# Patient Record
Sex: Male | Born: 1970 | Race: Black or African American | Hispanic: No | Marital: Married | Smoking: Former smoker
Health system: Southern US, Community
[De-identification: ages and names within clinical notes are randomized; demographics above are authoritative.]

## PROBLEM LIST (undated history)

## (undated) DIAGNOSIS — I2510 Atherosclerotic heart disease of native coronary artery without angina pectoris: Secondary | ICD-10-CM

## (undated) DIAGNOSIS — E119 Type 2 diabetes mellitus without complications: Secondary | ICD-10-CM

## (undated) DIAGNOSIS — I214 Non-ST elevation (NSTEMI) myocardial infarction: Secondary | ICD-10-CM

## (undated) DIAGNOSIS — Z9289 Personal history of other medical treatment: Secondary | ICD-10-CM

## (undated) DIAGNOSIS — I255 Ischemic cardiomyopathy: Secondary | ICD-10-CM

## (undated) DIAGNOSIS — I509 Heart failure, unspecified: Secondary | ICD-10-CM

## (undated) DIAGNOSIS — I209 Angina pectoris, unspecified: Secondary | ICD-10-CM

## (undated) DIAGNOSIS — N183 Chronic kidney disease, stage 3 unspecified: Secondary | ICD-10-CM

## (undated) DIAGNOSIS — D649 Anemia, unspecified: Secondary | ICD-10-CM

## (undated) DIAGNOSIS — M109 Gout, unspecified: Secondary | ICD-10-CM

## (undated) DIAGNOSIS — F32A Depression, unspecified: Secondary | ICD-10-CM

## (undated) DIAGNOSIS — I4510 Unspecified right bundle-branch block: Secondary | ICD-10-CM

## (undated) DIAGNOSIS — E782 Mixed hyperlipidemia: Secondary | ICD-10-CM

## (undated) DIAGNOSIS — R0602 Shortness of breath: Secondary | ICD-10-CM

## (undated) DIAGNOSIS — I10 Essential (primary) hypertension: Secondary | ICD-10-CM

## (undated) DIAGNOSIS — F329 Major depressive disorder, single episode, unspecified: Secondary | ICD-10-CM

## (undated) DIAGNOSIS — I739 Peripheral vascular disease, unspecified: Secondary | ICD-10-CM

## (undated) HISTORY — DX: Type 2 diabetes mellitus without complications: E11.9

## (undated) HISTORY — DX: Essential (primary) hypertension: I10

## (undated) HISTORY — DX: Ischemic cardiomyopathy: I25.5

## (undated) HISTORY — DX: Chronic kidney disease, stage 3 (moderate): N18.3

## (undated) HISTORY — DX: Unspecified right bundle-branch block: I45.10

## (undated) HISTORY — DX: Peripheral vascular disease, unspecified: I73.9

## (undated) HISTORY — DX: Chronic kidney disease, stage 3 unspecified: N18.30

## (undated) HISTORY — DX: Atherosclerotic heart disease of native coronary artery without angina pectoris: I25.10

## (undated) HISTORY — DX: Non-ST elevation (NSTEMI) myocardial infarction: I21.4

## (undated) HISTORY — PX: REFRACTIVE SURGERY: SHX103

## (undated) HISTORY — PX: OTHER SURGICAL HISTORY: SHX169

## (undated) HISTORY — DX: Mixed hyperlipidemia: E78.2

## (undated) HISTORY — PX: EYE SURGERY: SHX253

---
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
## 2003-12-13 ENCOUNTER — Emergency Department (HOSPITAL_COMMUNITY): Admission: EM | Admit: 2003-12-13 | Discharge: 2003-12-14 | Payer: Self-pay | Admitting: Emergency Medicine

## 2004-11-16 ENCOUNTER — Other Ambulatory Visit: Admission: RE | Admit: 2004-11-16 | Discharge: 2004-11-16 | Payer: Self-pay | Admitting: General Surgery

## 2006-11-15 ENCOUNTER — Emergency Department (HOSPITAL_COMMUNITY): Admission: EM | Admit: 2006-11-15 | Discharge: 2006-11-15 | Payer: Self-pay | Admitting: Emergency Medicine

## 2006-11-18 ENCOUNTER — Ambulatory Visit (HOSPITAL_COMMUNITY): Admission: RE | Admit: 2006-11-18 | Discharge: 2006-11-18 | Payer: Self-pay | Admitting: Internal Medicine

## 2006-12-06 ENCOUNTER — Ambulatory Visit (HOSPITAL_COMMUNITY): Admission: RE | Admit: 2006-12-06 | Discharge: 2006-12-06 | Payer: Self-pay | Admitting: Internal Medicine

## 2007-01-02 ENCOUNTER — Ambulatory Visit (HOSPITAL_COMMUNITY): Admission: RE | Admit: 2007-01-02 | Discharge: 2007-01-02 | Payer: Self-pay | Admitting: Internal Medicine

## 2007-04-29 ENCOUNTER — Emergency Department (HOSPITAL_COMMUNITY): Admission: EM | Admit: 2007-04-29 | Discharge: 2007-04-29 | Payer: Self-pay | Admitting: Emergency Medicine

## 2007-06-20 ENCOUNTER — Emergency Department (HOSPITAL_COMMUNITY): Admission: EM | Admit: 2007-06-20 | Discharge: 2007-06-20 | Payer: Self-pay | Admitting: Emergency Medicine

## 2007-09-05 IMAGING — XA IR ANGIO/EXT/BI
1 series · 12 of 24 positions shown · non-contrast
Comparison: none

01/02/07 – DUPLICATE COPY for exam association in [facility] – No change from original report.
CLINICAL DATA: Bilateral lower extremity claudication. CTA demonstrated
 bilateral peripheral arterial occlusive disease including proximal left external
 iliac artery focal stenosis

 Bilateral lower extremity arteriogram:
TECHNIQUE: A left-sided approach was selected. Overlying skin prepped with
 Betadine, draped in usual sterile fashion, infiltrated locally with 1%
 lidocaine. Intravenous fentanyl and Versed were administered as conscious
 sedation during continuous cardiorespiratory monitoring by the radiology RN.
 The left common femoral artery was accessed over the femoral head with a
 21-gauge micropuncture needle under real-time ultrasound guidance. Needle was
 exchanged over a 018 wire for a transitional dilator, which allowed advancement
 of a Benson wire to the aorta. Over this, a 5 French C2 catheter was advanced
 for left lower extremity arteriography. Pressure measurements across the left
 external iliac lesion were obtained. The C2 was then utilized via the Benson
 wire to selectively catheterize the right common iliac artery for right lower
 extremity arteriography. The catheter was then removed and hemostasis achieved.
 Patient tolerated procedure well, with no immediate complication.

[Series 1: run · 12 of 98 slices shown]
[im 5/98]
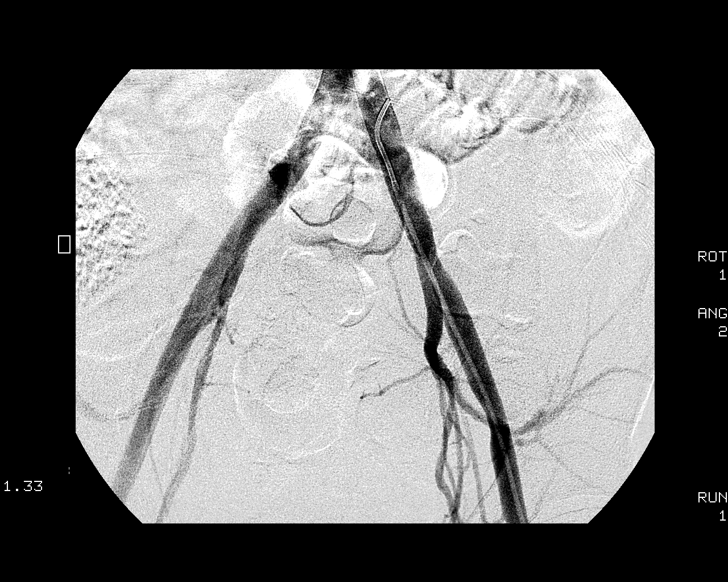
[im 13/98]
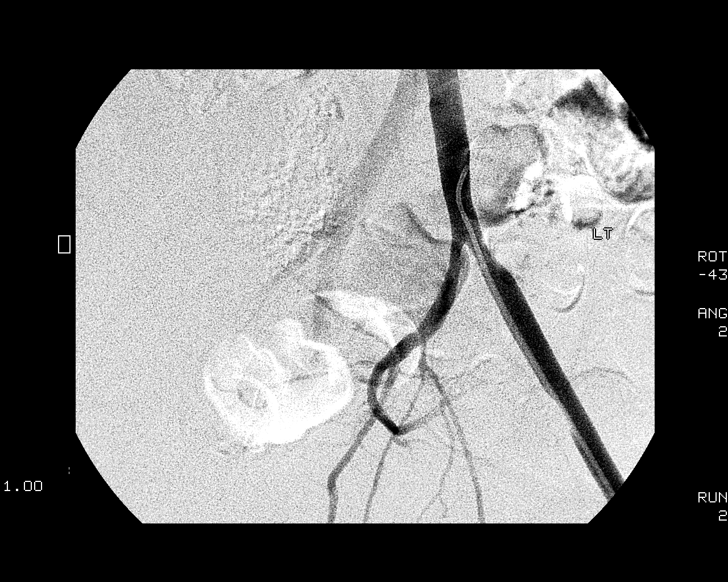
[im 22/98]
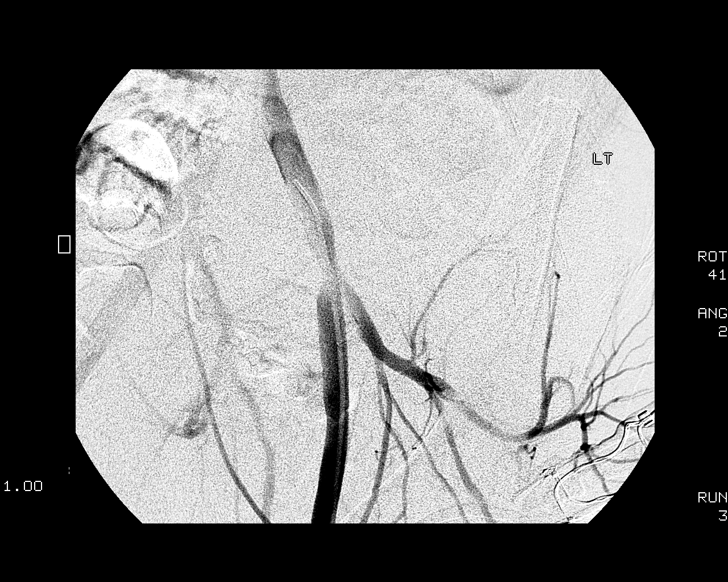
[im 30/98]
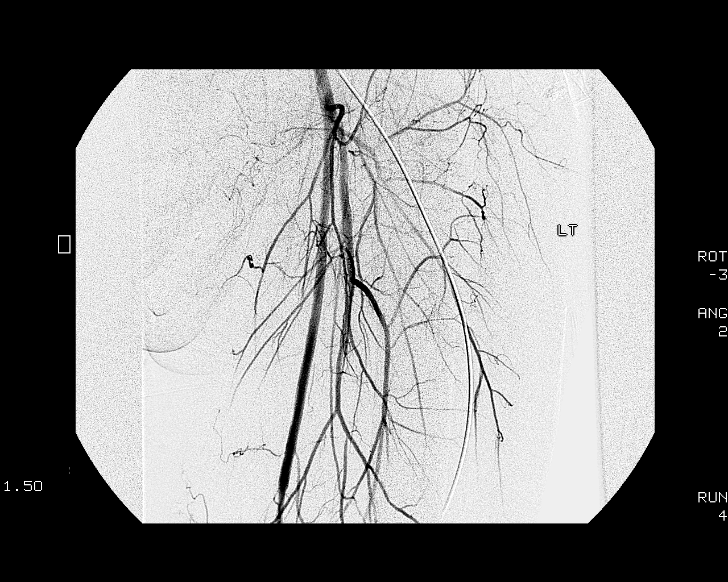
[im 38/98]
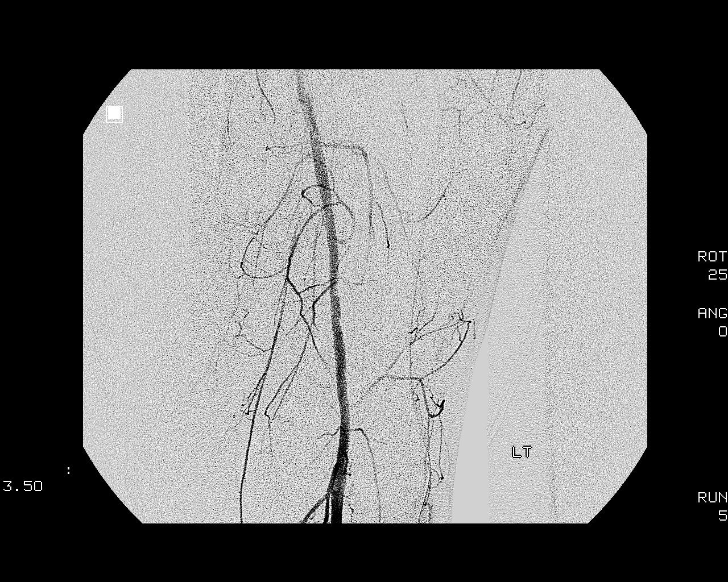
[im 47/98]
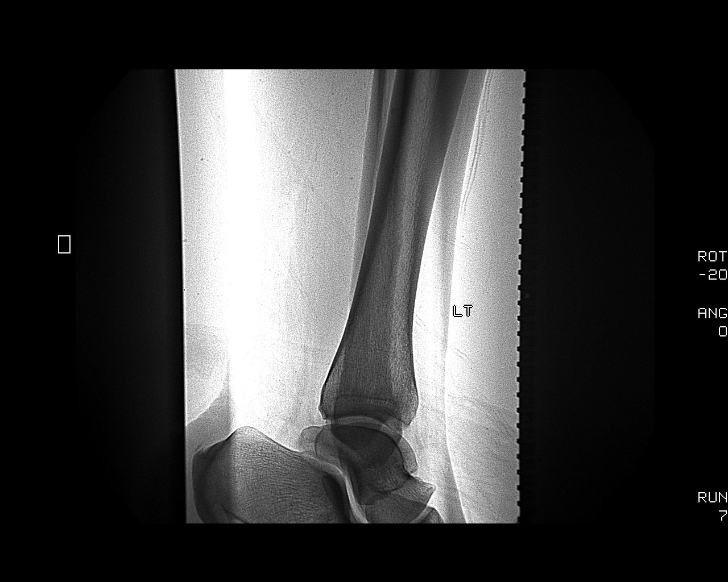
[im 55/98]
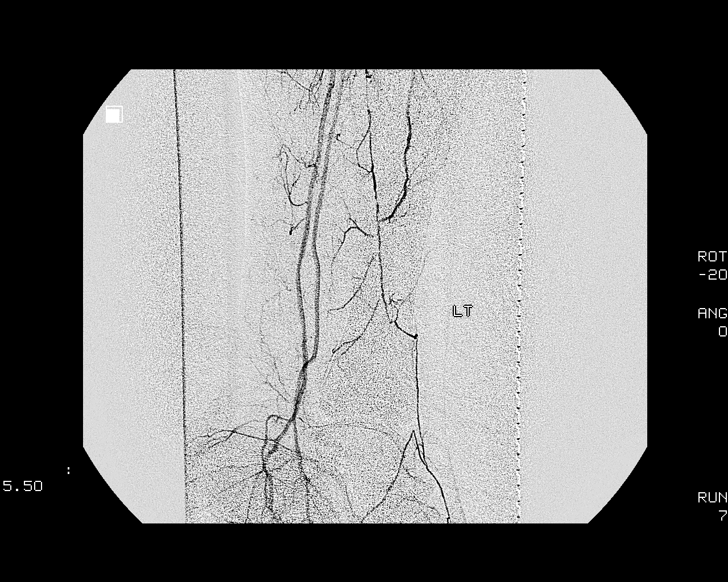
[im 64/98]
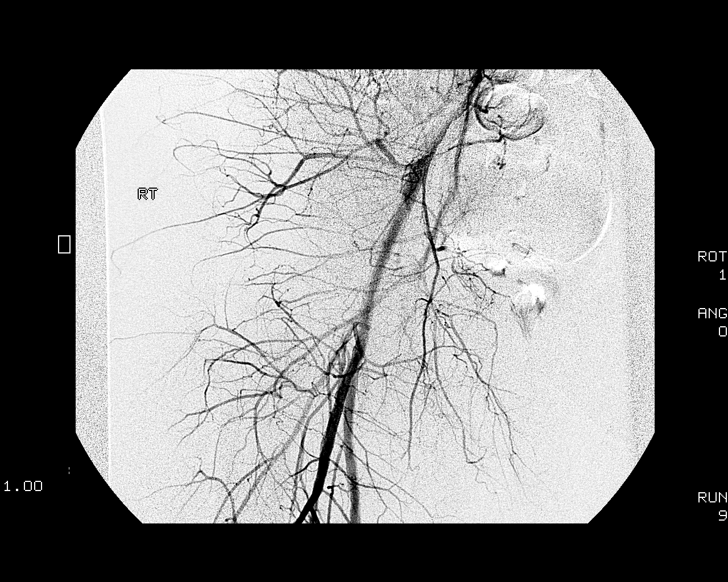
[im 72/98]
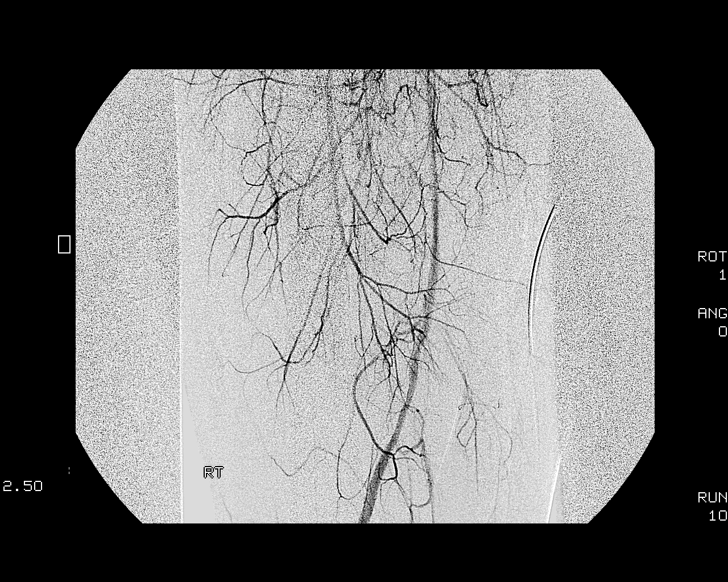
[im 81/98]
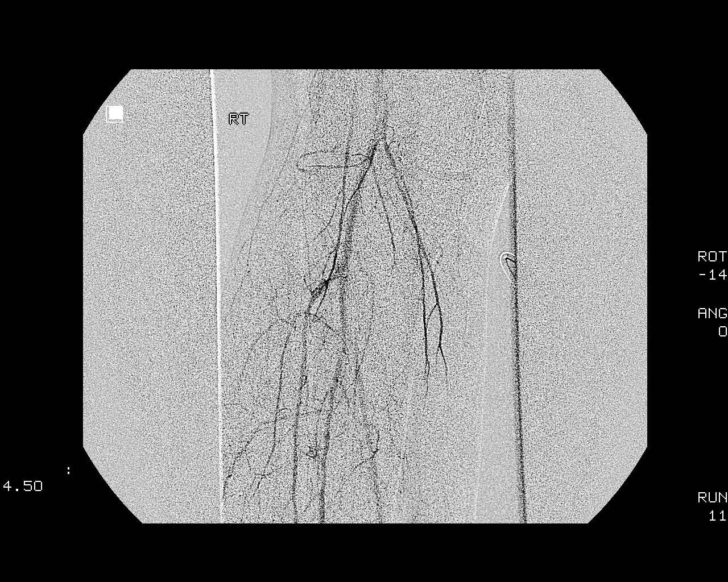
[im 89/98]
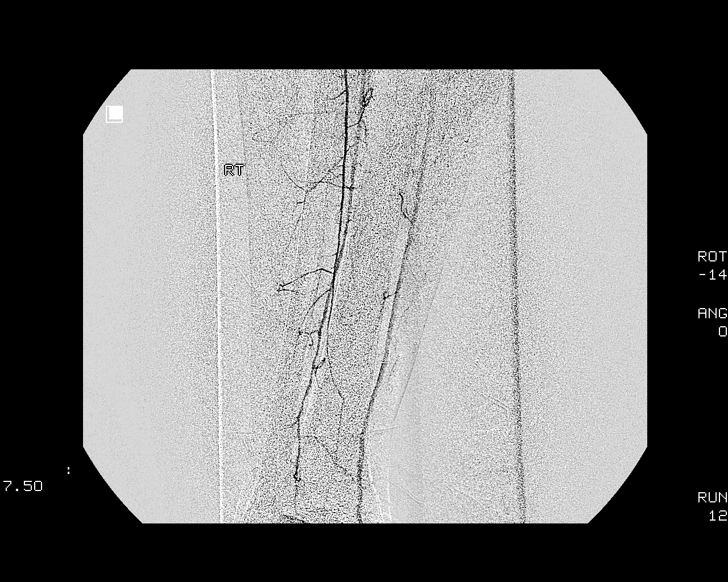
[im 98/98]
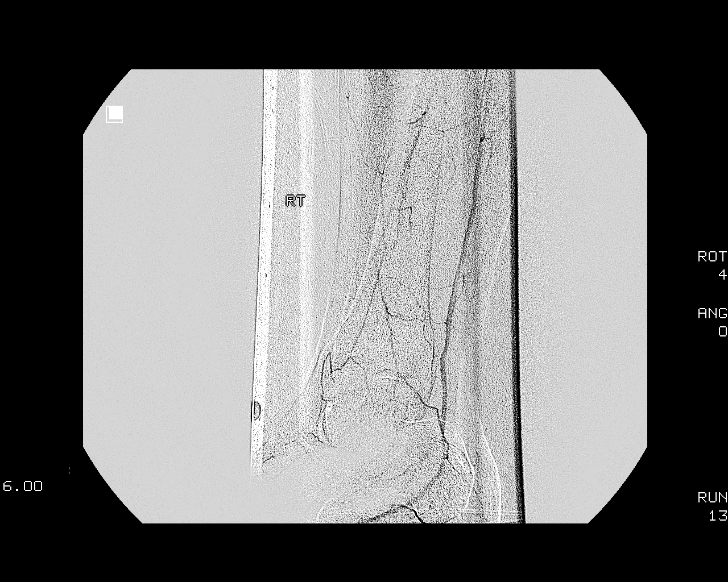

[12 of 24 positions shown; findings below may reference images not displayed]

FINDINGS: On the left, the common iliac artery is unremarkable. There is a 50% diameter
 stenosis at the origin of the left external iliac artery. Pressure
 measurements demonstrate a 3 mmHg systolic pressure gradient. The distal left
 external iliac artery common femoral artery are widely patent. Mild atheromatous
 irregularity in the profunda femoris. There are focal areas of mild narrowing in
 the mid and distal SFA without significant lesion. Popliteal artery is widely
 patent. There is mild atheromatous irregularity in the tibioperoneal trunk and
 proximal peroneal artery. Peroneal and posterior tibial arteries are widely
 patent distally, providing supply across the ankle to the foot. The anterior
 tibial artery is more diminutive, showing atheromatous changes proximally, not
 seen below the lower calf level.

 On the right, common iliac, external iliac, and common femoral arteries are
 widely patent. Mild atheromatous irregularity in the proximal SFA with several
 tandem areas of mild stenosis distally near the adductor hiatus. Popliteal
 artery is widely patent. There is mild diffuse atheromatous irregularity in the
 proximal anterior tibial artery and peroneal artery. The posterior tibial artery
 is widely patent, crossing ankle as the primary blood supply to the foot.
IMPRESSION: 1. Focal left external iliac stenosis with only 3 mmHg systolic pressure
 gradient; intervention is not indicated at this point.
 2. Mild bilateral distal SFA disease without high-grade stenosis.
 3. Diseased bilateral lower extremity tibial runoff with contiguous bilateral
 posterior tibial and left peroneal runoff across the ankles to the feet.

## 2008-01-29 ENCOUNTER — Emergency Department (HOSPITAL_COMMUNITY): Admission: EM | Admit: 2008-01-29 | Discharge: 2008-01-29 | Payer: Self-pay | Admitting: Emergency Medicine

## 2008-02-07 ENCOUNTER — Ambulatory Visit: Payer: Self-pay | Admitting: Cardiology

## 2008-02-22 ENCOUNTER — Encounter: Payer: Self-pay | Admitting: Cardiology

## 2008-02-22 ENCOUNTER — Ambulatory Visit (HOSPITAL_COMMUNITY): Admission: RE | Admit: 2008-02-22 | Discharge: 2008-02-22 | Payer: Self-pay | Admitting: Cardiology

## 2008-02-22 ENCOUNTER — Ambulatory Visit: Payer: Self-pay | Admitting: Cardiology

## 2009-05-27 ENCOUNTER — Ambulatory Visit (HOSPITAL_COMMUNITY): Admission: RE | Admit: 2009-05-27 | Discharge: 2009-05-27 | Payer: Self-pay | Admitting: Internal Medicine

## 2009-07-24 ENCOUNTER — Ambulatory Visit (HOSPITAL_COMMUNITY): Admission: RE | Admit: 2009-07-24 | Discharge: 2009-07-24 | Payer: Self-pay | Admitting: Internal Medicine

## 2009-08-25 ENCOUNTER — Ambulatory Visit (HOSPITAL_COMMUNITY): Admission: RE | Admit: 2009-08-25 | Discharge: 2009-08-25 | Payer: Self-pay | Admitting: Internal Medicine

## 2009-09-02 ENCOUNTER — Encounter (HOSPITAL_COMMUNITY)
Admission: RE | Admit: 2009-09-02 | Discharge: 2009-10-02 | Payer: Self-pay | Source: Home / Self Care | Admitting: Internal Medicine

## 2010-06-17 ENCOUNTER — Other Ambulatory Visit (HOSPITAL_COMMUNITY): Payer: Self-pay | Admitting: *Deleted

## 2010-06-17 DIAGNOSIS — R109 Unspecified abdominal pain: Secondary | ICD-10-CM

## 2010-06-25 ENCOUNTER — Ambulatory Visit (HOSPITAL_COMMUNITY)
Admission: RE | Admit: 2010-06-25 | Discharge: 2010-06-25 | Disposition: A | Discharge status: Home / Self Care | Payer: Self-pay | Source: Ambulatory Visit | Attending: *Deleted | Admitting: *Deleted

## 2010-06-25 ENCOUNTER — Ambulatory Visit (HOSPITAL_COMMUNITY)
Admission: RE | Admit: 2010-06-25 | Discharge: 2010-06-25 | Disposition: A | Discharge status: Home / Self Care | Payer: Self-pay | Source: Ambulatory Visit | Attending: Gastroenterology | Admitting: Gastroenterology

## 2010-06-25 DIAGNOSIS — R109 Unspecified abdominal pain: Secondary | ICD-10-CM

## 2010-06-25 DIAGNOSIS — K824 Cholesterolosis of gallbladder: Secondary | ICD-10-CM | POA: Insufficient documentation

## 2010-06-25 DIAGNOSIS — R10A1 Flank pain, right side: Secondary | ICD-10-CM

## 2010-06-25 DIAGNOSIS — R1031 Right lower quadrant pain: Secondary | ICD-10-CM | POA: Insufficient documentation

## 2010-09-10 ENCOUNTER — Emergency Department (HOSPITAL_COMMUNITY)
Admission: EM | Admit: 2010-09-10 | Discharge: 2010-09-10 | Disposition: A | Discharge status: Home / Self Care | Payer: Self-pay | Attending: Emergency Medicine | Admitting: Emergency Medicine

## 2010-09-10 ENCOUNTER — Emergency Department (HOSPITAL_COMMUNITY): Payer: Self-pay

## 2010-09-10 DIAGNOSIS — E785 Hyperlipidemia, unspecified: Secondary | ICD-10-CM | POA: Insufficient documentation

## 2010-09-10 DIAGNOSIS — E119 Type 2 diabetes mellitus without complications: Secondary | ICD-10-CM | POA: Insufficient documentation

## 2010-09-10 DIAGNOSIS — I10 Essential (primary) hypertension: Secondary | ICD-10-CM | POA: Insufficient documentation

## 2010-09-10 DIAGNOSIS — Z7982 Long term (current) use of aspirin: Secondary | ICD-10-CM | POA: Insufficient documentation

## 2010-09-10 DIAGNOSIS — Z79899 Other long term (current) drug therapy: Secondary | ICD-10-CM | POA: Insufficient documentation

## 2010-09-10 DIAGNOSIS — R110 Nausea: Secondary | ICD-10-CM | POA: Insufficient documentation

## 2010-09-10 DIAGNOSIS — H9319 Tinnitus, unspecified ear: Secondary | ICD-10-CM | POA: Insufficient documentation

## 2010-09-10 DIAGNOSIS — R42 Dizziness and giddiness: Secondary | ICD-10-CM | POA: Insufficient documentation

## 2010-09-10 LAB — POCT I-STAT, CHEM 8
BUN: 22 mg/dL (ref 6–23)
Calcium, Ion: 1.08 mmol/L — ABNORMAL LOW (ref 1.12–1.32)
Chloride: 106 mEq/L (ref 96–112)
Glucose, Bld: 154 mg/dL — ABNORMAL HIGH (ref 70–99)
HCT: 38 % — ABNORMAL LOW (ref 39.0–52.0)
Hemoglobin: 12.9 g/dL — ABNORMAL LOW (ref 13.0–17.0)
Potassium: 4.3 mEq/L (ref 3.5–5.1)
TCO2: 23 mmol/L (ref 0–100)

## 2010-09-10 MED ORDER — GADOBENATE DIMEGLUMINE 529 MG/ML IV SOLN
20.0000 mL | Freq: Once | INTRAVENOUS | Status: AC | PRN
  Administered 2010-09-10: 20 mL via INTRAVENOUS

## 2010-09-22 NOTE — Procedures (Signed)
NAMEBRAYLEN, [name]                  ACCOUNT NO.:  [id number]   MEDICAL RECORD NO.:  [id number]          PATIENT TYPE:  OUT   LOCATION:  RAD                           FACILITY:  [facility]   PHYSICIAN:  Gerrit Friends. Dietrich Pates, MD, FACCDATE OF BIRTH:  1970/05/29   DATE OF PROCEDURE:  02/22/2008  DATE OF DISCHARGE:                                ECHOCARDIOGRAM   [facility]   CLINICAL DATA:  A 40 year old gentleman with multiple cardiovascular  risk factors and palpitations.   1. Treadmill exercise performed to a workload of 7 METS.  A heart rate      only 64% of the patient's age - predicted maximum was achieved.  He      stopped with some leg discomfort, which he characterized as      claudication.   Subsequently, dobutamine was infused at progressively higher doses to  maximum of 40 mcg/kg/minute.  There still was inadequate heart rate  acceleration.  Atropine 0.5 mg was administered intravenously with a  good increase in heart rate to a peak of 165, 91% of the patient's age -  predicted maximum.  He noted malaise and palpitations, but no other  symptoms.  Blood pressure increased from a resting value of 135/80 to  160/70 during drug administration and 180/70 early in recovery.  No  arrhythmias noted.   EKG:  Normal sinus rhythm; borderline left atrial abnormality; right  bundle-branch block; rightward axis.   Stress EKG:  No significant change.   1. Resting echocardiogram:  Normal mitral and aortic valve; normal      left atrial size; normal right ventricular size and function;      normal proximal ascending aorta.  Left ventricular size and      function were normal.  Wall thickness was normal.  There was mild      septal flattening.   Post - exercise echocardiogram:  Decrease in ventricular volume and  increase in contractility in all myocardial segments.   IMPRESSION:  Negative pharmacologic stress echocardiogram revealing  impaired exercise tolerance  during initial attempt at exercise due to  leg discomfort, adequate heart rate increased with pharmacologic stress,  no significant arrhythmias and neither electrocardiographic nor  echocardiographic evidence of myocardial infarction, or ischemia.  Slight septal flattening may indicate a right ventricular volume or  pressure overload.  Other findings as noted.      Gerrit Friends. Dietrich Pates, MD, [facility]  Electronically Signed    RMR/MEDQ  D:  02/22/2008  T:  02/23/2008  Job:  [id number]

## 2010-09-22 NOTE — Assessment & Plan Note (Signed)
[facility]                       [place] CARDIOLOGY OFFICE NOTE   LOVETT, COFFIN                         MRN:          [id number]  DATE:02/07/2008                            DOB:          September 01, 1970    REQUESTING PHYSICIAN:  Bethann Berkshire, MD   PRIMARY CARE:  [facility].   REASON FOR CONSULTATION:  Palpitations and fatigue.   HISTORY OF PRESENT ILLNESS:  Mr. Littlefield is a 40 year old male with limited  history indicating type 2 diabetes mellitus, hypertension, and  hyperlipidemia.  He is on the medical regimen outlined below and reports  compliance with this.  He denies any history of coronary artery disease.  I do see records indicating peripheral arterial disease; however, and  note previous angiographic assessment back in August 2008 with findings  of a focal left external iliac stenosis with only a 3-mm gradient in  systole, mild bilateral distal superficial femoral arterial disease  without high-grade stenosis, and diseased bilateral lower extremity  tibial arteries with decreased runoff.  This was managed medically and  felt not to be significantly improved with revascularization.   Mr. Keisler states that he presented to the emergency department recently  related to progressive fatigue and a sense of palpitations.  He actually  prefaces this with a history of a tooth abscess with extraction and  treatment with penicillin.  He states that he felt he got possibly too  much penicillin and felt sick on his stomach and fatigue for a few  weeks.  In the emergency department, laboratory data showed a normal  troponin-I level and BNP level.  His BUN and creatinine were mildly  increased at 30 and 1.3 with normal potassium of 4.2.  Chest x-ray  report demonstrated no acute findings.  His limited telemetry strips  shows sinus rhythm, and his electrocardiogram from January 29, 2008,  showed sinus rhythm with right bundle-branch block  pattern and single  premature ventricular complex.  Repeat tracing today shows similar  findings with no ectopy.  Mr. Gentile states that he is not having any  further palpitations and generally feels okay except that he does have  perhaps more fatigue with exertion than he did a year ago.  He has not  had any ischemic evaluation.   ALLERGIES:  No known drug allergies.   Present medications include:  1. Aspirin 81 mg p.o. daily.  2. Hydrochlorothiazide 25 mg p.o. daily.  3. Amlodipine 10 mg p.o. daily.  4. Metformin 500 mg p.o. b.i.d.  5. Omega-3 supplements 1200 mg p.o. daily.  6. Lipitor 40 mg p.o. daily.  7. Lisinopril 40 mg p.o. daily.   PAST MEDICAL HISTORY:  As outlined above.  1. He has history of hypertension.  2. Diabetes mellitus.  3. Hyperlipidemia for many years.  4. He has also had laser eye surgery.   SOCIAL HISTORY:  The patient smokes one-half pack per day tobacco.  Denies any alcohol or illicit substance use.  He presently works as a  Copy.   FAMILY HISTORY:  Significant for hypertension and myocardial infarction  in the patient's father  who is still living.   REVIEW OF SYSTEMS:  No frank claudication at this time.  He has had no  exertional angina.  No orthopnea, PND, no dizziness or syncope.  Reports  some mild right posterior flank discomfort.  No hematuria or dysuria.  No history of nephrolithiasis.   PHYSICAL EXAMINATION:  Blood pressure is 126/88, heart rate is 83,  weight is 218 pounds.  An overweight male in no acute distress.  HEENT:  Conjunctiva is normal.  Oropharynx is clear with poor dentition.  NECK:  Supple.  No elevated jugular venous pressure.  No loud bruits or  thyromegaly.  LUNGS:  Clear.  Breathing at rest.  CARDIAC:  Regular rate and rhythm.  No S3, gallop, or pericardial rub.  No pathologic systolic murmur.  ABDOMEN:  Soft and nontender.  Normoactive bowel sounds.  EXTREMITIES:  Exhibit no frank pitting edema.  Distal pulses are  mildly  diminished.  SKIN:  Warm and dry.  MUSCULOSKELETAL:  No kyphosis noted.  NEUROPSYCHIATRIC:  The patient is alert and oriented x3.  Affect is  appropriate.   IMPRESSION AND RECOMMENDATIONS:  History of fatigue with exertion,  although no active chest pain.  He has had palpitations recently and  possibly premature ventricular complexes based on limited telemetry  data.  His baseline electrocardiogram shows a right bundle-branch block  pattern, which is old based on recent tracings.  Cardiac markers during  an emergency department visit earlier in September were normal.  Chest x-  ray was also without acute process.  Mr. Celani despite his young age has  a number of cardiac risk factors and has not undergone any prior cardiac  risk stratification, in fact with previously diagnosed peripheral  arterial disease included.  We discussed this today and thought it would  be most appropriate for him to undergo a basic ischemic evaluation via  an exercise echocardiogram.  If this is a low-risk/normal test, I would  anticipate continued medical therapy and observation.  Otherwise, we can  bring him back  and discuss further evaluation.  Unless his palpitations recur/persist,  we will hold off on any further event recorder assessment at this point.     Jonelle Sidle, MD  Electronically Signed    SGM/MedQ  DD: 02/07/2008  DT: 02/08/2008  Job #: [id number]   cc:   Bethann Berkshire, MD  [facility]

## 2010-09-30 ENCOUNTER — Inpatient Hospital Stay (HOSPITAL_COMMUNITY)
Admission: EM | Admit: 2010-09-30 | Discharge: 2010-10-02 | DRG: 603 | Disposition: A | Discharge status: Home / Self Care | Payer: Self-pay | Attending: Internal Medicine | Admitting: Internal Medicine

## 2010-09-30 DIAGNOSIS — N179 Acute kidney failure, unspecified: Secondary | ICD-10-CM | POA: Diagnosis present

## 2010-09-30 DIAGNOSIS — D649 Anemia, unspecified: Secondary | ICD-10-CM | POA: Diagnosis present

## 2010-09-30 DIAGNOSIS — F17200 Nicotine dependence, unspecified, uncomplicated: Secondary | ICD-10-CM | POA: Diagnosis present

## 2010-09-30 DIAGNOSIS — E119 Type 2 diabetes mellitus without complications: Secondary | ICD-10-CM | POA: Diagnosis present

## 2010-09-30 DIAGNOSIS — L02419 Cutaneous abscess of limb, unspecified: Principal | ICD-10-CM | POA: Diagnosis present

## 2010-09-30 DIAGNOSIS — I10 Essential (primary) hypertension: Secondary | ICD-10-CM | POA: Diagnosis present

## 2010-09-30 DIAGNOSIS — E869 Volume depletion, unspecified: Secondary | ICD-10-CM | POA: Diagnosis present

## 2010-09-30 LAB — URINALYSIS, ROUTINE W REFLEX MICROSCOPIC
Bilirubin Urine: NEGATIVE
Glucose, UA: >1000 mg/dL — AB
Ketones, ur: NEGATIVE mg/dL
Nitrite: NEGATIVE
Specific Gravity, Urine: >1.03 — ABNORMAL HIGH (ref 1.005–1.030)
Urobilinogen, UA: 0.2 mg/dL (ref 0.0–1.0)

## 2010-09-30 LAB — CBC
HCT: 36.2 % — ABNORMAL LOW (ref 39.0–52.0)
Hemoglobin: 12.7 g/dL — ABNORMAL LOW (ref 13.0–17.0)
MCH: 29.6 pg (ref 26.0–34.0)
RBC: 4.29 MIL/uL (ref 4.22–5.81)
RDW: 12.2 % (ref 11.5–15.5)

## 2010-09-30 LAB — URINE MICROSCOPIC-ADD ON

## 2010-09-30 LAB — GLUCOSE, CAPILLARY: Glucose-Capillary: 305 mg/dL — ABNORMAL HIGH (ref 70–99)

## 2010-09-30 LAB — DIFFERENTIAL
Basophils Absolute: 0 10*3/uL (ref 0.0–0.1)
Lymphocytes Relative: 30 % (ref 12–46)

## 2010-09-30 LAB — BASIC METABOLIC PANEL
GFR calc non Af Amer: 46 mL/min — ABNORMAL LOW (ref >60)
Glucose, Bld: 296 mg/dL — ABNORMAL HIGH (ref 70–99)

## 2010-10-01 LAB — GLUCOSE, CAPILLARY
Glucose-Capillary: 214 mg/dL — ABNORMAL HIGH (ref 70–99)
Glucose-Capillary: 271 mg/dL — ABNORMAL HIGH (ref 70–99)
Glucose-Capillary: 251 mg/dL — ABNORMAL HIGH (ref 70–99)

## 2010-10-01 LAB — HEMOGLOBIN A1C
Hgb A1c MFr Bld: 9 % — ABNORMAL HIGH
Mean Plasma Glucose: 212 mg/dL — ABNORMAL HIGH

## 2010-10-01 LAB — DIFFERENTIAL
Basophils Absolute: 0.1 K/uL (ref 0.0–0.1)
Basophils Relative: 1 % (ref 0–1)
Eosinophils Absolute: 0.1 K/uL (ref 0.0–0.7)
Eosinophils Relative: 1 % (ref 0–5)
Lymphocytes Relative: 32 % (ref 12–46)
Lymphs Abs: 2.7 K/uL (ref 0.7–4.0)
Monocytes Absolute: 0.8 K/uL (ref 0.1–1.0)
Monocytes Relative: 9 % (ref 3–12)
Neutro Abs: 4.9 K/uL (ref 1.7–7.7)
Neutrophils Relative %: 57 % (ref 43–77)

## 2010-10-01 LAB — CBC
HCT: 36.2 % — ABNORMAL LOW (ref 39.0–52.0)
Hemoglobin: 12.4 g/dL — ABNORMAL LOW (ref 13.0–17.0)
MCH: 29.2 pg (ref 26.0–34.0)
MCV: 85.2 fL (ref 78.0–100.0)
RBC: 4.25 MIL/uL (ref 4.22–5.81)
WBC: 8.5 10*3/uL (ref 4.0–10.5)

## 2010-10-01 LAB — BASIC METABOLIC PANEL WITH GFR
BUN: 20 mg/dL (ref 6–23)
CO2: 27 meq/L (ref 19–32)
Calcium: 9.6 mg/dL (ref 8.4–10.5)
Chloride: 101 meq/L (ref 96–112)
Creatinine, Ser: 1.52 mg/dL — ABNORMAL HIGH (ref 0.4–1.5)
GFR calc non Af Amer: 51 mL/min — ABNORMAL LOW
Glucose, Bld: 245 mg/dL — ABNORMAL HIGH (ref 70–99)
Potassium: 4.1 meq/L (ref 3.5–5.1)
Sodium: 135 meq/L (ref 135–145)

## 2010-10-01 LAB — T4, FREE: Free T4: 1.13 ng/dL (ref 0.80–1.80)

## 2010-10-01 LAB — MRSA PCR SCREENING: MRSA by PCR: NEGATIVE

## 2010-10-01 LAB — TSH: TSH: 3.43 u[IU]/mL (ref 0.350–4.500)

## 2010-10-02 LAB — COMPREHENSIVE METABOLIC PANEL
Albumin: 2.8 g/dL — ABNORMAL LOW (ref 3.5–5.2)
BUN: 16 mg/dL (ref 6–23)
Calcium: 9.3 mg/dL (ref 8.4–10.5)
Glucose, Bld: 224 mg/dL — ABNORMAL HIGH (ref 70–99)
Sodium: 136 mEq/L (ref 135–145)
Total Protein: 6.3 g/dL (ref 6.0–8.3)

## 2010-10-02 LAB — DIFFERENTIAL
Basophils Absolute: 0.1 10*3/uL (ref 0.0–0.1)
Eosinophils Absolute: 0.1 10*3/uL (ref 0.0–0.7)
Eosinophils Relative: 2 % (ref 0–5)
Lymphocytes Relative: 29 % (ref 12–46)
Monocytes Absolute: 0.4 10*3/uL (ref 0.1–1.0)

## 2010-10-02 LAB — LIPID PANEL
Cholesterol: 240 mg/dL — ABNORMAL HIGH (ref 0–200)
LDL Cholesterol: 175 mg/dL — ABNORMAL HIGH (ref 0–99)

## 2010-10-02 LAB — CBC
HCT: 35.9 % — ABNORMAL LOW (ref 39.0–52.0)
MCHC: 34.3 g/dL (ref 30.0–36.0)
Platelets: 245 10*3/uL (ref 150–400)
RDW: 12 % (ref 11.5–15.5)

## 2010-10-02 LAB — VANCOMYCIN, TROUGH: Vancomycin Tr: 12.3 ug/mL (ref 10.0–20.0)

## 2010-10-02 LAB — GLUCOSE, CAPILLARY: Glucose-Capillary: 204 mg/dL — ABNORMAL HIGH (ref 70–99)

## 2010-10-05 NOTE — Discharge Summary (Signed)
[name], [name]                  ACCOUNT NO.:  [id number]  MEDICAL RECORD NO.:  [id number]           PATIENT TYPE:  I  LOCATION:  A314                          FACILITY:  [facility]  PHYSICIAN:  Elliot Cousin, M.D.    DATE OF BIRTH:  Oct 24, 1970  DATE OF ADMISSION:  09/30/2010 DATE OF DISCHARGE:  05/25/2012LH                              DISCHARGE SUMMARY   DISCHARGE DIAGNOSES: 1. Right thigh cellulitis. 2. Acute renal insufficiency/failure, likely secondary to volume     depletion in the setting of ACE inhibitor and diuretic therapy.     The patient's BUN was 25 and his creatinine was 1.67 on admission.     At the time of discharge, his BUN was 16 and his creatinine was     1.22. 3. Type 2 diabetes mellitus.  The patient's hemoglobin A1c was 9.0. 4. Tobacco abuse.  The patient was advised to quit. 5. Hypertension with relatively low normal blood pressures     during hospitalization. 6. Mild normocytic anemia.  The patient's hemoglobin was 12.3 prior to     discharge.  DISCHARGE MEDICATIONS: 1. Doxycycline 100 mg b.i.d. for 10 more days. 2. Percocet 5/325 mg 1 tablet every 4 hours as needed for pain. 3. Quinapril/HCTZ 20 mg/12.5 mg 1 tablet b.i.d., the dose was     decreased from 2 tablets b.i.d. 4. Amlodipine 10 mg daily. 5. Aspirin 81 mg daily. 6. Fish oil 1000 mg daily. 7. Janumet 50/1000 mg 1 tablet b.i.d. 8. Viagra 100 mg as needed for erectile dysfunction.  DISCHARGE DISPOSITION:  The patient was discharged home in improved and stable condition on Oct 02, 2010.  He will follow up with the nurse practitioner at the [facility] next week.  PROCEDURES PERFORMED:  None.  CONSULTATIONS:  None.  HISTORY OF PRESENT ILLNESS:  The patient is a 39 year old man with a past medical history significant for diabetes mellitus, hypertension, and hyperlipidemia, who presented to the emergency department on Sep 30, 2010, with a chief complaint of right inner thigh pain, swelling,  and redness.  Initially, he noticed a small boil or bump on his right inner thigh.  He popped it.  The pain intensified several days later.  It became warm, red, swollen, and tender.  He denied subjective fever and chills.  In the emergency department, he was noted to be afebrile and hemodynamically stable.  His white blood cell count was within normal limits at 8.6.  He was admitted for further evaluation and management.  HOSPITAL COURSE:  The patient was started empirically on vancomycin. Levaquin was added 24 hours later to treat cover Gram-negative bacteria.  Warm compresses were applied to the area several times daily. His pain was treated with as-needed hydrocodone.  Over the course of the hospitalization, the erythema, induration, edema, and tenderness subsided, but they did not completely resolve before discharge.  He wanted to go home for the [facility] Day weekend and under the circumstances, it appeared reasonable as the cellulitis was resolving on vancomycin and Levaquin.  He remained afebrile.  His white blood cell count remained within normal limits.  He  was discharged on 10 more days of doxycycline.  The patient's blood pressures were normal to low normal throughout the hospitalization.  Quinapril/HCTZ was discontinued due to acute renal failure.  Norvasc was continued.  His creatinine was elevated at 1.67 on admission and following IV fluid hydration, it improved to 1.22.  He was advised to decrease the quinapril from 2 tablets twice daily to 1 tablet twice daily.  He voiced understanding.  His hemoglobin A1c was noted to be 9.0.  Januvia was continued but metformin was discontinued temporarily due to renal insufficiency.  Sliding scale NovoLog was started instead.  For the most part, his capillary blood glucose ranged from 150-200.  At the time of discharge, he was advised to continue Janumet as previously prescribed.  SUBJECTIVE:  The patient is sitting up in bed.  He  wants to go home.  He says that the right thigh pain is less.  There has been no drainage.  OBJECTIVE:  VITAL SIGNS:  Temperature 97, pulse 71, respiratory rate 20, blood pressure 128/84, oxygen saturation 96% on room air. GENERAL:  The patient is a pleasant overweight 40 year old African American man who is sitting up in bed, in no acute distress. HEENT:  Head is normocephalic, nontraumatic.  Pupils equal, round, and reactive to light.  Extraocular movements are intact.  Conjunctivae are clear.  Sclerae are white. LUNGS:  Clear to auscultation bilaterally. HEART:  S1, S2 with no murmurs, rubs, or gallops. ABDOMEN:  Obese, positive bowel sounds, soft, nontender, nondistended. No hepatosplenomegaly, no masses palpated. GU AND RECTAL:  Deferred. EXTREMITIES:  There is a cellulitic indurated area over the medial proximal right thigh.  Compared to yesterday, there is less erythema, less edema, and less induration.  It is still only mildly tender.  No active drainage.  Pedal pulses are palpable bilaterally.  No pretibial edema and no pedal edema. NEUROLOGIC:  The patient is alert and oriented x3.  Cranial nerves II- XII were intact.  LABORATORY DATA:  Sodium 136, potassium 4.7, chloride 102, CO2 29, glucose 224, BUN 16, creatinine 1.22.  Total bilirubin 0.4, alkaline phosphatase 49, SGOT 11, SGPT 10, total protein 6.3, albumin 2.8, calcium 9.3.  WBC 7.1, hemoglobin 12.3, platelet count 245.  Hemoglobin A1c 9.0.  Free T4 1.13, TSH 3.4.  ASSESSMENT AND PLAN:  See above.     Elliot Cousin, M.D.     DF/MEDQ  D:  10/02/2010  T:  10/03/2010  Job:  [id number]  cc:   [facility], [facility]  Electronically Signed by Elliot Cousin M.D. on 10/05/2010 07:17:00 PM

## 2010-10-09 NOTE — H&P (Signed)
[name], [name]                  ACCOUNT NO.:  [id number]  MEDICAL RECORD NO.:  [id number]           PATIENT TYPE:  LOCATION:                                 FACILITY:  PHYSICIAN:  Ricky Kos, MD       DATE OF BIRTH:  [birth year]  DATE OF ADMISSION: DATE OF DISCHARGE:  LH                             HISTORY & PHYSICAL   CHIEF COMPLAINT:  Rash to right inner thigh.  HISTORY OF PRESENT ILLNESS:  [name] is a pleasant 40 year old male, who has diabetes, hypertension, hyperlipidemia, who over the last several days has developed cellulitis in the inner right thigh.  He says this started off as a boil and he actually started a week ago had gotten better and then another one popped up and now he has another one that has worsened over the last several days.  He had a prescription for amoxicillin that he had left over from a previous infection that he started taking 2-3 days ago and this is progressively gotten little worse.  There has been no spontaneous drainage from the area.  He denies any fevers.  He says it is painful.  He has not had any previous infections, otherwise, he has been feeling well.  Denies any nausea, vomiting, or diarrhea.  REVIEW OF SYSTEMS:  Otherwise negative.  PAST MEDICAL HISTORY:  Hypertension, non-insulin-dependent diabetes, hyperlipidemia.  ALLERGIES:  None.  SOCIAL HISTORY:  He is a smoker.  No alcohol.  No IV drug abuse.  His fiancee is currently with him in the hospital room.  MEDICATIONS: 1. Amlodipine 10 mg a day. 2. HCTZ 25 mg daily. 3. Janumet twice a day. 4. Quinapril/HCTZ 20/12.5 twice a day. 5. Viagra as needed.  PHYSICAL EXAMINATION:  VITAL SIGNS:  His temperature is 97.9, blood pressure 157/86, pulse 98, respirations 20, and 100% O2 sats on room air. GENERAL:  He is alert and oriented x4.  No apparent distress. Cooperates fairly. HEART:  Regular rate and rhythm without murmurs, rubs, or gallops. CHEST:  Clear to auscultation  bilaterally.  No wheeze, rhonchi, or rales. ABDOMEN:  Soft, nontender, and nondistended.  Positive bowel sounds.  No hepatosplenomegaly. EXTREMITIES:  No clubbing, cyanosis, or edema. SKIN:  On his right inner thigh, he has got cellulitis with approximate area of induration that measures approximately 7 or 8 cm long, it is oblong shaped.  There is no fluctuation and there is no spontaneous drainage.  It is painful and warm to touch.  In his groin area on the right side, he has some lymphadenopathy present. PSYCH:  Normal mood and affect. NEURO:  No focal neurologic deficits.  LABS:  His urinalysis is significant for specific gravity of over 1.030. Sodium is low 132, glucose is high 296, BUN and creatinine is 25 and 1.67, which is higher than what his baseline as which is normal.  White count is normal.  Hemoglobin is 12.7.  ASSESSMENT AND PLAN:  This is a 40 year old male with a right inner thigh cellulitis. 1. Cellulitis with early abscess.  There is nothing amendable here to     I  and D at this time.  I will place him on vancomycin 1 gram IV     renally dosed for now and place warm compresses to the area.     Hopefully, he will have response to IV antibiotics within the next     24-48 hours.  I will place him on observation for now. 2. Non-insulin-dependent diabetes.  We will continue his home     medications and place him on sliding scale insulin. 3. Hypertension.  We will continue his home medications. 4. Deep vein thrombosis prophylaxis with Lovenox. 5. Acute renal failure, which is mild.  We will place him on IV fluids     overnight and repeat his labs in the morning. 6. The patient is full code.  Further recommendations pending on     overall hospital course.                                           ______________________________ Ricky Kos, MD     RD/MEDQ  D:  09/30/2010  T:  10/01/2010  Job:  [id number]  Electronically Signed by Ricky Kos MD on 10/09/2010  11:47:28 AM

## 2011-02-08 LAB — BASIC METABOLIC PANEL
BUN: 30 — ABNORMAL HIGH
CO2: 25
Chloride: 106
Glucose, Bld: 163 — ABNORMAL HIGH
Potassium: 4.2

## 2011-02-08 LAB — POCT CARDIAC MARKERS: Troponin i, poc: <0.05

## 2011-02-12 LAB — BASIC METABOLIC PANEL
Chloride: 102
Creatinine, Ser: 1.24
GFR calc Af Amer: >60
Potassium: 3.7
Sodium: 137

## 2011-02-12 LAB — CBC
HCT: 40.1
Hemoglobin: 13.6
MCV: 87.4
RBC: 4.59
WBC: 9.1

## 2011-02-12 LAB — POCT CARDIAC MARKERS
CKMB, poc: 2.5
Myoglobin, poc: 250
Myoglobin, poc: 270
Operator id: 208461
Operator id: 215201
Troponin i, poc: <0.05

## 2011-02-12 LAB — DIFFERENTIAL
Basophils Relative: 1
Lymphs Abs: 2.4
Monocytes Absolute: 0.6
Monocytes Relative: 6
Neutro Abs: 6
Neutrophils Relative %: 65

## 2011-02-19 LAB — CBC
MCHC: 34.3
MCV: 88.3
Platelets: 246
RBC: 4.87
RDW: 12.2

## 2011-02-19 LAB — BASIC METABOLIC PANEL
BUN: 22
CO2: 27
Calcium: 9.5
Chloride: 100
Creatinine, Ser: 1.22
Glucose, Bld: 123 — ABNORMAL HIGH

## 2011-02-19 LAB — PROTIME-INR
INR: 0.9
Prothrombin Time: 12.7

## 2011-02-23 LAB — DIFFERENTIAL
Lymphocytes Relative: 37
Monocytes Absolute: 0.5
Monocytes Relative: 7
Neutro Abs: 4

## 2011-02-23 LAB — BASIC METABOLIC PANEL
CO2: 30
Calcium: 9.2
GFR calc Af Amer: >60
GFR calc non Af Amer: >60
Potassium: 4.3
Sodium: 139

## 2011-02-23 LAB — POCT CARDIAC MARKERS
CKMB, poc: 2.3
Troponin i, poc: <0.05

## 2011-02-23 LAB — URINALYSIS, ROUTINE W REFLEX MICROSCOPIC
Bilirubin Urine: NEGATIVE
Glucose, UA: NEGATIVE
Protein, ur: 100 — AB

## 2011-02-23 LAB — CBC
HCT: 37.8 — ABNORMAL LOW
Hemoglobin: 13.4
RBC: 4.36

## 2011-02-23 LAB — URINE MICROSCOPIC-ADD ON

## 2011-03-11 ENCOUNTER — Emergency Department (HOSPITAL_COMMUNITY)
Admission: EM | Admit: 2011-03-11 | Discharge: 2011-03-11 | Disposition: A | Discharge status: Home / Self Care | Payer: Self-pay | Attending: Emergency Medicine | Admitting: Emergency Medicine

## 2011-03-11 ENCOUNTER — Encounter: Payer: Self-pay | Admitting: *Deleted

## 2011-03-11 DIAGNOSIS — E119 Type 2 diabetes mellitus without complications: Secondary | ICD-10-CM | POA: Insufficient documentation

## 2011-03-11 DIAGNOSIS — E7800 Pure hypercholesterolemia, unspecified: Secondary | ICD-10-CM | POA: Insufficient documentation

## 2011-03-11 DIAGNOSIS — F17200 Nicotine dependence, unspecified, uncomplicated: Secondary | ICD-10-CM | POA: Insufficient documentation

## 2011-03-11 DIAGNOSIS — R51 Headache: Secondary | ICD-10-CM | POA: Insufficient documentation

## 2011-03-11 DIAGNOSIS — I10 Essential (primary) hypertension: Secondary | ICD-10-CM | POA: Insufficient documentation

## 2011-03-11 MED ORDER — ACETAMINOPHEN 500 MG PO TABS
1000.0000 mg | ORAL_TABLET | Freq: Once | ORAL | Status: AC
  Administered 2011-03-11: 1000 mg via ORAL
  Filled 2011-03-11: qty 2

## 2011-03-11 MED ORDER — CLONIDINE HCL 0.2 MG PO TABS
0.2000 mg | ORAL_TABLET | Freq: Once | ORAL | Status: AC
  Administered 2011-03-11: 0.2 mg via ORAL
  Filled 2011-03-11: qty 1

## 2011-03-11 MED ORDER — IBUPROFEN 800 MG PO TABS
800.0000 mg | ORAL_TABLET | Freq: Once | ORAL | Status: AC
  Administered 2011-03-11: 800 mg via ORAL
  Filled 2011-03-11: qty 1

## 2011-03-11 NOTE — ED Notes (Signed)
Pt states BP has been 200's/100's all week. Sent here from [facility]. Pt states he has been out of Norvasc for 3-4 mo and just began taking other BP med for a week after being off of that one for several months also. Pt also states headache.

## 2011-03-11 NOTE — ED Notes (Signed)
Pt given d/c instructions. Discussed BP monitoring and taking meds as advised. Pt expressed understanding.

## 2011-03-11 NOTE — ED Provider Notes (Signed)
History   This chart was scribed for Ricky Hutching, MD by Clarita Crane. The patient was seen in room APA11/APA11 and the patient's care was started at 9:59AM.   CSN: [id number] Arrival date & time: 03/11/2011  9:07 AM   First MD Initiated Contact with Patient 03/11/11 [phone]      Chief Complaint  Patient presents with  . Hypertension   HPI [name] is a 40 y.o. male who presents to the Emergency Department after referral from free clinic complaining of constant moderate to severe hypertension onset 1 week ago and persistent since with associated HA and palpitations. Denies chest pain, SOB, cough, numbness, tingling.  Patient states his blood pressure has been measured within the 200's/100's for approximately 1 week. Patient notes he is currently on Quinsinopril to treat hypertension as prescribed by the free clinic and was previously treated with Norvasc but ran out of the medication 3-4 months ago. Patient with a h/o type 2 diabetes, hypertension and hypercholesterolemia.   Past Medical History  Diagnosis Date  . Hypertension   . Diabetes mellitus   . Hypercholesteremia     Past Surgical History  Procedure Date  . Eye surgery   . Arm surgery     No family history on file.  History  Substance Use Topics  . Smoking status: Current Everyday Smoker  . Smokeless tobacco: Not on file  . Alcohol Use: No      Review of Systems 10 Systems reviewed and are negative for acute change except as noted in the HPI.  Allergies  Review of patient's allergies indicates no known allergies.  Home Medications  No current outpatient prescriptions on file.  BP 211/100  Pulse 86  Temp 97.5 F (36.4 C)  Resp 16  Ht 5\' 9"  (1.753 m)  Wt 220 lb (99.791 kg)  BMI 32.49 kg/m2  SpO2 98%  Physical Exam  Nursing note and vitals reviewed. Constitutional: He is oriented to person, place, and time. He appears well-developed and well-nourished. No distress.       Vitals- Hypertensive  HENT:    Head: Normocephalic and atraumatic.  Eyes: EOM are normal. Pupils are equal, round, and reactive to light.  Neck: Neck supple. No tracheal deviation present.  Cardiovascular: Normal rate and regular rhythm.   No murmur heard. Pulmonary/Chest: Effort normal. No respiratory distress. He has no wheezes.  Abdominal: He exhibits no distension.  Musculoskeletal: Normal range of motion. He exhibits no edema.  Neurological: He is alert and oriented to person, place, and time. No sensory deficit.  Skin: Skin is warm and dry.  Psychiatric: He has a normal mood and affect. His behavior is normal.    ED Course  Procedures (including critical care time)  DIAGNOSTIC STUDIES: Oxygen Saturation is 98% on room air, normal by my interpretation.    COORDINATION OF CARE:    Labs Reviewed - No data to display No results found.   No diagnosis found.    MDM   Will order clonidine PO as well as tylenol and motrin to treat HA.     Patient has been out of blood pressure medicine for a couple months. He has no neurological deficits today. Will restart combination ACE inhibitor/diuretic.  [facility].     Ricky Hutching, MD 03/11/11 1214

## 2011-03-11 NOTE — ED Notes (Signed)
Pt states he has recently been taking OTC cough and cold med also.

## 2012-03-27 ENCOUNTER — Inpatient Hospital Stay (HOSPITAL_COMMUNITY)
Admission: AD | Admit: 2012-03-27 | Discharge: 2012-03-29 | DRG: 281 | Disposition: A | Discharge status: Home / Self Care | Payer: PRIVATE HEALTH INSURANCE | Source: Other Acute Inpatient Hospital | Attending: Cardiovascular Disease | Admitting: Cardiovascular Disease

## 2012-03-27 ENCOUNTER — Other Ambulatory Visit: Payer: Self-pay | Admitting: Physician Assistant

## 2012-03-27 ENCOUNTER — Encounter (HOSPITAL_COMMUNITY): Payer: Self-pay | Admitting: *Deleted

## 2012-03-27 DIAGNOSIS — E785 Hyperlipidemia, unspecified: Secondary | ICD-10-CM

## 2012-03-27 DIAGNOSIS — I214 Non-ST elevation (NSTEMI) myocardial infarction: Secondary | ICD-10-CM

## 2012-03-27 DIAGNOSIS — E1129 Type 2 diabetes mellitus with other diabetic kidney complication: Secondary | ICD-10-CM | POA: Diagnosis present

## 2012-03-27 DIAGNOSIS — F17200 Nicotine dependence, unspecified, uncomplicated: Secondary | ICD-10-CM | POA: Diagnosis present

## 2012-03-27 DIAGNOSIS — Z91199 Patient's noncompliance with other medical treatment and regimen due to unspecified reason: Secondary | ICD-10-CM

## 2012-03-27 DIAGNOSIS — I5042 Chronic combined systolic (congestive) and diastolic (congestive) heart failure: Secondary | ICD-10-CM | POA: Diagnosis present

## 2012-03-27 DIAGNOSIS — Z7982 Long term (current) use of aspirin: Secondary | ICD-10-CM

## 2012-03-27 DIAGNOSIS — E1122 Type 2 diabetes mellitus with diabetic chronic kidney disease: Secondary | ICD-10-CM

## 2012-03-27 DIAGNOSIS — I739 Peripheral vascular disease, unspecified: Secondary | ICD-10-CM | POA: Diagnosis present

## 2012-03-27 DIAGNOSIS — Z79899 Other long term (current) drug therapy: Secondary | ICD-10-CM

## 2012-03-27 DIAGNOSIS — I509 Heart failure, unspecified: Secondary | ICD-10-CM | POA: Diagnosis present

## 2012-03-27 DIAGNOSIS — E876 Hypokalemia: Secondary | ICD-10-CM | POA: Diagnosis present

## 2012-03-27 DIAGNOSIS — N189 Chronic kidney disease, unspecified: Secondary | ICD-10-CM | POA: Diagnosis present

## 2012-03-27 DIAGNOSIS — I10 Essential (primary) hypertension: Secondary | ICD-10-CM | POA: Diagnosis present

## 2012-03-27 DIAGNOSIS — N058 Unspecified nephritic syndrome with other morphologic changes: Secondary | ICD-10-CM | POA: Diagnosis present

## 2012-03-27 DIAGNOSIS — Z720 Tobacco use: Secondary | ICD-10-CM

## 2012-03-27 DIAGNOSIS — Z9119 Patient's noncompliance with other medical treatment and regimen: Secondary | ICD-10-CM

## 2012-03-27 DIAGNOSIS — I4510 Unspecified right bundle-branch block: Secondary | ICD-10-CM | POA: Diagnosis present

## 2012-03-27 LAB — GLUCOSE, CAPILLARY: Glucose-Capillary: 175 mg/dL — ABNORMAL HIGH (ref 70–99)

## 2012-03-27 LAB — TROPONIN I: Troponin I: 3.5 ng/mL (ref <0.30)

## 2012-03-27 MED ORDER — NITROGLYCERIN 0.4 MG SL SUBL
0.4000 mg | SUBLINGUAL_TABLET | SUBLINGUAL | Status: DC | PRN
  Administered 2012-03-27 – 2012-03-28 (×5): 0.4 mg via SUBLINGUAL

## 2012-03-27 MED ORDER — GLIMEPIRIDE 4 MG PO TABS
4.0000 mg | ORAL_TABLET | Freq: Every day | ORAL | Status: DC
  Filled 2012-03-27: qty 1

## 2012-03-27 MED ORDER — HEPARIN (PORCINE) IN NACL 100-0.45 UNIT/ML-% IJ SOLN
1750.0000 [IU]/h | INTRAMUSCULAR | Status: DC
  Administered 2012-03-27 – 2012-03-28 (×2): 1300 [IU]/h via INTRAVENOUS
  Filled 2012-03-27 (×4): qty 250

## 2012-03-27 MED ORDER — METOPROLOL TARTRATE 25 MG PO TABS
25.0000 mg | ORAL_TABLET | Freq: Two times a day (BID) | ORAL | Status: DC
  Administered 2012-03-27 – 2012-03-28 (×2): 25 mg via ORAL
  Filled 2012-03-27 (×4): qty 1

## 2012-03-27 MED ORDER — SODIUM CHLORIDE 0.9 % IV SOLN
INTRAVENOUS | Status: DC
  Administered 2012-03-27 – 2012-03-28 (×2): via INTRAVENOUS

## 2012-03-27 MED ORDER — ASPIRIN EC 81 MG PO TBEC: 81.0000 mg | DELAYED_RELEASE_TABLET | Freq: Every day | ORAL | Status: DC

## 2012-03-27 MED ORDER — MORPHINE SULFATE 2 MG/ML IJ SOLN
2.0000 mg | Freq: Once | INTRAMUSCULAR | Status: AC
  Administered 2012-03-27: 2 mg via INTRAVENOUS
  Filled 2012-03-27: qty 1

## 2012-03-27 MED ORDER — SODIUM CHLORIDE 0.9 % IV SOLN: 250.0000 mL | INTRAVENOUS | Status: DC | PRN

## 2012-03-27 MED ORDER — ATORVASTATIN CALCIUM 80 MG PO TABS
80.0000 mg | ORAL_TABLET | Freq: Every day | ORAL | Status: DC
  Filled 2012-03-27: qty 1

## 2012-03-27 MED ORDER — AMLODIPINE BESYLATE 5 MG PO TABS
5.0000 mg | ORAL_TABLET | Freq: Every day | ORAL | Status: DC
  Administered 2012-03-27 – 2012-03-28 (×2): 5 mg via ORAL
  Filled 2012-03-27 (×2): qty 1

## 2012-03-27 MED ORDER — ASPIRIN EC 81 MG PO TBEC
81.0000 mg | DELAYED_RELEASE_TABLET | Freq: Once | ORAL | Status: DC
  Filled 2012-03-27: qty 1

## 2012-03-27 MED ORDER — SODIUM CHLORIDE 0.9 % IJ SOLN
3.0000 mL | Freq: Two times a day (BID) | INTRAMUSCULAR | Status: DC
  Administered 2012-03-28: 3 mL via INTRAVENOUS

## 2012-03-27 MED ORDER — HEPARIN BOLUS VIA INFUSION
4000.0000 [IU] | Freq: Once | INTRAVENOUS | Status: AC
  Administered 2012-03-27: 4000 [IU] via INTRAVENOUS
  Filled 2012-03-27: qty 4000

## 2012-03-27 MED ORDER — SODIUM CHLORIDE 0.9 % IJ SOLN: 3.0000 mL | INTRAMUSCULAR | Status: DC | PRN

## 2012-03-27 MED ORDER — ONDANSETRON HCL 4 MG/2ML IJ SOLN: 4.0000 mg | Freq: Four times a day (QID) | INTRAMUSCULAR | Status: DC | PRN

## 2012-03-27 MED ORDER — SODIUM CHLORIDE 0.9 % IV SOLN
INTRAVENOUS | Status: DC
  Administered 2012-03-27: 19:00:00 via INTRAVENOUS

## 2012-03-27 MED ORDER — INSULIN ASPART 100 UNIT/ML ~~LOC~~ SOLN
0.0000 [IU] | Freq: Three times a day (TID) | SUBCUTANEOUS | Status: DC
  Administered 2012-03-28 – 2012-03-29 (×2): 2 [IU] via SUBCUTANEOUS
  Administered 2012-03-29: 08:00:00 via SUBCUTANEOUS

## 2012-03-27 MED ORDER — ASPIRIN 81 MG PO CHEW
324.0000 mg | CHEWABLE_TABLET | ORAL | Status: AC
  Administered 2012-03-28: 324 mg via ORAL
  Filled 2012-03-27: qty 4

## 2012-03-27 MED ORDER — NITROGLYCERIN 2 % TD OINT
1.0000 [in_us] | TOPICAL_OINTMENT | Freq: Four times a day (QID) | TRANSDERMAL | Status: DC
  Administered 2012-03-27: 1 [in_us] via TOPICAL
  Filled 2012-03-27 (×2): qty 30

## 2012-03-27 MED ORDER — ACETAMINOPHEN 325 MG PO TABS
650.0000 mg | ORAL_TABLET | ORAL | Status: DC | PRN
  Administered 2012-03-27: 650 mg via ORAL
  Filled 2012-03-27: qty 2

## 2012-03-27 NOTE — Progress Notes (Signed)
ANTICOAGULATION CONSULT NOTE - Initial Consult  Pharmacy Consult for heparin Indication: chest pain/ACS  No Known Allergies  Patient Measurements: Wt= 99.8 Ht= 5' 9'' IBW= 70.7kg Heparin dosing wt: 91.6kg  Labs: No results found for this basename: HGB:2,HCT:3,PLT:3,APTT:3,LABPROT:3,INR:3,HEPARINUNFRC:3,CREATININE:3,CKTOTAL:3,CKMB:3,TROPONINI:3 in the last 72 hours  The CrCl is unknown because both a height and weight (above a minimum accepted value) are required for this calculation.   Medical History: Past Medical History  Diagnosis Date  . Hypertension   . Diabetes mellitus   . Hypercholesteremia     Medications:  Prescriptions prior to admission  Medication Sig Dispense Refill  . aspirin EC 81 MG tablet Take 81 mg by mouth daily.        . fish oil-omega-3 fatty acids 1000 MG capsule Take 1 g by mouth daily.        . quinapril (ACCUPRIL) 10 MG tablet Take 20 mg by mouth at bedtime.        . sitaGLIPtan-metformin (JANUMET) 50-1000 MG per tablet Take 1 tablet by mouth 2 (two) times daily with a meal.          Assessment: 41 yo male here with NSTEMI to start heparin.  Goal of Therapy:  Heparin level 0.3-0.7 units/ml Monitor platelets by anticoagulation protocol: Yes   Plan:  -Heparin bolus of 4000 units IV followed by 1300 units/hr (~14 units/kg/hr) -Heparin level in 6 hours and daily wth CBC daily  Harland German, Pharm D 03/27/2012 6:18 PM

## 2012-03-27 NOTE — Progress Notes (Signed)
Pt arrived to unit via [facility]. VSS, pt has no complaints or concerns. Will continue to monitor pt.

## 2012-03-27 NOTE — Progress Notes (Signed)
Pt complaining of increased CP, 5/10. No radiation and sharp. Pt has 1 inch NTG paste. No EKG changes. BP 167/90, 91% RA, HR 76.   2130 one sublingual NTG given.   2135. Pain 1/10. BP 152/83, 98% 2L, HR 83. Dayna Dunn Pa notified. Ordered to give 2mg  morphine. Will continue to monitor.

## 2012-03-27 NOTE — H&P (Signed)
NAME:  [name], THAL [facility] ROOM:   UNIT NUMBER:  [id number] LOCATION: ER ADM/VISIT DATE:  03/27/2012   ADM PHYS: Jaci Carrel, M.D. ACCT:  [id number] DOB: 1971-02-07   CONSULTING CARDIOLOGIST:  Simona Huh, M.D.  REASON FOR CONSULTATION:  NSTEMI.  HISTORY OF PRESENT ILLNESS:  [name] is a 41 year old male with no documented history of CAD, but numerous cardiac risk factors, as well as history of PAD.  The patient presented to the emergency room earlier this morning with a 2 week history of essentially constant chest discomfort, but with a particularly severe episode at approximately 8:30 this morning.  This developed as he was mopping a bathroom, when he felt sharp, severe (10/10) CP with radiation to the left upper extremity, with associated nausea and diaphoresis.  He was taken to the ED where he presented with a blood pressure of 153/95, pulse 66, was afebrile.  He was treated with 4 baby aspirin, 1 NTG tablet and 1 inch nitro paste.  His symptoms significantly improved, although he still maintained some residual discomfort.  Cardiac markers are notable for normal MB, but troponin I of 3.9.  Twelve lead EKG indicated NSR with chronic RBBB and question of an old inferior MI.  The patient has since been started on intravenous heparin.  Of note, the patient was seen by Dr. Diona Browner in 2009, in our clinic, for evaluation of palpitations and fatigue.  He was referred for a stress test, and subsequently had a negative dobutamine stress echocardiogram.  He has not had any subsequent diagnostic testing, and has never undergone cardiac catheterization.  ALLERGIES:  VYTORIN, intolerance (myalgia).  HOME MEDICATIONS: 1. Aspirin 81 every day. 2. Lopressor 25 b.i.d. 3. Lisinopril 5 every day. 4. Norvasc 5 every day. 5. Amaryl 4 every day.  PAST MEDICAL HISTORY: 1. HTN. 2. DM. 3. HLD. 4. Tobacco. 5. RBBB, chronic. 6. CKD. 7. PAD. a. Normal ABI, 2011. b. Focal left EIA stenosis; mild bilateral  distal SFA disease, peripheral angiogram, 2008.  PAST SURGICAL HISTORY: 1. Right wrist surgery. 2. Bilateral retina laser surgery.  SOCIAL HISTORY:  The patient lives here in [place] with his wife.  He works as a Copy.  He has been smoking approximately a pack a day, started at the age of 55.  Has not had alcohol in 10 years.  Negative illicit drug use.  FAMILY HISTORY:  Father age 57, history of MI, the first in his mid-50s, treated medically.  REVIEW OF SYSTEMS:  Exertional dyspnea, fatigue, recent mild peripheral edema, and intermittent claudication.  Denies any symptoms suggestive of reflux disease.  The remaining system reviewed and are negative.  PHYSICAL EXAMINATION:  Vital signs:  Blood pressure currently 157/81, pulse 77, respirations 16, temperature 98.3, sats 97% on 2 liters, weight 225 pounds.  General:  A 41 year old male well-nourished and well-developed lying supine in no distress.  HEENT:  Normocephalic and atraumatic.  PERRLA.  EOMI.  Neck:  Palpable bilateral carotid pulses without bruits.  No JVD at 30 degrees.  Lungs:  Clear to auscultation in all fields.  HEART:  Regular rate and rhythm.  Positive S4.  No significant murmurs, no rubs.  Abdomen:  Soft, intact bowel sounds.  No bruits.  Extremities:  Palpable bilateral femoral pulses with left femoral bruit; minimally palpable PT/DP, trace peripheral edema.  Skin:  Warm and dry.  Musculoskeletal:  No deformity.  Neurological:  No focal deficits.  LABORATORY AND RADIOLOGICAL DATA:  Admission chest x-ray:  No acute changes.  Admission EKG:  NSR at 69 BPM; chronic RBBB; question old MI.  Glucose 158, BUN 26, creatinine 2.14 (GFR 41), sodium 138, potassium 3.3, CO2 is 27.  CPK 269/4.6 (1.7%), troponin I 3.9.  INR 0.9.  BNP 215.  WBC 9.3, hemoglobin 11.1, hematocrit 33 (MCV 86) and platelets 283,000.  IMPRESSION: 1. NSTMI. 2. RBBB, chronic. 3. Multiple cardiac risk  factors. a. HTN. b. DM. c. HLD. d. Tobacco. e. FH. 4. CKD. 5. PAD. 6. Hypokalemia.  PLAN:  Recommendations are to transfer the patient directly to [facility] so as to proceed with diagnostic coronary angiography and possible percutaneous intervention.  However, given that he is currently pain free and in light of underlying chronic kidney disease, plan is to schedule the procedure for tomorrow after hydration.  In the meanwhile, the patient will be treated aggressively with aspirin, beta blockers, nitroglycerin, heparin and a statin.  We will continue cycling cardiac markers, replete potassium, and check follow up labs in the a.m.  The procedure will be done without an LV-gram and we will order a 2D echocardiogram for assessment of LV function.  Also, the patient is followed by Dr. Kristian Covey here in [place], for his CKD, and may require formal renal evaluation, if he develops worsening renal function.  The patient was seen and examined in conjunction with Dr. Diona Browner.   __________________________    Rozell Searing, P.A. Alinda Money D: 03/27/2012 1550 T: 03/27/2012 1620 P: SER2   Attending note:  Patient seen and examined, discussed the case with [name]. Patient is now followed by Dr. Micki Riley and heat and for primary care, has had no cardiac evaluation since 2009 at which time he had a negative dobutamine echocardiogram in the setting of active cardiac risk factors including PAD. He presents now with 2 weeks of prolonged, waxing and waning chest pain, cardiac markers consistent with NSTEMI, possibly late presentation with normal CK-MB. ECG shows no acute ST segment changes with old right bundle branch block, question previous inferior infarct pattern.  On examination he is comfortable, hemodynamically stable in sinus rhythm, has been placed on intravenous heparin, given aspirin and nitroglycerin paste in the ER. Lab work reviewed finding creatinine of 2.1. Patient follows with Dr. Kristian Covey, last creatinine  was 1.8 in August.  After thorough discussion with the patient and his grandmother, plan is for transfer to [facility], admission to the intensive care unit for further stabilization on medical therapy, plan for hydration, and ultimately cardiac catheterization tomorrow to assess for revascularization options. Echocardiogram will be obtained to assess LV function as ventriculogram should be deferred. Depending on status of renal function, may need nephrology consultation. Obviously hold off on ACE inhibitor for now.  Jonelle Sidle, M.D., F.A.C.C.

## 2012-03-27 NOTE — Progress Notes (Addendum)
CRITICAL VALUE ALERT  Critical value received:  Troponin 3.5  Date of notification:  03/27/12  Time of notification:  19:22  Critical value read back:yes  Nurse who received alert:  Nolon Nations   MD notified (1st page):  Ronie Spies, PA   Time of first page:  19:23  MD notified (2nd page):  Time of second page:  Responding MD:  Ronie Spies, PA  Time MD responded:  19:23

## 2012-03-28 ENCOUNTER — Encounter (HOSPITAL_COMMUNITY)
Admission: AD | Disposition: A | Discharge status: Home / Self Care | Payer: Self-pay | Source: Other Acute Inpatient Hospital | Attending: Cardiovascular Disease

## 2012-03-28 ENCOUNTER — Encounter (HOSPITAL_COMMUNITY): Payer: Self-pay | Admitting: Physician Assistant

## 2012-03-28 DIAGNOSIS — E1122 Type 2 diabetes mellitus with diabetic chronic kidney disease: Secondary | ICD-10-CM

## 2012-03-28 DIAGNOSIS — I739 Peripheral vascular disease, unspecified: Secondary | ICD-10-CM

## 2012-03-28 DIAGNOSIS — Z720 Tobacco use: Secondary | ICD-10-CM

## 2012-03-28 DIAGNOSIS — I10 Essential (primary) hypertension: Secondary | ICD-10-CM

## 2012-03-28 DIAGNOSIS — I2510 Atherosclerotic heart disease of native coronary artery without angina pectoris: Secondary | ICD-10-CM

## 2012-03-28 DIAGNOSIS — I214 Non-ST elevation (NSTEMI) myocardial infarction: Secondary | ICD-10-CM

## 2012-03-28 DIAGNOSIS — E785 Hyperlipidemia, unspecified: Secondary | ICD-10-CM

## 2012-03-28 HISTORY — PX: LEFT HEART CATHETERIZATION WITH CORONARY ANGIOGRAM: SHX5451

## 2012-03-28 HISTORY — PX: CARDIAC CATHETERIZATION: SHX172

## 2012-03-28 LAB — TROPONIN I: Troponin I: 5.9 ng/mL (ref <0.30)

## 2012-03-28 LAB — HEMOGLOBIN A1C: Mean Plasma Glucose: 180 mg/dL — ABNORMAL HIGH (ref <117)

## 2012-03-28 LAB — LIPID PANEL: Cholesterol: 320 mg/dL — ABNORMAL HIGH (ref 0–200)

## 2012-03-28 LAB — BASIC METABOLIC PANEL
BUN: 21 mg/dL (ref 6–23)
CO2: 24 mEq/L (ref 19–32)
Chloride: 106 mEq/L (ref 96–112)
Creatinine, Ser: 1.93 mg/dL — ABNORMAL HIGH (ref 0.50–1.35)
Glucose, Bld: 164 mg/dL — ABNORMAL HIGH (ref 70–99)

## 2012-03-28 LAB — GLUCOSE, CAPILLARY
Glucose-Capillary: 102 mg/dL — ABNORMAL HIGH (ref 70–99)
Glucose-Capillary: 97 mg/dL (ref 70–99)

## 2012-03-28 LAB — HEPARIN LEVEL (UNFRACTIONATED): Heparin Unfractionated: 0.11 IU/mL — ABNORMAL LOW (ref 0.30–0.70)

## 2012-03-28 LAB — CBC
Platelets: 266 10*3/uL (ref 150–400)
RDW: 12.3 % (ref 11.5–15.5)
WBC: 10.3 10*3/uL (ref 4.0–10.5)

## 2012-03-28 SURGERY — LEFT HEART CATHETERIZATION WITH CORONARY ANGIOGRAM
Anesthesia: LOCAL

## 2012-03-28 MED ORDER — ACETAMINOPHEN 325 MG PO TABS: 650.0000 mg | ORAL_TABLET | ORAL | Status: DC | PRN

## 2012-03-28 MED ORDER — ONDANSETRON HCL 4 MG/2ML IJ SOLN: 4.0000 mg | Freq: Four times a day (QID) | INTRAMUSCULAR | Status: DC | PRN

## 2012-03-28 MED ORDER — HEPARIN BOLUS VIA INFUSION
2000.0000 [IU] | Freq: Once | INTRAVENOUS | Status: AC
  Administered 2012-03-28: 2000 [IU] via INTRAVENOUS
  Filled 2012-03-28: qty 2000

## 2012-03-28 MED ORDER — CLOPIDOGREL BISULFATE 75 MG PO TABS
600.0000 mg | ORAL_TABLET | Freq: Once | ORAL | Status: AC
  Administered 2012-03-28: 600 mg via ORAL
  Filled 2012-03-28: qty 8

## 2012-03-28 MED ORDER — MIDAZOLAM HCL 2 MG/2ML IJ SOLN
INTRAMUSCULAR | Status: AC
  Filled 2012-03-28: qty 2

## 2012-03-28 MED ORDER — HEPARIN (PORCINE) IN NACL 2-0.9 UNIT/ML-% IJ SOLN
INTRAMUSCULAR | Status: AC
  Filled 2012-03-28: qty 500

## 2012-03-28 MED ORDER — METOPROLOL TARTRATE 1 MG/ML IV SOLN
5.0000 mg | INTRAVENOUS | Status: AC | PRN
  Administered 2012-03-28 (×3): 5 mg via INTRAVENOUS
  Filled 2012-03-28: qty 15

## 2012-03-28 MED ORDER — VERAPAMIL HCL 2.5 MG/ML IV SOLN
INTRAVENOUS | Status: AC
  Filled 2012-03-28: qty 2

## 2012-03-28 MED ORDER — CLOPIDOGREL BISULFATE 75 MG PO TABS
75.0000 mg | ORAL_TABLET | Freq: Every day | ORAL | Status: DC
  Administered 2012-03-29: 75 mg via ORAL
  Filled 2012-03-28: qty 1

## 2012-03-28 MED ORDER — LIDOCAINE HCL (PF) 1 % IJ SOLN
INTRAMUSCULAR | Status: AC
  Filled 2012-03-28: qty 30

## 2012-03-28 MED ORDER — FENTANYL CITRATE 0.05 MG/ML IJ SOLN
INTRAMUSCULAR | Status: AC
  Filled 2012-03-28: qty 2

## 2012-03-28 MED ORDER — HEPARIN (PORCINE) IN NACL 2-0.9 UNIT/ML-% IJ SOLN
INTRAMUSCULAR | Status: AC
  Filled 2012-03-28: qty 1000

## 2012-03-28 MED ORDER — NITROGLYCERIN IN D5W 200-5 MCG/ML-% IV SOLN
2.0000 ug/min | INTRAVENOUS | Status: DC
  Administered 2012-03-28: 5 ug/min via INTRAVENOUS
  Filled 2012-03-28: qty 250

## 2012-03-28 MED ORDER — NITROGLYCERIN 0.2 MG/ML ON CALL CATH LAB
INTRAVENOUS | Status: AC
  Filled 2012-03-28: qty 1

## 2012-03-28 MED ORDER — SODIUM CHLORIDE 0.9 % IV SOLN: INTRAVENOUS | Status: AC

## 2012-03-28 MED FILL — Morphine Sulfate Inj PF 0.5 MG/ML: INTRAMUSCULAR | Qty: 2 | Status: AC

## 2012-03-28 MED FILL — Heparin Sodium (Porcine) 100 Unt/ML in Sodium Chloride 0.45%: INTRAMUSCULAR | Qty: 250 | Status: AC

## 2012-03-28 NOTE — CV Procedure (Signed)
   Cardiac Catheterization Operative Report  [name] [id number] 11/19/20134:55 PM Provider Not In System  Procedure Performed:  1. Left Heart Catheterization 2. Selective Coronary Angiography 3. Left ventricular pressures  Primary Cardiologist: Dr. Diona Browner  Operator: Verne Carrow, MD  Indication:   41 yo AAM with history of DM, HTN, HLD, tobacco abuse, obesity and medical non-compliance admitted with NSTEMI on transfer from [facility].                                Procedure Details: The risks, benefits, complications, treatment options, and expected outcomes were discussed with the patient. The patient and/or family concurred with the proposed plan, giving informed consent. The patient was brought to the cath lab after IV hydration was begun and oral premedication was given. The patient was further sedated with Versed and Fentanyl. We attempted access into the left radial artery but were unsuccessful. The right radial was not used secondary to prior surgery with large area of scar tissue across the right wrist. The right groin was prepped and draped in the usual manner. Using the modified Seldinger access technique, a 5 French sheath was placed in the right femoral artery. Standard diagnostic catheters were used to perform selective coronary angiography. A pigtail catheter was used to measure left ventricular pressures.  There were no immediate complications. The patient was taken to the recovery area in stable condition.   Hemodynamic Findings: Central aortic pressure: 150/100 Left ventricular pressure: 148/17/21  Angiographic Findings:  Left main: No obstructive disease.   Left Anterior Descending Artery: Large vessel that coursed to the apex. The proximal and mid vessel had serial 20% lesions. The distal vessel had 100% occlusion just before the apex. The vessel was 1.75 mm in this location. The diagonal branch was small in caliber and patent with mild diffuse  disease.   Circumflex Artery: Large caliber vessel with large first obtuse marginal branch. There is a 30% stenosis in the proximal portion of the obtuse marginal branch. The distal segment of the OM branch is 100% occluded.   Right Coronary Artery: Large, dominant vessel with 40% proximal stenosis, long tubular 40% mid stenosis, serial 25% distal stenoses. The PDA is very small caliber and has diffuse 80% stenosis. The Posterolateral branch is small in caliber and has diffuse 50% stenosis.   Left Ventricular Angiogram: Deferred.   Impression: 1. 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 No focal lesions for PCI 2. NSTEMI secondary to above.   Recommendations: Will continue medical management with aggressive risk factor reduction. He will need to stop smoking. We will continue statin, beta blocker. Will continue ASA and will load with Plavix. Will check echo to assess LVEF.        Complications:  None. The patient tolerated the procedure well.

## 2012-03-28 NOTE — Progress Notes (Signed)
0100: RN notified of pt complaining of 6/10 chest pain, radiating to left arm. Pt has NTG paste to chest. BP 152/89, 97% 2L, Hr 82. No ekg changes. x1 NTG given at 0109.  0114: pain 6/10. BP 155/85, 100% 2L.. x2 sublingual NTG given.   0120: pain 6/10. BP 148/82, 100% 2L. x3 sublingual NTG given.   0128 pain 6/10. Whitlock MD notified. Ordered to start pt on NTG gtt and titrate as needed and Lopressor 5mg  IV q52min, x3 doses ordered for BP.   0235: pain 1/10. BP 143/78. 100% 2L. NTG gtt titrated to , 15mg  IV lopressor given. MD made aware of pain level. MD states they are fine with pt pain level 1/10. Will continue to monitor.

## 2012-03-28 NOTE — Progress Notes (Signed)
 Patient Name: [name] Date of Encounter: 03/28/2012     Principal Problem:  *NSTEMI (non-ST elevated myocardial infarction) Active Problems:  Hyperlipidemia  Type 2 diabetes mellitus with diabetic chronic kidney disease  PAD (peripheral artery disease)  Tobacco abuse  HTN (hypertension)    SUBJECTIVE: Patient transferred from [facility] earlier this AM for NSTEMI. Currently endorses 1/10 chest pain, much improved from earlier this AM.    OBJECTIVE  Filed Vitals:   03/28/12 0233 03/28/12 0236 03/28/12 0311 03/28/12 0500  BP: 136/76 143/78 148/76 143/75  Pulse:    79  Temp:    98.2 F (36.8 C)  TempSrc:    Oral  Resp:    20  Height:      Weight:      SpO2:  100%  96%    Intake/Output Summary (Last 24 hours) at 03/28/12 0827 Last data filed at 03/28/12 0817  Gross per 24 hour  Intake      0 ml  Output      0 ml  Net      0 ml   Weight change:   PHYSICAL EXAM  General: ell developed, well nourished, in no acute distress. Head:  Normocephalic, atraumatic, sclera non-icteric, no xanthomas, nares are without discharge.  Neck: Supple without bruits or JVD. Lungs:  Resp regular and unlabored, CTA. Heart: RRR no s3, s4, or murmurs. Abdomen: Soft, non-tender, non-distended, BS + x 4.  Msk:  Strength and tone appears normal for age. Extremities: No femoral bruits appreciated. No clubbing, cyanosis or edema. DP/PT/Radials faint-1+ and equal bilaterally. Neuro: Alert and oriented X 3. Moves all extremities spontaneously. Psych: Normal affect.  LABS:  Recent Labs  Basename 03/28/12 0603   WBC 10.3   HGB 10.3*   HCT 30.5*   MCV 84.5   PLT 266   Lab 03/28/12 0603  NA 139  K 3.5  CL 106  CO2 24  BUN 21  CREATININE 1.93*  CALCIUM 8.3*  PROT --  BILITOT --  ALKPHOS --  ALT --  AST --  AMYLASE --  LIPASE --  GLUCOSE 164*   Recent Labs  Basename 03/28/12 0603 03/28/12 0002 03/27/12 1824   CKTOTAL -- -- --   CKMB -- -- --   CKMBINDEX --  -- --   TROPONINI 6.01* 5.90* 3.50*   Recent Labs  Basename 03/28/12 0603   CHOL 320*   HDL 33*   LDLCALC 224*   TRIG 314*   CHOLHDL 9.7   LDLDIRECT --   TELE: NSR,   ECG: NSR, RBBB, 77 bpm, no unchanged TWIs III, aVF, no ST changes   Radiology/Studies:  No results found.  Current Medications:     . amLODipine  5 mg Oral Daily  . [COMPLETED] aspirin  324 mg Oral Pre-Cath  . aspirin EC  81 mg Oral Daily  . aspirin EC  81 mg Oral Once  . atorvastatin  80 mg Oral q1800  . glimepiride  4 mg Oral Q breakfast  . [COMPLETED] heparin  2,000 Units Intravenous Once  . [COMPLETED] heparin  4,000 Units Intravenous Once  . insulin aspart  0-9 Units Subcutaneous TID WC  . metoprolol tartrate  25 mg Oral BID  . [COMPLETED]  morphine injection  2 mg Intravenous Once  . sodium chloride  3 mL Intravenous Q12H  . [DISCONTINUED] nitroGLYCERIN  1 inch Topical Q6H    ASSESSMENT AND PLAN:  1. NSTEMI/CAD- patient presented to [facility] yesterday after   c/o a worsening 2 week history of substernal constant chest pain. He worsened acutely (10/10) yesterday, thus prompting his ED presentation. He has significant cardiac risk factors in uncontrolled DM, hyperlipidemia (LDL 225), uncontrolled HTN, ongoing tobacco abuse and family history of CAD. Troponin trend 3.50>5.90>6.01. Currently heparinized, on NTG gtt, ASA, BB, high dose atorvastatin and Norvasc. Being hydrated currently. Limiting factor for cath has been renal function. Noted Cr 1.8 in 12/2011. 2.1 at [facility], and currently 1.93. After discussing with MD, will plan to hydrate this AM and cath this afternoon with limited contrast. Defer LV-gram. Keep NPO. Will place pre-cath orders. Echo to assess LV function.   2. PAD- patient endorses convincing claudication symptoms (walks 10 feet before experiencing severe aching bilateral calf pain which relieves with rest). Will need bilateral ABIs +/- angiography +/- intervention.   3. Type 2 DM  with diabetic nephropathy- will check A1C. Continue SSI and glimepiride. Patient has an appointment with a nephrologist next month where further work-up as to underlying CKD etiology can be determined (likely diabetes and hypertension sequela). Hydrating currently to prime kidney for contrast-exposure pre-cath.   4. Hyperlipidemia- LDL 224, HDL 33, TG 314, TC 320. Continue high-dose atorvastatin.   5. Hypertension, uncontrolled- unclear as to whether hypertension is contributing to CKD or vice versa. This will be further evaluated as an outpatient. Will need aggressive control. Consider up-titrating Lopressor given underlying CAD. If further BP control needed, adjusting Norvasc may be more effective.   6. Tobacco abuse- 29 pack-year history. Smoking cessation highly stressed. Will offer nicotine replacement as assistance.     Signed, R. Tony Arguello, PA-C 03/28/2012, 8:27 AM.  I have personally seen and examined this patient with Tony Arguello, PA-C. I agree with the assessment and plan as outlined above. Plans for left heart cath today after hydration. Discussed risk of contrast nephropathy as well as other risks of cath including bleeding/infection/MI/CVA/death with pt and family. Smoking cessation and compliance with diabetic diet encouraged.   Nanna Ertle\3:23 PM 03/28/2012  

## 2012-03-28 NOTE — Progress Notes (Signed)
[facility] Cross cover  Called due to recurrent chest pain, rising troponins.  Briefly, 41 yo AA M with PMH sig for HTN and ?PAD presenting with several weeks of progressive chest pain with acute worsening yesterday and presenting to [facility] hospital. Elevated troponins, EKG with RBBB and inf q waves but no dynamic changes.  Initially treated with heparin gtt, BB and nitro paste but continued to have episodic chest pain (5/10), relieved with SL nitro and intermittent nitro. However, at 1:00 am, had chest pain unrelieved with SL nitro x 3. EKG without changes. Repeat troponin drawn earlier rising from 3.5 to 5.9.   Seen and evaluated. BP elevated at 143/67, p 85. Resting in bed. Reports 4-5/10 chest pain. Will treat with titratable nitro gtt and metoprolol IV 5 mg x 3 q5. If unable to relieve chest pain with these measures, will consider need for urgent catheterization. Currently being hydrated due to elevated Cr.

## 2012-03-28 NOTE — H&P (View-Only) (Signed)
Patient Name: [name] Date of Encounter: 03/28/2012     Principal Problem:  *NSTEMI (non-ST elevated myocardial infarction) Active Problems:  Hyperlipidemia  Type 2 diabetes mellitus with diabetic chronic kidney disease  PAD (peripheral artery disease)  Tobacco abuse  HTN (hypertension)    SUBJECTIVE: Patient transferred from [facility] hospital earlier this AM for NSTEMI. Currently endorses 1/10 chest pain, much improved from earlier this AM.    OBJECTIVE  Filed Vitals:   03/28/12 0233 03/28/12 0236 03/28/12 0311 03/28/12 0500  BP: 136/76 143/78 148/76 143/75  Pulse:    79  Temp:    98.2 F (36.8 C)  TempSrc:    Oral  Resp:    20  Height:      Weight:      SpO2:  100%  96%    Intake/Output Summary (Last 24 hours) at 03/28/12 0827 Last data filed at 03/28/12 0817  Gross per 24 hour  Intake      0 ml  Output      0 ml  Net      0 ml   Weight change:   PHYSICAL EXAM  General: ell developed, well nourished, in no acute distress. Head:  Normocephalic, atraumatic, sclera non-icteric, no xanthomas, nares are without discharge.  Neck: Supple without bruits or JVD. Lungs:  Resp regular and unlabored, CTA. Heart: RRR no s3, s4, or murmurs. Abdomen: Soft, non-tender, non-distended, BS + x 4.  Msk:  Strength and tone appears normal for age. Extremities: No femoral bruits appreciated. No clubbing, cyanosis or edema. DP/PT/Radials faint-1+ and equal bilaterally. Neuro: Alert and oriented X 3. Moves all extremities spontaneously. Psych: Normal affect.  LABS:  Recent Labs  [facility] 03/28/12 0603   WBC 10.3   HGB 10.3*   HCT 30.5*   MCV 84.5   PLT 266   Lab 03/28/12 0603  NA 139  K 3.5  CL 106  CO2 24  BUN 21  CREATININE 1.93*  CALCIUM 8.3*  PROT --  BILITOT --  ALKPHOS --  ALT --  AST --  AMYLASE --  LIPASE --  GLUCOSE 164*   Recent Labs  Basename 03/28/12 0603 03/28/12 0002 03/27/12 1824   CKTOTAL -- -- --   CKMB -- -- --   CKMBINDEX --  -- --   TROPONINI 6.01* 5.90* 3.50*   Recent Labs  Basename 03/28/12 0603   CHOL 320*   HDL 33*   LDLCALC 224*   TRIG 314*   CHOLHDL 9.7   LDLDIRECT --   TELE: NSR,   ECG: NSR, RBBB, 77 bpm, no unchanged TWIs III, aVF, no ST changes   Radiology/Studies:  No results found.  Current Medications:     . amLODipine  5 mg Oral Daily  . [COMPLETED] aspirin  324 mg Oral Pre-Cath  . aspirin EC  81 mg Oral Daily  . aspirin EC  81 mg Oral Once  . atorvastatin  80 mg Oral q1800  . glimepiride  4 mg Oral Q breakfast  . [COMPLETED] heparin  2,000 Units Intravenous Once  . [COMPLETED] heparin  4,000 Units Intravenous Once  . insulin aspart  0-9 Units Subcutaneous TID WC  . metoprolol tartrate  25 mg Oral BID  . [COMPLETED]  morphine injection  2 mg Intravenous Once  . sodium chloride  3 mL Intravenous Q12H  . [DISCONTINUED] nitroGLYCERIN  1 inch Topical Q6H    ASSESSMENT AND PLAN:  1. NSTEMI/CAD- patient presented to [facility] hospital yesterday after  c/o a worsening 2 week history of substernal constant chest pain. He worsened acutely (10/10) yesterday, thus prompting his ED presentation. He has significant cardiac risk factors in uncontrolled DM, hyperlipidemia (LDL 225), uncontrolled HTN, ongoing tobacco abuse and family history of CAD. Troponin trend 3.50>5.90>6.01. Currently heparinized, on NTG gtt, ASA, BB, high dose atorvastatin and Norvasc. Being hydrated currently. Limiting factor for cath has been renal function. Noted Cr 1.8 in 12/2011. 2.1 at [facility], and currently 1.93. After discussing with MD, will plan to hydrate this AM and cath this afternoon with limited contrast. Defer LV-gram. Keep NPO. Will place pre-cath orders. Echo to assess LV function.   2. PAD- patient endorses convincing claudication symptoms (walks 10 feet before experiencing severe aching bilateral calf pain which relieves with rest). Will need bilateral ABIs +/- angiography +/- intervention.   3. Type 2 DM  with diabetic nephropathy- will check A1C. Continue SSI and glimepiride. Patient has an appointment with a nephrologist next month where further work-up as to underlying CKD etiology can be determined (likely diabetes and hypertension sequela). Hydrating currently to prime kidney for contrast-exposure pre-cath.   4. Hyperlipidemia- LDL 224, HDL 33, TG 314, TC 320. Continue high-dose atorvastatin.   5. Hypertension, uncontrolled- unclear as to whether hypertension is contributing to CKD or vice versa. This will be further evaluated as an outpatient. Will need aggressive control. Consider up-titrating Lopressor given underlying CAD. If further BP control needed, adjusting Norvasc may be more effective.   6. Tobacco abuse- 29 pack-year history. Smoking cessation highly stressed. Will offer nicotine replacement as assistance.     Signed, R. Hurman Horn, PA-C 03/28/2012, 8:27 AM.  I have personally seen and examined this patient with Hurman Horn, PA-C. I agree with the assessment and plan as outlined above. Plans for left heart cath today after hydration. Discussed risk of contrast nephropathy as well as other risks of cath including bleeding/infection/MI/CVA/death with pt and family. Smoking cessation and compliance with diabetic diet encouraged.   [name]\3:23 PM 03/28/2012

## 2012-03-28 NOTE — Progress Notes (Signed)
Critical Lab value: troponin 5.9  [place] MD notified. No new orders. Will continue to monitor.

## 2012-03-28 NOTE — Progress Notes (Signed)
ANTICOAGULATION CONSULT NOTE - Initial Consult  Pharmacy Consult for heparin Indication: chest pain/ACS  Allergies  Allergen Reactions  . Vytorin (Ezetimibe-Simvastatin)     Weakness, muscle aches bad.    Patient Measurements: Wt= 99.8 Ht= 5' 9'' IBW= 70.7kg Heparin dosing wt: 91.6kg  Labs:  Basename 03/28/12 0603 03/28/12 0002 03/27/12 1824  HGB 10.3* -- --  HCT 30.5* -- --  PLT 266 -- --  APTT -- -- --  LABPROT -- -- --  INR -- -- --  HEPARINUNFRC 0.11* -- --  CREATININE -- -- --  CKTOTAL -- -- --  CKMB -- -- --  TROPONINI -- 5.90* 3.50*    Estimated Creatinine Clearance: 92.8 ml/min (by C-G formula based on Cr of 1.22).  Assessment: 41 yo male with NSTEMI for heparin.  Goal of Therapy:  Heparin level 0.3-0.7 units/ml Monitor platelets by anticoagulation protocol: Yes   Plan:  Heparin 2000 units IV bolus, then increase heparin 1750 units/hr F/U after cath  Geannie Risen, PharmD, BCPS   03/28/2012 6:39 AM

## 2012-03-28 NOTE — Progress Notes (Signed)
Pt. requested bible and visit.  I took bible and provided listening, guidance, reading and ministry of presence to pt.and wife at bedside.   Pt. Preparing for surgery in Cath Lab later on today.  Will follow as needed.    03/28/12 1200  Clinical Encounter Type  Visited With Patient and family together;Health care provider  Visit Type Spiritual support;Pre-op  Consult/Referral To Nurse  Spiritual Encounters  Spiritual Needs Prayer;Emotional  Stress Factors  Patient Stress Factors Family relationships

## 2012-03-28 NOTE — Interval H&P Note (Signed)
History and Physical Interval Note:  03/28/2012 3:56 PM  [name]  has presented today for cardiac cath with the diagnosis of chest pain, NSTEMI.  The various methods of treatment have been discussed with the patient and family. After consideration of risks, benefits and other options for treatment, the patient has consented to  Procedure(s) (LRB) with comments: LEFT HEART CATHETERIZATION WITH CORONARY ANGIOGRAM (N/A) as a surgical intervention .  The patient's history has been reviewed, patient examined, no change in status, stable for surgery.  I have reviewed the patient's chart and labs.  Questions were answered to the patient's satisfaction.     Miller Edgington

## 2012-03-29 ENCOUNTER — Encounter (HOSPITAL_COMMUNITY): Payer: Self-pay | Admitting: Physician Assistant

## 2012-03-29 DIAGNOSIS — I059 Rheumatic mitral valve disease, unspecified: Secondary | ICD-10-CM

## 2012-03-29 DIAGNOSIS — I214 Non-ST elevation (NSTEMI) myocardial infarction: Secondary | ICD-10-CM

## 2012-03-29 LAB — BASIC METABOLIC PANEL
CO2: 24 mEq/L (ref 19–32)
Calcium: 8.5 mg/dL (ref 8.4–10.5)
Chloride: 103 mEq/L (ref 96–112)
GFR calc Af Amer: 48 mL/min — ABNORMAL LOW (ref >90)
Sodium: 136 mEq/L (ref 135–145)

## 2012-03-29 LAB — GLUCOSE, CAPILLARY
Glucose-Capillary: 140 mg/dL — ABNORMAL HIGH (ref 70–99)
Glucose-Capillary: 185 mg/dL — ABNORMAL HIGH (ref 70–99)
Glucose-Capillary: 86 mg/dL (ref 70–99)

## 2012-03-29 LAB — CBC
Hemoglobin: 10.8 g/dL — ABNORMAL LOW (ref 13.0–17.0)
RBC: 3.73 MIL/uL — ABNORMAL LOW (ref 4.22–5.81)
WBC: 8.4 10*3/uL (ref 4.0–10.5)

## 2012-03-29 MED ORDER — ASPIRIN EC 81 MG PO TBEC
81.0000 mg | DELAYED_RELEASE_TABLET | Freq: Every day | ORAL | Status: DC
  Administered 2012-03-29: 81 mg via ORAL
  Filled 2012-03-29: qty 1

## 2012-03-29 MED ORDER — PRAVASTATIN SODIUM 80 MG PO TABS: 80.0000 mg | ORAL_TABLET | Freq: Every day | ORAL | Status: DC

## 2012-03-29 MED ORDER — NITROGLYCERIN 0.4 MG SL SUBL: 0.4000 mg | SUBLINGUAL_TABLET | SUBLINGUAL | Status: DC | PRN

## 2012-03-29 MED ORDER — METOPROLOL TARTRATE 25 MG PO TABS
25.0000 mg | ORAL_TABLET | Freq: Two times a day (BID) | ORAL | Status: DC
  Administered 2012-03-29: 25 mg via ORAL
  Filled 2012-03-29 (×2): qty 1

## 2012-03-29 MED ORDER — CLOPIDOGREL BISULFATE 75 MG PO TABS: 75.0000 mg | ORAL_TABLET | Freq: Every day | ORAL | Status: DC

## 2012-03-29 MED ORDER — OMEGA-3-ACID ETHYL ESTERS 1 G PO CAPS
1.0000 g | ORAL_CAPSULE | Freq: Every day | ORAL | Status: DC
  Administered 2012-03-29: 1 g via ORAL
  Filled 2012-03-29: qty 1

## 2012-03-29 MED ORDER — ATORVASTATIN CALCIUM 80 MG PO TABS: 80.0000 mg | ORAL_TABLET | Freq: Every day | ORAL | Status: DC

## 2012-03-29 MED ORDER — OMEGA-3 FATTY ACIDS 1000 MG PO CAPS: 1.0000 g | ORAL_CAPSULE | Freq: Every day | ORAL | Status: DC

## 2012-03-29 MED ORDER — GLIPIZIDE 5 MG PO TABS
5.0000 mg | ORAL_TABLET | Freq: Every day | ORAL | Status: DC
  Administered 2012-03-29: 5 mg via ORAL
  Filled 2012-03-29 (×2): qty 1

## 2012-03-29 MED ORDER — ATORVASTATIN CALCIUM 80 MG PO TABS
80.0000 mg | ORAL_TABLET | Freq: Every day | ORAL | Status: DC
  Administered 2012-03-29: 80 mg via ORAL
  Filled 2012-03-29: qty 1

## 2012-03-29 MED ORDER — METOPROLOL TARTRATE 100 MG PO TABS: 100.0000 mg | ORAL_TABLET | Freq: Two times a day (BID) | ORAL | Status: DC

## 2012-03-29 MED ORDER — AMLODIPINE BESYLATE 5 MG PO TABS
5.0000 mg | ORAL_TABLET | Freq: Every day | ORAL | Status: DC
  Administered 2012-03-29: 5 mg via ORAL
  Filled 2012-03-29: qty 1

## 2012-03-29 MED ORDER — METOPROLOL TARTRATE 25 MG PO TABS: 50.0000 mg | ORAL_TABLET | Freq: Two times a day (BID) | ORAL | Status: DC

## 2012-03-29 NOTE — Care Management Note (Addendum)
    Page 1 of 1   03/30/2012     2:53:55 PM   CARE MANAGEMENT NOTE 03/30/2012  Patient:  [name], [name]   Account Number:  [id number]  Date Initiated:  03/29/2012  Documentation initiated by:  GRAVES-BIGELOW,BRENDA  Subjective/Objective Assessment:   Pt admitted with cp Nstemi. S/p cath.     Action/Plan:   CM will continue to monitor for disposition needs. FC notified for assistance with hospital bill. Pt will be eligible for the match program for medication assistance. Pt will have to pay 3.00 for each Rx written. 34 day Rx for medications.   Anticipated DC Date:  03/30/2012   Anticipated DC Plan:  HOME/SELF CARE  In-house referral  Financial Counselor      DC Planning Services  CM consult  MATCH Program  Medication Assistance      Choice offered to / List presented to:             Status of service:  Completed, signed off Medicare Important Message given?   (If response is "NO", the following Medicare IM given date fields will be blank) Date Medicare IM given:   Date Additional Medicare IM given:    Discharge Disposition:  HOME/SELF CARE  Per UR Regulation:  Reviewed for med. necessity/level of care/duration of stay  If discussed at Long Length of Stay Meetings, dates discussed:    Comments:  03/30/12- 1230- Donn Pierini RN, BSN [phone] Pt set up with [facility] program for medications- call placed to pt and program explained- pt has prescriptions that he was given on discharge- pt to come pick up [facility] letter at dest on [address].  For match program pain meds not included. Thanks Please call CM for assistance. [phone]

## 2012-03-29 NOTE — Progress Notes (Signed)
Patient Name: [name] Date of Encounter: 03/29/2012     Principal Problem:  *NSTEMI (non-ST elevated myocardial infarction) Active Problems:  Hyperlipidemia  Type 2 diabetes mellitus with diabetic chronic kidney disease  PAD (peripheral artery disease)  Tobacco abuse  HTN (hypertension)    SUBJECTIVE: Feels well. Ambulated with cardiac rehab without incident. No cp, SOB, lightheadedness, leg pain or palpitations.    OBJECTIVE  Filed Vitals:   03/28/12 1800 03/28/12 1830 03/28/12 2206 03/29/12 0500  BP: 175/90 169/81 146/62 171/93  Pulse: 80 86 85 77  Temp:   98.7 F (37.1 C) 98.8 F (37.1 C)  TempSrc:   Oral Oral  Resp:   20 18  Height:      Weight:      SpO2:  98% 100% 91%    Intake/Output Summary (Last 24 hours) at 03/29/12 0859 Last data filed at 03/28/12 1727  Gross per 24 hour  Intake      0 ml  Output    375 ml  Net   -375 ml   Weight change:   PHYSICAL EXAM  General: Well developed, well nourished, in no acute distress. Head: Normocephalic, atraumatic, sclera non-icteric, no xanthomas, nares are without discharge. Neck: Supple without bruits or JVD. Lungs:   Resp regular and unlabored, CTA. Heart: RRR no s3, s4, or murmurs. Abdomen:  Soft, non-tender, non-distended, BS + x 4.  Msk:  Strength and tone appears normal for age. Extremities: Groin site without bruits, bleeding or discharge. Mild tenderness to palpation. No clubbing, cyanosis or edema. DP/PT/Radials 2+ and equal bilaterally. Neuro: Alert and oriented X 3. Moves all extremities spontaneously. Psych: Normal affect.  LABS:  Recent Labs  [facility] 03/29/12 0604 03/28/12 0603   WBC 8.4 10.3   HGB 10.8* 10.3*   HCT 31.6* 30.5*   MCV 84.7 84.5   PLT 295 266   Lab 03/28/12 0603  NA 139  K 3.5  CL 106  CO2 24  BUN 21  CREATININE 1.93*  CALCIUM 8.3*  PROT --  BILITOT --  ALKPHOS --  ALT --  AST --  AMYLASE --  LIPASE --  GLUCOSE 164*   Recent Labs  Basename 03/28/12  1313   HGBA1C 7.9*   Recent Labs  Basename 03/28/12 0603 03/28/12 0002 03/27/12 1824   CKTOTAL -- -- --   CKMB -- -- --   CKMBINDEX -- -- --   TROPONINI 6.01* 5.90* 3.50*   Recent Labs  Basename 03/28/12 0603   CHOL 320*   HDL 33*   LDLCALC 224*   TRIG 314*   CHOLHDL 9.7   LDLDIRECT --   TELE: NSR, 70-90 bpm, occasional PVCs, couplets  Radiology/Studies:  No results found.  Current Medications:     . [COMPLETED] clopidogrel  600 mg Oral Once  . clopidogrel  75 mg Oral Q breakfast  . [COMPLETED] fentaNYL      . [COMPLETED] heparin      . [COMPLETED] heparin      . insulin aspart  0-9 Units Subcutaneous TID WC  . [COMPLETED] lidocaine      . [COMPLETED] midazolam      . [COMPLETED] nitroGLYCERIN      . [COMPLETED] verapamil      . [DISCONTINUED] amLODipine  5 mg Oral Daily  . [DISCONTINUED] aspirin EC  81 mg Oral Daily  . [DISCONTINUED] aspirin EC  81 mg Oral Once  . [DISCONTINUED] atorvastatin  80 mg Oral q1800  . [DISCONTINUED] glimepiride  4 mg Oral Q breakfast  . [DISCONTINUED] metoprolol tartrate  25 mg Oral BID  . [DISCONTINUED] sodium chloride  3 mL Intravenous Q12H    ASSESSMENT AND PLAN:  1. NSTEMI/CAD- patient presented to [facility] with NSTEMI after 2 weeks of accelerating angina. Transferred to [facility] for further evaluation/management. Cath yesterday revealed 20% prox and mid LAD, 100% distal LAD, 30% prox OM, 100% distal OM, 40% prox RCA, 40% mid RCA, 25% distal RCA, small PDA w/ 80% diffuse dz, PLB small w/ 50% diffuse stenoses. No focal lesions for PCI. Recommendations for continued medical management and aggressive RF reduction. Continue ASA, BB, statin. Loaded with Plavix yesterday. Will continue 75mg  daily. Echo pending.   2. PAD- patient w/ documented history of PAD and endorses convincing claudication symptoms (walks 10 feet before experiencing severe aching bilateral calf pain which relieves with rest). Will need bilateral ABIs +/-  angiography +/- intervention.   3. Type 2 DM, uncontrolled, with diabetic nephropathy- A1C 7.9% indicating poor glycemic control. Continue SSI and glimepiride. Patient has an appointment with a nephrologist next month where further work-up as to underlying CKD etiology can be determined (likely diabetes and hypertension sequela). Will need continued follow-up with PCP for ongoing management. Check BMET this AM.    4. Hyperlipidemia- LDL 224, HDL 33, TG 314, TC 320. Continue high-dose atorvastatin.   5. Hypertension, uncontrolled- unclear as to whether hypertension is contributing to CKD or vice versa. This will be further evaluated as an outpatient. Will need aggressive control. Consider up-titrating Lopressor given underlying CAD. If further BP control needed, adjusting Norvasc may be more effective.   6. Tobacco abuse- 29 pack-year history. Smoking cessation highly stressed. Would like to attempt quitting on his own.   Not clear why pre-cath meds were not resumed post-procedure. Will add back. Patient requesting discharge today to attend last DWI class. Will discuss with MD. May be able to go after echo and BMET.    Signed, R. Hurman Horn, PA-C 03/29/2012, 8:59 AM  Patient seen, examined. Available data reviewed. Agree with findings, assessment, and plan as outlined by Hurman Horn, PA-C. Pt independently interviewed and examined. He needs generic meds at discharge and I have reviewed current meds which are all appropriate. I stressed the importance of close medical follow-up with him. Considering his  Multiple comorbidities, would discharge him with a transitional care plan in the [place] office. He is chest pain free and stable for discharge this afternoon.  Tonny Bollman, M.D. 03/29/2012 4:25 PM

## 2012-03-29 NOTE — Discharge Summary (Signed)
Discharge Summary   Patient ID: [name],  MRN: [id number], DOB/AGE: Jul 28, 1970 41 y.o.  Admit date: 03/27/2012 Discharge date: 03/29/2012  Primary Physician: Provider Not In System Primary Cardiologist: Dr. Diona Browner  Discharge Diagnoses Principal Problem:  *NSTEMI (non-ST elevated myocardial infarction) Active Problems:  Hyperlipidemia  Type 2 diabetes mellitus with diabetic chronic kidney disease  PAD (peripheral artery disease)  Tobacco abuse  HTN (hypertension)  Chronic combined systolic and diastolic CHF (congestive heart failure)   Allergies Allergies  Allergen Reactions  . Vytorin (Ezetimibe-Simvastatin)     Weakness, muscle aches bad.    Diagnostic Studies/Procedures  CHEST X-RAY - 03/27/12 [facility] hospital   No acute changes.   CARDIAC CATHETERIZATION - 03/28/12  Hemodynamic Findings:  Central aortic pressure: 150/100  Left ventricular pressure: 148/17/21  Angiographic Findings:  Left main: No obstructive disease.  Left Anterior Descending Artery: Large vessel that coursed to the apex. The proximal and mid vessel had serial 20% lesions. The distal vessel had 100% occlusion just before the apex. The vessel was 1.75 mm in this location. The diagonal branch was small in caliber and patent with mild diffuse disease.  Circumflex Artery: Large caliber vessel with large first obtuse marginal branch. There is a 30% stenosis in the proximal portion of the obtuse marginal branch. The distal segment of the OM branch is 100% occluded.  Right Coronary Artery: Large, dominant vessel with 40% proximal stenosis, long tubular 40% mid stenosis, serial 25% distal stenoses. The PDA is very small caliber and has diffuse 80% stenosis. The Posterolateral branch is small in caliber and has diffuse 50% stenosis.  Left Ventricular Angiogram: Deferred.  Impression:  1. 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 No  focal lesions for PCI  2. NSTEMI secondary to above.   2D ECHOCARDIOGRAM - 03/29/12  Study Conclusions  - Left ventricle: The cavity size was normal. Wall thickness was increased in a pattern of mild LVH. Systolic function was mildly to moderately reduced. The estimated ejection fraction was in the range of 40% to 45%. There is akinesis of the posterolateral myocardium. Features are consistent with a pseudonormal left ventricular filling pattern, with concomitant abnormal relaxation and increased filling pressure (grade 2 diastolic dysfunction). - Mitral valve: Mild regurgitation.  History of Present Illness/Hospital Course   [name] is a 41yo AA male transferred from [facility] hospital to [facility] hospital on 03/27/12 for NSTEMI. He has several underlying cardiac risk factors including uncontrolled HTN, HL, uncontrolled type 2 DM (w/ sequelae) and tobacco abuse. He has a noted history of PAD and endorses claudication symptoms. He was seen in consultation on 03/27/12 at [facility] for NSTEMI. He had presented to the ED there earlier c/o worsening SSCP x 2 weeks aggravated on exertion. That morning, he was mopping a bathroom and felt a sharp, severe 10/10 cp radiating to his L arm with associated nausea and diaphoresis. He presented to the ED. EKG revealed no ischemic changes. Initial trop-I returned elevated at 3.9. Heparin was started on heparin. He was given full-dose ASA and NTG tablet. The plan was made to transfer to [facility] for cardiac catheterization. This was discussed with the patient who agreed.   He remained stable en route. Serial troponins revealed a steady up-trend. He remained heparinized, and chest pain was well-controlled (1/10). Cr was found to be mildly elevated. He was hydrated throughout the morning, and underwent cardiac catheterization as above. This revealed triple  vessel CAD with apical LAD and distal OM1 occlusions. There were no lesions amenable to PCI. LV-gram  deferred. Medical management with risk factor reduction was recommended. He was loaded with Plavix. He remained stable post-cath. Renal function continued to improve. He underwent 2D echo revealing LVEF 40-45%, grade 2 dd, PL AK and mild MR. He was found to be stable for discharge today. He will continue the medications below. He will follow-up in the [place] office </= 7 days given NSTEMI status. He will be called with the appointment date and time. Tobacco cessation counseling, and risk factor modification has been stressed. He declined tobacco cessation assistance. CM consult has deemed him appropriate for match program. Arrangements will be made so that he may afford these medications. Of note, he did endorse claudication symptoms, and will likely need ABIs as an outpatient.   Discharge Vitals:  Blood pressure 172/83, pulse 80, temperature 97.5 F (36.4 C), temperature source Oral, resp. rate 18, height 5' 9.5" (1.765 m), weight 100.2 kg (220 lb 14.4 oz), SpO2 97.00%.   Labs: Recent Labs  [facility] 03/29/12 0604 03/28/12 0603   WBC 8.4 10.3   HGB 10.8* 10.3*   HCT 31.6* 30.5*   MCV 84.7 84.5   PLT 295 266    Lab 03/29/12 1013 03/28/12 0603  NA 136 139  K 3.4* 3.5  CL 103 106  CO2 24 24  BUN 17 21  CREATININE 1.91* 1.93*  CALCIUM 8.5 8.3*  PROT -- --  BILITOT -- --  ALKPHOS -- --  ALT -- --  AST -- --  AMYLASE -- --  LIPASE -- --  GLUCOSE 251* 164*   Recent Labs  Basename 03/28/12 1313   HGBA1C 7.9*   Recent Labs  Basename 03/28/12 0603 03/28/12 0002 03/27/12 1824   CKTOTAL -- -- --   CKMB -- -- --   CKMBINDEX -- -- --   TROPONINI 6.01* 5.90* 3.50*   Recent Labs  Basename 03/28/12 0603   CHOL 320*   HDL 33*   LDLCALC 224*   TRIG 314*   CHOLHDL 9.7   LDLDIRECT --   Disposition:  Discharge Orders    Future Orders Please Complete By Expires   Amb Referral to Cardiac Rehabilitation      Comments:   Referring to [facility] phase 2     Follow-up Information    Follow  up with [facility]. (Office will call you with an appointment date and time. )    Contact information:   [address] [place] [address]       Schedule an appointment as soon as possible for a visit with Provider Not In System. (In 1-2 weeks for management of diabetes and post-hospital follow-up. )         Discharge Medications:    Medication List     As of 03/29/2012  5:57 PM    START taking these medications         atorvastatin 80 MG tablet   Commonly known as: LIPITOR   Take 1 tablet (80 mg total) by mouth daily at 6 PM.      clopidogrel 75 MG tablet   Commonly known as: PLAVIX   Take 1 tablet (75 mg total) by mouth daily with breakfast.      nitroGLYCERIN 0.4 MG SL tablet   Commonly known as: NITROSTAT   Place 1 tablet (0.4 mg total) under the tongue every 5 (five) minutes as needed for  chest pain.      CHANGE how you take these medications         metoprolol tartrate 25 MG tablet   Commonly known as: LOPRESSOR   Take 2 tablets (50 mg total) by mouth 2 (two) times daily.   What changed: - medication strength - dose      CONTINUE taking these medications         acetaminophen 500 MG tablet   Commonly known as: TYLENOL      amLODipine 5 MG tablet   Commonly known as: NORVASC      aspirin EC 81 MG tablet      fish oil-omega-3 fatty acids 1000 MG capsule      glipiZIDE 5 MG tablet   Commonly known as: GLUCOTROL      STOP taking these medications         lisinopril-hydrochlorothiazide 20-12.5 MG per tablet   Commonly known as: PRINZIDE,ZESTORETIC          Where to get your medications    These are the prescriptions that you need to pick up. We sent them to a specific pharmacy, so you will need to go there to get them.   [facility] - [place], [place] - [address]    [address] [place] [address]    Phone: [phone]        atorvastatin 80 MG tablet   clopidogrel 75 MG tablet   metoprolol tartrate  25 MG tablet   nitroGLYCERIN 0.4 MG SL tablet           Outstanding Labs/Studies: None  Duration of Discharge Encounter: Greater than 30 minutes including physician time.  Signed, R. Hurman Horn, PA-C 03/29/2012, 5:57 PM

## 2012-03-29 NOTE — Progress Notes (Signed)
  Echocardiogram 2D Echocardiogram has been performed.  Cathie Beams 03/29/2012, 10:55 AM

## 2012-03-29 NOTE — Progress Notes (Signed)
CARDIAC REHAB PHASE I   PRE:  Rate/Rhythm: 88 SR    BP: sitting 150/76    SaO2:   MODE:  Ambulation: 1000 ft   POST:  Rate/Rhythm: 110 ST    BP: sitting 220/100     SaO2:   Pt with leg pain almost entire walk. Pushed through, had x2 rests prompted by me. This is his norm. Denied CP, sts he feels great besides his legs. BP after walk 220/100. Long discussion with pt re: lifestyle change. Pt has been trying to make changes but slips up from time to time. Encouraged more moderate lifestyle so that he doesn't get overwhelmed. Highly encouraged smoking cessation, which pt wants to do and has tried multiple times. 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 Encouraged nicotene patches and changing environmental habits. Pt very receptive to all ed and interested in [facility] in [place]. Will send referral.  [id number]  Harriet Masson CES, ACSM

## 2012-03-29 NOTE — Progress Notes (Signed)
Pt qualified for the Match program to help acquire medications, Dr. Excell Seltzer switched Lipitor to Pravacol due financial concerns.  Pt ready for d/c. Instructed pt to come back tomorrow to pick up paperwork and card for Match Program. Instructed to start taking Prevastatin, amlodipine, and nitro. Instructed to take nitro for chest pain and if pain not relieved to take another in five minutes, then call 911. Gave handout on post catheterization care. D/c'd telemetry, pt in SR. Removed PIV, WNL. Pt d/c'd to home with mother and grandmother.

## 2012-03-30 ENCOUNTER — Telehealth: Payer: Self-pay | Admitting: Cardiology

## 2012-03-30 NOTE — Telephone Encounter (Signed)
Per Delfina Redwood. Requesting management of medication refills/patient assistance upon return to clinic.

## 2012-03-30 NOTE — Telephone Encounter (Signed)
Patient is trans care so he will need to be called with 48 hours of his dsch from [facility] which was 11/20.   Also a message was forwarded to you mailbox about other things [name] needs done  Patient belongs to [facility] ( he did consult) but schedued with Gene since he has to be seen within 7 days

## 2012-03-31 NOTE — Telephone Encounter (Signed)
Left message with step daughter to have patient call office.

## 2012-03-31 NOTE — Telephone Encounter (Signed)
Spoke with patient and he denied having any chest pain,dizziness or shortness of breath or edema. Patient has taken all medications without missing any doses. Patient hasn't experienced any side effects from medications. Patient states he is doing good except having a little soreness. Nurse advised patient that he should bring all medications bottles to office visit. Patient verbalized understanding of plan.

## 2012-04-05 ENCOUNTER — Encounter: Payer: Self-pay | Admitting: Physician Assistant

## 2012-04-05 ENCOUNTER — Encounter: Payer: Self-pay | Admitting: *Deleted

## 2012-04-05 ENCOUNTER — Ambulatory Visit (INDEPENDENT_AMBULATORY_CARE_PROVIDER_SITE_OTHER): Payer: PRIVATE HEALTH INSURANCE | Admitting: Physician Assistant

## 2012-04-05 VITALS — BP 164/90 | HR 76 | Ht 69.0 in | Wt 225.8 lb

## 2012-04-05 DIAGNOSIS — Z79899 Other long term (current) drug therapy: Secondary | ICD-10-CM

## 2012-04-05 DIAGNOSIS — I2510 Atherosclerotic heart disease of native coronary artery without angina pectoris: Secondary | ICD-10-CM

## 2012-04-05 DIAGNOSIS — I10 Essential (primary) hypertension: Secondary | ICD-10-CM

## 2012-04-05 DIAGNOSIS — E782 Mixed hyperlipidemia: Secondary | ICD-10-CM

## 2012-04-05 MED ORDER — AMLODIPINE BESYLATE 10 MG PO TABS: 10.0000 mg | ORAL_TABLET | Freq: Every day | ORAL | Status: DC

## 2012-04-05 MED ORDER — LISINOPRIL 5 MG PO TABS: 5.0000 mg | ORAL_TABLET | Freq: Every day | ORAL | Status: DC

## 2012-04-05 NOTE — Progress Notes (Addendum)
Primary Cardiologist: Simona Huh, MD   HPI: Post hospital followup from [facility], following transfer from [facility] with NST EMI. Patient presented with no known history of CAD, and had a negative dobutamine stress echocardiogram in 2009.   - 3v CAD with 100% occlusion of apical LAD and distal OM 1, not amenable to PCI; LVG deferred secondary to CKD.   - 2-D echo: EF 40-45%, posterolateral AK; grade 2 diastolic dysfunction; mild MR   Clinically, he has not had any recurrent CP. He is gradually increasing exercise tolerance. Would like to return to work next week.  Of note, he is assiduously trying to stop smoking altogether, currently at 2 cigarettes a day, compared to previous 1 PPD.  He denies any complications of right groin incision site.  Allergies  Allergen Reactions  . Vytorin (Ezetimibe-Simvastatin)     Weakness, muscle aches bad.    Current Outpatient Prescriptions  Medication Sig Dispense Refill  . acetaminophen (TYLENOL) 500 MG tablet Take 500 mg by mouth every 6 (six) hours as needed. For pain      . aspirin EC 81 MG tablet Take 81 mg by mouth daily.        Marland Kitchen atorvastatin (LIPITOR) 80 MG tablet Take 80 mg by mouth daily.      . clopidogrel (PLAVIX) 75 MG tablet Take 1 tablet (75 mg total) by mouth daily with breakfast.  34 tablet  0  . fish oil-omega-3 fatty acids 1000 MG capsule Take 1 g by mouth daily.        Marland Kitchen glipiZIDE (GLUCOTROL) 5 MG tablet Take 5 mg by mouth daily.      . metoprolol succinate (TOPROL-XL) 100 MG 24 hr tablet Take 100 mg by mouth 2 (two) times daily. Take with or immediately following a meal.      . nitroGLYCERIN (NITROSTAT) 0.4 MG SL tablet Place 1 tablet (0.4 mg total) under the tongue every 5 (five) minutes as needed for chest pain.  34 tablet  0  . amLODipine (NORVASC) 10 MG tablet Take 1 tablet (10 mg total) by mouth daily.  30 tablet  6    Past Medical History  Diagnosis Date  . Hypertension   . Diabetes mellitus   . Hypercholesteremia   . CAD  (coronary artery disease)     NST EMI, 03/2012, 2/2 100% apical LAD and distal OM1, Rx medically  . Cardiomyopathy     03/29/12 echo: LVEF 40-45%, grade 2 dd, posterolateral AK, mild MR  . CKD (chronic kidney disease)   . RBBB   . PAD (peripheral artery disease)     Normal ABIs, 2011; focal left EIA stenosis; mild bilateral distal SFA disease, 2008  . Tobacco abuse     Past Surgical History  Procedure Date  . Eye surgery   . Arm surgery   . Cardiac catheterization 03/28/12    20% prox and mid LAD, 100% distal LAD, 30% prox OM, 100% distal OM, 40% prox RCA, 40% mid RCA, 25% distal RCA, small PDA w/ 80% diffuse dz, PLB small w/ 50% diffuse stenoses. No focal lesions for PCI. Recommendations for continued medical management and aggressive RF reduction    History   Social History  . Marital Status: Married    Spouse Name: N/A    Number of Children: N/A  . Years of Education: N/A   Occupational History  . Not on file.   Social History Main Topics  . Smoking status: Current Every Day Smoker  . Smokeless tobacco:  Not on file  . Alcohol Use: No  . Drug Use: No  . Sexually Active:    Other Topics Concern  . Not on file   Social History Narrative  . No narrative on file   Social History Narrative  . No narrative on file    Problem Relation Age of Onset  . Heart attack Father 50    ROS: no nausea, vomiting; no fever, chills; no melena, hematochezia; no claudication  PHYSICAL EXAM: BP 164/90  Pulse 76  Ht 5\' 9"  (1.753 m)  Wt 225 lb 12.8 oz (102.422 kg)  BMI 33.34 kg/m2 GENERAL: 41 year old, obese; NAD HEENT: NCAT, PERRLA, EOMI; sclera clear; no xanthelasma NECK: palpable bilateral carotid pulses, no bruits; no JVD; no TM LUNGS: CTA bilaterally CARDIAC: RRR (S1, S2); no significant murmurs; no rubs or gallops ABDOMEN: Protuberant EXTREMETIES: no significant peripheral edema SKIN: warm/dry; no obvious rash/lesions MUSCULOSKELETAL: no joint deformity NEURO: no  focal deficit; NL affect   EKG:    ASSESSMENT & PLAN:  NSTEMI (non-ST elevated myocardial infarction) Patient advised to gradually increase his exercise tolerance. He is cleared to return to work next week, and per his request. We will reassess his clinical status in 3 months, with Dr. Diona Browner.  Hyperlipidemia Will reassess lipid status in 12 weeks, following addition of high dose Lipitor. Aggressive management recommended with target LDL 70 or less, if feasible.  HTN (hypertension) Will increase Norvasc to 10 mg daily for more aggressive management of HTN. I considered adding an ACE inhibitor, particularly in light of his cardiomyopathy. However he has CKD, and would need very close monitoring of his renal function. And he is also already on high-dose beta blocker.  CKD (chronic kidney disease) Will check followup labs for close monitoring of renal function, status post recent catheterization. Patient is otherwise scheduled to follow with Dr. Kristian Covey in the next few weeks.  Tobacco abuse Patient appears motivated to stop smoking altogether.    Gene Tyse Auriemma, PAC

## 2012-04-05 NOTE — Assessment & Plan Note (Signed)
Patient advised to gradually increase his exercise tolerance. He is cleared to return to work next week, and per his request. We will reassess his clinical status in 3 months, with Dr. Diona Browner.

## 2012-04-05 NOTE — Assessment & Plan Note (Signed)
Will reassess lipid status in 12 weeks, following addition of high dose Lipitor. Aggressive management recommended with target LDL 70 or less, if feasible.

## 2012-04-05 NOTE — Assessment & Plan Note (Addendum)
Will increase Norvasc to 10 mg daily for more aggressive management of HTN. I considered adding an ACE inhibitor, particularly in light of his cardiomyopathy. However he has CKD, and would need very close monitoring of his renal function. And he is also already on high-dose beta blocker.

## 2012-04-05 NOTE — Assessment & Plan Note (Signed)
Will check followup labs for close monitoring of renal function, status post recent catheterization. Patient is otherwise scheduled to follow with Dr. Kristian Covey in the next few weeks.

## 2012-04-05 NOTE — Patient Instructions (Addendum)
   Labs for fasting lipid and liver panel - due in 3 months (around July 06, 2012)  Lab - today for BMET, BNP   Increase Norvasc to 10mg  daily Continue all other current medications.  Can return to work first week of December  Follow up in  3 months

## 2012-04-05 NOTE — Assessment & Plan Note (Signed)
Patient appears motivated to stop smoking altogether.

## 2012-04-10 ENCOUNTER — Encounter: Payer: Self-pay | Admitting: *Deleted

## 2012-04-12 ENCOUNTER — Telehealth: Payer: Self-pay | Admitting: *Deleted

## 2012-04-12 NOTE — Telephone Encounter (Signed)
Message copied by Lesle Chris on Wed Apr 12, 2012 12:13 PM ------      Message from: Rande Brunt      Created: Tue Apr 11, 2012 11:14 AM       Pt not on a diuretic, and has CKD. Please send copy to Dr Rosealee Albee, with whom he follows.

## 2012-04-12 NOTE — Telephone Encounter (Signed)
Notes Recorded by Lesle Chris, LPN on 96/0/4540 at 12:13 PM Patient notified. Will forward copy to Dr. Kristian Covey. Patient states he has OV there on Monday, 12/9. Follow up with [place] scheduled for July 14, 2012 with Dr. Diona Browner.

## 2012-05-01 ENCOUNTER — Telehealth: Payer: Self-pay | Admitting: *Deleted

## 2012-05-01 MED ORDER — ATORVASTATIN CALCIUM 80 MG PO TABS: 80.0000 mg | ORAL_TABLET | Freq: Every day | ORAL | Status: DC

## 2012-05-01 MED ORDER — CLOPIDOGREL BISULFATE 75 MG PO TABS: 75.0000 mg | ORAL_TABLET | Freq: Every day | ORAL | Status: DC

## 2012-05-01 MED ORDER — METOPROLOL SUCCINATE ER 100 MG PO TB24: 100.0000 mg | ORAL_TABLET | Freq: Two times a day (BID) | ORAL | Status: DC

## 2012-05-01 MED ORDER — AMLODIPINE BESYLATE 10 MG PO TABS: 10.0000 mg | ORAL_TABLET | Freq: Every day | ORAL | Status: DC

## 2012-05-01 NOTE — Telephone Encounter (Signed)
Patient c/o having increased swelling of feet and legs, hands, and sob and dizziness for a week that has gotten worse today. Patient c/o of more sob when moving around or bending over. Patient say's his legs are 2-3 times larger than usual. Dizziness has gone away. Patient states his weight is 240 lbs and his regular weight 225 lbs. Patient denies having chest pain. Patient hasn't taken norvasc due to not being able to afford it. Patient is reluctant to going to ED. Nurse informed patient that he would receive a return call on tomorrow morning.

## 2012-05-02 NOTE — Telephone Encounter (Signed)
Called and informed wife and she verbalized understanding of plan.

## 2012-05-02 NOTE — Telephone Encounter (Signed)
Records and f/u labs reviewed. Pt may have worsening renal insufficiency, based on most recent labs of 04/07/12, as well as CHF. Recommend close monitoring and possible aggressive diuretic Rx in the hospital.

## 2012-05-08 ENCOUNTER — Telehealth: Payer: Self-pay | Admitting: *Deleted

## 2012-05-08 NOTE — Telephone Encounter (Signed)
Patient still c/o swelling in legs.  States is now worse than it was on 12/23.  C/o feeling bloated all the time.  Weight on Saturday was 240.  Still has not began the Norvasc.  States breathing is worse at night.

## 2012-05-08 NOTE — Telephone Encounter (Signed)
Patient notified

## 2012-05-08 NOTE — Telephone Encounter (Signed)
Recommend pt go to ER for aggressive Rx

## 2012-05-09 ENCOUNTER — Telehealth: Payer: Self-pay | Admitting: Cardiology

## 2012-05-09 NOTE — Telephone Encounter (Signed)
Spoke with Gene about patients request and he advised patient to speak with family doctor about disability. Nurse called and spoke with patient and informed patient that the disability process would need to be initiated by his family doctor. Patient states that when he is working, he has increased swelling in legs and feet and has to stay out for days before it improves. Patient also told nurse that he hasn't started the amlodipine, and will see his nephrologist on the 7 th of January. Nurse also informed patient that for his symptoms, he is still advised to be evaluated by ED. Patient verbalized understanding of plan.

## 2012-05-09 NOTE — Telephone Encounter (Signed)
Patient wants to talk with Doctor or PA about getting Disability. Will he need appointment or can this be done without one since he was just seen Nov 27th.

## 2012-05-22 ENCOUNTER — Ambulatory Visit: Payer: PRIVATE HEALTH INSURANCE | Admitting: Physician Assistant

## 2012-06-28 ENCOUNTER — Telehealth: Payer: Self-pay | Admitting: Physician Assistant

## 2012-06-28 NOTE — Telephone Encounter (Signed)
[name] called inquiring on where to send disability papers on patient and $25.00 fee, they will pay.  Checked chart and advised patient was referred to his primary care physician for this.    She states she spoke to someone, but not sure who.

## 2012-07-11 ENCOUNTER — Other Ambulatory Visit: Payer: Self-pay | Admitting: Physician Assistant

## 2012-07-12 ENCOUNTER — Encounter: Payer: Self-pay | Admitting: Cardiology

## 2012-07-12 ENCOUNTER — Other Ambulatory Visit: Payer: Self-pay | Admitting: *Deleted

## 2012-07-12 DIAGNOSIS — I2510 Atherosclerotic heart disease of native coronary artery without angina pectoris: Secondary | ICD-10-CM | POA: Insufficient documentation

## 2012-07-12 MED ORDER — AMLODIPINE BESYLATE 10 MG PO TABS: ORAL_TABLET | ORAL | Status: DC

## 2012-07-14 ENCOUNTER — Ambulatory Visit: Payer: PRIVATE HEALTH INSURANCE | Admitting: Cardiology

## 2012-10-12 ENCOUNTER — Encounter (HOSPITAL_COMMUNITY): Payer: Self-pay | Admitting: General Practice

## 2012-10-12 ENCOUNTER — Observation Stay (HOSPITAL_COMMUNITY)
Admission: AD | Admit: 2012-10-12 | Discharge: 2012-10-14 | Disposition: A | Discharge status: Home / Self Care | Payer: PRIVATE HEALTH INSURANCE | Source: Other Acute Inpatient Hospital | Attending: Cardiology | Admitting: Cardiology

## 2012-10-12 DIAGNOSIS — Z598 Other problems related to housing and economic circumstances: Secondary | ICD-10-CM | POA: Insufficient documentation

## 2012-10-12 DIAGNOSIS — I2589 Other forms of chronic ischemic heart disease: Secondary | ICD-10-CM | POA: Insufficient documentation

## 2012-10-12 DIAGNOSIS — I252 Old myocardial infarction: Secondary | ICD-10-CM | POA: Insufficient documentation

## 2012-10-12 DIAGNOSIS — N183 Chronic kidney disease, stage 3 unspecified: Secondary | ICD-10-CM | POA: Insufficient documentation

## 2012-10-12 DIAGNOSIS — F17200 Nicotine dependence, unspecified, uncomplicated: Secondary | ICD-10-CM | POA: Insufficient documentation

## 2012-10-12 DIAGNOSIS — I509 Heart failure, unspecified: Secondary | ICD-10-CM | POA: Insufficient documentation

## 2012-10-12 DIAGNOSIS — E782 Mixed hyperlipidemia: Secondary | ICD-10-CM | POA: Insufficient documentation

## 2012-10-12 DIAGNOSIS — Z9119 Patient's noncompliance with other medical treatment and regimen: Secondary | ICD-10-CM | POA: Insufficient documentation

## 2012-10-12 DIAGNOSIS — Z79899 Other long term (current) drug therapy: Secondary | ICD-10-CM | POA: Insufficient documentation

## 2012-10-12 DIAGNOSIS — N179 Acute kidney failure, unspecified: Secondary | ICD-10-CM | POA: Insufficient documentation

## 2012-10-12 DIAGNOSIS — Z5987 Material hardship due to limited financial resources, not elsewhere classified: Secondary | ICD-10-CM | POA: Insufficient documentation

## 2012-10-12 DIAGNOSIS — E119 Type 2 diabetes mellitus without complications: Secondary | ICD-10-CM | POA: Insufficient documentation

## 2012-10-12 DIAGNOSIS — I2510 Atherosclerotic heart disease of native coronary artery without angina pectoris: Principal | ICD-10-CM | POA: Insufficient documentation

## 2012-10-12 DIAGNOSIS — R079 Chest pain, unspecified: Secondary | ICD-10-CM

## 2012-10-12 DIAGNOSIS — I130 Hypertensive heart and chronic kidney disease with heart failure and stage 1 through stage 4 chronic kidney disease, or unspecified chronic kidney disease: Secondary | ICD-10-CM | POA: Insufficient documentation

## 2012-10-12 DIAGNOSIS — I200 Unstable angina: Secondary | ICD-10-CM | POA: Insufficient documentation

## 2012-10-12 DIAGNOSIS — Z91199 Patient's noncompliance with other medical treatment and regimen due to unspecified reason: Secondary | ICD-10-CM | POA: Insufficient documentation

## 2012-10-12 HISTORY — DX: Gout, unspecified: M10.9

## 2012-10-12 HISTORY — DX: Major depressive disorder, single episode, unspecified: F32.9

## 2012-10-12 HISTORY — DX: Personal history of other medical treatment: Z92.89

## 2012-10-12 HISTORY — DX: Angina pectoris, unspecified: I20.9

## 2012-10-12 HISTORY — DX: Depression, unspecified: F32.A

## 2012-10-12 HISTORY — DX: Heart failure, unspecified: I50.9

## 2012-10-12 MED ORDER — NITROGLYCERIN IN D5W 200-5 MCG/ML-% IV SOLN
2.0000 ug/min | INTRAVENOUS | Status: DC
  Administered 2012-10-13: 20 ug/min via INTRAVENOUS
  Filled 2012-10-12: qty 250

## 2012-10-13 DIAGNOSIS — I214 Non-ST elevation (NSTEMI) myocardial infarction: Secondary | ICD-10-CM

## 2012-10-13 DIAGNOSIS — N179 Acute kidney failure, unspecified: Secondary | ICD-10-CM

## 2012-10-13 LAB — BASIC METABOLIC PANEL
BUN: 30 mg/dL — ABNORMAL HIGH (ref 6–23)
CO2: 26 mEq/L (ref 19–32)
Calcium: 8.7 mg/dL (ref 8.4–10.5)
Chloride: 101 mEq/L (ref 96–112)
Creatinine, Ser: 3.04 mg/dL — ABNORMAL HIGH (ref 0.50–1.35)
Glucose, Bld: 197 mg/dL — ABNORMAL HIGH (ref 70–99)

## 2012-10-13 LAB — LIPID PANEL
LDL Cholesterol: UNDETERMINED mg/dL (ref 0–99)
Total CHOL/HDL Ratio: 7.9 RATIO
VLDL: UNDETERMINED mg/dL (ref 0–40)

## 2012-10-13 LAB — RAPID URINE DRUG SCREEN, HOSP PERFORMED
Amphetamines: NOT DETECTED
Benzodiazepines: NOT DETECTED
Opiates: POSITIVE — AB
Tetrahydrocannabinol: NOT DETECTED

## 2012-10-13 LAB — MRSA PCR SCREENING: MRSA by PCR: POSITIVE — AB

## 2012-10-13 LAB — CBC
HCT: 34.1 % — ABNORMAL LOW (ref 39.0–52.0)
Hemoglobin: 11.8 g/dL — ABNORMAL LOW (ref 13.0–17.0)
MCH: 29.5 pg (ref 26.0–34.0)
MCV: 85.3 fL (ref 78.0–100.0)
RBC: 4 MIL/uL — ABNORMAL LOW (ref 4.22–5.81)
WBC: 8 10*3/uL (ref 4.0–10.5)

## 2012-10-13 MED ORDER — NITROGLYCERIN 0.4 MG SL SUBL: 0.4000 mg | SUBLINGUAL_TABLET | SUBLINGUAL | Status: DC | PRN

## 2012-10-13 MED ORDER — ALUM & MAG HYDROXIDE-SIMETH 200-200-20 MG/5ML PO SUSP
30.0000 mL | ORAL | Status: DC | PRN
  Filled 2012-10-13: qty 30

## 2012-10-13 MED ORDER — HEPARIN BOLUS VIA INFUSION
3000.0000 [IU] | Freq: Once | INTRAVENOUS | Status: AC
  Administered 2012-10-13: 3000 [IU] via INTRAVENOUS
  Filled 2012-10-13: qty 3000

## 2012-10-13 MED ORDER — MORPHINE SULFATE 15 MG PO TABS
15.0000 mg | ORAL_TABLET | ORAL | Status: DC | PRN
  Administered 2012-10-13 (×3): 15 mg via ORAL
  Filled 2012-10-13 (×3): qty 1

## 2012-10-13 MED ORDER — HEPARIN SODIUM (PORCINE) 5000 UNIT/ML IJ SOLN
5000.0000 [IU] | Freq: Three times a day (TID) | INTRAMUSCULAR | Status: DC
  Administered 2012-10-13 – 2012-10-14 (×3): 5000 [IU] via SUBCUTANEOUS
  Filled 2012-10-13 (×7): qty 1

## 2012-10-13 MED ORDER — CLOPIDOGREL BISULFATE 75 MG PO TABS
75.0000 mg | ORAL_TABLET | Freq: Every day | ORAL | Status: DC
  Administered 2012-10-13 – 2012-10-14 (×2): 75 mg via ORAL
  Filled 2012-10-13 (×2): qty 1

## 2012-10-13 MED ORDER — AMLODIPINE BESYLATE 10 MG PO TABS
10.0000 mg | ORAL_TABLET | Freq: Every day | ORAL | Status: DC
  Administered 2012-10-13 – 2012-10-14 (×2): 10 mg via ORAL
  Filled 2012-10-13 (×2): qty 1

## 2012-10-13 MED ORDER — ASPIRIN EC 81 MG PO TBEC
81.0000 mg | DELAYED_RELEASE_TABLET | Freq: Every day | ORAL | Status: DC
  Administered 2012-10-13 – 2012-10-14 (×2): 81 mg via ORAL
  Filled 2012-10-13 (×2): qty 1

## 2012-10-13 MED ORDER — ATORVASTATIN CALCIUM 80 MG PO TABS
80.0000 mg | ORAL_TABLET | Freq: Every day | ORAL | Status: DC
  Administered 2012-10-13 – 2012-10-14 (×2): 80 mg via ORAL
  Filled 2012-10-13 (×2): qty 1

## 2012-10-13 MED ORDER — CHLORHEXIDINE GLUCONATE CLOTH 2 % EX PADS
6.0000 | MEDICATED_PAD | Freq: Every day | CUTANEOUS | Status: DC
  Administered 2012-10-13 – 2012-10-14 (×2): 6 via TOPICAL

## 2012-10-13 MED ORDER — MUPIROCIN 2 % EX OINT
1.0000 "application " | TOPICAL_OINTMENT | Freq: Two times a day (BID) | CUTANEOUS | Status: DC
  Administered 2012-10-13 – 2012-10-14 (×4): 1 via NASAL
  Filled 2012-10-13: qty 22

## 2012-10-13 MED ORDER — METOPROLOL SUCCINATE ER 100 MG PO TB24
100.0000 mg | ORAL_TABLET | Freq: Two times a day (BID) | ORAL | Status: DC
  Administered 2012-10-13: 100 mg via ORAL
  Filled 2012-10-13 (×3): qty 1

## 2012-10-13 MED ORDER — DEXTROSE 5 % IV SOLN
100.0000 mg | Freq: Once | INTRAVENOUS | Status: AC
  Administered 2012-10-13: 100 mg via INTRAVENOUS
  Filled 2012-10-13: qty 10

## 2012-10-13 MED ORDER — LIVING WELL WITH DIABETES BOOK
Freq: Once | Status: AC
  Administered 2012-10-13: 01:00:00
  Filled 2012-10-13: qty 1

## 2012-10-13 MED ORDER — GLIPIZIDE 5 MG PO TABS
5.0000 mg | ORAL_TABLET | Freq: Every day | ORAL | Status: DC
  Administered 2012-10-13 – 2012-10-14 (×2): 5 mg via ORAL
  Filled 2012-10-13 (×3): qty 1

## 2012-10-13 MED ORDER — HEPARIN (PORCINE) IN NACL 100-0.45 UNIT/ML-% IJ SOLN
1600.0000 [IU]/h | INTRAMUSCULAR | Status: DC
  Administered 2012-10-13: 02:00:00 1000 [IU]/h via INTRAVENOUS
  Filled 2012-10-13 (×2): qty 250

## 2012-10-13 MED ORDER — ONDANSETRON HCL 4 MG/2ML IJ SOLN
4.0000 mg | Freq: Four times a day (QID) | INTRAMUSCULAR | Status: DC | PRN
  Administered 2012-10-13: 4 mg via INTRAVENOUS
  Filled 2012-10-13: qty 2

## 2012-10-13 MED ORDER — ISOSORBIDE MONONITRATE ER 30 MG PO TB24
30.0000 mg | ORAL_TABLET | Freq: Every day | ORAL | Status: DC
  Administered 2012-10-13 – 2012-10-14 (×2): 30 mg via ORAL
  Filled 2012-10-13 (×2): qty 1

## 2012-10-13 MED ORDER — HEART ATTACK BOUNCING BOOK
Freq: Once | Status: AC
  Administered 2012-10-13: 01:00:00
  Filled 2012-10-13: qty 1

## 2012-10-13 MED ORDER — CLOPIDOGREL BISULFATE 75 MG PO TABS
300.0000 mg | ORAL_TABLET | Freq: Once | ORAL | Status: AC
  Administered 2012-10-13: 300 mg via ORAL
  Filled 2012-10-13: qty 4

## 2012-10-13 MED ORDER — ACETAMINOPHEN 325 MG PO TABS: 650.0000 mg | ORAL_TABLET | ORAL | Status: DC | PRN

## 2012-10-13 MED ORDER — CARVEDILOL 12.5 MG PO TABS
12.5000 mg | ORAL_TABLET | Freq: Two times a day (BID) | ORAL | Status: DC
  Administered 2012-10-13 – 2012-10-14 (×3): 12.5 mg via ORAL
  Filled 2012-10-13 (×4): qty 1

## 2012-10-13 NOTE — Progress Notes (Signed)
Subjective:  Patient has had no further chest pain. Repeat troponin here is negative. Still very discouraged over being unable to afford his medication.  Objective:  Vital Signs in the last 24 hours: Temp:  [97.6 F (36.4 C)-98.4 F (36.9 C)] 98 F (36.7 C) (06/06 0740) Pulse Rate:  [70-107] 70 (06/06 0740) Resp:  [18-20] 20 (06/06 0740) BP: (126-197)/(66-109) 154/80 mmHg (06/06 0740) SpO2:  [92 %-100 %] 100 % (06/06 0740) Weight:  [230 lb 2.6 oz (104.4 kg)] 230 lb 2.6 oz (104.4 kg) (06/06 0500)  Intake/Output from previous day: 06/05 0701 - 06/06 0700 In: 20.7 [I.V.:20.7] Out: 725 [Urine:725] Intake/Output from this shift: Total I/O In: 80.9 [I.V.:80.9] Out: 600 [Urine:600]  . amLODipine  10 mg Oral Daily  . aspirin EC  81 mg Oral Daily  . atorvastatin  80 mg Oral Daily  . carvedilol  12.5 mg Oral BID WC  . Chlorhexidine Gluconate Cloth  6 each Topical Q0600  . clopidogrel  75 mg Oral Q breakfast  . isosorbide mononitrate  30 mg Oral Daily  . mupirocin ointment  1 application Nasal BID      Physical Exam: The patient appears to be in no distress. Depressed affect.  Head and neck exam reveals that the pupils are equal and reactive.  The extraocular movements are full.  There is no scleral icterus.  Mouth and pharynx are benign.  No lymphadenopathy.  No carotid bruits.  The jugular venous pressure is normal.  Thyroid is not enlarged or tender.  Chest is clear to percussion and auscultation.  No rales or rhonchi.  Expansion of the chest is symmetrical.  Heart reveals no abnormal lift or heave.  First and second heart sounds are normal.  There is no murmur gallop rub or click.  The abdomen is soft and nontender.  Bowel sounds are normoactive.  There is no hepatosplenomegaly or mass.  There are no abdominal bruits.  Extremities reveal no phlebitis or edema.  Pedal pulses are good.  There is no cyanosis or clubbing.  Neurologic exam is normal strength and no  lateralizing weakness.  No sensory deficits.  Integument reveals no rash  Lab Results:  Recent Labs  10/13/12 0350  WBC 8.0  HGB 11.8*  PLT 262    Recent Labs  10/13/12 0350  NA 136  K 3.9  CL 101  CO2 26  GLUCOSE 197*  BUN 30*  CREATININE 3.04*    Recent Labs  10/13/12 0350  TROPONINI <0.30   Hepatic Function Panel No results found for this basename: PROT, ALBUMIN, AST, ALT, ALKPHOS, BILITOT, BILIDIR, IBILI,  in the last 72 hours  Recent Labs  10/13/12 0350  CHOL 268*   No results found for this basename: PROTIME,  in the last 72 hours  Imaging: No results found.  Cardiac Studies: EKG shows old DMI, old RBBB Prior echo shows EF 40-45% Assessment/Plan:  1. Ischemic heart disease with known 3 vessel disease by cath November 2013 not amenable to PCI 2. Medication noncompliance secondary to financial issues. 3. CKD 4. DM 5. Hypertensive cardiovascular disease.  Plan: Chest xray today.      Stop IV NTG. Add imdur.      Stop IV heparin. Continue ASA, plavix.      Resume glucatrol for diabetes.      Serial enzymes.      Advance activity.      Care management for financial concerns.      Probably home in am if  stable on oral regimen.  LOS: 1 day    [name] 10/13/2012, 8:30 AM

## 2012-10-13 NOTE — Progress Notes (Signed)
CARDIAC REHAB PHASE I   PRE:  Rate/Rhythm: 72 sR    BP: sitting 124/64    SaO2: 96 RA  MODE:  Ambulation: 500 ft   POST:  Rate/Rhythm: 80 SR    BP: sitting 168/104, 4 min later 151/91     SaO2:   Pt denied CP. C/o leg pain with distance. Sts he is trying to quit smoking. Sts he needs more NTG and that it is expensive. BP very high after walk, presumably due to leg pain.  [id number]   Elissa Lovett Edison CES, ACSM 10/13/2012 10:04 AM

## 2012-10-13 NOTE — Progress Notes (Signed)
ANTICOAGULATION CONSULT NOTE - Initial Consult  Pharmacy Consult for heparin Indication: chest pain/ACS  Allergies  Allergen Reactions  . Vytorin (Ezetimibe-Simvastatin)     Weakness, muscle aches bad.    Patient Measurements: Height: 5\' 9"  (175.3 cm) Weight: 230 lb 2.6 oz (104.4 kg) IBW/kg (Calculated) : 70.7 Heparin Dosing Weight: 95kg  Vital Signs: Temp: 97.6 F (36.4 C) (06/05 2330) Temp src: Oral (06/05 2330) BP: 173/98 mmHg (06/06 0100) Pulse Rate: 107 (06/06 0100)   Estimated Creatinine Clearance: 59.4 ml/min (by C-G formula based on Cr of 1.91).   Medical History: Past Medical History  Diagnosis Date  . Essential hypertension, benign   . Mixed hyperlipidemia   . Coronary atherosclerosis of native coronary artery     100% apical LAD and distal OM1, Rx medically - 11/13  . Cardiomyopathy     LVEF 40-45%  . RBBB   . PAD (peripheral artery disease)     Normal ABIs, 2011; focal left EIA stenosis; mild bilateral distal SFA disease, 2008  . CHF (congestive heart failure)   . Anginal pain   . NSTEMI (non-ST elevated myocardial infarction) 02/2012  . Type 2 diabetes mellitus   . History of blood transfusion 1988    "arm went thru glass window" (10/12/2012)  . CKD (chronic kidney disease) stage 3, GFR 30-59 ml/min   . Gouty arthritis   . Depression     Medications:  Prescriptions prior to admission  Medication Sig Dispense Refill  . amLODipine (NORVASC) 10 MG tablet TAKE (1) TABLET BY MOUTH ONCE DAILY.  30 tablet  6  . aspirin EC 81 MG tablet Take 81 mg by mouth daily.        . clopidogrel (PLAVIX) 75 MG tablet Take 1 tablet (75 mg total) by mouth daily with breakfast.  34 tablet  6  . metoprolol succinate (TOPROL-XL) 100 MG 24 hr tablet Take 1 tablet (100 mg total) by mouth 2 (two) times daily. Take with or immediately following a meal.  60 tablet  6  . nitroGLYCERIN (NITROSTAT) 0.4 MG SL tablet Place 1 tablet (0.4 mg total) under the tongue every 5 (five)  minutes as needed for chest pain.  34 tablet  0  . acetaminophen (TYLENOL) 500 MG tablet Take 500 mg by mouth every 6 (six) hours as needed. For pain      . atorvastatin (LIPITOR) 80 MG tablet Take 1 tablet (80 mg total) by mouth daily.  30 tablet  6  . fish oil-omega-3 fatty acids 1000 MG capsule Take 1 g by mouth daily.        Marland Kitchen glipiZIDE (GLUCOTROL) 5 MG tablet Take 5 mg by mouth daily.       Scheduled:  . amLODipine  10 mg Oral Daily  . aspirin EC  81 mg Oral Daily  . atorvastatin  80 mg Oral Daily  . Chlorhexidine Gluconate Cloth  6 each Topical Q0600  . clopidogrel  300 mg Oral Once  . clopidogrel  75 mg Oral Q breakfast  . metoprolol succinate  100 mg Oral BID  . mupirocin ointment  1 application Nasal BID   Infusions:  . heparin    . nitroGLYCERIN 20 mcg/min (10/13/12 0100)    Assessment: 42yo male presented to [facility] for 10/10 chest pressure radiating to LUE associated w/ SOB, initial troponin mildly elevated, SCr up from to 3.3 from baseline ~2, to continue heparin.  Goal of Therapy:  Heparin level 0.3-0.7 units/ml Monitor platelets by anticoagulation protocol:  Yes   Plan:  Rec'd heparin bolus of 1000 units followed by gtt at 1000 units/hr at OSH; on prior admission ~49mo ago pt required much higher rate but will continue for now as due for level and adjust accordingly; monitor CBC.  Vernard Gambles, PharmD, BCPS  10/13/2012,1:52 AM

## 2012-10-13 NOTE — Progress Notes (Signed)
ANTICOAGULATION CONSULT NOTE - Follow Up Consult  Pharmacy Consult for heparin Indication: chest pain/ACS  Labs:  Recent Labs  10/13/12 0350  HGB 11.8*  HCT 34.1*  PLT 262  HEPARINUNFRC <0.10*  CREATININE 3.04*  TROPONINI <0.30    Assessment: 42yo male undetectable on heparin as expected with dosing from OSH and prior requirement of high rate.  Goal of Therapy:  Heparin level 0.3-0.7 units/ml   Plan:  Will rebolus with 3000 units of heparin x1 and increase gtt to 1600 units/hr and check level in 8hr.  Vernard Gambles, PharmD, BCPS  10/13/2012,5:13 AM

## 2012-10-13 NOTE — Care Management Note (Addendum)
  Page 1 of 1   10/13/2012     10:49:04 AM   CARE MANAGEMENT NOTE 10/13/2012  Patient:  [name], [name]   Account Number:  [id number]  Date Initiated:  10/13/2012  Documentation initiated by:  [facility]  Subjective/Objective Assessment:   42 yo male with HTN, DM, CKD, CAD, ICM (EF 40%) and poor medical adherence who presents as a transfer from [facility] hospital for NSTEMI.     Action/Plan:   NTG gtts, hep gtts/ home with spouse   Anticipated DC Date:  10/14/2012   Anticipated DC Plan:  HOME W HOME HEALTH SERVICES      DC Planning Services  CM consult      Choice offered to / List presented to:             Status of service:  Completed, signed off Medicare Important Message given?   (If response is "NO", the following Medicare IM given date fields will be blank) Date Medicare IM given:   Date Additional Medicare IM given:    Discharge Disposition:    Per UR Regulation:  Reviewed for med. necessity/level of care/duration of stay  If discussed at Long Length of Stay Meetings, dates discussed:    Comments:  10/13/2012 @ 1030.Marland KitchenMarland KitchenSpoke with pt regarding medication assistance.  CM suggested pt change pharmacy to [facility] or [facility] to take advantage of $4 prescription plan. Pt states he will do that as he uses [facility] and medications are costly there.

## 2012-10-13 NOTE — Progress Notes (Signed)
Utilization Review Completed Jahniyah Revere J. Mohamadou Maciver, RN, BSN, NCM [phone]  

## 2012-10-13 NOTE — H&P (Addendum)
Admission History and Physical   Patient ID: [name], MRN: [id number], DOB: 1970/07/14 42 y.o. Date of Encounter: 10/13/2012, 1:32 AM  Primary Physician: Orbie Hurst, NP Primary Cardiologist: Shirlee Latch  Chief Complaint: chest pain  History of Present Illness: [name] is a 42 y.o. male with HTN, DM, CKD, CAD, ICM (EF 40%) and poor medical adherence who presents as a transfer from [facility] for NSTEMI.    The patient was weeding his yard today and noted 10/10 chest pressure radiating to L arm with SOB, and presented to [facility] hospital.  At [facility] his BP was elevated and he was found to have acute on chronic renal failure (Cr. 3.3 from 1.9) and a troponin of 0.08 (ULN 0.03).  Received ASA 324mg  and heparin gtt.  On arrival to [facility], he was hypertensive to 180/110, with mild-mod chest pain, and nitroglycerin gtt was started.     He had a similar presentation in November 2013, with positive enzymes, and Cath here revealed 3VD with distal occlusions not amenable to PCI.  He has significant stressors at home including financial issues, and is not able to affort his medications.  The only medication he routinely takes is amlodipine, he has not taken plavix in over a month, and has not taken metoprolol lately either.    He is quite despondent on my history, saying that he doesn't even know why he should bother getting treated, saying he would rather die then go on dialysis, and that he needs to get home soon so he can pay his bills.  He denies thoughts of or plans to hurt himself.  He continues to smoke cigarettes.    ROS with orthopnea and increased abdominal fluid.  Otherwise negative.     Past Medical History  Diagnosis Date  . Essential hypertension, benign   . Mixed hyperlipidemia   . Coronary atherosclerosis of native coronary artery     100% apical LAD and distal OM1, Rx medically - 11/13  . Cardiomyopathy     LVEF 40-45%  . RBBB   . PAD (peripheral artery  disease)     Normal ABIs, 2011; focal left EIA stenosis; mild bilateral distal SFA disease, 2008  . CHF (congestive heart failure)   . Anginal pain   . NSTEMI (non-ST elevated myocardial infarction) 02/2012  . Type 2 diabetes mellitus   . History of blood transfusion 1988    "arm went thru glass window" (10/12/2012)  . CKD (chronic kidney disease) stage 3, GFR 30-59 ml/min   . Gouty arthritis   . Depression      Past Surgical History  Procedure Laterality Date  . Arm surgery Right [phone]    "@ least 3 ORs; took nerves out of my left leg and put them in my arm" (10/12/2012)  . Cardiac catheterization  03/28/12    20% prox and mid LAD, 100% distal LAD, 30% prox OM, 100% distal OM, 40% prox RCA, 40% mid RCA, 25% distal RCA, small PDA w/ 80% diffuse dz, PLB small w/ 50% diffuse stenoses. No focal lesions for PCI. Recommendations for continued medical management and aggressive RF reduction  . Refractive surgery Bilateral ~ 2005  . Eye surgery Left 1990's    "blood vessel ruptured" (10/12/2012)      Current Facility-Administered Medications  Medication Dose Route Frequency Provider Last Rate Last Dose  . Chlorhexidine Gluconate Cloth 2 % PADS 6 each  6 each Topical Q0600 Laurey Morale, MD      .  mupirocin ointment (BACTROBAN) 2 % 1 application  1 application Nasal BID Laurey Morale, MD      . nitroGLYCERIN 0.2 mg/mL in dextrose 5 % infusion  2-200 mcg/min Intravenous Titrated Laurey Morale, MD 6 mL/hr at 10/13/12 0100 20 mcg/min at 10/13/12 0100      Allergies: Allergies  Allergen Reactions  . Vytorin (Ezetimibe-Simvastatin)     Weakness, muscle aches bad.     Social History:  The patient  reports that he has been smoking Cigarettes.  He has a 12.5 pack-year smoking history. He has never used smokeless tobacco. He reports that  drinks alcohol. He reports that he uses illicit drugs (Cocaine, "Crack" cocaine, and Marijuana).   Family History:  The patient's family history  includes Heart attack (age of onset: 88) in his father.   ROS:  Please see the history of present illness.   All other systems reviewed and negative.   Vital Signs: Blood pressure 173/98, pulse 107, temperature 97.6 F (36.4 C), temperature source Oral, resp. rate 20, height 5\' 9"  (1.753 m), weight 104.4 kg (230 lb 2.6 oz), SpO2 99.00%.  PHYSICAL EXAM: General:  Well nourished, well developed, in no acute distress HEENT: normal Lymph: no adenopathy Neck: Difficult exam but appears elevated to about 12cm with HJR Endocrine:  No thryomegaly Vascular: No carotid bruits; FA pulses 2+ bilaterally without bruits Cardiac:  normal S1, S2; RRR; no murmur Lungs:  clear to auscultation bilaterally, no wheezing, rhonchi or rales Abd: soft, nontender, no hepatomegaly Ext: no edema Musculoskeletal:  No deformities, BUE and BLE strength normal and equal Skin: warm and dry Neuro:  CNs 2-12 intact, no focal abnormalities noted Psych:  Normal affect   EKG:   RBBB old, no acute changes.  Labs:   Lab Results  Component Value Date   WBC 8.4 03/29/2012   HGB 10.8* 03/29/2012   HCT 31.6* 03/29/2012   MCV 84.7 03/29/2012   PLT 295 03/29/2012   No results found for this basename: NA, K, CL, CO2, BUN, CREATININE, CALCIUM, LABALBU, PROT, BILITOT, ALKPHOS, ALT, AST, GLUCOSE,  in the last 168 hours No results found for this basename: CKTOTAL, CKMB, TROPONINI,  in the last 72 hours Lab Results  Component Value Date   CHOL 320* 03/28/2012   HDL 33* 03/28/2012   LDLCALC 224* 03/28/2012   TRIG 314* 03/28/2012   No results found for this basename: DDIMER     OSH: Cr 3.3 (1.9 in Nov 2013) Trop 0.08   Radiology/Studies:  No results found.   ASSESSMENT AND PLAN:   1. NSTEMI  - Elevated Troponin in the setting of acute on chronic renal failure and hypertension  - Chest pain earlier today certainly concerning for ACS  - Given his diffuse distal vessel disease on cath in November, along with his  kidney injury, will defer on catheterization for now.  - His main issue is poor adherence with medications and he still smokes.  Rec:  - Social worker consult for medication assistance, although this looks like it was done already.  - Reload plavix 300mg , continue 75 daily.  Cont ASA, Hep gtt  - BP control with nitro gtt acutely, and norvasc, change metoprolol to coreg for better blood pressure control.  Hold ACE/ARBs given AKI  - hold on cath given AKI, prior findings  - check UDS  - Atorvastatin 80  2. Acute on Chronic Kidney Injury  - He appears volume overloaded, will attempt diuresis  - likely secondary to DM,  HTN, and CHF  - continue to monitor  3. Hypertension  - nitro gtt, norvasc, coreg as above    Signed,  Yaakov Guthrie, MD 10/13/2012, 1:32 AM

## 2012-10-14 DIAGNOSIS — I200 Unstable angina: Secondary | ICD-10-CM

## 2012-10-14 LAB — CBC
HCT: 33.9 % — ABNORMAL LOW (ref 39.0–52.0)
Hemoglobin: 11.4 g/dL — ABNORMAL LOW (ref 13.0–17.0)
MCH: 29 pg (ref 26.0–34.0)
MCHC: 33.6 g/dL (ref 30.0–36.0)
MCV: 86.3 fL (ref 78.0–100.0)
Platelets: 264 K/uL (ref 150–400)
RBC: 3.93 MIL/uL — ABNORMAL LOW (ref 4.22–5.81)
RDW: 12.6 % (ref 11.5–15.5)
WBC: 7.8 K/uL (ref 4.0–10.5)

## 2012-10-14 LAB — GLUCOSE, CAPILLARY: Glucose-Capillary: 239 mg/dL — ABNORMAL HIGH (ref 70–99)

## 2012-10-14 MED ORDER — AMLODIPINE BESYLATE 10 MG PO TABS: 10.0000 mg | ORAL_TABLET | Freq: Every day | ORAL | Status: DC

## 2012-10-14 MED ORDER — NITROGLYCERIN 0.4 MG SL SUBL: 0.4000 mg | SUBLINGUAL_TABLET | SUBLINGUAL | Status: DC | PRN

## 2012-10-14 MED ORDER — ASPIRIN EC 81 MG PO TBEC: 81.0000 mg | DELAYED_RELEASE_TABLET | Freq: Every day | ORAL | Status: DC

## 2012-10-14 MED ORDER — CLOPIDOGREL BISULFATE 75 MG PO TABS: 75.0000 mg | ORAL_TABLET | Freq: Every day | ORAL | Status: DC

## 2012-10-14 MED ORDER — CARVEDILOL 12.5 MG PO TABS: 12.5000 mg | ORAL_TABLET | Freq: Two times a day (BID) | ORAL | Status: DC

## 2012-10-14 MED ORDER — ISOSORBIDE MONONITRATE ER 30 MG PO TB24: 30.0000 mg | ORAL_TABLET | Freq: Every day | ORAL | Status: DC

## 2012-10-14 MED ORDER — FUROSEMIDE 40 MG PO TABS: 40.0000 mg | ORAL_TABLET | Freq: Every day | ORAL | Status: DC

## 2012-10-14 MED ORDER — GLIPIZIDE 5 MG PO TABS: 5.0000 mg | ORAL_TABLET | Freq: Every day | ORAL | Status: DC

## 2012-10-14 NOTE — Discharge Summary (Signed)
Discharge Summary   Patient ID: [name], MRN: [id number], DOB/AGE: Sep 28, 1970 42 y.o.  Admit date: 10/12/2012 Discharge date: 10/14/2012   Primary Care Physician:  Orbie Hurst   Primary Cardiologist:  Dr. Nona Dell [facility])  Reason for Admission:  Unstable Angina  Primary Discharge Diagnoses:  1. Unstable Angina - resolved prior to d/c 2. CAD - medical therapy 3. Acute on Chronic Renal Failure - creatinine stable at d/c 4. Hypertension 5. Hyperlipidemia 6. Tobacco Abuse  Secondary Discharge Diagnoses:   Past Medical History  Diagnosis Date  . Essential hypertension, benign   . Mixed hyperlipidemia   . Coronary atherosclerosis of native coronary artery     100% apical LAD and distal OM1, Rx medically - 11/13  . Cardiomyopathy     LVEF 40-45%  . RBBB   . PAD (peripheral artery disease)     Normal ABIs, 2011; focal left EIA stenosis; mild bilateral distal SFA disease, 2008  . CHF (congestive heart failure)   . Anginal pain   . NSTEMI (non-ST elevated myocardial infarction) 02/2012  . Type 2 diabetes mellitus   . History of blood transfusion 1988    "arm went thru glass window" (10/12/2012)  . CKD (chronic kidney disease) stage 3, GFR 30-59 ml/min   . Gouty arthritis   . Depression      Allergies:    Allergies  Allergen Reactions  . Vytorin (Ezetimibe-Simvastatin)     Weakness, muscle aches bad.      Procedures Performed This Admission:     Hospital Course:  [name] is a 42 y.o. male with a history of CAD, ischemic cardiomyopathy with EF 40%, DM2, HTN, CKD. He presented in 03/2012 with a non-STEMI. Cardiac catheterization demonstrated distal vessel and small vessel disease not amenable to PCI and he was treated medically. He was transferred from [facility] for admission after developing chest discomfort with exertion. Creatinine was noted to be markedly elevated from prior (1.9=>3.3). His troponin was abnormal at 0.08. He was transferred to  [facility] for further evaluation and management. Of note, the patient had been off most of his medications due to cost. Patient was placed back on Plavix, aspirin and heparin. He was placed back on Norvasc and Coreg for blood pressure control. Started on Lipitor.   Patient was observed over the next 48 hrs.  CM recommended getting rx at [facility].  His medications were adjusted.  Follow up troponin at [facility] was negative.  Creatinine remained stable.  Patient was evaluated by Dr. Lewayne Bunting today and felt to be ready for d/c to home.  Prescriptions will be adjusted at d/c for cost.  Lipitor will be changed to Pravastatin.  Of note, nephrologist placed him on Lasix.  Will resume Lasix 40 mg QD at d/c and I have asked the patient to f/u with Dr. Kristian Covey this week.  I will also arrange for a f/u BMET this week in [place] and early cardiology f/u as well.   Discharge Vitals:   Blood pressure 141/62, pulse 75, temperature 98.4 F (36.9 C), temperature source Oral, resp. rate 16, height 5\' 9"  (1.753 m), weight 223 lb 8.7 oz (101.4 kg), SpO2 100.00%.  Labs:  Recent Labs  10/13/12 0350 10/14/12 0525  WBC 8.0 7.8  HGB 11.8* 11.4*  HCT 34.1* 33.9*  MCV 85.3 86.3  PLT 262 264    Recent Labs  10/13/12 0350  NA 136  K 3.9  CL 101  CO2 26  BUN 30*  CREATININE 3.04*  CALCIUM 8.7    Recent Labs  10/13/12 0350  TROPONINI <0.30   Lab Results  Component Value Date   CHOL 268* 10/13/2012   HDL 34* 10/13/2012   LDLCALC UNABLE TO CALCULATE IF TRIGLYCERIDE OVER 400 mg/dL 05/15/1094   TRIG 045* 4/0/9811    Diagnostic Procedures and Studies:  No results found.   Disposition:   Pt is being discharged home today in good condition.  Follow-up Plans & Appointments      Follow-up Information   Follow up with [facility] (near [place]) In 3 days. (office will call to arrange lab test (BMET))    Contact information:   [address], [address] [place] [address] [phone]        Follow up with Nona Dell, MD In 1 week. (office will call to arrange appointmet )    Contact information:   [address] [place] [address] [phone]       Follow up with [facility], MD. Schedule an appointment as soon as possible for a visit in 1 week. (please call to set up follow up appointment this week)    Contact information:   [address] [place] [address] [phone]       Discharge Medications    Medication List    STOP taking these medications       atorvastatin 80 MG tablet  Commonly known as:  LIPITOR     metoprolol succinate 100 MG 24 hr tablet  Commonly known as:  TOPROL-XL      TAKE these medications       acetaminophen 500 MG tablet  Commonly known as:  TYLENOL  Take 500 mg by mouth every 6 (six) hours as needed. For pain     amLODipine 10 MG tablet  Commonly known as:  NORVASC  Take 1 tablet (10 mg total) by mouth daily.     aspirin EC 81 MG tablet  Take 1 tablet (81 mg total) by mouth daily.     carvedilol 12.5 MG tablet  Commonly known as:  COREG  Take 1 tablet (12.5 mg total) by mouth 2 (two) times daily with a meal.     clopidogrel 75 MG tablet  Commonly known as:  PLAVIX  Take 1 tablet (75 mg total) by mouth daily with breakfast.     furosemide 40 MG tablet  Commonly known as:  LASIX  Take 1 tablet (40 mg total) by mouth daily.     glipiZIDE 5 MG tablet  Commonly known as:  GLUCOTROL  Take 1 tablet (5 mg total) by mouth daily.     isosorbide mononitrate 30 MG 24 hr tablet  Commonly known as:  IMDUR  Take 1 tablet (30 mg total) by mouth daily.     nitroGLYCERIN 0.4 MG SL tablet  Commonly known as:  NITROSTAT  Place 1 tablet (0.4 mg total) under the tongue every 5 (five) minutes as needed for chest pain.         Outstanding Labs/Studies  1. BMET Monday 6/9 or Tuesday 6/10  Duration of Discharge Encounter: Greater than 30 minutes including physician and PA  time.  SignedTereso Newcomer, PA-C  11:59 AM 10/14/2012

## 2012-10-14 NOTE — Progress Notes (Signed)
CARDIAC REHAB PHASE I   PRE:  Rate/Rhythm:74  BP:  Supine:   Sitting:130/88   Standing:    SaO2:   MODE:  Ambulation: 200 ft   POST:  Rate/Rhythem:   BP:  Supine:   Sitting: 147/83  Standing:    SaO2:  9:20- -10:07  Patient ambulated with assist x 1.  He complained of leg pain 8/10 while ambulating.  We practiced and demonstrated pacing his walk and rest breaks.  15 min with patient counseling on Smoking Cessation.  NTG use discussed, he has not been able to afford.  We discussed calling his insurance, possible changing pharmacy.  I will also discuss with his nurse to review other options with him prior to discharge.   Education completed [facility] RN  Vinetta Bergamo, Lavon Paganini

## 2012-10-14 NOTE — Care Management Note (Signed)
Cm spoke with patient at beside with spouse present concerning discharge planning. Cm consult for medication assistance. Pt informed ineligible for MATCH Assistance due to having private insurance. CM discussed pt's discharge rx with patient, all present on the [facility] generic drug list provided to pt as a resource. Pt informed of [facility] prescription savings program which offers free diabetes medications. Patient and spouse thanked CM for information. No needs other needs identified.   Roxy Manns Keilen Kahl,RN,BSN [phone]

## 2012-10-14 NOTE — Progress Notes (Signed)
Patient ID: [name], male   DOB: [birth year], 42 y.o.   MRN: [id number] Subjective:  No chest pain or sob.  Objective:  Vital Signs in the last 24 hours: Temp:  [97.6 F (36.4 C)-98.6 F (37 C)] 98.4 F (36.9 C) (06/07 0700) Pulse Rate:  [71-85] 75 (06/07 0700) Resp:  [16-20] 16 (06/07 0700) BP: (126-161)/(62-92) 141/62 mmHg (06/07 0700) SpO2:  [92 %-100 %] 100 % (06/07 0700) Weight:  [223 lb 8.7 oz (101.4 kg)] 223 lb 8.7 oz (101.4 kg) (06/07 0300)  Intake/Output from previous day: 06/06 0701 - 06/07 0700 In: 976.9 [P.O.:840; I.V.:136.9] Out: 2700 [Urine:2700] Intake/Output from this shift: Total I/O In: 480 [P.O.:480] Out: -   Physical Exam: Well appearing 42 yo man, NAD HEENT: Unremarkable Neck:  7 cm JVD, no thyromegally Lungs:  Clear with no wheezes HEART:  Regular rate rhythm, no murmurs, no rubs, no clicks Abd:  soft, positive bowel sounds, no organomegally, no rebound, no guarding Ext:  2 plus pulses, no edema, no cyanosis, no clubbing Skin:  No rashes no nodules Neuro:  CN II through XII intact, motor grossly intact  Lab Results:  Recent Labs  10/13/12 0350 10/14/12 0525  WBC 8.0 7.8  HGB 11.8* 11.4*  PLT 262 264    Recent Labs  10/13/12 0350  NA 136  K 3.9  CL 101  CO2 26  GLUCOSE 197*  BUN 30*  CREATININE 3.04*    Recent Labs  10/13/12 0350  TROPONINI <0.30   Hepatic Function Panel No results found for this basename: PROT, ALBUMIN, AST, ALT, ALKPHOS, BILITOT, BILIDIR, IBILI,  in the last 72 hours  Recent Labs  10/13/12 0350  CHOL 268*   No results found for this basename: PROTIME,  in the last 72 hours  Imaging: No results found.  Cardiac Studies: Tele - nsr Assessment/Plan:  1. CAD 2. HTN 3. Ongoing tobacco abuse Rec: ok for discharge today. followup with primary cardiologist. I discussed the importance of tobacco cessation.  LOS: 2 days    Juston Goheen,M.D. 10/14/2012, 10:37 AM

## 2012-10-16 ENCOUNTER — Other Ambulatory Visit: Payer: Self-pay | Admitting: *Deleted

## 2012-10-16 DIAGNOSIS — I10 Essential (primary) hypertension: Secondary | ICD-10-CM

## 2012-10-23 ENCOUNTER — Encounter: Payer: Self-pay | Admitting: *Deleted

## 2012-10-23 ENCOUNTER — Encounter: Payer: Self-pay | Admitting: Physician Assistant

## 2012-10-23 ENCOUNTER — Ambulatory Visit (INDEPENDENT_AMBULATORY_CARE_PROVIDER_SITE_OTHER): Payer: PRIVATE HEALTH INSURANCE | Admitting: Physician Assistant

## 2012-10-23 VITALS — BP 128/83 | HR 103 | Ht 69.0 in | Wt 219.0 lb

## 2012-10-23 DIAGNOSIS — I255 Ischemic cardiomyopathy: Secondary | ICD-10-CM

## 2012-10-23 DIAGNOSIS — F17200 Nicotine dependence, unspecified, uncomplicated: Secondary | ICD-10-CM

## 2012-10-23 DIAGNOSIS — Z720 Tobacco use: Secondary | ICD-10-CM

## 2012-10-23 DIAGNOSIS — I2510 Atherosclerotic heart disease of native coronary artery without angina pectoris: Secondary | ICD-10-CM

## 2012-10-23 DIAGNOSIS — I2589 Other forms of chronic ischemic heart disease: Secondary | ICD-10-CM

## 2012-10-23 DIAGNOSIS — I10 Essential (primary) hypertension: Secondary | ICD-10-CM

## 2012-10-23 DIAGNOSIS — E785 Hyperlipidemia, unspecified: Secondary | ICD-10-CM

## 2012-10-23 MED ORDER — OMEGA-3 FATTY ACIDS 1000 MG PO CAPS
2.0000 g | ORAL_CAPSULE | Freq: Two times a day (BID) | ORAL | Status: DC
Start: 2012-10-23 — End: 2013-05-23

## 2012-10-23 MED ORDER — CARVEDILOL 12.5 MG PO TABS: 18.7500 mg | ORAL_TABLET | Freq: Two times a day (BID) | ORAL | Status: DC

## 2012-10-23 MED ORDER — ISOSORBIDE MONONITRATE ER 30 MG PO TB24: 15.0000 mg | ORAL_TABLET | Freq: Every day | ORAL | Status: DC

## 2012-10-23 NOTE — Assessment & Plan Note (Addendum)
Continued aggressive medical management and risk factor modification recommended, including complete smoking cessation. Patient now reports compliance with his medications, including resumption of Plavix. Will decrease Imdur to 15 mg daily, following development of HA. Of note, patient is to remain on DAPT through 03/2013. He has been cleared to resume working, without restriction.

## 2012-10-23 NOTE — Patient Instructions (Addendum)
   Labs in 12 weeks:  FLP/LFT - will send reminder in mail  Decrease Imdur to 15mg  daily  Increase Fish Oil to 2 gms (2000mg ) twice a day    Increase Coreg to 18.75mg  twice a day - new sent to pharm   Nurse visit in 2 weeks for blood pressure and heart rate check  Continue all other current medications. Follow up in  4 months

## 2012-10-23 NOTE — Assessment & Plan Note (Signed)
Patient reportedly was taken off Lipitor recently, by Dr. Kristian Covey. I instructed him to further clarify this with him at time of scheduled followup within the next several weeks. Meanwhile, we'll increase omega-3 fish oil to 2 g twice a day and reassess lipid status in 12 weeks. Of note, I would try to maintain him on a low-dose statin, while targeting his hypertriglyceridemia, if possible.

## 2012-10-23 NOTE — Progress Notes (Signed)
Primary Cardiologist: Simona Huh, MD   HPI: Post hospital followup from [facility], status post presentation with CP. Followup troponins NL. No further cardiac workup recommended.   Patient initially transferred from [facility] ED following abnormal troponin 0.08, but in setting of acute/chronic CKD (creatinine 3.3). Also noted to have been off most medications, notably Plavix.  Clinically, he is feeling better and denies any frank exertional CP. Reports compliance with his medications, although he has developed a HA following the addition of Imdur.   He has been seen by Dr. Kristian Covey in followup, who reportedly took him off Lipitor in the recent past, due to his underlying kidney disease. He reportedly is on Omega-3 fish oil. Of note, his recent lipid profile yielded Chol 268, HDL 34, and triglycerides 161.  Unfortunately, patient continues to smoke, albeit 3-4 cigarettes a day, but has significantly cut back.  Allergies  Allergen Reactions  . Vytorin (Ezetimibe-Simvastatin)     Weakness, muscle aches bad.    Current Outpatient Prescriptions  Medication Sig Dispense Refill  . acetaminophen (TYLENOL) 500 MG tablet Take 500 mg by mouth every 6 (six) hours as needed. For pain      . amLODipine (NORVASC) 10 MG tablet Take 1 tablet (10 mg total) by mouth daily.  30 tablet  5  . aspirin EC 81 MG tablet Take 1 tablet (81 mg total) by mouth daily.      . carvedilol (COREG) 12.5 MG tablet Take 1.5 tablets (18.75 mg total) by mouth 2 (two) times daily with a meal.  90 tablet  6  . cholecalciferol (VITAMIN D) 1000 UNITS tablet Take 1,000 Units by mouth daily.      . clopidogrel (PLAVIX) 75 MG tablet Take 1 tablet (75 mg total) by mouth daily with breakfast.  30 tablet  6  . furosemide (LASIX) 40 MG tablet Take 1 tablet (40 mg total) by mouth daily.  30 tablet  0  . glipiZIDE (GLUCOTROL) 5 MG tablet Take 1 tablet (5 mg total) by mouth daily.  30 tablet  1  . isosorbide mononitrate (IMDUR) 30 MG 24 hr tablet  Take 0.5 tablets (15 mg total) by mouth daily.      . nitroGLYCERIN (NITROSTAT) 0.4 MG SL tablet Place 1 tablet (0.4 mg total) under the tongue every 5 (five) minutes as needed for chest pain.  25 tablet  11  . fish oil-omega-3 fatty acids 1000 MG capsule Take 2 capsules (2 g total) by mouth 2 (two) times daily.       No current facility-administered medications for this visit.    Past Medical History  Diagnosis Date  . Essential hypertension, benign   . Mixed hyperlipidemia   . Coronary atherosclerosis of native coronary artery     100% apical LAD and distal OM1, Rx medically - 11/13  . Ischemic cardiomyopathy     LVEF 40-45%  . RBBB   . PAD (peripheral artery disease)     Normal ABIs, 2011; focal left EIA stenosis; mild bilateral distal SFA disease, 2008  . CHF (congestive heart failure)   . Anginal pain   . NSTEMI (non-ST elevated myocardial infarction) 02/2012  . Type 2 diabetes mellitus   . History of blood transfusion 1988    "arm went thru glass window" (10/12/2012)  . CKD (chronic kidney disease) stage 3, GFR 30-59 ml/min   . Gouty arthritis   . Depression     Past Surgical History  Procedure Laterality Date  . Arm surgery Right [id number]    "@  least 3 ORs; took nerves out of my left leg and put them in my arm" (10/12/2012)  . Cardiac catheterization  03/28/12    20% prox and mid LAD, 100% distal LAD, 30% prox OM, 100% distal OM, 40% prox RCA, 40% mid RCA, 25% distal RCA, small PDA w/ 80% diffuse dz, PLB small w/ 50% diffuse stenoses. No focal lesions for PCI. Recommendations for continued medical management and aggressive RF reduction  . Refractive surgery Bilateral ~ 2005  . Eye surgery Left 1990's    "blood vessel ruptured" (10/12/2012)    History   Social History  . Marital Status: Married    Spouse Name: N/A    Number of Children: N/A  . Years of Education: N/A   Occupational History  . Not on file.   Social History Main Topics  . Smoking status: Current  Every Day Smoker -- 0.50 packs/day for 25 years    Types: Cigarettes  . Smokeless tobacco: Never Used  . 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 Drug Use: Yes    Special: Cocaine, "Crack" cocaine, Marijuana     Comment: 10/12/2012 "it's been 14-15 years since I've used cocaine"  . Sexually Active: Yes   Other Topics Concern  . Not on file   Social History Narrative  . No narrative on file   Social History Narrative  . No narrative on file    Problem Relation Age of Onset  . Heart attack Father 50    ROS: no nausea, vomiting; no fever, chills; no melena, hematochezia; no claudication  PHYSICAL EXAM: BP 128/83  Pulse 103  Ht 5\' 9"  (1.753 m)  Wt 219 lb (99.338 kg)  BMI 32.33 kg/m2  SpO2 98% GENERAL: 42 year-old male; NAD HEENT: NCAT, PERRLA, EOMI; sclera clear; no xanthelasma NECK: palpable bilateral carotid pulses, no bruits; no JVD; no TM LUNGS: CTA bilaterally CARDIAC: RRR (S1, S2); no significant murmurs; no rubs or gallops ABDOMEN: soft, non-tender; intact BS EXTREMETIES: no significant peripheral edema SKIN: warm/dry; no obvious rash/lesions MUSCULOSKELETAL: no joint deformity NEURO: no focal deficit; NL affect   EKG: reviewed and available in Electronic Records   ASSESSMENT & PLAN:  Coronary atherosclerosis of native coronary artery Continued aggressive medical management and risk factor modification recommended, including complete smoking cessation. Patient now reports compliance with his medications, including resumption of Plavix. Will decrease Imdur to 15 mg daily, following development of HA. Of note, patient is to remain on DAPT through 03/2013. He has been cleared to resume working, without restriction.  Ischemic cardiomyopathy Will increase carvedilol to 18.75 twice a day and arrange an RN visit in 2 weeks to reassess BP/HR. If stable, continue further up titration to target dose 25 twice a day.  Tobacco  abuse Patient strongly advised to stop smoking completely  Hyperlipidemia Patient reportedly was taken off Lipitor recently, by Dr. Kristian Covey. I instructed him to further clarify this with him at time of scheduled followup within the next several weeks. Meanwhile, we'll increase omega-3 fish oil to 2 g twice a day and reassess lipid status in 12 weeks. Of note, I would try to maintain him on a low-dose statin, while targeting his hypertriglyceridemia, if possible.  Essential hypertension, benign  To be reassessed following increase in carvedilol dose    Gene Jendaya Gossett, PAC

## 2012-10-23 NOTE — Assessment & Plan Note (Signed)
Will increase carvedilol to 18.75 twice a day and arrange an RN visit in 2 weeks to reassess BP/HR. If stable, continue further up titration to target dose 25 twice a day.

## 2012-10-23 NOTE — Assessment & Plan Note (Signed)
Patient strongly advised to stop smoking completely

## 2012-10-23 NOTE — Assessment & Plan Note (Signed)
To be reassessed following increase in carvedilol dose

## 2012-11-06 ENCOUNTER — Ambulatory Visit (INDEPENDENT_AMBULATORY_CARE_PROVIDER_SITE_OTHER): Payer: PRIVATE HEALTH INSURANCE | Admitting: *Deleted

## 2012-11-06 ENCOUNTER — Telehealth: Payer: Self-pay | Admitting: Cardiology

## 2012-11-06 VITALS — BP 142/86 | HR 78 | Resp 18

## 2012-11-06 DIAGNOSIS — I10 Essential (primary) hypertension: Secondary | ICD-10-CM

## 2012-11-06 NOTE — Telephone Encounter (Signed)
Patient has questions - said too many to give to me

## 2012-11-06 NOTE — Addendum Note (Signed)
Addended by: Prescott Parma C on: 11/06/2012 02:49 PM   Modules accepted: Level of Service

## 2012-11-06 NOTE — Progress Notes (Addendum)
Agreed. VSS reviewed, stable following recent increase in Coreg. Therefore, recommend further increasing Coreg to 25 bid, and reassessing at next OV.

## 2012-11-06 NOTE — Progress Notes (Signed)
Patient returns today for a scheduled nurse visit to check blood pressure and pulse.  Blood pressure: 142/86, pulse 78.  Pt states that he has not taken his medications yet today.  Advised pt to take his meds as scheduled and to avoid salty foods.  Pt appears in no distress.  Left awake, alert and ambulatory.

## 2012-11-07 NOTE — Progress Notes (Signed)
Left message on machine for patient to call office.

## 2012-11-08 MED ORDER — CARVEDILOL 25 MG PO TABS: 25.0000 mg | ORAL_TABLET | Freq: Two times a day (BID) | ORAL | Status: DC

## 2012-11-08 NOTE — Telephone Encounter (Signed)
Patient informed. 

## 2012-11-08 NOTE — Addendum Note (Signed)
Addended by: Eustace Moore on: 11/08/2012 02:11 PM   Modules accepted: Orders

## 2012-11-08 NOTE — Telephone Encounter (Signed)
I do not have a reason from a cardiac perspective to grant this patient permanent disability.

## 2012-11-08 NOTE — Progress Notes (Signed)
Patient informed and new prescription sent to [facility] per patient requests.

## 2012-11-08 NOTE — Telephone Encounter (Signed)
Spoke with patient and he wanted to ask the provider about him going on disability. Patient informed nurse that he has quit his job due to his health and wanted to get opinion of him being on disability from a cardiac standpoint. Nurse advised patient that he should contact his PCP and nephrologists about this. Patient said he was advised to also inform his cardiologist.

## 2013-01-09 ENCOUNTER — Encounter: Payer: Self-pay | Admitting: Cardiology

## 2013-01-30 ENCOUNTER — Telehealth: Payer: Self-pay | Admitting: *Deleted

## 2013-01-30 DIAGNOSIS — I2510 Atherosclerotic heart disease of native coronary artery without angina pectoris: Secondary | ICD-10-CM

## 2013-01-30 DIAGNOSIS — Z79899 Other long term (current) drug therapy: Secondary | ICD-10-CM

## 2013-01-30 DIAGNOSIS — E785 Hyperlipidemia, unspecified: Secondary | ICD-10-CM

## 2013-01-30 NOTE — Telephone Encounter (Deleted)
Reminder letter & orders mailed today.

## 2013-01-30 NOTE — Telephone Encounter (Signed)
Message copied by Lesle Chris on Tue Jan 30, 2013  4:57 PM ------      Message from: Lesle Chris      Created: Mon Oct 23, 2012 11:21 AM       FLP, LFT ------

## 2013-02-06 ENCOUNTER — Encounter: Payer: Self-pay | Admitting: Cardiology

## 2013-02-13 ENCOUNTER — Encounter: Payer: Self-pay | Admitting: *Deleted

## 2013-02-13 NOTE — Telephone Encounter (Signed)
Spoke with patient this morning regarding his 4 month follow up appointment.  Last seen in June by Rozell Searing, PA - he advised 4 mo fu.  Recall was placed with Dr. Antoine Poche (no schedules available for this MD currently here in [place]).  Primary cardiologist was listed as Dr. Diona Browner per [facility] dictation.  Patient had never seen SM in the past so new provider assigned to this patient.  Appointment scheduled with Dr. Wyline Mood for 03/01/2013 at 2:00.  Also, informed pt that cholesterol labs were due.  Will mail lab orders today.

## 2013-02-19 ENCOUNTER — Other Ambulatory Visit: Payer: Self-pay | Admitting: Cardiology

## 2013-02-20 ENCOUNTER — Telehealth: Payer: Self-pay | Admitting: Cardiology

## 2013-02-20 LAB — LIPID PANEL
Cholesterol: 255 mg/dL — ABNORMAL HIGH (ref 0–200)
HDL: 32 mg/dL — ABNORMAL LOW (ref >39)
Triglycerides: 545 mg/dL — ABNORMAL HIGH (ref <150)

## 2013-02-20 LAB — HEPATIC FUNCTION PANEL
ALT: 8 U/L (ref 0–53)
AST: 14 U/L (ref 0–37)
Albumin: 3.6 g/dL (ref 3.5–5.2)
Alkaline Phosphatase: 52 U/L (ref 39–117)
Indirect Bilirubin: 0.2 mg/dL (ref 0.0–0.9)
Total Protein: 7 g/dL (ref 6.0–8.3)

## 2013-02-20 NOTE — Telephone Encounter (Signed)
Message copied by Burnice Logan on Tue Feb 20, 2013 10:36 AM ------      Message from: [place] F      Created: Tue Feb 20, 2013 10:08 AM       The patient's triglycerides are elevated. His cholesterol is handled by Dr Linton Flemings, he needs to follow up with him. Please send a copy of these results to his office. ------

## 2013-02-20 NOTE — Telephone Encounter (Signed)
Pt called and said he called Dr. Linton Flemings office and they said they did not do cholesterol control. I informed him that I would route this to his PCP Quentin Mulling.

## 2013-02-20 NOTE — Telephone Encounter (Signed)
Pt informed and labs routed to Dr. Linton Flemings. Informed pt that if he has not heard anything from this office by Friday to give them a call.

## 2013-03-01 ENCOUNTER — Ambulatory Visit (INDEPENDENT_AMBULATORY_CARE_PROVIDER_SITE_OTHER): Payer: PRIVATE HEALTH INSURANCE | Admitting: Cardiology

## 2013-03-01 ENCOUNTER — Encounter: Payer: Self-pay | Admitting: Cardiology

## 2013-03-01 VITALS — BP 158/93 | HR 87 | Ht 69.0 in | Wt 228.0 lb

## 2013-03-01 DIAGNOSIS — I255 Ischemic cardiomyopathy: Secondary | ICD-10-CM

## 2013-03-01 DIAGNOSIS — I2589 Other forms of chronic ischemic heart disease: Secondary | ICD-10-CM

## 2013-03-01 MED ORDER — CARVEDILOL 25 MG PO TABS: 25.0000 mg | ORAL_TABLET | Freq: Two times a day (BID) | ORAL | Status: DC

## 2013-03-01 MED ORDER — PRAVASTATIN SODIUM 40 MG PO TABS: 40.0000 mg | ORAL_TABLET | Freq: Every evening | ORAL | Status: DC

## 2013-03-01 NOTE — Progress Notes (Signed)
Clinical Summary Mr. Honaker is a 42 y.o.male  1. CAD/ICM -prior cath 100% apical LAD stenosis, medically managed - denies any chest pain. Exertion limited by lower extremity pain. Sedentary lifestyle. No othopnea, no PND, occas LE edema. - ASA, plavix, coreg, imdur. Per notes plan to continue plavix until 03/2013.   2. HL - lipitor stopped by Dr Kristian Covey in the past according to prior cardiology notes. The exact history of this is unclear to me. A discharge summary dated June 2014 indicated stopping atorvastatin for pravastatin for cost issues, but pravastatin was not prescribed at that time.  - previous documentation reports myalgias on sima/zetia, but patient reports he tolerated lipitor fine.   3. HTN - does not check at home - compliant with meds  4. Leg pain - bilateral calf pain, in 2011 right 1.32 left 1.08.  - reports repeat ABI recently for disability, but we do not have those results  5. Tobacco - smoking 3 cigs per day, trying to quit   Past Medical History  Diagnosis Date  . Essential hypertension, benign   . Mixed hyperlipidemia   . Coronary atherosclerosis of native coronary artery     100% apical LAD and distal OM1, Rx medically - 11/13  . Ischemic cardiomyopathy     LVEF 40-45%  . RBBB   . PAD (peripheral artery disease)     Normal ABIs, 2011; focal left EIA stenosis; mild bilateral distal SFA disease, 2008  . CHF (congestive heart failure)   . Anginal pain   . NSTEMI (non-ST elevated myocardial infarction) 02/2012  . Type 2 diabetes mellitus   . History of blood transfusion 1988    "arm went thru glass window" (10/12/2012)  . CKD (chronic kidney disease) stage 3, GFR 30-59 ml/min   . Gouty arthritis   . Depression      Allergies  Allergen Reactions  . Vytorin [Ezetimibe-Simvastatin]     Weakness, muscle aches bad.     Current Outpatient Prescriptions  Medication Sig Dispense Refill  . acetaminophen (TYLENOL) 500 MG tablet Take 500 mg by  mouth every 6 (six) hours as needed. For pain      . amLODipine (NORVASC) 10 MG tablet Take 1 tablet (10 mg total) by mouth daily.  30 tablet  5  . aspirin EC 81 MG tablet Take 1 tablet (81 mg total) by mouth daily.      . carvedilol (COREG) 25 MG tablet Take 1 tablet (25 mg total) by mouth 2 (two) times daily.  180 tablet  3  . cholecalciferol (VITAMIN D) 1000 UNITS tablet Take 1,000 Units by mouth daily.      . clopidogrel (PLAVIX) 75 MG tablet Take 1 tablet (75 mg total) by mouth daily with breakfast.  30 tablet  6  . fish oil-omega-3 fatty acids 1000 MG capsule Take 2 capsules (2 g total) by mouth 2 (two) times daily.      . furosemide (LASIX) 40 MG tablet Take 1 tablet (40 mg total) by mouth daily.  30 tablet  0  . glipiZIDE (GLUCOTROL) 5 MG tablet Take 1 tablet (5 mg total) by mouth daily.  30 tablet  1  . isosorbide mononitrate (IMDUR) 30 MG 24 hr tablet Take 0.5 tablets (15 mg total) by mouth daily.      . nitroGLYCERIN (NITROSTAT) 0.4 MG SL tablet Place 1 tablet (0.4 mg total) under the tongue every 5 (five) minutes as needed for chest pain.  25 tablet  11  No current facility-administered medications for this visit.     Past Surgical History  Procedure Laterality Date  . Arm surgery Right [phone]    "@ least 3 ORs; took nerves out of my left leg and put them in my arm" (10/12/2012)  . Cardiac catheterization  03/28/12    20% prox and mid LAD, 100% distal LAD, 30% prox OM, 100% distal OM, 40% prox RCA, 40% mid RCA, 25% distal RCA, small PDA w/ 80% diffuse dz, PLB small w/ 50% diffuse stenoses. No focal lesions for PCI. Recommendations for continued medical management and aggressive RF reduction  . Refractive surgery Bilateral ~ 2005  . Eye surgery Left 1990's    "blood vessel ruptured" (10/12/2012)     Allergies  Allergen Reactions  . Vytorin [Ezetimibe-Simvastatin]     Weakness, muscle aches bad.      Family History  Problem Relation Age of Onset  . Heart attack  Father 38     Social History Mr. Siwek reports that he has been smoking Cigarettes.  He has a 12.5 pack-year smoking history. He has never used smokeless tobacco. Mr. Canavan reports that he drinks alcohol.   Review of Systems CONSTITUTIONAL: No weight loss, fever, chills, weakness or fatigue.  HEENT: Eyes: No visual loss, blurred vision, double vision or yellow sclerae.No hearing loss, sneezing, congestion, runny nose or sore throat.  SKIN: No rash or itching.  CARDIOVASCULAR: per HPI RESPIRATORY:per HPI GASTROINTESTINAL: No anorexia, nausea, vomiting or diarrhea. No abdominal pain or blood.  GENITOURINARY: No burning on urination, no polyuria NEUROLOGICAL: No headache, dizziness, syncope, paralysis, ataxia, numbness or tingling in the extremities. No change in bowel or bladder control.  MUSCULOSKELETAL: No muscle, back pain, joint pain or stiffness.  LYMPHATICS: No enlarged nodes. No history of splenectomy.  PSYCHIATRIC: No history of depression or anxiety.  ENDOCRINOLOGIC: No reports of sweating, cold or heat intolerance. No polyuria or polydipsia.  Marland Kitchen   Physical Examination p 87 bp 144/80 Wt 103 lbs BMI 34 Gen: resting comfortably, no acute distress HEENT: no scleral icterus, pupils equal round and reactive, no palptable cervical adenopathy,  CV: RRR, no m/r/g, no JVD, no carotid bruits Resp: Clear to auscultation bilaterally GI: abdomen is soft, non-tender, non-distended, normal bowel sounds, no hepatosplenomegaly MSK: extremities are warm, no edema.  Skin: warm, no rash Neuro:  no focal deficits Psych: appropriate affect   Diagnostic Studies Cath 03/2012 Hemodynamic Findings:  Central aortic pressure: 150/100  Left ventricular pressure: 148/17/21  Angiographic Findings:  Left main: No obstructive disease.  Left Anterior Descending Artery: Large vessel that coursed to the apex. The proximal and mid vessel had serial 20% lesions. The distal vessel had 100% occlusion just  before the apex. The vessel was 1.75 mm in this location. The diagonal Juvenal Umar was small in caliber and patent with mild diffuse disease.  Circumflex Artery: Large caliber vessel with large first obtuse marginal Danyele Smejkal. There is a 30% stenosis in the proximal portion of the obtuse marginal Gurleen Larrivee. The distal segment of the OM Harlee Eckroth is 100% occluded.  Right Coronary Artery: Large, dominant vessel with 40% proximal stenosis, long tubular 40% mid stenosis, serial 25% distal stenoses. The PDA is very small caliber and has diffuse 80% stenosis. The Posterolateral Laconya Clere is small in caliber and has diffuse 50% stenosis.  Left Ventricular Angiogram: Deferred.  Impression:  1. 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 No focal lesions for PCI  2.  NSTEMI secondary to above.  Recommendations: Will continue medical management with aggressive risk factor reduction. He will need to stop smoking. We will continue statin, beta blocker. Will continue ASA and will load with Plavix. Will check echo to assess LVEF.  Complications: None. The patient tolerated the procedure well.   03/2012 echo LVEF 40-45%, mild LVH, multiple WMAs, grade II diastolic dysfunction,    Assessment and Plan  1. CAD/ICM - recent cath showed diffuse disease as described above, no PCI targets. - patient appears euvolemic today, will titrate up his coreg to 25mg  bid. He is not on ACE or spironolactone because of his significant renal dysfunction  2. HL - unclear history regarding his statins and why he is not currently on one. Last cardiology note indicates may have been stopped by his nephrologist. A June 2014 discharge summary suggests he was going to be switched from lipitor to pravastatin due to cost issues but pravastatin was not included in the discharge medication list - I will start him on pravastatin 40mg  daily, he has significant CAD and he would likely benefit from  this medication based on this.   3. HTN - not at goal, given CKD goal is < 130/80. Follow pressures after increasing coreg.   4. Leg pain - prior normal ABIs - reports recent repeat study as part of disability evaluation, will send for records  5. Tobacco - counseled extensively on tobacco cessation.       Antoine Poche, M.D., F.A.C.C.

## 2013-03-01 NOTE — Patient Instructions (Signed)
Your physician recommends that you schedule a follow-up appointment in: 3 months with Dr. Wyline Mood. This appointment will be scheduled today before you leave.  Your physician has recommended you make the following change in your medication:  Increase Coreg 25 MG twice daily. A prescription has been sent to your pharmacy.  Continue all other medications the same.

## 2013-05-10 HISTORY — PX: COLONOSCOPY: SHX174

## 2013-05-18 ENCOUNTER — Other Ambulatory Visit: Payer: Self-pay | Admitting: Cardiology

## 2013-05-18 ENCOUNTER — Other Ambulatory Visit: Payer: Self-pay | Admitting: Physician Assistant

## 2013-05-18 MED ORDER — CLOPIDOGREL BISULFATE 75 MG PO TABS: 75.0000 mg | ORAL_TABLET | Freq: Every day | ORAL | Status: DC

## 2013-05-23 ENCOUNTER — Encounter: Payer: Self-pay | Admitting: Cardiology

## 2013-05-23 ENCOUNTER — Other Ambulatory Visit: Payer: Self-pay | Admitting: Cardiology

## 2013-05-23 ENCOUNTER — Ambulatory Visit (INDEPENDENT_AMBULATORY_CARE_PROVIDER_SITE_OTHER): Payer: PRIVATE HEALTH INSURANCE | Admitting: Cardiology

## 2013-05-23 VITALS — BP 152/70 | HR 79 | Ht 69.0 in | Wt 233.0 lb

## 2013-05-23 DIAGNOSIS — E785 Hyperlipidemia, unspecified: Secondary | ICD-10-CM

## 2013-05-23 DIAGNOSIS — I10 Essential (primary) hypertension: Secondary | ICD-10-CM

## 2013-05-23 DIAGNOSIS — I2589 Other forms of chronic ischemic heart disease: Secondary | ICD-10-CM

## 2013-05-23 DIAGNOSIS — I255 Ischemic cardiomyopathy: Secondary | ICD-10-CM

## 2013-05-23 DIAGNOSIS — I2510 Atherosclerotic heart disease of native coronary artery without angina pectoris: Secondary | ICD-10-CM

## 2013-05-23 MED ORDER — NICOTINE 14 MG/24HR TD PT24: MEDICATED_PATCH | TRANSDERMAL | Status: DC

## 2013-05-23 MED ORDER — NICOTINE 7 MG/24HR TD PT24: MEDICATED_PATCH | TRANSDERMAL | Status: DC

## 2013-05-23 MED ORDER — PRAVASTATIN SODIUM 40 MG PO TABS: 40.0000 mg | ORAL_TABLET | Freq: Every evening | ORAL | Status: DC

## 2013-05-23 MED ORDER — CARVEDILOL 25 MG PO TABS: 25.0000 mg | ORAL_TABLET | Freq: Two times a day (BID) | ORAL | Status: DC

## 2013-05-23 NOTE — Progress Notes (Signed)
Clinical Summary Mr. Rana SnareLowe is a 43 y.o.male seen today in follow up for the following medical problems.   1. CAD/ICM  -prior cath 100% apical LAD stenosis, medically managed  - LVEF 40-45% by echo 03/2012 - rare chest pain, taken nitro x 2 since last visit.Marland Kitchen. Exertion limited by lower extremity pain. Sedentary lifestyle. No othopnea, no PND, occas LE edema.   - ASA, plavix, coreg, imdur. Per notes plan to continue plavix until 03/2013, he is still currently taking.  - last visit asked to increase coreg to 25mg  bid, appears he is still taking 18.75mg  bid.    2. HL  - reports hard time affording medications, last visit changed to pravastatin. Reports he still has not been able to start.   3. HTN  - does not check at home  - compliant with meds   4. Leg pain  - bilateral calf pain, in 2011 right 1.32 left 1.08.  - reports repeat ABI recently for disability, but we do not have those results   5. CKD - followed by Dr Elder CyphersBefekada, considering starting dialysis.  - Last panel we have 01/2013 showed Cr of 3.31, GFR 20.   6. Tobacco  - smoking 3 cigs per day, trying to quit   Past Medical History  Diagnosis Date  . Essential hypertension, benign   . Mixed hyperlipidemia   . Coronary atherosclerosis of native coronary artery     100% apical LAD and distal OM1, Rx medically - 11/13  . Ischemic cardiomyopathy     LVEF 40-45%  . RBBB   . PAD (peripheral artery disease)     Normal ABIs, 2011; focal left EIA stenosis; mild bilateral distal SFA disease, 2008  . CHF (congestive heart failure)   . Anginal pain   . NSTEMI (non-ST elevated myocardial infarction) 02/2012  . Type 2 diabetes mellitus   . History of blood transfusion 1988    "arm went thru glass window" (10/12/2012)  . CKD (chronic kidney disease) stage 3, GFR 30-59 ml/min   . Gouty arthritis   . Depression      Allergies  Allergen Reactions  . Vytorin [Ezetimibe-Simvastatin]     Weakness, muscle aches bad.      Current Outpatient Prescriptions  Medication Sig Dispense Refill  . acetaminophen (TYLENOL) 500 MG tablet Take 500 mg by mouth every 6 (six) hours as needed. For pain      . amLODipine (NORVASC) 5 MG tablet Take 5 mg by mouth daily.      Marland Kitchen. aspirin EC 81 MG tablet Take 81 mg by mouth daily as needed.      . carvedilol (COREG) 25 MG tablet Take 1 tablet (25 mg total) by mouth 2 (two) times daily with a meal.  60 tablet  6  . cholecalciferol (VITAMIN D) 1000 UNITS tablet Take 1,000 Units by mouth daily.      . clopidogrel (PLAVIX) 75 MG tablet Take 1 tablet (75 mg total) by mouth daily with breakfast.  30 tablet  6  . clopidogrel (PLAVIX) 75 MG tablet Take 1 tablet (75 mg total) by mouth daily with breakfast.  30 tablet  6  . fish oil-omega-3 fatty acids 1000 MG capsule Take 2 capsules (2 g total) by mouth 2 (two) times daily.      . furosemide (LASIX) 40 MG tablet Take 40 mg by mouth 2 (two) times daily.      Marland Kitchen. glipiZIDE (GLUCOTROL) 5 MG tablet Take 1 tablet (5 mg  total) by mouth daily.  30 tablet  1  . isosorbide mononitrate (IMDUR) 30 MG 24 hr tablet Take 30 mg by mouth daily.      . nitroGLYCERIN (NITROSTAT) 0.4 MG SL tablet Place 1 tablet (0.4 mg total) under the tongue every 5 (five) minutes as needed for chest pain.  25 tablet  11  . pravastatin (PRAVACHOL) 40 MG tablet Take 1 tablet (40 mg total) by mouth every evening.  90 tablet  3   No current facility-administered medications for this visit.     Past Surgical History  Procedure Laterality Date  . Arm surgery Right [phone]    "@ least 3 ORs; took nerves out of my left leg and put them in my arm" (10/12/2012)  . Cardiac catheterization  03/28/12    20% prox and mid LAD, 100% distal LAD, 30% prox OM, 100% distal OM, 40% prox RCA, 40% mid RCA, 25% distal RCA, small PDA w/ 80% diffuse dz, PLB small w/ 50% diffuse stenoses. No focal lesions for PCI. Recommendations for continued medical management and aggressive RF reduction  .  Refractive surgery Bilateral ~ 2005  . Eye surgery Left 1990's    "blood vessel ruptured" (10/12/2012)     Allergies  Allergen Reactions  . Vytorin [Ezetimibe-Simvastatin]     Weakness, muscle aches bad.      Family History  Problem Relation Age of Onset  . Heart attack Father 67     Social History Mr. Muse reports that he has been smoking Cigarettes.  He has a 12.5 pack-year smoking history. He has never used smokeless tobacco. Mr. Goodridge reports that he drinks alcohol.   Review of Systems CONSTITUTIONAL: No weight loss, fever, chills, weakness or fatigue.  HEENT: Eyes: No visual loss, blurred vision, double vision or yellow sclerae.No hearing loss, sneezing, congestion, runny nose or sore throat.  SKIN: No rash or itching.  CARDIOVASCULAR: per HPI RESPIRATORY: No shortness of breath, cough or sputum.  GASTROINTESTINAL: No anorexia, nausea, vomiting or diarrhea. No abdominal pain or blood.  GENITOURINARY: No burning on urination, no polyuria NEUROLOGICAL: No headache, dizziness, syncope, paralysis, ataxia, numbness or tingling in the extremities. No change in bowel or bladder control.  MUSCULOSKELETAL: chronic leg pain LYMPHATICS: No enlarged nodes. No history of splenectomy.  PSYCHIATRIC: No history of depression or anxiety.  ENDOCRINOLOGIC: No reports of sweating, cold or heat intolerance. No polyuria or polydipsia.  Marland Kitchen   Physical Examination p 79 bp 152/70 Wt 233 lbs BMI 34 Gen: resting comfortably, no acute distress HEENT: no scleral icterus, pupils equal round and reactive, no palptable cervical adenopathy,  CV: RRR, no m/r/g, no JVD, no carotid bruits Resp: Clear to auscultation bilaterally GI: abdomen is soft, non-tender, non-distended, normal bowel sounds, no hepatosplenomegaly MSK: extremities are warm, no edema.  Skin: warm, no rash Neuro:  no focal deficits Psych: appropriate affect   Diagnostic Studies Cath 03/2012  Hemodynamic Findings:  Central  aortic pressure: 150/100  Left ventricular pressure: 148/17/21  Angiographic Findings:  Left main: No obstructive disease.  Left Anterior Descending Artery: Large vessel that coursed to the apex. The proximal and mid vessel had serial 20% lesions. The distal vessel had 100% occlusion just before the apex. The vessel was 1.75 mm in this location. The diagonal Devora Tortorella was small in caliber and patent with mild diffuse disease.  Circumflex Artery: Large caliber vessel with large first obtuse marginal Cheyeanne Roadcap. There is a 30% stenosis in the proximal portion of the obtuse marginal Noya Santarelli. The  distal segment of the OM Lenville Hibberd is 100% occluded.  Right Coronary Artery: Large, dominant vessel with 40% proximal stenosis, long tubular 40% mid stenosis, serial 25% distal stenoses. The PDA is very small caliber and has diffuse 80% stenosis. The Posterolateral Rande Roylance is small in caliber and has diffuse 50% stenosis.  Left Ventricular Angiogram: Deferred.  Impression:  1. Triple vessel CAD with occlusion of the apical LAD and distal portion of the first obtuse marginal Elinore Shults. No focal lesions for PCI  2. NSTEMI secondary to above.  Recommendations: Will continue medical management with aggressive risk factor reduction. He will need to stop smoking. We will continue statin, beta blocker. Will continue ASA and will load with Plavix. Will check echo to assess LVEF.  Complications: None. The patient tolerated the procedure well.    03/2012 echo  LVEF 40-45%, mild LVH, multiple WMAs, grade II diastolic dysfunction,      Assessment and Plan  1. CAD/ICM  - LVEF 40-45% BY echo 03/2012 - recent cath showed diffuse disease as described above, no PCI targets.  - patient appears euvolemic today, will titrate up his coreg to 25mg  bid. He is not on ACE or spironolactone because of his significant renal dysfunction. At next follow up, pending response to increased coreg likely start hydralazine if patient is able to obtain  from financial standpoint.  - stop plavix   2. HL  - patient reports now that we are stopping his plavix he will be able to afford his statin, to start therapy.   3. HTN  - not at goal, given CKD goal is < 130/80. Follow pressures after increasing coreg.   4. Leg pain  - prior normal ABIs  - reports recent repeat study as part of disability evaluation, will send for records   6. Tobacco  - counseled extensively on tobacco cessation.  - given Rx for nicotine patches      Antoine Poche, M.D., F.A.C.C.

## 2013-05-23 NOTE — Patient Instructions (Addendum)
Your physician recommends that you schedule a follow-up appointment in: 1 month with Dr. Wyline Mood. This appointment will be scheduled before you leave today.  Your physician has recommended you make the following change in your medication:  Stop: Clopidogrel (Plavix) Decrease Aspirin 81 MG once daily Increase Carvedilol 25 MG twice daily Start: Nicotine patch 14 MG patch once daily for 6 weeks, then 7 MG patch once daily for 2 weeks.  Continue all other medications the same.

## 2013-05-30 ENCOUNTER — Telehealth: Payer: Self-pay | Admitting: Vascular Surgery

## 2013-05-30 NOTE — Telephone Encounter (Addendum)
Message copied by Fredrich Birks on Wed May 30, 2013  2:38 PM ------      Message from: Phillips Odor      Created: Wed May 30, 2013  2:10 PM      Regarding: new referral from Dr. Adam Phenix      Contact: [phone]       Dr. Ulice Brilliant called to schedule this pt. For an ASAP appt. For evaluation of "Right Great Toe Ischemia; and absence of toe pressure"  ABI's done/ received/ Dr. Edilia Bo reviewed and recommends pt. To be scheduled within next week. (Dr. Mariane Duval cell # [phone]) He told me the pt. is expecting a call from [place] this afternoon. ------  05/30/13 @ 2:38pm- spoke with patient to schedule for appt with [facility] on 05/31/13 @ 2:30pm. I also informed Dr Ulice Brilliant of the appointment as well. dpm

## 2013-05-31 ENCOUNTER — Encounter: Payer: Self-pay | Admitting: Vascular Surgery

## 2013-05-31 ENCOUNTER — Other Ambulatory Visit: Payer: Self-pay | Admitting: Vascular Surgery

## 2013-05-31 ENCOUNTER — Ambulatory Visit (INDEPENDENT_AMBULATORY_CARE_PROVIDER_SITE_OTHER): Payer: PRIVATE HEALTH INSURANCE | Admitting: Vascular Surgery

## 2013-05-31 VITALS — BP 120/81 | HR 82 | Resp 18 | Ht 69.0 in | Wt 228.0 lb

## 2013-05-31 DIAGNOSIS — I998 Other disorder of circulatory system: Secondary | ICD-10-CM

## 2013-05-31 DIAGNOSIS — I739 Peripheral vascular disease, unspecified: Secondary | ICD-10-CM

## 2013-05-31 DIAGNOSIS — Z01810 Encounter for preprocedural cardiovascular examination: Secondary | ICD-10-CM

## 2013-05-31 DIAGNOSIS — I999 Unspecified disorder of circulatory system: Secondary | ICD-10-CM

## 2013-05-31 DIAGNOSIS — M79609 Pain in unspecified limb: Secondary | ICD-10-CM

## 2013-05-31 MED ORDER — OXYCODONE-ACETAMINOPHEN 7.5-325 MG PO TABS: 1.0000 | ORAL_TABLET | ORAL | Status: DC | PRN

## 2013-05-31 NOTE — Progress Notes (Signed)
[facility] [place] HISTORY AND PHYSICAL   History of Present Illness:  Patient is a 43 y.o. year old male who presents for evaluation of pain in his right first toe and difficulty healing a peronychia. He is referred by Dr. Adam Phenix. The patient states that he has had several ingrown toenails on this foot in the past that healed with treatment. This one has now been present for 4-6 weeks with no improvement. He also describes claudication symptoms with pain and aching in both calves after walking one to 2 blocks. This is been present for several years. He is currently taking hydrocodone with no relief in the right foot. He is currently a half pack per day smoker. Greater than 3 minutes they were spent regarding smoking cessation..  Other medical problems include coronary artery disease with myocardial infarction 2 years ago, renal insufficiency followed by Dr. Fausto Skillern. He also has hypertension hyperlipidemia and diabetes all of which are currently stable.  Past Medical History  Diagnosis Date  . Essential hypertension, benign   . Mixed hyperlipidemia   . Coronary atherosclerosis of native coronary artery     100% apical LAD and distal OM1, Rx medically - 11/13  . Ischemic cardiomyopathy     LVEF 40-45%  . RBBB   . PAD (peripheral artery disease)     Normal ABIs, 2011; focal left EIA stenosis; mild bilateral distal SFA disease, 2008  . CHF (congestive heart failure)   . Anginal pain   . NSTEMI (non-ST elevated myocardial infarction) 02/2012  . Type 2 diabetes mellitus   . History of blood transfusion 1988    "arm went thru glass window" (10/12/2012)  . CKD (chronic kidney disease) stage 3, GFR 30-59 ml/min   . Gouty arthritis   . Depression     Past Surgical History  Procedure Laterality Date  . Arm surgery Right [phone]    "@ least 3 ORs; took nerves out of my left leg and put them in my arm" (10/12/2012)  . Cardiac catheterization  03/28/12    20% prox and  mid LAD, 100% distal LAD, 30% prox OM, 100% distal OM, 40% prox RCA, 40% mid RCA, 25% distal RCA, small PDA w/ 80% diffuse dz, PLB small w/ 50% diffuse stenoses. No focal lesions for PCI. Recommendations for continued medical management and aggressive RF reduction  . Refractive surgery Bilateral ~ 2005  . Eye surgery Left 1990's    "blood vessel ruptured" (10/12/2012)    Social History History  Substance Use Topics  . Smoking status: Current Every Day Smoker -- 0.50 packs/day for 25 years    Types: Cigarettes  . Smokeless tobacco: Never Used  . Alcohol Use: Yes     Comment: 10/12/2012 "quit drinking > 10 yr ago; was an alcoholic"    Family History Family History  Problem Relation Age of Onset  . Heart attack Father 54    Allergies  Allergies  Allergen Reactions  . Vytorin [Ezetimibe-Simvastatin]     Weakness, muscle aches bad.     Current Outpatient Prescriptions  Medication Sig Dispense Refill  . acetaminophen (TYLENOL) 500 MG tablet Take 500 mg by mouth every 6 (six) hours as needed. For pain      . amLODipine (NORVASC) 5 MG tablet Take 5 mg by mouth daily.      Marland Kitchen aspirin 81 MG chewable tablet Chew 81 mg by mouth daily.      . carvedilol (COREG) 25 MG tablet Take 1 tablet (  25 mg total) by mouth 2 (two) times daily.  60 tablet  6  . cholecalciferol (VITAMIN D) 1000 UNITS tablet Take 1,000 Units by mouth daily.      . fish oil-omega-3 fatty acids 1000 MG capsule Take 1 g by mouth daily.      . furosemide (LASIX) 40 MG tablet Take 40 mg by mouth 2 (two) times daily.      Marland Kitchen glipiZIDE (GLUCOTROL) 5 MG tablet Take 1 tablet (5 mg total) by mouth daily.  30 tablet  1  . nicotine ([employer] NICOTINE TRANSDERMAL SYS) 14 mg/24hr patch Place 14 MG patch on skin once daily for 6 weeks.  42 patch  0  . nicotine ([employer] NICOTINE) 7 mg/24hr patch Place 7 MG patch on skin once daily for 2 weeks.  14 patch  0  . nitroGLYCERIN (NITROSTAT) 0.4 MG SL tablet Place 1 tablet (0.4 mg total) under the tongue  every 5 (five) minutes as needed for chest pain.  25 tablet  11  . pravastatin (PRAVACHOL) 40 MG tablet Take 1 tablet (40 mg total) by mouth every evening.  90 tablet  3  . isosorbide mononitrate (IMDUR) 30 MG 24 hr tablet Take 30 mg by mouth daily.      Marland Kitchen oxyCODONE-acetaminophen (PERCOCET) 7.5-325 MG per tablet Take 1 tablet by mouth every 4 (four) hours as needed for pain.  60 tablet  0   No current facility-administered medications for this visit.    ROS:   General:  No weight loss, Fever, chills  HEENT: No recent headaches, no nasal bleeding, no visual changes, no sore throat  Neurologic: No dizziness, blackouts, seizures. No recent symptoms of stroke or mini- stroke. No recent episodes of slurred speech, or temporary blindness.  Cardiac: No recent episodes of chest pain/pressure, no shortness of breath at rest.  No shortness of breath with exertion.  Denies history of atrial fibrillation or irregular heartbeat  Vascular: See above  Pulmonary: No home oxygen, no productive cough, no hemoptysis,  No asthma or wheezing  Musculoskeletal:  [ ]  Arthritis, [ ]  Low back pain,  [ ]  Joint pain  Hematologic:No history of hypercoagulable state.  No history of easy bleeding.  No history of anemia  Gastrointestinal: No hematochezia or melena,  No gastroesophageal reflux, no trouble swallowing  Urinary: [x ] chronic Kidney disease, [ ]  on HD - [ ]  MWF or [ ]  TTHS, [ ]  Burning with urination, [ ]  Frequent urination, [ ]  Difficulty urinating;   Skin: No rashes  Psychological: No history of anxiety,  No history of depression   Physical Examination  Filed Vitals:   05/31/13 1408  BP: 120/81  Pulse: 82  Resp: 18  Height: 5\' 9"  (1.753 m)  Weight: 228 lb (103.42 kg)    Body mass index is 33.65 kg/(m^2).  General:  Alert and oriented, no acute distress HEENT: Normal Neck: No bruit or JVD Pulmonary: Clear to auscultation bilaterally Cardiac: Regular Rate and Rhythm without  murmur Abdomen: Soft, non-tender, non-distended, no mass Skin: No rash, no erythema surrounding right first toe, no obvious drainage Extremity Pulses:  2+ radial left radial pulse absent, 2+ brachial, absent femoral, dorsalis pedis, posterior tibial pulses bilaterally Musculoskeletal: No deformity or edema  Neurologic: Upper and lower extremity motor 5/5 and symmetric  DATA:  I reviewed and arterial noninvasive study from [facility] hospital. This showed an ABI on the right of 0.61 left 0.83 suggestion of iliofemoral disease in the right and multilevel disease in  the left toe pressure on the right was 0   ASSESSMENT:  Severe multilevel peripheral arterial disease right greater than left with nonhealing wound right first toe  PLAN:  The patient has known renal insufficiency. Therefore as a starting point we will obtain an MRA of the abdomen pelvis and extremities. Based on the findings of the MRA we will make a decision on whether or not an arteriogram would be next.  Findings and plan were discussed with the patient. He understands that he needs to quit smoking. He also understands that he is at risk of limb loss. Prescription was given today for Percocet and he was told to discard his hydrocodone. The patient will followup with me next week to go over the findings of his MRA  Fabienne Brunsharles Rindy Kollman, MD [facility] [place] Office: [phone] Pager: [phone]

## 2013-05-31 NOTE — Progress Notes (Addendum)
Pt. c/o chest pain when checked in for today's appt.  Notified per front desk staff of pt's. complaint.  Told the receptionist that he already took a Nitroglycerin tablet.  Assisted to exam room.  Reported hx of heart attack 2 years ago.  Alert. Skin warm/dry.  Pointed to left anterior chest, proximal to sternum, as the area of pain.  Described as "sharp".  Denied any radiation of pain.  Denied shortness of breath.  Denied nausea.  Stated he had chest pain this morning, also, that was relieved by one Nitroglycerin tablet.  Reported he took a Ntg. Tab x1, and ASA 325 mg tabs x2  at 1:25 pm. BP 144/71,P. 88, R.16 @ 1:28 PM.  Rated chest pain at level "3" @ 1:30 PM; rated pain at "0" @ 1:35 PM.   Pt. Stated he thinks the pain was due to increased stress and nerves.  Skin remains w/d and continues to deny chest pain at 1:40 PM.  Informed Dr. Darrick Penna of the above symptoms.   Pt. was assisted to sit in lab sub-waiting area for appt.

## 2013-06-01 ENCOUNTER — Encounter: Payer: Self-pay | Admitting: Podiatry

## 2013-06-06 ENCOUNTER — Other Ambulatory Visit: Payer: PRIVATE HEALTH INSURANCE

## 2013-06-06 ENCOUNTER — Encounter: Payer: Self-pay | Admitting: Cardiology

## 2013-06-06 ENCOUNTER — Encounter: Payer: Self-pay | Admitting: Vascular Surgery

## 2013-06-07 ENCOUNTER — Ambulatory Visit: Payer: PRIVATE HEALTH INSURANCE | Admitting: Vascular Surgery

## 2013-06-19 ENCOUNTER — Telehealth: Payer: Self-pay

## 2013-06-19 DIAGNOSIS — I739 Peripheral vascular disease, unspecified: Secondary | ICD-10-CM

## 2013-06-19 DIAGNOSIS — I70229 Atherosclerosis of native arteries of extremities with rest pain, unspecified extremity: Secondary | ICD-10-CM

## 2013-06-19 MED ORDER — OXYCODONE-ACETAMINOPHEN 7.5-325 MG PO TABS: 1.0000 | ORAL_TABLET | ORAL | Status: DC | PRN

## 2013-06-19 NOTE — Telephone Encounter (Signed)
Discussed pt's symptoms with Dr. Darrick Penna.  Gave verbal order for Oxycodone/ Acetaminophen 7.5mg  /325 mg., take 1 tab, po, q 4 hrs/ prn ; # 30 with no refills.  Advised that pt. will not be able to receive any more refills prior to appt. 2/19.  (will have Dr. Arbie Cookey in office today sign the Rx)

## 2013-06-19 NOTE — Telephone Encounter (Signed)
Phone call from pt. On 2/9.  Reported that he only had 2-3 Oxycodone tabs left.  Stated he was given a prescription for # 90 when he saw Dr. Darrick Penna on 05/31/13, and could only afford to pick up # 30 at the time.  States he has a lot of pain in his right foot, and can't tolerate the pain.  Reports he went to the pharmacy, and was told he needed a new prescription.  Noted the Rx authorized by Dr. Darrick Penna on 1/22 was for # 60 Percocet 7.5/325 mg.  Contacted the phamacist, and was told pt. Couldn't afford the full Rx and was dispensed @ 20 tabs.  Stated that due to a partial prescription being dispensed, will need a new Rx written.  Discussed with Dr. Arbie Cookey in office today.  Advised to discuss with Dr. Darrick Penna.

## 2013-06-20 NOTE — Progress Notes (Signed)
See incoming/phone- walk-in mesg.

## 2013-06-27 ENCOUNTER — Encounter: Payer: Self-pay | Admitting: Vascular Surgery

## 2013-06-28 ENCOUNTER — Encounter: Payer: Self-pay | Admitting: Vascular Surgery

## 2013-06-28 ENCOUNTER — Ambulatory Visit (INDEPENDENT_AMBULATORY_CARE_PROVIDER_SITE_OTHER): Payer: PRIVATE HEALTH INSURANCE | Admitting: Vascular Surgery

## 2013-06-28 VITALS — BP 177/80 | HR 77 | Ht 69.0 in | Wt 221.0 lb

## 2013-06-28 DIAGNOSIS — I998 Other disorder of circulatory system: Secondary | ICD-10-CM

## 2013-06-28 DIAGNOSIS — I70229 Atherosclerosis of native arteries of extremities with rest pain, unspecified extremity: Secondary | ICD-10-CM

## 2013-06-28 DIAGNOSIS — I739 Peripheral vascular disease, unspecified: Secondary | ICD-10-CM

## 2013-06-28 DIAGNOSIS — I999 Unspecified disorder of circulatory system: Secondary | ICD-10-CM

## 2013-06-28 MED ORDER — OXYCODONE-ACETAMINOPHEN 7.5-325 MG PO TABS: 1.0000 | ORAL_TABLET | ORAL | Status: DC | PRN

## 2013-06-28 NOTE — Progress Notes (Signed)
Patient is a 43 year old male returns for followup today. He was last seen in January 22. At that time he was experiencing pain from a peronychia in his right first toe. He has underlying renal insufficiency. He was sent for a noncontrast MRA to evaluate his right lower extremity arterial tree. His right common femoral superficial femoral and popliteal arteries were noted to be patent. However there was some motion degradation. It appeared that his anterior tibial and posterior tibial arteries are most likely occluded but he may have one-vessel runoff via the peroneal artery. He continues to have pain in the right first toe. He is taking Percocet for the pain.  Physical exam:  Filed Vitals:   06/28/13 0904  BP: 177/80  Pulse: 77  Height: 5\' 9"  (1.753 m)  Weight: 221 lb (100.245 kg)  SpO2: 100%    Right lower extremity: No palpable pedal pulses, right first toe slightly erythematous no significant drainage no ulceration  Assessment: Patient with known renal dysfunction most recent serum creatinine 3 followed by Dr. Fausto Skillern and Sidney Ace. To determine whether or not he has adequate arterial supply there is right lower extremity he will need a right lower extremity arteriogram. I discussed with the patient risks benefits possible complications of right lower extremity arteriogram. I emphasized at length that he would be at very high risk for contrast nephropathy potentially requiring dialysis after the procedure. At this point he wishes to think about the procedure further. He will call us if he wishes to schedule the arteriogram.  Patient was given a prescription today for Percocet #20 dispensed. We will not give him further refills of narcotic has maintenance for his right first toe pain. If he wishes we will proceed with arteriography and possible revascularization or amputation of his first toe as definitive treatment rather than long-term narcotic therapy.  Plan: See above  Fabienne Bruns,  MD [facility] [place] Office: [phone] Pager: [phone]

## 2013-06-29 ENCOUNTER — Encounter: Payer: Self-pay | Admitting: Vascular Surgery

## 2013-07-02 ENCOUNTER — Ambulatory Visit (INDEPENDENT_AMBULATORY_CARE_PROVIDER_SITE_OTHER): Payer: PRIVATE HEALTH INSURANCE | Admitting: Cardiology

## 2013-07-02 ENCOUNTER — Encounter: Payer: Self-pay | Admitting: Cardiology

## 2013-07-02 VITALS — BP 156/95 | HR 91 | Ht 69.0 in | Wt 221.0 lb

## 2013-07-02 DIAGNOSIS — E785 Hyperlipidemia, unspecified: Secondary | ICD-10-CM

## 2013-07-02 DIAGNOSIS — I2510 Atherosclerotic heart disease of native coronary artery without angina pectoris: Secondary | ICD-10-CM

## 2013-07-02 DIAGNOSIS — I2589 Other forms of chronic ischemic heart disease: Secondary | ICD-10-CM

## 2013-07-02 DIAGNOSIS — I10 Essential (primary) hypertension: Secondary | ICD-10-CM

## 2013-07-02 DIAGNOSIS — I255 Ischemic cardiomyopathy: Secondary | ICD-10-CM

## 2013-07-02 DIAGNOSIS — R0989 Other specified symptoms and signs involving the circulatory and respiratory systems: Secondary | ICD-10-CM

## 2013-07-02 MED ORDER — LOVASTATIN 20 MG PO TABS: ORAL_TABLET | ORAL | Status: DC

## 2013-07-02 MED ORDER — CARVEDILOL 25 MG PO TABS: 25.0000 mg | ORAL_TABLET | Freq: Two times a day (BID) | ORAL | Status: DC

## 2013-07-02 NOTE — Patient Instructions (Signed)
Your physician recommends that you schedule a follow-up appointment in: 3 months with Dr. Wyline Mood. This appointment will be scheduled today before you leave.  Your physician has recommended you make the following change in your medication:  Stop: Pravastatin  Start: Lovastatin 20 MG take 2 tablets by mouth every night Increase: Carvedilol (Coreg) 25 MG take 1 tablet by mouth twice daily  Continue all other medications the same.   Your physician has requested that you have a carotid duplex. This test is an ultrasound of the carotid arteries in your neck. It looks at blood flow through these arteries that supply the brain with blood. Allow one hour for this exam. There are no restrictions or special instructions.

## 2013-07-02 NOTE — Progress Notes (Signed)
Clinical Summary Mr. Mazziotti is a 43 y.o.male seen today for follow up of the following medical problems.   1. CAD/ICM  -prior cath 100% apical LAD stenosis, medically managed  - LVEF 40-45% by echo 03/2012  - rare chest pain, resolves with NG x1. Exertion limited by lower extremity pain. Sedentary lifestyle. No othopnea, no PND, occas LE edema.  - He recently stopped imdur because of headaches. - last visit intended to increase coreg to 25mg  bid, appears he is still taking 18.75mg  bid.   2. HL  - reports hard time affording medications, last visit changed to pravastatin which is still costing him $40..  - Last panel 02/2013: TC 255 TG 545 HDL 32 LDL not calc  3. HTN  - does not check at home  - compliant with meds, has not taken yet today  4. Leg pain  - followed by vascular Dr Darrick Penna  5. CKD  - followed by Dr Elder Cyphers, considering starting dialysis in the near future.   6. Tobacco  - smoking 3 cigs per day, trying to quit  - could not afford nicotine patches we prescribed last visit     Past Medical History  Diagnosis Date  . Essential hypertension, benign   . Mixed hyperlipidemia   . Coronary atherosclerosis of native coronary artery     100% apical LAD and distal OM1, Rx medically - 11/13  . Ischemic cardiomyopathy     LVEF 40-45%  . RBBB   . PAD (peripheral artery disease)     Normal ABIs, 2011; focal left EIA stenosis; mild bilateral distal SFA disease, 2008  . CHF (congestive heart failure)   . Anginal pain   . NSTEMI (non-ST elevated myocardial infarction) 02/2012  . Type 2 diabetes mellitus   . History of blood transfusion 1988    "arm went thru glass window" (10/12/2012)  . CKD (chronic kidney disease) stage 3, GFR 30-59 ml/min   . Gouty arthritis   . Depression      Allergies  Allergen Reactions  . Vytorin [Ezetimibe-Simvastatin]     Weakness, muscle aches bad.     Current Outpatient Prescriptions  Medication Sig Dispense Refill  .  acetaminophen (TYLENOL) 500 MG tablet Take 500 mg by mouth every 6 (six) hours as needed. For pain      . amLODipine (NORVASC) 5 MG tablet Take 5 mg by mouth daily.      Marland Kitchen aspirin 81 MG chewable tablet Chew 81 mg by mouth daily.      . carvedilol (COREG) 25 MG tablet Take 1 tablet (25 mg total) by mouth 2 (two) times daily.  60 tablet  6  . cholecalciferol (VITAMIN D) 1000 UNITS tablet Take 1,000 Units by mouth daily.      . fish oil-omega-3 fatty acids 1000 MG capsule Take 1 g by mouth daily.      . furosemide (LASIX) 40 MG tablet Take 40 mg by mouth 2 (two) times daily.      Marland Kitchen glipiZIDE (GLUCOTROL) 5 MG tablet Take 1 tablet (5 mg total) by mouth daily.  30 tablet  1  . isosorbide mononitrate (IMDUR) 30 MG 24 hr tablet Take 30 mg by mouth daily.      . nicotine ([employer] NICOTINE TRANSDERMAL SYS) 14 mg/24hr patch Place 14 MG patch on skin once daily for 6 weeks.  42 patch  0  . nicotine ([employer] NICOTINE) 7 mg/24hr patch Place 7 MG patch on skin once daily for 2  weeks.  14 patch  0  . nitroGLYCERIN (NITROSTAT) 0.4 MG SL tablet Place 1 tablet (0.4 mg total) under the tongue every 5 (five) minutes as needed for chest pain.  25 tablet  11  . oxyCODONE-acetaminophen (PERCOCET) 7.5-325 MG per tablet Take 1 tablet by mouth every 4 (four) hours as needed for pain.  30 tablet  0  . oxyCODONE-acetaminophen (PERCOCET) 7.5-325 MG per tablet Take 1 tablet by mouth every 4 (four) hours as needed for pain.  20 tablet  0  . pravastatin (PRAVACHOL) 40 MG tablet Take 1 tablet (40 mg total) by mouth every evening.  90 tablet  3   No current facility-administered medications for this visit.     Past Surgical History  Procedure Laterality Date  . Arm surgery Right [phone]'s    "@ least 3 ORs; took nerves out of my left leg and put them in my arm" (10/12/2012)  . Cardiac catheterization  03/28/12    20% prox and mid LAD, 100% distal LAD, 30% prox OM, 100% distal OM, 40% prox RCA, 40% mid RCA, 25% distal RCA, small PDA  w/ 80% diffuse dz, PLB small w/ 50% diffuse stenoses. No focal lesions for PCI. Recommendations for continued medical management and aggressive RF reduction  . Refractive surgery Bilateral ~ 2005  . Eye surgery Left 1990's    "blood vessel ruptured" (10/12/2012)     Allergies  Allergen Reactions  . Vytorin [Ezetimibe-Simvastatin]     Weakness, muscle aches bad.      Family History  Problem Relation Age of Onset  . Heart attack Father 7150     Social History Mr. Rana SnareLowe reports that he has been smoking Cigarettes.  He has a 12.5 pack-year smoking history. He has never used smokeless tobacco. Mr. Rana SnareLowe reports that he drinks alcohol.   Review of Systems CONSTITUTIONAL: No weight loss, fever, chills, weakness or fatigue.  HEENT: Eyes: No visual loss, blurred vision, double vision or yellow sclerae.No hearing loss, sneezing, congestion, runny nose or sore throat.  SKIN: No rash or itching.  CARDIOVASCULAR: per HPI RESPIRATORY: No shortness of breath, cough or sputum.  GASTROINTESTINAL: No anorexia, nausea, vomiting or diarrhea. No abdominal pain or blood.  GENITOURINARY: No burning on urination, no polyuria NEUROLOGICAL: No headache, dizziness, syncope, paralysis, ataxia, numbness or tingling in the extremities. No change in bowel or bladder control.  MUSCULOSKELETAL: chronic leg pain LYMPHATICS: No enlarged nodes. No history of splenectomy.  PSYCHIATRIC: No history of depression or anxiety.  ENDOCRINOLOGIC: No reports of sweating, cold or heat intolerance. No polyuria or polydipsia.  Marland Kitchen.   Physical Examination p 91 bp 156/95 Wt 221 lbs BMI 33 Gen: resting comfortably, no acute distress HEENT: no scleral icterus, pupils equal round and reactive, no palptable cervical adenopathy,  CV: RRR, no m/r/g, right carotid bruit Resp: Clear to auscultation bilaterally GI: abdomen is soft, non-tender, non-distended, normal bowel sounds, no hepatosplenomegaly MSK: extremities are warm, no  edema.  Skin: warm, no rash Neuro:  no focal deficits Psych: appropriate affect   Diagnostic Studies Cath 03/2012  Hemodynamic Findings:  Central aortic pressure: 150/100  Left ventricular pressure: 148/17/21  Angiographic Findings:  Left main: No obstructive disease.  Left Anterior Descending Artery: Large vessel that coursed to the apex. The proximal and mid vessel had serial 20% lesions. The distal vessel had 100% occlusion just before the apex. The vessel was 1.75 mm in this location. The diagonal Zidane Renner was small in caliber and patent with mild diffuse disease.  Circumflex Artery: Large caliber vessel with large first obtuse marginal Benelli Winther. There is a 30% stenosis in the proximal portion of the obtuse marginal Thurl Boen. The distal segment of the OM Henrietta Cieslewicz is 100% occluded.  Right Coronary Artery: Large, dominant vessel with 40% proximal stenosis, long tubular 40% mid stenosis, serial 25% distal stenoses. The PDA is very small caliber and has diffuse 80% stenosis. The Posterolateral Lajuana Patchell is small in caliber and has diffuse 50% stenosis.  Left Ventricular Angiogram: Deferred.  Impression:  1. 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 No focal lesions for PCI  2. NSTEMI secondary to above.  Recommendations: Will continue medical management with aggressive risk factor reduction. He will need to stop smoking. We will continue statin, beta blocker. Will continue ASA and will load with Plavix. Will check echo to assess LVEF.  Complications: None. The patient tolerated the procedure well.   03/2012 echo  LVEF 40-45%, mild LVH, multiple WMAs, grade II diastolic dysfunction,     Assessment and Plan  1. CAD/ICM  - LVEF 40-45% BY echo 03/2012  - recent cath showed diffuse disease as described above, no PCI targets.  - patient appears euvolemic today, will titrate up his coreg to 25mg  bid. He is not on ACE or spironolactone  because of his significant renal dysfunction. Imdur causes headaches - likely repeat echo at next visit to reevaluate LVEF, if still low consider hydral/nitrates (low dose nitrates to see if he can tolerate given prior headaches on imdur)  2. HL  - prava remains expensive for him, will change to lovastatin 40mg  daily.   3. HTN  - elevated today in clinic but has not taken med yet - follow pressure after increasing coreg, goal bp < 130/80 given DM and CKD.   4. Leg pain  - follow with vascular  5. Tobacco  - counseled extensively on tobacco cessation.  - given Rx for nicotine patches however could not afford  6. Carotid bruit - no neurological symptoms - check carotid US       Antoine Poche, M.D., F.A.C.C.

## 2013-07-06 NOTE — Progress Notes (Signed)
See Feb. 10, 2015 note

## 2013-07-07 ENCOUNTER — Emergency Department (HOSPITAL_COMMUNITY)
Admission: EM | Admit: 2013-07-07 | Discharge: 2013-07-08 | Disposition: A | Discharge status: Home / Self Care | Payer: PRIVATE HEALTH INSURANCE | Attending: Emergency Medicine | Admitting: Emergency Medicine

## 2013-07-07 ENCOUNTER — Encounter (HOSPITAL_COMMUNITY): Payer: Self-pay | Admitting: Emergency Medicine

## 2013-07-07 DIAGNOSIS — N183 Chronic kidney disease, stage 3 unspecified: Secondary | ICD-10-CM | POA: Insufficient documentation

## 2013-07-07 DIAGNOSIS — Z79899 Other long term (current) drug therapy: Secondary | ICD-10-CM | POA: Insufficient documentation

## 2013-07-07 DIAGNOSIS — K5289 Other specified noninfective gastroenteritis and colitis: Secondary | ICD-10-CM | POA: Insufficient documentation

## 2013-07-07 DIAGNOSIS — Z8659 Personal history of other mental and behavioral disorders: Secondary | ICD-10-CM | POA: Insufficient documentation

## 2013-07-07 DIAGNOSIS — Z7982 Long term (current) use of aspirin: Secondary | ICD-10-CM | POA: Insufficient documentation

## 2013-07-07 DIAGNOSIS — R209 Unspecified disturbances of skin sensation: Secondary | ICD-10-CM | POA: Insufficient documentation

## 2013-07-07 DIAGNOSIS — I252 Old myocardial infarction: Secondary | ICD-10-CM | POA: Insufficient documentation

## 2013-07-07 DIAGNOSIS — I129 Hypertensive chronic kidney disease with stage 1 through stage 4 chronic kidney disease, or unspecified chronic kidney disease: Secondary | ICD-10-CM | POA: Insufficient documentation

## 2013-07-07 DIAGNOSIS — N289 Disorder of kidney and ureter, unspecified: Secondary | ICD-10-CM | POA: Insufficient documentation

## 2013-07-07 DIAGNOSIS — I509 Heart failure, unspecified: Secondary | ICD-10-CM | POA: Insufficient documentation

## 2013-07-07 DIAGNOSIS — M109 Gout, unspecified: Secondary | ICD-10-CM | POA: Insufficient documentation

## 2013-07-07 DIAGNOSIS — F17200 Nicotine dependence, unspecified, uncomplicated: Secondary | ICD-10-CM | POA: Insufficient documentation

## 2013-07-07 DIAGNOSIS — E782 Mixed hyperlipidemia: Secondary | ICD-10-CM | POA: Insufficient documentation

## 2013-07-07 DIAGNOSIS — R011 Cardiac murmur, unspecified: Secondary | ICD-10-CM | POA: Insufficient documentation

## 2013-07-07 DIAGNOSIS — I209 Angina pectoris, unspecified: Secondary | ICD-10-CM | POA: Insufficient documentation

## 2013-07-07 DIAGNOSIS — Z9889 Other specified postprocedural states: Secondary | ICD-10-CM | POA: Insufficient documentation

## 2013-07-07 DIAGNOSIS — K5900 Constipation, unspecified: Secondary | ICD-10-CM | POA: Insufficient documentation

## 2013-07-07 DIAGNOSIS — K529 Noninfective gastroenteritis and colitis, unspecified: Secondary | ICD-10-CM

## 2013-07-07 DIAGNOSIS — E119 Type 2 diabetes mellitus without complications: Secondary | ICD-10-CM | POA: Insufficient documentation

## 2013-07-07 MED ORDER — SODIUM CHLORIDE 0.9 % IV SOLN: 1000.0000 mL | INTRAVENOUS | Status: DC

## 2013-07-07 MED ORDER — SODIUM CHLORIDE 0.9 % IV SOLN: 1000.0000 mL | Freq: Once | INTRAVENOUS | Status: DC

## 2013-07-07 NOTE — ED Notes (Addendum)
Progressive abdominal pain x 2-3 weeks.  Was admitted to [facility] yesterday for "colon infection":, but left AMA today d/t dissatisfaction w/care.  Has con't to have pain, nausea, vomiting x 2 today.  Hx of chronic constipation d/t pain medication he takes.  Also has infection in R great toe.

## 2013-07-07 NOTE — ED Provider Notes (Signed)
CSN: [id number]     Arrival date & time 07/07/13  2307 History  This chart was scribed for Dione Booze, MD by Dorothey Baseman, ED Scribe. This patient was seen in room APA10/APA10 and the patient's care was started at 11:33 PM.    Chief Complaint  Patient presents with  . Abdominal Pain   The history is provided by the patient. No language interpreter was used.   HPI Comments: [name] is a 43 y.o. male with a history of type II DM and stage III chronic kidney disease who presents to the Emergency Department complaining of an intermittent pain to the lower abdomen onset 2-3 weeks ago that he states has been progressively worsening, 7/10 currently. Patient reports associated nausea with two episodes of non-bilious, non-bloody emesis today. He states that the pain is exacerbated after vomiting, but alleviated with rest/laying down in certain positions. Patient reports some associated diaphoresis and chills. He reports some associated constipation for the past few weeks that he states is likely due to the narcotic pain medication (Percocet 7.5-315) that he is currently taking. Patient reports taking laxatives at home without significant relief of his symptoms. He reports some associated intermittent urinary retention and numbness to the bilateral feet. Patient was admitted to [facility] yesterday for similar complaints and received a CT of the abdomen and a colonoscopy that indicated some left-sided colitis. Patient left [facility] today because he states that he was dissatisfied with the care that he received. He states that his symptoms appeared to be improving after he left the hospital, but have since returned. Patient has a history of HTN, hyperlipidemia, coronary atherosclerosis, ischemic cardiomyopathy, RBBB, PAD, CHF, NSTEMI. Patient states that he has recently had his eyes checked. Patient states that he "quit" smoking today.   PCP- Dr. Wyvonnia Lora  Past Medical History  Diagnosis Date   . Essential hypertension, benign   . Mixed hyperlipidemia   . Coronary atherosclerosis of native coronary artery     100% apical LAD and distal OM1, Rx medically - 11/13  . Ischemic cardiomyopathy     LVEF 40-45%  . RBBB   . PAD (peripheral artery disease)     Normal ABIs, 2011; focal left EIA stenosis; mild bilateral distal SFA disease, 2008  . CHF (congestive heart failure)   . Anginal pain   . NSTEMI (non-ST elevated myocardial infarction) 02/2012  . Type 2 diabetes mellitus   . History of blood transfusion 1988    "arm went thru glass window" (10/12/2012)  . CKD (chronic kidney disease) stage 3, GFR 30-59 ml/min   . Gouty arthritis   . Depression    Past Surgical History  Procedure Laterality Date  . Arm surgery Right [phone]    "@ least 3 ORs; took nerves out of my left leg and put them in my arm" (10/12/2012)  . Cardiac catheterization  03/28/12    20% prox and mid LAD, 100% distal LAD, 30% prox OM, 100% distal OM, 40% prox RCA, 40% mid RCA, 25% distal RCA, small PDA w/ 80% diffuse dz, PLB small w/ 50% diffuse stenoses. No focal lesions for PCI. Recommendations for continued medical management and aggressive RF reduction  . Refractive surgery Bilateral ~ 2005  . Eye surgery Left 1990's    "blood vessel ruptured" (10/12/2012)   Family History  Problem Relation Age of Onset  . Heart attack Father 20   History  Substance Use Topics  . Smoking status: Current Every Day Smoker --  0.50 packs/day for 25 years    Types: Cigarettes  . Smokeless tobacco: Never Used  . Alcohol Use: Yes     Comment: 10/12/2012 "quit drinking > 10 yr ago; was an alcoholic"    Review of Systems  Constitutional: Positive for chills and diaphoresis.  Gastrointestinal: Positive for nausea, vomiting, abdominal pain and constipation.  Genitourinary: Positive for decreased urine volume.  Neurological: Positive for numbness.  All other systems reviewed and are negative.   Allergies  Vytorin  Home  Medications   Current Outpatient Rx  Name  Route  Sig  Dispense  Refill  . acetaminophen (TYLENOL) 500 MG tablet   Oral   Take 500 mg by mouth every 6 (six) hours as needed. For pain         . amLODipine (NORVASC) 5 MG tablet   Oral   Take 5 mg by mouth daily.         Marland Kitchen aspirin 81 MG chewable tablet   Oral   Chew 81 mg by mouth daily.         . carvedilol (COREG) 25 MG tablet   Oral   Take 1 tablet (25 mg total) by mouth 2 (two) times daily with a meal.   60 tablet   6     Doses Increase 07-02-13   . cholecalciferol (VITAMIN D) 400 UNITS TABS tablet   Oral   Take 400 Units by mouth daily.         . fish oil-omega-3 fatty acids 1000 MG capsule   Oral   Take 1 g by mouth daily.         . furosemide (LASIX) 40 MG tablet   Oral   Take 40 mg by mouth 2 (two) times daily.         Marland Kitchen glipiZIDE (GLUCOTROL) 5 MG tablet   Oral   Take 1 tablet (5 mg total) by mouth daily.   30 tablet   1   . lovastatin (MEVACOR) 20 MG tablet      Take two tablets by mouth every night.   60 tablet   11   . nicotine ([employer] NICOTINE TRANSDERMAL SYS) 14 mg/24hr patch      Place 14 MG patch on skin once daily for 6 weeks.   42 patch   0   . nicotine ([employer] NICOTINE) 7 mg/24hr patch      Place 7 MG patch on skin once daily for 2 weeks.   14 patch   0   . nitroGLYCERIN (NITROSTAT) 0.4 MG SL tablet   Sublingual   Place 1 tablet (0.4 mg total) under the tongue every 5 (five) minutes as needed for chest pain.   25 tablet   11   . ondansetron (ZOFRAN) 4 MG tablet   Oral   Take 4 mg by mouth every 8 (eight) hours as needed for nausea or vomiting.         Marland Kitchen oxyCODONE-acetaminophen (PERCOCET) 7.5-325 MG per tablet   Oral   Take 1 tablet by mouth every 4 (four) hours as needed for pain.   20 tablet   0    Triage Vitals: BP 146/58  Pulse 85  Temp(Src) 98.3 F (36.8 C) (Oral)  Resp 18  Ht 5\' 9"  (1.753 m)  Wt 225 lb (102.059 kg)  BMI 33.21 kg/m2  SpO2 99%  Physical  Exam  Nursing note and vitals reviewed. Constitutional: He is oriented to person, place, and time. He appears well-developed and well-nourished.  No distress.  HENT:  Head: Normocephalic and atraumatic.  Eyes: Conjunctivae are normal.  Neck: Normal range of motion. Neck supple.  Soft, right carotid bruit.   Cardiovascular: Normal rate and regular rhythm.   Murmur heard. 2/6 holosystolic murmur at the left sternal border.   Pulmonary/Chest: Effort normal and breath sounds normal. No stridor. No respiratory distress.  Abdominal: Soft. Bowel sounds are normal. He exhibits no distension. There is tenderness. There is no rebound and no guarding.  Mild, diffuse tenderness, worse over the lower abdomen.   Musculoskeletal: Normal range of motion.  Neurological: He is alert and oriented to person, place, and time.  Skin: Skin is warm and dry.  Psychiatric: He has a normal mood and affect. His behavior is normal.    ED Course  Procedures (including critical care time)  DIAGNOSTIC STUDIES: Oxygen Saturation is 99% on room air, normal by my interpretation.    COORDINATION OF CARE: 11:47 PM- Will order an ultrasound to help find a viable vein to insert an IV. Will order UA, EKG, and blood labs (troponin, CBC, CMP, lipase, lactic acid). Will order IV fluids to manage symptoms. Discussed treatment plan with patient at bedside and patient verbalized agreement.   11:52 PM- Performed bedside ultrasound. Was not able to insert peripheral IV successfully. Discussed treatment plan with patient at bedside and patient verbalized agreement.    Labs Review Results for orders placed during the hospital encounter of 07/07/13  CBC WITH DIFFERENTIAL      Result Value Ref Range   WBC 13.2 (*) 4.0 - 10.5 K/uL   RBC 3.30 (*) 4.22 - 5.81 MIL/uL   Hemoglobin 10.0 (*) 13.0 - 17.0 g/dL   HCT 16.129.3 (*) 09.639.0 - 04.552.0 %   MCV 88.8  78.0 - 100.0 fL   MCH 30.3  26.0 - 34.0 pg   MCHC 34.1  30.0 - 36.0 g/dL   RDW 40.912.2   81.111.5 - 91.415.5 %   Platelets 296  150 - 400 K/uL   Neutrophils Relative % 80 (*) 43 - 77 %   Neutro Abs 10.6 (*) 1.7 - 7.7 K/uL   Lymphocytes Relative 12  12 - 46 %   Lymphs Abs 1.5  0.7 - 4.0 K/uL   Monocytes Relative 7  3 - 12 %   Monocytes Absolute 1.0  0.1 - 1.0 K/uL   Eosinophils Relative 1  0 - 5 %   Eosinophils Absolute 0.1  0.0 - 0.7 K/uL   Basophils Relative 0  0 - 1 %   Basophils Absolute 0.0  0.0 - 0.1 K/uL  COMPREHENSIVE METABOLIC PANEL      Result Value Ref Range   Sodium 137  137 - 147 mEq/L   Potassium 4.2  3.7 - 5.3 mEq/L   Chloride 98  96 - 112 mEq/L   CO2 28  19 - 32 mEq/L   Glucose, Bld 92  70 - 99 mg/dL   BUN 41 (*) 6 - 23 mg/dL   Creatinine, Ser 7.824.87 (*) 0.50 - 1.35 mg/dL   Calcium 8.7  8.4 - 95.610.5 mg/dL   Total Protein 6.7  6.0 - 8.3 g/dL   Albumin 2.6 (*) 3.5 - 5.2 g/dL   AST 13  0 - 37 U/L   ALT 7  0 - 53 U/L   Alkaline Phosphatase 47  39 - 117 U/L   Total Bilirubin 0.3  0.3 - 1.2 mg/dL   GFR calc non Af Amer 13 (*) >90 mL/min  GFR calc Af Amer 15 (*) >90 mL/min  LIPASE, BLOOD      Result Value Ref Range   Lipase 26  11 - 59 U/L  URINALYSIS, ROUTINE W REFLEX MICROSCOPIC      Result Value Ref Range   Color, Urine YELLOW  YELLOW   APPearance CLEAR  CLEAR   Specific Gravity, Urine 1.020  1.005 - 1.030   pH 7.0  5.0 - 8.0   Glucose, UA NEGATIVE  NEGATIVE mg/dL   Hgb urine dipstick MODERATE (*) NEGATIVE   Bilirubin Urine NEGATIVE  NEGATIVE   Ketones, ur NEGATIVE  NEGATIVE mg/dL   Protein, ur >993 (*) NEGATIVE mg/dL   Urobilinogen, UA 0.2  0.0 - 1.0 mg/dL   Nitrite NEGATIVE  NEGATIVE   Leukocytes, UA NEGATIVE  NEGATIVE  TROPONIN I      Result Value Ref Range   Troponin I <0.30  <0.30 ng/mL  LACTIC ACID, PLASMA      Result Value Ref Range   Lactic Acid, Venous 0.4 (*) 0.5 - 2.2 mmol/L  URINE MICROSCOPIC-ADD ON      Result Value Ref Range   Squamous Epithelial / LPF RARE  RARE   WBC, UA 0-2  <3 WBC/hpf   RBC / HPF 0-2  <3 RBC/hpf   Bacteria, UA  RARE  RARE    EKG Interpretation   Date/Time:  Sunday July 08 2013 00:42:00 EST Ventricular Rate:  84 PR Interval:  132 QRS Duration: 148 QT Interval:  402 QTC Calculation: 475 R Axis:   69 Text Interpretation:  Normal sinus rhythm Right bundle branch block  Abnormal ECG When compared with ECG of 14-Oct-2012 04:24, Nonspecific T  wave abnormality no longer evident in Lateral leads Confirmed by [facility]   MD, Zionah Criswell ([id number]) on 07/08/2013 1:48:26 AM      MDM   Final diagnoses:  Colitis  Renal insufficiency    Abdominal pain which apparently was due 2 colitis. I reviewed his CT scan which was done on admission were in the hospital which showed evidence of thickening of the wall of the left colon as well as extensive atherosclerotic changes. Attempt was made to start an intravenous line with ultrasound guidance so but it was unsuccessful. Laboratory workup was initiated and records were requested from [facility].  Records from [facility] showed that he was being treated for colitis and colonoscopy was really unremarkable. Laboratory testing here showed normal lactic acid level. WBC is elevated at 13.2 but this is down from 18,000 on admission to [facility]. Creatinine is noted to be elevated at 4.87, but this is decreased from 5.2 at [facility]. Patient does not appear toxic at this point and his main issue seems to be pain control. He was given a dose of hydromorphone and a dose of metoclopramide and he got good relief of pain and nausea with these. He had been discharged from [facility] with prescriptions for antibiotics and is advised to continue taking his antibiotics and is given prescriptions for hydromorphone and metoclopramide. He is to followup with his PCP in 2 days or return to the ED if symptoms are worsening.    I personally performed the services described in this documentation, which was scribed in my presence. The recorded information has been  reviewed and is accurate.      Dione Booze, MD 07/08/13 [phone]

## 2013-07-08 LAB — URINE MICROSCOPIC-ADD ON

## 2013-07-08 LAB — CBC WITH DIFFERENTIAL/PLATELET
BASOS PCT: 0 % (ref 0–1)
Basophils Absolute: 0 10*3/uL (ref 0.0–0.1)
EOS ABS: 0.1 10*3/uL (ref 0.0–0.7)
EOS PCT: 1 % (ref 0–5)
HCT: 29.3 % — ABNORMAL LOW (ref 39.0–52.0)
Hemoglobin: 10 g/dL — ABNORMAL LOW (ref 13.0–17.0)
Lymphocytes Relative: 12 % (ref 12–46)
Lymphs Abs: 1.5 10*3/uL (ref 0.7–4.0)
MCH: 30.3 pg (ref 26.0–34.0)
MCHC: 34.1 g/dL (ref 30.0–36.0)
MCV: 88.8 fL (ref 78.0–100.0)
MONOS PCT: 7 % (ref 3–12)
Monocytes Absolute: 1 10*3/uL (ref 0.1–1.0)
NEUTROS PCT: 80 % — AB (ref 43–77)
Neutro Abs: 10.6 10*3/uL — ABNORMAL HIGH (ref 1.7–7.7)
Platelets: 296 10*3/uL (ref 150–400)
RBC: 3.3 MIL/uL — ABNORMAL LOW (ref 4.22–5.81)
RDW: 12.2 % (ref 11.5–15.5)
WBC: 13.2 10*3/uL — ABNORMAL HIGH (ref 4.0–10.5)

## 2013-07-08 LAB — COMPREHENSIVE METABOLIC PANEL
ALBUMIN: 2.6 g/dL — AB (ref 3.5–5.2)
ALT: 7 U/L (ref 0–53)
AST: 13 U/L (ref 0–37)
Alkaline Phosphatase: 47 U/L (ref 39–117)
BUN: 41 mg/dL — AB (ref 6–23)
CALCIUM: 8.7 mg/dL (ref 8.4–10.5)
CO2: 28 mEq/L (ref 19–32)
Chloride: 98 mEq/L (ref 96–112)
Creatinine, Ser: 4.87 mg/dL — ABNORMAL HIGH (ref 0.50–1.35)
GFR calc Af Amer: 15 mL/min — ABNORMAL LOW (ref >90)
GFR calc non Af Amer: 13 mL/min — ABNORMAL LOW (ref >90)
Glucose, Bld: 92 mg/dL (ref 70–99)
POTASSIUM: 4.2 meq/L (ref 3.7–5.3)
Sodium: 137 mEq/L (ref 137–147)
TOTAL PROTEIN: 6.7 g/dL (ref 6.0–8.3)
Total Bilirubin: 0.3 mg/dL (ref 0.3–1.2)

## 2013-07-08 LAB — URINALYSIS, ROUTINE W REFLEX MICROSCOPIC
BILIRUBIN URINE: NEGATIVE
Glucose, UA: NEGATIVE mg/dL
Ketones, ur: NEGATIVE mg/dL
LEUKOCYTES UA: NEGATIVE
NITRITE: NEGATIVE
PH: 7 (ref 5.0–8.0)
Protein, ur: >300 mg/dL — AB
SPECIFIC GRAVITY, URINE: 1.02 (ref 1.005–1.030)
UROBILINOGEN UA: 0.2 mg/dL (ref 0.0–1.0)

## 2013-07-08 LAB — LIPASE, BLOOD: LIPASE: 26 U/L (ref 11–59)

## 2013-07-08 LAB — TROPONIN I

## 2013-07-08 LAB — LACTIC ACID, PLASMA: LACTIC ACID, VENOUS: 0.4 mmol/L — AB (ref 0.5–2.2)

## 2013-07-08 MED ORDER — HYDROMORPHONE HCL 2 MG PO TABS: 2.0000 mg | ORAL_TABLET | ORAL | Status: DC | PRN

## 2013-07-08 MED ORDER — METOCLOPRAMIDE HCL 10 MG PO TABS: 10.0000 mg | ORAL_TABLET | Freq: Four times a day (QID) | ORAL | Status: DC | PRN

## 2013-07-08 MED ORDER — METOCLOPRAMIDE HCL 10 MG PO TABS
10.0000 mg | ORAL_TABLET | Freq: Once | ORAL | Status: AC
  Administered 2013-07-08: 10 mg via ORAL
  Filled 2013-07-08: qty 1

## 2013-07-08 MED ORDER — HYDROMORPHONE HCL 2 MG PO TABS
2.0000 mg | ORAL_TABLET | Freq: Once | ORAL | Status: AC
  Administered 2013-07-08: 2 mg via ORAL
  Filled 2013-07-08: qty 1

## 2013-07-08 NOTE — ED Notes (Signed)
Attempted IV access using [place] w/out success.  Dr. Preston Fleeting made aware.

## 2013-07-08 NOTE — ED Notes (Signed)
Dr. Preston Fleeting unable to obtain IV access w/US.

## 2013-07-08 NOTE — Discharge Instructions (Signed)
Your tests show that your WBC is coming down, and your creatinine has improved slightly. You need to continue taking the antibiotics that were prescribed for you when you left [facility].  Hydromorphone tablets What is this medicine? HYDROMORPHONE (hye droe MOR fone) is a pain reliever. It is used to treat moderate to severe pain. This medicine may be used for other purposes; ask your health care provider or pharmacist if you have questions. COMMON BRAND NAME(S): Dilaudid What should I tell my health care provider before I take this medicine? They need to know if you have any of these conditions: -brain tumor -drug abuse or addiction -head injury -heart disease -frequently drink alcohol containing drinks -kidney disease or problems going to the bathroom -liver disease -lung disease, asthma, or breathing problems -mental problems -an allergic or unusual reaction to lactose, hydromorphone, other opioid analgesics, other medicines, sulfites, foods, dyes, or preservatives -pregnant or trying to get pregnant -breast-feeding How should I use this medicine? Take this medicine by mouth with a glass of water. If the medicine upsets your stomach, take it with food or milk. Follow the directions on the prescription label. Do not take more medicine than you are told to take. Talk to your pediatrician regarding the use of this medicine in children. Special care may be needed. Overdosage: If you think you have taken too much of this medicine contact a poison control center or emergency room at once. NOTE: This medicine is only for you. Do not share this medicine with others. What if I miss a dose? If you miss a dose, take it as soon as you can. If it is almost time for your next dose, take only that dose. Do not take double or extra doses. What may interact with this medicine? -alcohol -antihistamines for allergy, cough and cold -medicines for anesthesia -medicines for depression, anxiety,  or psychotic disturbances -medicines for sleep -muscle relaxants -naltrexone -narcotic medicines (opiates) for pain -phenothiazines like chlorpromazine, mesoridazine, prochlorperazine, thioridazine -tramadol This list may not describe all possible interactions. Give your health care provider a list of all the medicines, herbs, non-prescription drugs, or dietary supplements you use. Also tell them if you smoke, drink alcohol, or use illegal drugs. Some items may interact with your medicine. What should I watch for while using this medicine? Tell your doctor or health care professional if your pain does not go away, if it gets worse, or if you have new or a different type of pain. You may develop tolerance to the medicine. Tolerance means that you will need a higher dose of the medicine for pain relief. Tolerance is normal and is expected if you take this medicine for a long time. Do not suddenly stop taking your medicine because you may develop a severe reaction. Your body becomes used to the medicine. This does NOT mean you are addicted. Addiction is a behavior related to getting and using a drug for a non-medical reason. If you have pain, you have a medical reason to take pain medicine. Your doctor will tell you how much medicine to take. If your doctor wants you to stop the medicine, the dose will be slowly lowered over time to avoid any side effects. You may get drowsy or dizzy. Do not drive, use machinery, or do anything that needs mental alertness until you know how this medicine affects you. Do not stand or sit up quickly, especially if you are an older patient. This reduces the risk of dizzy or fainting spells. Alcohol  may interfere with the effect of this medicine. Avoid alcoholic drinks. There are different types of narcotic medicines (opiates) for pain. If you take more than one type at the same time, you may have more side effects. Give your health care provider a list of all medicines you  use. Your doctor will tell you how much medicine to take. Do not take more medicine than directed. Call emergency for help if you have problems breathing. This medicine will cause constipation. Try to have a bowel movement at least every 2 to 3 days. If you do not have a bowel movement for 3 days, call your doctor or health care professional. Your mouth may get dry. Chewing sugarless gum or sucking hard candy, and drinking plenty of water may help. Contact your doctor if the problem does not go away or is severe. What side effects may I notice from receiving this medicine? Side effects that you should report to your doctor or health care professional as soon as possible: -allergic reactions like skin rash, itching or hives, swelling of the face, lips, or tongue -breathing problems -changes in vision -confusion -feeling faint or lightheaded, falls -seizures -slow or fast heartbeat -trouble passing urine or change in the amount of urine -trouble with balance, talking, walking -unusually weak or tired  Side effects that usually do not require medical attention (report to your doctor or health care professional if they continue or are bothersome): -difficulty sleeping -drowsiness -dry mouth -flushing -headache -itching -loss of appetite -nausea, vomiting This list may not describe all possible side effects. Call your doctor for medical advice about side effects. You may report side effects to FDA at 1-800-FDA-1088. Where should I keep my medicine? Keep out of the reach of children. This medicine can be abused. Keep your medicine in a safe place to protect it from theft. Do not share this medicine with anyone. Selling or giving away this medicine is dangerous and against the law. Store at room temperature between 15 and 30 degrees C (59 and 86 degrees F). Keep container tightly closed. Protect from light. This medicine may cause accidental overdose and death if it is taken by other adults,  children, or pets. Flush any unused medicine down the toilet to reduce the chance of harm. Do not use the medicine after the expiration date. NOTE: This sheet is a summary. It may not cover all possible information. If you have questions about this medicine, talk to your doctor, pharmacist, or health care provider.  2014, Elsevier/Gold Standard. (2012-11-28 09:50:15)  Metoclopramide tablets What is this medicine? METOCLOPRAMIDE (met oh kloe PRA mide) is used to treat the symptoms of gastroesophageal reflux disease (GERD) like heartburn. It is also used to treat people with slow emptying of the stomach and intestinal tract. This medicine may be used for other purposes; ask your health care provider or pharmacist if you have questions. COMMON BRAND NAME(S): Reglan What should I tell my health care provider before I take this medicine? They need to know if you have any of these conditions: -breast cancer -depression -diabetes -heart failure -high blood pressure -kidney disease -liver disease -Parkinson's disease or a movement disorder -pheochromocytoma -seizures -stomach obstruction, bleeding, or perforation -an unusual or allergic reaction to metoclopramide, procainamide, sulfites, other medicines, foods, dyes, or preservatives -pregnant or trying to get pregnant -breast-feeding How should I use this medicine? Take this medicine by mouth with a glass of water. Follow the directions on the prescription label. Take this medicine on an empty stomach,  about 30 minutes before eating. Take your doses at regular intervals. Do not take your medicine more often than directed. Do not stop taking except on the advice of your doctor or health care professional. A special MedGuide will be given to you by the pharmacist with each prescription and refill. Be sure to read this information carefully each time. Talk to your pediatrician regarding the use of this medicine in children. Special care may be  needed. Overdosage: If you think you have taken too much of this medicine contact a poison control center or emergency room at once. NOTE: This medicine is only for you. Do not share this medicine with others. What if I miss a dose? If you miss a dose, take it as soon as you can. If it is almost time for your next dose, take only that dose. Do not take double or extra doses. What may interact with this medicine? -acetaminophen -cyclosporine -digoxin -medicines for blood pressure -medicines for diabetes, including insulin -medicines for hay fever and other allergies -medicines for depression, especially an Monoamine Oxidase Inhibitor (MAOI) -medicines for Parkinson's disease, like levodopa -medicines for sleep or for pain -tetracycline This list may not describe all possible interactions. Give your health care provider a list of all the medicines, herbs, non-prescription drugs, or dietary supplements you use. Also tell them if you smoke, drink alcohol, or use illegal drugs. Some items may interact with your medicine. What should I watch for while using this medicine? It may take a few weeks for your stomach condition to start to get better. However, do not take this medicine for longer than 12 weeks. The longer you take this medicine, and the more you take it, the greater your chances are of developing serious side effects. If you are an elderly patient, a male patient, or you have diabetes, you may be at an increased risk for side effects from this medicine. Contact your doctor immediately if you start having movements you cannot control such as lip smacking, rapid movements of the tongue, involuntary or uncontrollable movements of the eyes, head, arms and legs, or muscle twitches and spasms. Patients and their families should watch out for worsening depression or thoughts of suicide. Also watch out for any sudden or severe changes in feelings such as feeling anxious, agitated, panicky,  irritable, hostile, aggressive, impulsive, severely restless, overly excited and hyperactive, or not being able to sleep. If this happens, especially at the beginning of treatment or after a change in dose, call your doctor. Do not treat yourself for high fever. Ask your doctor or health care professional for advice. You may get drowsy or dizzy. Do not drive, use machinery, or do anything that needs mental alertness until you know how this drug affects you. Do not stand or sit up quickly, especially if you are an older patient. This reduces the risk of dizzy or fainting spells. Alcohol can make you more drowsy and dizzy. Avoid alcoholic drinks. What side effects may I notice from receiving this medicine? Side effects that you should report to your doctor or health care professional as soon as possible: -allergic reactions like skin rash, itching or hives, swelling of the face, lips, or tongue -abnormal production of milk in females -breast enlargement in both males and females -change in the way you walk -difficulty moving, speaking or swallowing -drooling, lip smacking, or rapid movements of the tongue -excessive sweating -fever -involuntary or uncontrollable movements of the eyes, head, arms and legs -irregular heartbeat or palpitations -  muscle twitches and spasms -unusually weak or tired Side effects that usually do not require medical attention (report to your doctor or health care professional if they continue or are bothersome): -change in sex drive or performance -depressed mood -diarrhea -difficulty sleeping -headache -menstrual changes -restless or nervous This list may not describe all possible side effects. Call your doctor for medical advice about side effects. You may report side effects to FDA at 1-800-FDA-1088. Where should I keep my medicine? Keep out of the reach of children. Store at room temperature between 20 and 25 degrees C (68 and 77 degrees F). Protect from light.  Keep container tightly closed. Throw away any unused medicine after the expiration date. NOTE: This sheet is a summary. It may not cover all possible information. If you have questions about this medicine, talk to your doctor, pharmacist, or health care provider.  2014, Elsevier/Gold Standard. (2011-08-24 13:04:38)

## 2013-07-08 NOTE — ED Notes (Signed)
Patient with no complaints at this time. Respirations even and unlabored. Skin warm/dry. Discharge instructions reviewed with patient at this time. Patient given opportunity to voice concerns/ask questions. Patient discharged at this time and left Emergency Department with steady gait.   

## 2013-07-09 ENCOUNTER — Emergency Department (HOSPITAL_COMMUNITY): Payer: PRIVATE HEALTH INSURANCE

## 2013-07-09 ENCOUNTER — Inpatient Hospital Stay (HOSPITAL_COMMUNITY)
Admission: EM | Admit: 2013-07-09 | Discharge: 2013-07-14 | DRG: 300 | Disposition: A | Discharge status: Home / Self Care | Payer: PRIVATE HEALTH INSURANCE | Attending: Internal Medicine | Admitting: Internal Medicine

## 2013-07-09 ENCOUNTER — Telehealth: Payer: Self-pay

## 2013-07-09 ENCOUNTER — Encounter (HOSPITAL_COMMUNITY): Payer: Self-pay | Admitting: Emergency Medicine

## 2013-07-09 DIAGNOSIS — N184 Chronic kidney disease, stage 4 (severe): Secondary | ICD-10-CM

## 2013-07-09 DIAGNOSIS — M109 Gout, unspecified: Secondary | ICD-10-CM | POA: Diagnosis present

## 2013-07-09 DIAGNOSIS — E1129 Type 2 diabetes mellitus with other diabetic kidney complication: Secondary | ICD-10-CM | POA: Diagnosis present

## 2013-07-09 DIAGNOSIS — K529 Noninfective gastroenteritis and colitis, unspecified: Secondary | ICD-10-CM | POA: Diagnosis present

## 2013-07-09 DIAGNOSIS — I120 Hypertensive chronic kidney disease with stage 5 chronic kidney disease or end stage renal disease: Secondary | ICD-10-CM | POA: Diagnosis present

## 2013-07-09 DIAGNOSIS — N189 Chronic kidney disease, unspecified: Secondary | ICD-10-CM | POA: Diagnosis present

## 2013-07-09 DIAGNOSIS — K5289 Other specified noninfective gastroenteritis and colitis: Secondary | ICD-10-CM | POA: Diagnosis present

## 2013-07-09 DIAGNOSIS — I4510 Unspecified right bundle-branch block: Secondary | ICD-10-CM | POA: Diagnosis present

## 2013-07-09 DIAGNOSIS — I2589 Other forms of chronic ischemic heart disease: Secondary | ICD-10-CM | POA: Diagnosis present

## 2013-07-09 DIAGNOSIS — I999 Unspecified disorder of circulatory system: Secondary | ICD-10-CM

## 2013-07-09 DIAGNOSIS — F329 Major depressive disorder, single episode, unspecified: Secondary | ICD-10-CM | POA: Diagnosis present

## 2013-07-09 DIAGNOSIS — E11319 Type 2 diabetes mellitus with unspecified diabetic retinopathy without macular edema: Secondary | ICD-10-CM | POA: Diagnosis present

## 2013-07-09 DIAGNOSIS — N183 Chronic kidney disease, stage 3 unspecified: Secondary | ICD-10-CM

## 2013-07-09 DIAGNOSIS — I214 Non-ST elevation (NSTEMI) myocardial infarction: Secondary | ICD-10-CM

## 2013-07-09 DIAGNOSIS — I255 Ischemic cardiomyopathy: Secondary | ICD-10-CM

## 2013-07-09 DIAGNOSIS — I10 Essential (primary) hypertension: Secondary | ICD-10-CM

## 2013-07-09 DIAGNOSIS — N058 Unspecified nephritic syndrome with other morphologic changes: Secondary | ICD-10-CM | POA: Diagnosis present

## 2013-07-09 DIAGNOSIS — Z79899 Other long term (current) drug therapy: Secondary | ICD-10-CM

## 2013-07-09 DIAGNOSIS — I509 Heart failure, unspecified: Secondary | ICD-10-CM | POA: Diagnosis present

## 2013-07-09 DIAGNOSIS — I739 Peripheral vascular disease, unspecified: Principal | ICD-10-CM | POA: Diagnosis present

## 2013-07-09 DIAGNOSIS — K5900 Constipation, unspecified: Secondary | ICD-10-CM | POA: Diagnosis present

## 2013-07-09 DIAGNOSIS — I252 Old myocardial infarction: Secondary | ICD-10-CM

## 2013-07-09 DIAGNOSIS — Z720 Tobacco use: Secondary | ICD-10-CM

## 2013-07-09 DIAGNOSIS — Z87891 Personal history of nicotine dependence: Secondary | ICD-10-CM

## 2013-07-09 DIAGNOSIS — I998 Other disorder of circulatory system: Secondary | ICD-10-CM | POA: Diagnosis present

## 2013-07-09 DIAGNOSIS — I2510 Atherosclerotic heart disease of native coronary artery without angina pectoris: Secondary | ICD-10-CM

## 2013-07-09 DIAGNOSIS — N179 Acute kidney failure, unspecified: Secondary | ICD-10-CM | POA: Diagnosis present

## 2013-07-09 DIAGNOSIS — F3289 Other specified depressive episodes: Secondary | ICD-10-CM | POA: Diagnosis present

## 2013-07-09 DIAGNOSIS — M79609 Pain in unspecified limb: Secondary | ICD-10-CM

## 2013-07-09 DIAGNOSIS — E785 Hyperlipidemia, unspecified: Secondary | ICD-10-CM

## 2013-07-09 DIAGNOSIS — E1122 Type 2 diabetes mellitus with diabetic chronic kidney disease: Secondary | ICD-10-CM | POA: Diagnosis present

## 2013-07-09 DIAGNOSIS — E782 Mixed hyperlipidemia: Secondary | ICD-10-CM | POA: Diagnosis present

## 2013-07-09 DIAGNOSIS — Z7982 Long term (current) use of aspirin: Secondary | ICD-10-CM

## 2013-07-09 DIAGNOSIS — E1139 Type 2 diabetes mellitus with other diabetic ophthalmic complication: Secondary | ICD-10-CM | POA: Diagnosis present

## 2013-07-09 LAB — URINALYSIS, ROUTINE W REFLEX MICROSCOPIC
Bilirubin Urine: NEGATIVE
GLUCOSE, UA: 250 mg/dL — AB
Ketones, ur: NEGATIVE mg/dL
Nitrite: NEGATIVE
SPECIFIC GRAVITY, URINE: 1.031 — AB (ref 1.005–1.030)
UROBILINOGEN UA: 0.2 mg/dL (ref 0.0–1.0)
pH: 5.5 (ref 5.0–8.0)

## 2013-07-09 LAB — CBC WITH DIFFERENTIAL/PLATELET
BASOS ABS: 0 10*3/uL (ref 0.0–0.1)
BASOS PCT: 0 % (ref 0–1)
EOS PCT: 1 % (ref 0–5)
Eosinophils Absolute: 0.1 10*3/uL (ref 0.0–0.7)
HEMATOCRIT: 30.6 % — AB (ref 39.0–52.0)
HEMOGLOBIN: 10.6 g/dL — AB (ref 13.0–17.0)
Lymphocytes Relative: 19 % (ref 12–46)
Lymphs Abs: 2 10*3/uL (ref 0.7–4.0)
MCH: 30.2 pg (ref 26.0–34.0)
MCHC: 34.6 g/dL (ref 30.0–36.0)
MCV: 87.2 fL (ref 78.0–100.0)
MONO ABS: 0.7 10*3/uL (ref 0.1–1.0)
MONOS PCT: 6 % (ref 3–12)
Neutro Abs: 7.8 10*3/uL — ABNORMAL HIGH (ref 1.7–7.7)
Neutrophils Relative %: 74 % (ref 43–77)
Platelets: 312 10*3/uL (ref 150–400)
RBC: 3.51 MIL/uL — ABNORMAL LOW (ref 4.22–5.81)
RDW: 12.3 % (ref 11.5–15.5)
WBC: 10.6 10*3/uL — ABNORMAL HIGH (ref 4.0–10.5)

## 2013-07-09 LAB — COMPREHENSIVE METABOLIC PANEL
ALBUMIN: 2.5 g/dL — AB (ref 3.5–5.2)
ALT: 8 U/L (ref 0–53)
AST: 16 U/L (ref 0–37)
Alkaline Phosphatase: 58 U/L (ref 39–117)
BILIRUBIN TOTAL: 0.2 mg/dL — AB (ref 0.3–1.2)
BUN: 40 mg/dL — AB (ref 6–23)
CALCIUM: 8.6 mg/dL (ref 8.4–10.5)
CHLORIDE: 100 meq/L (ref 96–112)
CO2: 24 mEq/L (ref 19–32)
Creatinine, Ser: 5.16 mg/dL — ABNORMAL HIGH (ref 0.50–1.35)
GFR calc Af Amer: 14 mL/min — ABNORMAL LOW (ref >90)
GFR, EST NON AFRICAN AMERICAN: 12 mL/min — AB (ref >90)
Glucose, Bld: 201 mg/dL — ABNORMAL HIGH (ref 70–99)
Potassium: 3.7 mEq/L (ref 3.7–5.3)
Sodium: 138 mEq/L (ref 137–147)
Total Protein: 6.5 g/dL (ref 6.0–8.3)

## 2013-07-09 LAB — URINE MICROSCOPIC-ADD ON

## 2013-07-09 LAB — GLUCOSE, CAPILLARY
Glucose-Capillary: 76 mg/dL (ref 70–99)
Glucose-Capillary: 124 mg/dL — ABNORMAL HIGH (ref 70–99)

## 2013-07-09 LAB — MRSA PCR SCREENING: MRSA by PCR: NEGATIVE

## 2013-07-09 MED ORDER — AMLODIPINE BESYLATE 5 MG PO TABS
5.0000 mg | ORAL_TABLET | Freq: Every day | ORAL | Status: DC
  Administered 2013-07-09 – 2013-07-14 (×6): 5 mg via ORAL
  Filled 2013-07-09 (×6): qty 1

## 2013-07-09 MED ORDER — CIPROFLOXACIN HCL 500 MG PO TABS
500.0000 mg | ORAL_TABLET | Freq: Two times a day (BID) | ORAL | Status: DC
  Filled 2013-07-09 (×2): qty 1

## 2013-07-09 MED ORDER — ALUM & MAG HYDROXIDE-SIMETH 200-200-20 MG/5ML PO SUSP
30.0000 mL | Freq: Four times a day (QID) | ORAL | Status: DC | PRN
  Administered 2013-07-14: 30 mL via ORAL
  Filled 2013-07-09 (×2): qty 30

## 2013-07-09 MED ORDER — OMEGA-3 FATTY ACIDS 1000 MG PO CAPS: 1.0000 g | ORAL_CAPSULE | Freq: Every day | ORAL | Status: DC

## 2013-07-09 MED ORDER — ACETAMINOPHEN 325 MG PO TABS: 650.0000 mg | ORAL_TABLET | Freq: Four times a day (QID) | ORAL | Status: DC | PRN

## 2013-07-09 MED ORDER — ASPIRIN 81 MG PO CHEW
81.0000 mg | CHEWABLE_TABLET | Freq: Every day | ORAL | Status: DC
  Administered 2013-07-09 – 2013-07-14 (×6): 81 mg via ORAL
  Filled 2013-07-09 (×5): qty 1

## 2013-07-09 MED ORDER — ONDANSETRON HCL 4 MG PO TABS: 4.0000 mg | ORAL_TABLET | Freq: Four times a day (QID) | ORAL | Status: DC | PRN

## 2013-07-09 MED ORDER — METRONIDAZOLE 500 MG PO TABS
500.0000 mg | ORAL_TABLET | Freq: Three times a day (TID) | ORAL | Status: DC
  Administered 2013-07-09 – 2013-07-12 (×11): 500 mg via ORAL
  Filled 2013-07-09 (×14): qty 1

## 2013-07-09 MED ORDER — HYDROMORPHONE HCL PF 1 MG/ML IJ SOLN
2.0000 mg | INTRAMUSCULAR | Status: DC | PRN
  Administered 2013-07-09 – 2013-07-11 (×10): 2 mg via INTRAVENOUS
  Filled 2013-07-09 (×10): qty 2

## 2013-07-09 MED ORDER — GUAIFENESIN-DM 100-10 MG/5ML PO SYRP: 5.0000 mL | ORAL_SOLUTION | ORAL | Status: DC | PRN

## 2013-07-09 MED ORDER — SODIUM CHLORIDE 0.9 % IV SOLN
INTRAVENOUS | Status: DC
  Administered 2013-07-09: 16:00:00 via INTRAVENOUS
  Administered 2013-07-10: 75 mL/h via INTRAVENOUS
  Administered 2013-07-13 (×2): via INTRAVENOUS

## 2013-07-09 MED ORDER — POLYETHYLENE GLYCOL 3350 17 G PO PACK
17.0000 g | PACK | Freq: Every day | ORAL | Status: DC
  Administered 2013-07-09 – 2013-07-13 (×4): 17 g via ORAL
  Filled 2013-07-09 (×6): qty 1

## 2013-07-09 MED ORDER — ONDANSETRON HCL 4 MG/2ML IJ SOLN
4.0000 mg | Freq: Once | INTRAMUSCULAR | Status: AC
  Administered 2013-07-09: 4 mg via INTRAVENOUS
  Filled 2013-07-09: qty 2

## 2013-07-09 MED ORDER — OXYCODONE HCL 5 MG PO TABS
5.0000 mg | ORAL_TABLET | ORAL | Status: DC | PRN
  Administered 2013-07-09: 5 mg via ORAL
  Administered 2013-07-10 – 2013-07-13 (×7): 10 mg via ORAL
  Filled 2013-07-09: qty 1
  Filled 2013-07-09 (×7): qty 2

## 2013-07-09 MED ORDER — MORPHINE SULFATE 4 MG/ML IJ SOLN
4.0000 mg | Freq: Once | INTRAMUSCULAR | Status: AC
  Administered 2013-07-09: 4 mg via INTRAVENOUS
  Filled 2013-07-09: qty 1

## 2013-07-09 MED ORDER — HYDROMORPHONE HCL PF 1 MG/ML IJ SOLN: 1.0000 mg | INTRAMUSCULAR | Status: DC | PRN

## 2013-07-09 MED ORDER — OXYCODONE HCL 5 MG PO TABS: 510.0000 mg | ORAL_TABLET | ORAL | Status: DC | PRN

## 2013-07-09 MED ORDER — HYDROMORPHONE HCL PF 1 MG/ML IJ SOLN
1.0000 mg | Freq: Once | INTRAMUSCULAR | Status: AC
  Administered 2013-07-09: 1 mg via INTRAVENOUS
  Filled 2013-07-09: qty 1

## 2013-07-09 MED ORDER — ALBUTEROL SULFATE (2.5 MG/3ML) 0.083% IN NEBU: 2.5000 mg | INHALATION_SOLUTION | RESPIRATORY_TRACT | Status: DC | PRN

## 2013-07-09 MED ORDER — CHOLECALCIFEROL 10 MCG (400 UNIT) PO TABS
400.0000 [IU] | ORAL_TABLET | Freq: Every day | ORAL | Status: DC
  Administered 2013-07-09 – 2013-07-14 (×6): 400 [IU] via ORAL
  Filled 2013-07-09 (×6): qty 1

## 2013-07-09 MED ORDER — HEPARIN SODIUM (PORCINE) 5000 UNIT/ML IJ SOLN
5000.0000 [IU] | Freq: Three times a day (TID) | INTRAMUSCULAR | Status: DC
  Administered 2013-07-09 – 2013-07-14 (×14): 5000 [IU] via SUBCUTANEOUS
  Filled 2013-07-09 (×18): qty 1

## 2013-07-09 MED ORDER — NITROGLYCERIN 0.4 MG SL SUBL: 0.4000 mg | SUBLINGUAL_TABLET | SUBLINGUAL | Status: DC | PRN

## 2013-07-09 MED ORDER — ONDANSETRON HCL 4 MG/2ML IJ SOLN: 4.0000 mg | Freq: Three times a day (TID) | INTRAMUSCULAR | Status: DC | PRN

## 2013-07-09 MED ORDER — CARVEDILOL 25 MG PO TABS
25.0000 mg | ORAL_TABLET | Freq: Two times a day (BID) | ORAL | Status: DC
  Administered 2013-07-09 – 2013-07-14 (×10): 25 mg via ORAL
  Filled 2013-07-09 (×12): qty 1

## 2013-07-09 MED ORDER — CIPROFLOXACIN HCL 500 MG PO TABS
500.0000 mg | ORAL_TABLET | Freq: Every day | ORAL | Status: DC
  Administered 2013-07-09 – 2013-07-13 (×5): 500 mg via ORAL
  Filled 2013-07-09 (×6): qty 1

## 2013-07-09 MED ORDER — INSULIN ASPART 100 UNIT/ML ~~LOC~~ SOLN
0.0000 [IU] | Freq: Three times a day (TID) | SUBCUTANEOUS | Status: DC
  Administered 2013-07-10 (×2): 1 [IU] via SUBCUTANEOUS
  Administered 2013-07-11: 18:00:00 via SUBCUTANEOUS
  Administered 2013-07-12: 2 [IU] via SUBCUTANEOUS
  Administered 2013-07-12: 1 [IU] via SUBCUTANEOUS
  Administered 2013-07-12: 2 [IU] via SUBCUTANEOUS
  Administered 2013-07-13: 3 [IU] via SUBCUTANEOUS
  Administered 2013-07-13: 1 [IU] via SUBCUTANEOUS
  Administered 2013-07-13: 3 [IU] via SUBCUTANEOUS
  Administered 2013-07-14: 1 [IU] via SUBCUTANEOUS

## 2013-07-09 MED ORDER — ACETAMINOPHEN 650 MG RE SUPP: 650.0000 mg | Freq: Four times a day (QID) | RECTAL | Status: DC | PRN

## 2013-07-09 MED ORDER — SIMVASTATIN 20 MG PO TABS
20.0000 mg | ORAL_TABLET | Freq: Every day | ORAL | Status: DC
  Administered 2013-07-09 – 2013-07-13 (×5): 20 mg via ORAL
  Filled 2013-07-09 (×6): qty 1

## 2013-07-09 MED ORDER — ONDANSETRON HCL 4 MG/2ML IJ SOLN
4.0000 mg | Freq: Four times a day (QID) | INTRAMUSCULAR | Status: DC | PRN
Start: 2013-07-09 — End: 2013-07-14
  Administered 2013-07-10 – 2013-07-14 (×7): 4 mg via INTRAVENOUS
  Filled 2013-07-09 (×7): qty 2

## 2013-07-09 MED ORDER — OMEGA-3-ACID ETHYL ESTERS 1 G PO CAPS
1.0000 g | ORAL_CAPSULE | Freq: Every day | ORAL | Status: DC
  Administered 2013-07-09 – 2013-07-14 (×6): 1 g via ORAL
  Filled 2013-07-09 (×6): qty 1

## 2013-07-09 NOTE — Telephone Encounter (Signed)
Phone call from pt.  Stated if he couldn't get some help today, he was going to take his own life.  Related he was evaluated recently, in the hospital, for an infection in his colon.  Stated "I need to see somebody today to get the shunt placed for dialysis."  Stated he was supposed to have the shunt placed a couple weeks ago, but had to have test for his colon, and couldn't have the shunt placed.  Stated "I am in so much pain in my big toe, that I can't sleep and I can't think straight."  C/o nausea and vomiting.  Stated "I only have "8-10 % of my kidneys functioning."  Stated " if you can't help me today, then you need to send me to someone that can help me."  Pt. became more upset in discussing symptoms with him, and stated he was "in depair."   After reviewing last office note by Dr. Darrick Penna on 2/19, explained to pt. that the plan was to do an angiogram to further evaluate his PAD; and also that the decision to place a catheter or shunt for HD, would be made by Dr. Kristian Covey.  Pt. continued to emphasize he needed to be taken care of today.  Stated "I need to have my angiogram today,"  Advised that I could schedule the angiogram later in the week, but that if he was in that much pain, and having ongoing nausea and vomiting, he should go to the ER.  Pt's. Irritability decreased at this point, and stated he would go to the ER.

## 2013-07-09 NOTE — H&P (Signed)
[facility]                                                                                    Patient Demographics  [name], is a 43 y.o. male  MRN: [id number]   DOB - 12/27/1970  Admit Date - 07/09/2013  Outpatient Primary MD for the patient is TAPPER,DAVID B, MD   With History of -  Past Medical History  Diagnosis Date  . Essential hypertension, benign   . Mixed hyperlipidemia   . Coronary atherosclerosis of native coronary artery     100% apical LAD and distal OM1, Rx medically - 11/13  . Ischemic cardiomyopathy     LVEF 40-45%  . RBBB   . PAD (peripheral artery disease)     Normal ABIs, 2011; focal left EIA stenosis; mild bilateral distal SFA disease, 2008  . CHF (congestive heart failure)   . Anginal pain   . NSTEMI (non-ST elevated myocardial infarction) 02/2012  . Type 2 diabetes mellitus   . History of blood transfusion 1988    "arm went thru glass window" (10/12/2012)  . CKD (chronic kidney disease) stage 3, GFR 30-59 ml/min   . Gouty arthritis   . Depression       Past Surgical History  Procedure Laterality Date  . Arm surgery Right [phone]    "@ least 3 ORs; took nerves out of my left leg and put them in my arm" (10/12/2012)  . Cardiac catheterization  03/28/12    20% prox and mid LAD, 100% distal LAD, 30% prox OM, 100% distal OM, 40% prox RCA, 40% mid RCA, 25% distal RCA, small PDA w/ 80% diffuse dz, PLB small w/ 50% diffuse stenoses. No focal lesions for PCI. Recommendations for continued medical management and aggressive RF reduction  . Refractive surgery Bilateral ~ 2005  . Eye surgery Left 1990's    "blood vessel ruptured" (10/12/2012)    in for   Chief Complaint  Patient presents with  . Toe Pain     HPI  [name]  is a 43 y.o. AA male with a past medical history of peripheral vascular disease, coronary artery disease and medical management, diabetes, stage IV chronic kidney disease presenting with 'severe' pain in R great toe. Patient  has had bilateral foot pain x 4 months with decreased sensation/ numbness/ tingling and burning sensations. The patient has been seen by vascular surgery in the past and was offered a arteriogram, however patient was very reluctant to proceed with the arteriogram given the fear of end-stage renal disease. Since his pain continued to worsen over the past 2 weeks, he presented to the emergency room today, for further evaluation.Patient was seen today by Vascular and Vein specialists who suspect anterior tibial and posterior tibial arteries are occluded and advised he have an angiogram. Given his numerous medical comorbidities, worsening renal function, the hospitalist service was consulted for admission and further treatment. Please note, Patient was in [place] hospital on 2/28 for colitis and left AMA with Flagyl and Cipro- he is currently on day 2 of a 10 day course   Review of Systems    -Reports Night sweats -Reports decreased  appetite and recent weight loss of 15 pounds. -Reports  Shortness of Breath d/t smoking -(+) Nausea/ Vommitting (2-3x/day) and no bowel movement in 6 days -weakness, tingling, numbness in legs/feet bilaterally as well as moderately in fingers.   A full 10 point Review of Systems was done, except as stated above, all other Review of Systems were negative.   Social History History  Substance Use Topics  . Smoking status: Former Smoker -- 0.50 packs/day for 25 years    Types: Cigarettes  . Smokeless tobacco: Never Used  . Alcohol Use: No     Comment: 10/12/2012 "quit drinking > 10 yr ago; was an alcoholic"     Family History Family History  Problem Relation Age of Onset  . Heart attack Father 92     Prior to Admission medications   Medication Sig Start Date End Date Taking? Authorizing Provider  acetaminophen (TYLENOL) 500 MG tablet Take 500 mg by mouth every 6 (six) hours as needed. For pain   Yes Historical Provider, MD  amLODipine (NORVASC) 5 MG tablet Take  5 mg by mouth daily.   Yes Historical Provider, MD  aspirin 81 MG chewable tablet Chew 81 mg by mouth daily.   Yes Historical Provider, MD  carvedilol (COREG) 12.5 MG tablet Take 25 mg by mouth 2 (two) times daily with a meal.   Yes Historical Provider, MD  cholecalciferol (VITAMIN D) 400 UNITS TABS tablet Take 400 Units by mouth daily.   Yes Historical Provider, MD  ciprofloxacin (CIPRO) 500 MG tablet Take 500 mg by mouth 2 (two) times daily. Pt on the 2 day of a 10 day course meds started 07/07/13   Yes Historical Provider, MD  fish oil-omega-3 fatty acids 1000 MG capsule Take 1 g by mouth daily. 10/23/12  Yes Clide Deutscher Serpe, PA-C  furosemide (LASIX) 40 MG tablet Take 40 mg by mouth 2 (two) times daily. 10/14/12  Yes Scott T Weaver, PA-C  glipiZIDE (GLUCOTROL) 5 MG tablet Take 1 tablet (5 mg total) by mouth daily. 10/14/12  Yes Scott Moishe Spice, PA-C  metroNIDAZOLE (FLAGYL) 500 MG tablet Take 500 mg by mouth 3 (three) times daily. Pt on day 2 of a 10 day course. meds started 07/07/13   Yes Historical Provider, MD  nitroGLYCERIN (NITROSTAT) 0.4 MG SL tablet Place 1 tablet (0.4 mg total) under the tongue every 5 (five) minutes as needed for chest pain. 10/14/12  Yes Scott T Weaver, PA-C  ondansetron (ZOFRAN) 4 MG tablet Take 4 mg by mouth every 8 (eight) hours as needed for nausea or vomiting.   Yes Historical Provider, MD  oxyCODONE-acetaminophen (PERCOCET) 10-325 MG per tablet Take 1 tablet by mouth every 6 (six) hours as needed for pain.   Yes Historical Provider, MD  pravastatin (PRAVACHOL) 40 MG tablet Take 40 mg by mouth at bedtime.  06/15/13  Yes Historical Provider, MD  HYDROmorphone (DILAUDID) 2 MG tablet Take 1 tablet (2 mg total) by mouth every 4 (four) hours as needed for severe pain. 07/08/13   Dione Booze, MD  lovastatin (MEVACOR) 20 MG tablet Take two tablets by mouth every night. 07/02/13   Antoine Poche, MD  metoCLOPramide (REGLAN) 10 MG tablet Take 1 tablet (10 mg total) by mouth every 6 (six)  hours as needed for nausea. 07/08/13   Dione Booze, MD    Allergies  Allergen Reactions  . Vytorin [Ezetimibe-Simvastatin]     Weakness, muscle aches bad.  . Oxycodone-Acetaminophen Nausea And Vomiting  Vomiting      Physical Exam  Vitals  Blood pressure 152/90, pulse 97, temperature 98.5 F (36.9 C), temperature source Oral, resp. rate 18, height 5\' 9"  (1.753 m), weight 102.059 kg (225 lb), SpO2 100.00%.   General:  lying in bed in NAD  Psych:  Normal affect and insight, Not Suicidal or Homicidal, Awake Alert, Oriented X 3. Reports if no one can help him today he will kill himself however later says he has no SI but is just in so much pain  Neuro:   Decreased sensation in feet bilaterally. Radial pulses palpable. Palpable but decreased R pedal pulse.  ENT:   Eyes appear Normal, Conjunctivae clear, moist mucous membranes.  Neck:  Supple Neck, No JVD, No cervical lymphadenopathy appriciated  Respiratory:  Symmetrical Chest wall movement, Good air movement bilaterally  Cardiac:  RRR, No Gallops, Rubs or Murmurs, No Parasternal Heave.  Abdomen:  Positive Bowel Sounds, Abdomen distended and painful to palpation in lower quadrants.  Skin: R big toe shows unhealed ingrown toenail injury.  Hyperpigmentation and necrotic looking for 1st 3 R toes.   Lower extremities-right leg-decreased temperature compared to the contralateral leg. Dorsalis pedis pulse is very faintly palpable.  Data Review  CBC  Recent Labs Lab 07/08/13 0007 07/09/13 1210  WBC 13.2* 10.6*  HGB 10.0* 10.6*  HCT 29.3* 30.6*  PLT 296 312  MCV 88.8 87.2  MCH 30.3 30.2  MCHC 34.1 34.6  RDW 12.2 12.3  LYMPHSABS 1.5 2.0  MONOABS 1.0 0.7  EOSABS 0.1 0.1  BASOSABS 0.0 0.0   ------------------------------------------------------------------------------------------------------------------  Chemistries   Recent Labs Lab 07/08/13 0007 07/09/13 1210  NA 137 138  K 4.2 3.7  CL 98 100  CO2 28 24   GLUCOSE 92 201*  BUN 41* 40*  CREATININE 4.87* 5.16*  CALCIUM 8.7 8.6  AST 13 16  ALT 7 8  ALKPHOS 47 58  BILITOT 0.3 0.2*   ------------------------------------------------------------------------------------------------------------------ estimated creatinine clearance is 21.7 ml/min (by C-G formula based on Cr of 5.16). ------------------------------------------------------------------------------------------------------------------ -------------------------------------------------------------------------------------------------------------------  Cardiac Enzymes  Recent Labs Lab 07/08/13 0007  TROPONINI <0.30   ------------------------------------------------------------------------------------------------------------------ No components found with this basename: POCBNP,    ---------------------------------------------------------------------------------------------------------------  Urinalysis    Component Value Date/Time   COLORURINE AMBER* 07/09/2013 1150   APPEARANCEUR CLOUDY* 07/09/2013 1150   LABSPEC 1.031* 07/09/2013 1150   PHURINE 5.5 07/09/2013 1150   GLUCOSEU 250* 07/09/2013 1150   HGBUR MODERATE* 07/09/2013 1150   BILIRUBINUR NEGATIVE 07/09/2013 1150   KETONESUR NEGATIVE 07/09/2013 1150   PROTEINUR >300* 07/09/2013 1150   UROBILINOGEN 0.2 07/09/2013 1150   NITRITE NEGATIVE 07/09/2013 1150   LEUKOCYTESUR TRACE* 07/09/2013 1150    ----------------------------------------------------------------------------------------------------------------  Imaging results:   Dg Foot Complete Right  07/09/2013   CLINICAL DATA:  pain  EXAM: RIGHT FOOT COMPLETE - 3+ VIEW  COMPARISON:  None.  FINDINGS: There is no evidence of fracture or dislocation. There is hallux valgus with mild osteoarthritis of the first MTP joint. Soft tissues are unremarkable. There is peripheral vascular atherosclerotic disease.  IMPRESSION: No acute osseous injury of the right foot.   Electronically Signed   By: Elige Ko   On: 07/09/2013 12:18    My personal review of EKG: Rhythm NSR  Assessment & Plan  Ischemic Toe - Seen by VVS, planning for angiogram. Known history of peripheral vascular disease. - Will admit, narcotics as needed for pain, gently hydrate.  Acute on CKD stage 4 - Gently hydrate, hold Lasix. - Have  consulted nephrology given the fact that the patient would need a angiogram, and may need some optimization by nephrology prior to the procedure. - Monitor electrolytes closely.  DM type 2 - Stop oral hypoglycemic agents, place on SSI on inpatient.  HTN - Stop Furosemide given worsening renal function, continue with Carvedilol, amlodipine  CKD STAGE IV - High risk for contrast induced nephropathy given underlying stage IV chronic kidney disease, gently hydrate, stop Lasix and other nephrotoxic agents. Nephrology consultation has been placed.  CAD - Currently without chest pain or shortness of breath. Continue with aspirin, Coreg and statins.  Hyperlipidemia  - continue privastatin   N/V - Zofran prn  Constipation -start Miralax  PVD -consult vascular - Continued aspirin, statins and Coreg. This is not an acute ischemic leg, rather than a chronic issue and therefore would hold off on starting anticoagulation.  Colitis - continue Flagyl and Cipro- may need pharmacy to adjust dose of ciprofloxacin  DVT Prophylaxis: Heparin    Family Communication:   Daughter and mother at bedside.    Code Status:  Full   Time spent in minutes : 45    Kocot, Jacqueline PA-C on 07/09/2013 at 3:12 PM  Between 7am to 7pm - Pager - [phone]  After 7pm go to [email or web address] - password [email or web address]  And look for the night coverage person covering me after hours  [facility] Office  [phone]  Attending - Seen and examined the, agree with the above assessment and plan. We'll documentation was reviewed, necessary changes have been made. Patient will be admitted, gently  hydrated, Lasix will be stopped. Nephrology and VVS consultation in progress. VVS will schedule a arteriogram in the next few days. Patient is aware of contrast-induced nephropathy and risk for dialysis, he accepts this risk and is willing to proceed with the procedure.  Windell NorfolkS Azrael Maddix MD

## 2013-07-09 NOTE — ED Provider Notes (Signed)
CSN: [id number]     Arrival date & time 07/09/13  1109 History   First MD Initiated Contact with Patient 07/09/13 1118     Chief Complaint  Patient presents with  . Toe Pain     (Consider location/radiation/quality/duration/timing/severity/associated sxs/prior Treatment) The history is provided by the patient and medical records.    This is a 43 year old male who presents emergency Department with chief complaint of severe right toe pain.  Patient has a history of end-stage renal disease, he is not yet on dialysis, he is severe peripheral arterial disease, history of end NSTEMI.  Patient states that he saw Dr. Darrick Penna and vascular surgery on the 20th.  A previous MRA which showed patent arteries in the leg.  Dr. Darrick Penna suggested the patient get an arteriogram however he knew that he would throw him into needing dialysis as he has only 8-10% of his kidney function.  Patient states that he wanted to think about it and callback to reschedule surgery however he states that his pain has become so excruciating that he needs emergent shunt placement an arteriogram today.  Patient states he was sent by Dr. Darrick Penna office for that. Patient states he was also recently diagnosed with colitis and was admitted to [facility] in [place] for that.  Review of the chart state shows a telephone encounter with one of the nurses at Dr. fields office today.  Does state that the patient said if he cannot have anyone help him today he going to kill himself.  Also stated that the patient was asked to set up appointment for outpatient scheduling of these procedures.  Upon questioning the patient denies any suicidality.  He just states that he needs help because his pain is intolerable.  It is described as constant, excruciating, worse with cold, patient denies any fevers.  Past Medical History  Diagnosis Date  . Essential hypertension, benign   . Mixed hyperlipidemia   . Coronary atherosclerosis of native coronary artery      100% apical LAD and distal OM1, Rx medically - 11/13  . Ischemic cardiomyopathy     LVEF 40-45%  . RBBB   . PAD (peripheral artery disease)     Normal ABIs, 2011; focal left EIA stenosis; mild bilateral distal SFA disease, 2008  . CHF (congestive heart failure)   . Anginal pain   . NSTEMI (non-ST elevated myocardial infarction) 02/2012  . Type 2 diabetes mellitus   . History of blood transfusion 1988    "arm went thru glass window" (10/12/2012)  . CKD (chronic kidney disease) stage 3, GFR 30-59 ml/min   . Gouty arthritis   . Depression    Past Surgical History  Procedure Laterality Date  . Arm surgery Right [phone]    "@ least 3 ORs; took nerves out of my left leg and put them in my arm" (10/12/2012)  . Cardiac catheterization  03/28/12    20% prox and mid LAD, 100% distal LAD, 30% prox OM, 100% distal OM, 40% prox RCA, 40% mid RCA, 25% distal RCA, small PDA w/ 80% diffuse dz, PLB small w/ 50% diffuse stenoses. No focal lesions for PCI. Recommendations for continued medical management and aggressive RF reduction  . Refractive surgery Bilateral ~ 2005  . Eye surgery Left 1990's    "blood vessel ruptured" (10/12/2012)   Family History  Problem Relation Age of Onset  . Heart attack Father 17   History  Substance Use Topics  . Smoking status: Former Smoker -- 0.50  packs/day for 25 years    Types: Cigarettes  . Smokeless tobacco: Never Used  . Alcohol Use: No     Comment: 10/12/2012 "quit drinking > 10 yr ago; was an alcoholic"    Review of Systems  Ten systems reviewed and are negative for acute change, except as noted in the HPI.   Allergies  Vytorin  Home Medications   Current Outpatient Rx  Name  Route  Sig  Dispense  Refill  . acetaminophen (TYLENOL) 500 MG tablet   Oral   Take 500 mg by mouth every 6 (six) hours as needed. For pain         . amLODipine (NORVASC) 5 MG tablet   Oral   Take 5 mg by mouth daily.         Marland Kitchen. aspirin 81 MG chewable tablet    Oral   Chew 81 mg by mouth daily.         . carvedilol (COREG) 25 MG tablet   Oral   Take 1 tablet (25 mg total) by mouth 2 (two) times daily with a meal.   60 tablet   6     Doses Increase 07-02-13   . cholecalciferol (VITAMIN D) 400 UNITS TABS tablet   Oral   Take 400 Units by mouth daily.         . fish oil-omega-3 fatty acids 1000 MG capsule   Oral   Take 1 g by mouth daily.         . furosemide (LASIX) 40 MG tablet   Oral   Take 40 mg by mouth 2 (two) times daily.         Marland Kitchen. glipiZIDE (GLUCOTROL) 5 MG tablet   Oral   Take 1 tablet (5 mg total) by mouth daily.   30 tablet   1   . HYDROmorphone (DILAUDID) 2 MG tablet   Oral   Take 1 tablet (2 mg total) by mouth every 4 (four) hours as needed for severe pain.   20 tablet   0   . lovastatin (MEVACOR) 20 MG tablet      Take two tablets by mouth every night.   60 tablet   11   . metoCLOPramide (REGLAN) 10 MG tablet   Oral   Take 1 tablet (10 mg total) by mouth every 6 (six) hours as needed for nausea.   30 tablet   0   . nicotine ([employer] NICOTINE TRANSDERMAL SYS) 14 mg/24hr patch      Place 14 MG patch on skin once daily for 6 weeks.   42 patch   0   . nicotine ([employer] NICOTINE) 7 mg/24hr patch      Place 7 MG patch on skin once daily for 2 weeks.   14 patch   0   . nitroGLYCERIN (NITROSTAT) 0.4 MG SL tablet   Sublingual   Place 1 tablet (0.4 mg total) under the tongue every 5 (five) minutes as needed for chest pain.   25 tablet   11   . ondansetron (ZOFRAN) 4 MG tablet   Oral   Take 4 mg by mouth every 8 (eight) hours as needed for nausea or vomiting.         Marland Kitchen. oxyCODONE-acetaminophen (PERCOCET) 7.5-325 MG per tablet   Oral   Take 1 tablet by mouth every 4 (four) hours as needed for pain.   20 tablet   0    BP 166/94  Pulse 112  Temp(Src) 98.5 F (  36.9 C) (Oral)  Resp 18  Ht 5\' 9"  (1.753 m)  Wt 225 lb (102.059 kg)  BMI 33.21 kg/m2  SpO2 97% Physical Exam Physical Exam  Nursing  note and vitals reviewed. Constitutional: He appears well-developed and well-nourished. No distress.  HENT:  Head: Normocephalic and atraumatic.  Eyes: Conjunctivae normal are normal. No scleral icterus.  Neck: Normal range of motion. Neck supple.  Cardiovascular: Normal rate, regular rhythm and normal heart sounds.   Pulmonary/Chest: Effort normal and breath sounds normal. No respiratory distress.  Abdominal: Soft. There is no tenderness.  Musculoskeletal: He exhibits no edema.  Neurological: He is alert.  Skin: Skin is warm and dry. He is not diaphoretic.  right foot shows warm, erythematous, right great toe.  Refill is less than 3 seconds.  No palpable pulses.  Exquisitely tender to palpation.   Psychiatric: His behavior is normal.    ED Course  Procedures (including critical care time) Labs Review Labs Reviewed  CBC WITH DIFFERENTIAL - Abnormal; Notable for the following:    WBC 10.6 (*)    RBC 3.51 (*)    Hemoglobin 10.6 (*)    HCT 30.6 (*)    Neutro Abs 7.8 (*)    All other components within normal limits  COMPREHENSIVE METABOLIC PANEL  URINALYSIS, ROUTINE W REFLEX MICROSCOPIC   Imaging Review Dg Foot Complete Right  07/09/2013   CLINICAL DATA:  pain  EXAM: RIGHT FOOT COMPLETE - 3+ VIEW  COMPARISON:  None.  FINDINGS: There is no evidence of fracture or dislocation. There is hallux valgus with mild osteoarthritis of the first MTP joint. Soft tissues are unremarkable. There is peripheral vascular atherosclerotic disease.  IMPRESSION: No acute osseous injury of the right foot.   Electronically Signed   By: Elige Ko   On: 07/09/2013 12:18     EKG Interpretation None      MDM   Final diagnoses:  Ischemic toe  PVD (peripheral vascular disease)  Type 2 diabetes mellitus with diabetic chronic kidney disease  CKD (chronic kidney disease)    Patient with chronic, ongoing issues with his renal failure as well as his full arterial disease.  He does not appear to have  acute ischemia.  X-ray of the foot does show peripheral vascular arthrosclerosis in the foot.  Pain control and been initiated.  CBC shows elevated white count with a slight left shift.   Review of chart shows notes of phone conversation with Dr. Evelina Dun nurse. Patient states if her could not get help today he would kill himself. The patient denies true suicidal ideation or plan here to me and states that he just feel shis pain is unbearable. I have spoken with Vascular nurse as well as Dr. Imogene Burn. Patient will be admitted by Medicine, vascular surgery will perform arteriogram, medicine and nephrology to manage assumed acute renal failure and other medical problems.  The patient appears reasonably stabilized for admission considering the current resources, flow, and capabilities available in the ED at this time, and I doubt any other [facility] requiring further screening and/or treatment in the ED prior to admission.   Arthor Captain, PA-C 07/10/13 [phone]

## 2013-07-09 NOTE — Progress Notes (Signed)
ANTIBIOTIC CONSULT NOTE  Pharmacy Consult for Cipro Indication: colitis  Allergies  Allergen Reactions  . Vytorin [Ezetimibe-Simvastatin]     Weakness, muscle aches bad.  . Oxycodone-Acetaminophen Nausea And Vomiting    Vomiting      Patient Measurements: Height: 5\' 9"  (175.3 cm) Weight: 212 lb 8 oz (96.389 kg) IBW/kg (Calculated) : 70.7  Vital Signs: Temp: 97 F (36.1 C) (03/02 1601) Temp src: Oral (03/02 1601) BP: 155/92 mmHg (03/02 1601) Pulse Rate: 97 (03/02 1601)  Labs:  Recent Labs  07/08/13 0007 07/09/13 1210  WBC 13.2* 10.6*  HGB 10.0* 10.6*  PLT 296 312  CREATININE 4.87* 5.16*   Estimated Creatinine Clearance: 21.1 ml/min (by C-G formula based on Cr of 5.16).  Assessment:    Day # 2 of 10 Cipro and Flagyl for colitis.    Cipro 500 mg PO BID was prescribed.  He has not taken any meds today, due to nausea and vomiting.   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  Goal of Therapy:   appropriate Cipro dose for renal function  Plan:   Cipro 500 mg PO q24hrs.  Will begin tonight.  Planning Cipro and Flagyl for 8 more days.  Will follow up renal function, to be sure Cipro dose does not need further adjustment.  Dennie Fetters, [place] Pager: [phone] 07/09/2013,5:28 PM

## 2013-07-09 NOTE — ED Notes (Signed)
Patient with renal failure.  He has known vascular problems.  He has no ciruclation to the right great toe.  Patient states he is having excrutiating pain.  Patient has not started dialysis.  Patient called vein and vascular and was told to come to ED.  He also has infection in his colon recently dx.  He has not had any meds today.

## 2013-07-09 NOTE — Progress Notes (Signed)
Pt admit to unit for right toe infection, pt a/o, c/o pain PRN dilaudid and oxycodone given as ordered, pt c/o having difficulty urinating, bladder scan was 232 cc, pt stated he had to urinate PVR 0 cc, pt stable

## 2013-07-09 NOTE — Consult Note (Signed)
HPI: I was asked by Dr. Tera Helper to see [name] who is a 43 y.o. male with CKD with longstanding diabetes mellitus admitted for limb salvage angiography.  Pt reports he recently had a nondiagnostic MRI done.  He presents for right lower extremity angiogram.  Renal is asked to assist with managment.  He has been followed by nephrology in [place] but is requesting transfer to our "network".  He does not have an av access but it has been discussed. There are no uremic symptoms.  His diabetes complications include retinopathy and neuropathy.  He reports difficulty with voiding a couple of weeks ago and also constipatrion.  For the past month he has been taking PO narcotics for pain control.   Results for WELLS, MABE (MRN [id number]) as of 07/09/2013 16:11  Ref. Range 04/29/2007 19:45 01/29/2008 12:20 09/10/2010 13:46 09/30/2010 19:40 10/01/2010 04:57 10/02/2010 06:30 03/28/2012 06:03 03/29/2012 10:13 10/13/2012 03:50 07/08/2013 00:07 07/09/2013 12:10  Creatinine Latest Range: 0.50-1.35 mg/dL 1.24 1.32 1.50 1.67 (H) 1.52 (H) 1.22 1.93 (H) 1.91 (H) 3.04 (H) 4.87 (H) 5.16 (H)     Past Medical History  Diagnosis Date  . Essential hypertension, benign   . Mixed hyperlipidemia   . Coronary atherosclerosis of native coronary artery     100% apical LAD and distal OM1, Rx medically - 11/13  . Ischemic cardiomyopathy     LVEF 40-45%  . RBBB   . PAD (peripheral artery disease)     Normal ABIs, 2011; focal left EIA stenosis; mild bilateral distal SFA disease, 2008  . CHF (congestive heart failure)   . Anginal pain   . NSTEMI (non-ST elevated myocardial infarction) 02/2012  . Type 2 diabetes mellitus   . History of blood transfusion 1988    "arm went thru glass window" (10/12/2012)  . CKD (chronic kidney disease) stage 3, GFR 30-59 ml/min   . Gouty arthritis   . Depression    Past Surgical History  Procedure Laterality Date  . Arm surgery Right [phone]    "@ least 3 ORs; took nerves out of my left leg  and put them in my arm" (10/12/2012)  . Cardiac catheterization  03/28/12    20% prox and mid LAD, 100% distal LAD, 30% prox OM, 100% distal OM, 40% prox RCA, 40% mid RCA, 25% distal RCA, small PDA w/ 80% diffuse dz, PLB small w/ 50% diffuse stenoses. No focal lesions for PCI. Recommendations for continued medical management and aggressive RF reduction  . Refractive surgery Bilateral ~ 2005  . Eye surgery Left 1990's    "blood vessel ruptured" (10/12/2012)   Social History:  reports that he has quit smoking. His smoking use included Cigarettes. He has a 12.5 pack-year smoking history. He has never used smokeless tobacco. He reports that he uses illicit drugs (Cocaine, "Crack" cocaine, and Marijuana). He reports that he does not drink alcohol. Allergies:  Allergies  Allergen Reactions  . Vytorin [Ezetimibe-Simvastatin]     Weakness, muscle aches bad.  . Oxycodone-Acetaminophen Nausea And Vomiting    Vomiting     Family History  Problem Relation Age of Onset  . Heart attack Father 67    Medications:  Scheduled: . amLODipine  5 mg Oral Daily  . aspirin  81 mg Oral Daily  . carvedilol  25 mg Oral BID WC  . cholecalciferol  400 Units Oral Daily  . ciprofloxacin  500 mg Oral BID  . heparin  5,000 Units Subcutaneous 3 times per day  .  insulin aspart  0-9 Units Subcutaneous TID WC  . metroNIDAZOLE  500 mg Oral TID  . omega-3 acid ethyl esters  1 g Oral Daily  . polyethylene glycol  17 g Oral Daily  . simvastatin  20 mg Oral q1800    ROS: c/o bunion in left foot 'can they take care of it at the same time'  Blood pressure 155/92, pulse 97, temperature 97 F (36.1 C), temperature source Oral, resp. rate 20, height 5' 9"  (1.753 m), weight 96.389 kg (212 lb 8 oz), SpO2 99.00%.  General appearance: alert and cooperative Head: Normocephalic, without obvious abnormality, atraumatic Eyes: negative Ears: normal TM's and external ear canals both ears Nose: Nares normal. Septum midline. Mucosa  normal. No drainage or sinus tenderness. Throat: lips, mucosa, and tongue normal; missing teeth  Chest wall: no tenderness GI: soft, non-tender; bowel sounds normal; no masses,  no organomegaly Extremities: right 1st and 2nd toes with erythema and hallux valgus defor, decreased hair growth Skin: Skin color, texture, turgor normal. No rashes or lesions Neurologic: Grossly normal Results for orders placed during the hospital encounter of 07/09/13 (from the past 48 hour(s))  URINALYSIS, ROUTINE W REFLEX MICROSCOPIC     Status: Abnormal   Collection Time    07/09/13 11:50 AM      Result Value Ref Range   Color, Urine AMBER (*) YELLOW   Comment: BIOCHEMICALS MAY BE AFFECTED BY COLOR   APPearance CLOUDY (*) CLEAR   Specific Gravity, Urine 1.031 (*) 1.005 - 1.030   pH 5.5  5.0 - 8.0   Glucose, UA 250 (*) NEGATIVE mg/dL   Hgb urine dipstick MODERATE (*) NEGATIVE   Bilirubin Urine NEGATIVE  NEGATIVE   Ketones, ur NEGATIVE  NEGATIVE mg/dL   Protein, ur >300 (*) NEGATIVE mg/dL   Urobilinogen, UA 0.2  0.0 - 1.0 mg/dL   Nitrite NEGATIVE  NEGATIVE   Leukocytes, UA TRACE (*) NEGATIVE  URINE MICROSCOPIC-ADD ON     Status: Abnormal   Collection Time    07/09/13 11:50 AM      Result Value Ref Range   WBC, UA 3-6  <3 WBC/hpf   RBC / HPF 0-2  <3 RBC/hpf   Bacteria, UA FEW (*) RARE   Comment: FEW   Casts GRANULAR CAST (*) NEGATIVE  CBC WITH DIFFERENTIAL     Status: Abnormal   Collection Time    07/09/13 12:10 PM      Result Value Ref Range   WBC 10.6 (*) 4.0 - 10.5 K/uL   RBC 3.51 (*) 4.22 - 5.81 MIL/uL   Hemoglobin 10.6 (*) 13.0 - 17.0 g/dL   HCT 30.6 (*) 39.0 - 52.0 %   MCV 87.2  78.0 - 100.0 fL   MCH 30.2  26.0 - 34.0 pg   MCHC 34.6  30.0 - 36.0 g/dL   RDW 12.3  11.5 - 15.5 %   Platelets 312  150 - 400 K/uL   Neutrophils Relative % 74  43 - 77 %   Neutro Abs 7.8 (*) 1.7 - 7.7 K/uL   Lymphocytes Relative 19  12 - 46 %   Lymphs Abs 2.0  0.7 - 4.0 K/uL   Monocytes Relative 6  3 - 12 %    Monocytes Absolute 0.7  0.1 - 1.0 K/uL   Eosinophils Relative 1  0 - 5 %   Eosinophils Absolute 0.1  0.0 - 0.7 K/uL   Basophils Relative 0  0 - 1 %   Basophils Absolute  0.0  0.0 - 0.1 K/uL  COMPREHENSIVE METABOLIC PANEL     Status: Abnormal   Collection Time    07/09/13 12:10 PM      Result Value Ref Range   Sodium 138  137 - 147 mEq/L   Potassium 3.7  3.7 - 5.3 mEq/L   Chloride 100  96 - 112 mEq/L   CO2 24  19 - 32 mEq/L   Glucose, Bld 201 (*) 70 - 99 mg/dL   BUN 40 (*) 6 - 23 mg/dL   Creatinine, Ser 5.16 (*) 0.50 - 1.35 mg/dL   Calcium 8.6  8.4 - 10.5 mg/dL   Total Protein 6.5  6.0 - 8.3 g/dL   Albumin 2.5 (*) 3.5 - 5.2 g/dL   AST 16  0 - 37 U/L   ALT 8  0 - 53 U/L   Alkaline Phosphatase 58  39 - 117 U/L   Total Bilirubin 0.2 (*) 0.3 - 1.2 mg/dL   GFR calc non Af Amer 12 (*) >90 mL/min   GFR calc Af Amer 14 (*) >90 mL/min   Comment: (NOTE)     The eGFR has been calculated using the CKD EPI equation.     This calculation has not been validated in all clinical situations.     eGFR's persistently <90 mL/min signify possible Chronic Kidney     Disease.   Dg Foot Complete Right  07/09/2013   CLINICAL DATA:  pain  EXAM: RIGHT FOOT COMPLETE - 3+ VIEW  COMPARISON:  None.  FINDINGS: There is no evidence of fracture or dislocation. There is hallux valgus with mild osteoarthritis of the first MTP joint. Soft tissues are unremarkable. There is peripheral vascular atherosclerotic disease.  IMPRESSION: No acute osseous injury of the right foot.   Electronically Signed   By: Kathreen Devoid   On: 07/09/2013 12:18   Assessment:  1 Stage 5 CKD 2 Diabetes with complications 3 Peripheral Vascular disease Plan: 1 There is very little that can be done to avoid the need for dialysis other than the usual, in this circumstance:  A) Hydrate  B) Limit contrast dye load  C) Use CO2 to reduce the dye burden 2  If kidney fct worsens will do dialysis if appropriate 3 He will need an AV access before  he leaves the hospital 4 Bladder scan and if  Large residual then place catheter  Keene Gilkey C 07/09/2013, 4:12 PM

## 2013-07-09 NOTE — Progress Notes (Addendum)
[facility]   Subjective  - [name] has come to the hospital in "sever pain" in his right foot.  He is a 43 Y/O male That has been followed in our office.  He was last seen by Dr. Darrick PennaFields on 06/28/2013.  At that time he was experiencing pain from a peronychia in his right first toe. He has underlying renal insufficiency. He was sent for a noncontrast MRA to evaluate his right lower extremity arterial tree. His right common femoral superficial femoral and popliteal arteries were noted to be patent. However there was some motion degradation. It appeared that his anterior tibial and posterior tibial arteries are most likely occluded but he may have one-vessel runoff via the peroneal artery. He continues to have pain in the right first toe. He is taking Percocet for the pain.  Lower extremity angiogram was discussed and he did not want to proceed.  He is now ready to have the angiogram.     Objective 152/90 97 98.5 F (36.9 C) (Oral) 18 100% No intake or output data in the 24 hours ending 07/09/13 1425  Right lower extremity: No palpable pedal pulses, right first toe slightly erythematous no significant drainage no ulceration Right toes are cool to touch, the forefoot and leg are warmer to touch.   Assessment/Planning:  Patient with known renal dysfunction most recent serum creatinine 3 followed by Dr. Fausto Salazar and [name]. To determine whether or not he has adequate arterial supply there is right lower extremity he will need a right lower extremity arteriogram. I discussed with the patient risks benefits possible complications of right lower extremity arteriogram. I emphasized at length that he would be at very high risk for contrast nephropathy potentially requiring dialysis after the procedure. At this point he wishes to  procedure. Dr. Darrick PennaFields will schedule the arteriogram.     Ricky GallantCOLLINS, [name] [name] --  Laboratory Lab Results:  Recent  Labs  07/08/13 0007 07/09/13 1210  WBC 13.2* 10.6*  HGB 10.0* 10.6*  HCT 29.3* 30.6*  PLT 296 312   BMET  Recent Labs  07/08/13 0007 07/09/13 1210  NA 137 138  K 4.2 3.7  CL 98 100  CO2 28 24  GLUCOSE 92 201*  BUN 41* 40*  CREATININE 4.87* 5.16*  CALCIUM 8.7 8.6    COAG Lab Results  Component Value Date   INR 0.9 01/02/2007   No results found for this basename: PTT     Addendum  Reportedly no significant change in exam, patient does not have a threatened limb.  Will try to get on schedule for diagnostic angiogram Tues/Wed.  Pt is CKD Stage V so the risk of imminent ESRD are very real. Dr Darrick PennaFields will be by tomorrow. Keep NPO in case he can get squeezed onto schedule.  BMET    Component Value Date/Time   NA 138 07/09/2013 1210   K 3.7 07/09/2013 1210   CL 100 07/09/2013 1210   CO2 24 07/09/2013 1210   GLUCOSE 201* 07/09/2013 1210   BUN 40* 07/09/2013 1210   CREATININE 5.16* 07/09/2013 1210   CALCIUM 8.6 07/09/2013 1210   GFRNONAA 12* 07/09/2013 1210   GFRAA 14* 07/09/2013 1210      Leonides SakeBrian Makaela Cando, MD [facility] [place] Office: [phone] Pager: [phone]  07/09/2013, 4:53 Salazar  Addendum  Doubt can get pt on schedule for Tuesday.  Another more urgent case will take the remaining spot for tomorrow.  Will  aim for Wednesday with Dr. Darrick Penna.  Leonides Sake, MD [facility] [place] Office: [phone] Pager: [phone]  07/09/2013, 5:14 Salazar

## 2013-07-10 DIAGNOSIS — I739 Peripheral vascular disease, unspecified: Principal | ICD-10-CM

## 2013-07-10 DIAGNOSIS — N179 Acute kidney failure, unspecified: Secondary | ICD-10-CM

## 2013-07-10 DIAGNOSIS — N189 Chronic kidney disease, unspecified: Secondary | ICD-10-CM

## 2013-07-10 DIAGNOSIS — E1129 Type 2 diabetes mellitus with other diabetic kidney complication: Secondary | ICD-10-CM

## 2013-07-10 LAB — GLUCOSE, CAPILLARY
GLUCOSE-CAPILLARY: 150 mg/dL — AB (ref 70–99)
GLUCOSE-CAPILLARY: 159 mg/dL — AB (ref 70–99)
Glucose-Capillary: 140 mg/dL — ABNORMAL HIGH (ref 70–99)
Glucose-Capillary: 129 mg/dL — ABNORMAL HIGH (ref 70–99)

## 2013-07-10 LAB — CBC
HCT: 28.7 % — ABNORMAL LOW (ref 39.0–52.0)
HEMOGLOBIN: 9.8 g/dL — AB (ref 13.0–17.0)
MCH: 30.1 pg (ref 26.0–34.0)
MCHC: 34.1 g/dL (ref 30.0–36.0)
MCV: 88 fL (ref 78.0–100.0)
PLATELETS: 314 10*3/uL (ref 150–400)
RBC: 3.26 MIL/uL — AB (ref 4.22–5.81)
RDW: 12.1 % (ref 11.5–15.5)
WBC: 8.8 10*3/uL (ref 4.0–10.5)

## 2013-07-10 LAB — BASIC METABOLIC PANEL
BUN: 40 mg/dL — ABNORMAL HIGH (ref 6–23)
BUN: 44 mg/dL — ABNORMAL HIGH (ref 6–23)
CALCIUM: 8.7 mg/dL (ref 8.4–10.5)
CHLORIDE: 99 meq/L (ref 96–112)
CO2: 21 mEq/L (ref 19–32)
CO2: 21 meq/L (ref 19–32)
CREATININE: 5.08 mg/dL — AB (ref 0.50–1.35)
Calcium: 8.7 mg/dL (ref 8.4–10.5)
Chloride: 100 mEq/L (ref 96–112)
Creatinine, Ser: 5.24 mg/dL — ABNORMAL HIGH (ref 0.50–1.35)
GFR calc Af Amer: 14 mL/min — ABNORMAL LOW (ref >90)
GFR calc Af Amer: 15 mL/min — ABNORMAL LOW (ref >90)
GFR calc non Af Amer: 12 mL/min — ABNORMAL LOW (ref >90)
GFR calc non Af Amer: 13 mL/min — ABNORMAL LOW (ref >90)
Glucose, Bld: 134 mg/dL — ABNORMAL HIGH (ref 70–99)
Glucose, Bld: 145 mg/dL — ABNORMAL HIGH (ref 70–99)
Potassium: 4.3 mEq/L (ref 3.7–5.3)
Potassium: 4.5 mEq/L (ref 3.7–5.3)
SODIUM: 138 meq/L (ref 137–147)
SODIUM: 136 meq/L — AB (ref 137–147)

## 2013-07-10 MED ORDER — SODIUM CHLORIDE 0.45 % IV SOLN
INTRAVENOUS | Status: DC
  Administered 2013-07-10: 19:00:00 via INTRAVENOUS

## 2013-07-10 NOTE — Progress Notes (Addendum)
[facility] PROGRESS NOTE  Donneta RombergShawn L Mabe ZOX:[id number] DOB: 06/05/1970 DOA: 07/09/2013 PCP: Louie BostonAPPER,DAVID B, MD  Assessment/Plan: Ischemic Toe  - Seen by [facility], planning for angiogram on 3/04. Known history of peripheral vascular disease.  - continue PRN analgesics and IVF's  Acute on CKD stage 4-5 -continue gently hydratione, renal service on board.  -patient needs AV fistula access prior to discharge -depending renal function after arteriogram will need HD  -Monitor electrolytes closely.   DM type 2  - continue SSI; CBG's stable  HTN  -Continue with Carvedilol, amlodipine   CAD  -Currently without chest pain or shortness of breath. Continue with aspirin, Coreg and statins.  -no CP and no SOB  Hyperlipidemia  - continue statin  N/V  -continue PRN antiemetics  Constipation  -due to narcotics -continue Miralax   PVD  -vascular surgery consulted  -Continued aspirin, statins and Coreg.  -This is not an acute ischemic leg, rather than a chronic issue and therefore would hold off on starting anticoagulation.   Non-ischemic cardiomyopathy: last EF 40-45% -currently compensated -no SOB -no JVD and no crackles on exam -follow daily weight, close intake and output  Colitis  -continue Flagyl and Cipro -patient reprots abd pain is pretty much resolved -WBC's WNL now.  DVT Prophylaxis: Heparin    Code Status: Full Family Communication: no family at bedside Disposition Plan: to be determine   Consultants:  Renal service  Vascular surgery   Procedures:  Anticipated arteriogram planned for 3/04  Antibiotics:  cipro and flagyl  HPI/Subjective: Afebrile, no CP, no SOB. Complaining of throbbing pain on his right foot.  Objective: Filed Vitals:   07/10/13 1400  BP: 131/81  Pulse: 90  Temp: 97.9 F (36.6 C)  Resp: 20    Intake/Output Summary (Last 24 hours) at 07/10/13 1651 Last data filed at 07/10/13 1020  Gross per 24 hour  Intake   1945 ml   Output    425 ml  Net   1520 ml   Filed Weights   07/09/13 1115 07/09/13 1601 07/10/13 0613  Weight: 102.059 kg (225 lb) 96.389 kg (212 lb 8 oz) 97.2 kg (214 lb 4.6 oz)    Exam:   General:  NAD, afebrile; complaining of severe throbbing pain on his toes  Cardiovascular: S1 and S2, no rubs or gallops; no JVD  Respiratory: CTA bilaterally  Abdomen: soft, NT, ND, positive BS  Extremities: right big toe shows unhealed ingrown toenail injury; Hyperpigmentation and necrotic looking on 1st 3 toes up to mid phalanges on his right foot. Trace to 1+ edema bilaterally. RLE feel cold in comparison to contralateral leg and is exquisitely tender.    Data Reviewed: Basic Metabolic Panel:  Recent Labs Lab 07/08/13 0007 07/09/13 1210 07/10/13 0305  NA 137 138 138  K 4.2 3.7 4.3  CL 98 100 100  CO2 28 24 21   GLUCOSE 92 201* 134*  BUN 41* 40* 40*  CREATININE 4.87* 5.16* 5.24*  CALCIUM 8.7 8.6 8.7   Liver Function Tests:  Recent Labs Lab 07/08/13 0007 07/09/13 1210  AST 13 16  ALT 7 8  ALKPHOS 47 58  BILITOT 0.3 0.2*  PROT 6.7 6.5  ALBUMIN 2.6* 2.5*    Recent Labs Lab 07/08/13 0007  LIPASE 26   CBC:  Recent Labs Lab 07/08/13 0007 07/09/13 1210 07/10/13 0305  WBC 13.2* 10.6* 8.8  NEUTROABS 10.6* 7.8*  --   HGB 10.0* 10.6* 9.8*  HCT 29.3* 30.6* 28.7*  MCV 88.8 87.2  88.0  PLT 296 312 314   Cardiac Enzymes:  Recent Labs Lab 07/08/13 0007  TROPONINI <0.30   BNP (last 3 results)  Recent Labs  10/13/12 0350  PROBNP 445.0*   CBG:  Recent Labs Lab 07/18/13 1614 07/18/13 2123 07/10/13 0619 07/10/13 1144 07/10/13 1608  GLUCAP 76 124* 140* 150* 129*    Recent Results (from the past 240 hour(s))  MRSA PCR SCREENING     Status: None   Collection Time    Jul 18, 2013  6:44 PM      Result Value Ref Range Status   MRSA by PCR NEGATIVE  NEGATIVE Final   Comment:            The GeneXpert MRSA Assay (FDA     approved for NASAL specimens     only), is  one component of a     comprehensive MRSA colonization     surveillance program. It is not     intended to diagnose MRSA     infection nor to guide or     monitor treatment for     MRSA infections.     Studies: Dg Foot Complete Right  07-18-13   CLINICAL DATA:  pain  EXAM: RIGHT FOOT COMPLETE - 3+ VIEW  COMPARISON:  None.  FINDINGS: There is no evidence of fracture or dislocation. There is hallux valgus with mild osteoarthritis of the first MTP joint. Soft tissues are unremarkable. There is peripheral vascular atherosclerotic disease.  IMPRESSION: No acute osseous injury of the right foot.   Electronically Signed   By: Elige Ko   On: 07-18-13 12:18    Scheduled Meds: . amLODipine  5 mg Oral Daily  . aspirin  81 mg Oral Daily  . carvedilol  25 mg Oral BID WC  . cholecalciferol  400 Units Oral Daily  . ciprofloxacin  500 mg Oral Q2000  . heparin  5,000 Units Subcutaneous 3 times per day  . insulin aspart  0-9 Units Subcutaneous TID WC  . metroNIDAZOLE  500 mg Oral TID  . omega-3 acid ethyl esters  1 g Oral Daily  . polyethylene glycol  17 g Oral Daily  . simvastatin  20 mg Oral q1800   Continuous Infusions: . sodium chloride 75 mL/hr (07/10/13 0631)    Principal Problem:   Ischemic toe Active Problems:   Hyperlipidemia   Type 2 diabetes mellitus with diabetic chronic kidney disease   PAD (peripheral artery disease)   Essential hypertension, benign   Ischemic cardiomyopathy   Acute on chronic renal failure   Noninfectious gastroenteritis and colitis   CKD (chronic kidney disease), stage IV   Time spent: >30 minutes   Mahdi Frye  [facility] Pager [phone]. If 7PM-7AM, please contact night-coverage at [email or web address], password [facility] 07/10/2013, 4:51 PM  LOS: 1 day

## 2013-07-10 NOTE — Progress Notes (Addendum)
Pt known to me apparently willing to undergo arteriogram at this point.  Will schedule for Wednesday. Creatinine 5 today with GFR 14.  Please gently give IV hydration today in order to risk contrast nephropathy.  BMET    Component Value Date/Time   NA 138 07/10/2013 0305   K 4.3 07/10/2013 0305   CL 100 07/10/2013 0305   CO2 21 07/10/2013 0305   GLUCOSE 134* 07/10/2013 0305   BUN 40* 07/10/2013 0305   CREATININE 5.24* 07/10/2013 0305   CALCIUM 8.7 07/10/2013 0305   GFRNONAA 12* 07/10/2013 0305   GFRAA 14* 07/10/2013 0305      Fabienne Bruns, MD [facility] [place] Office: [phone] Pager: [phone]

## 2013-07-10 NOTE — Progress Notes (Signed)
Assessment:  1 Stage 5 CKD  2 Diabetes with complications  3 Peripheral Vascular disease 4 Hypertension  Rec:  1 There is very little that can be done to avoid the need for dialysis other than the usual, in this circumstance:  A) Hydrate  B) Limit contrast dye load  C) Use CO2 to reduce the dye burden  2 If kidney fct worsens will do dialysis if appropriate  3 He will need an AV access before he leaves the hospital   Subjective: Interval History: Bladder scan neg  Objective: Vital signs in last 24 hours: Temp:  [97 F (36.1 C)-98.2 F (36.8 C)] 98 F (36.7 C) (03/03 7893) Pulse Rate:  [79-97] 81 (03/03 1015) Resp:  [18-20] 18 (03/03 1015) BP: (144-159)/(64-92) 159/85 mmHg (03/03 1015) SpO2:  [99 %] 99 % (03/03 1015) Weight:  [96.389 kg (212 lb 8 oz)-97.2 kg (214 lb 4.6 oz)] 97.2 kg (214 lb 4.6 oz) (03/03 8101) Weight change:   Intake/Output from previous day: 03/02 0701 - 03/03 0700 In: 1455 [P.O.:480; I.V.:975] Out: 625 [Urine:625] Intake/Output this shift: Total I/O In: 490 [P.O.:240; I.V.:250] Out: -   Resp: clear to auscultation bilaterally Chest wall: no tenderness Cardio: regular rate and rhythm, S1, S2 normal, no murmur, click, rub or gallop  Lab Results:  Recent Labs  07/09/13 1210 07/10/13 0305  WBC 10.6* 8.8  HGB 10.6* 9.8*  HCT 30.6* 28.7*  PLT 312 314   BMET:  Recent Labs  07/09/13 1210 07/10/13 0305  NA 138 138  K 3.7 4.3  CL 100 100  CO2 24 21  GLUCOSE 201* 134*  BUN 40* 40*  CREATININE 5.16* 5.24*  CALCIUM 8.6 8.7   No results found for this basename: PTH,  in the last 72 hours Iron Studies: No results found for this basename: IRON, TIBC, TRANSFERRIN, FERRITIN,  in the last 72 hours Studies/Results: Dg Foot Complete Right  07/09/2013   CLINICAL DATA:  pain  EXAM: RIGHT FOOT COMPLETE - 3+ VIEW  COMPARISON:  None.  FINDINGS: There is no evidence of fracture or dislocation. There is hallux valgus with mild osteoarthritis of the first  MTP joint. Soft tissues are unremarkable. There is peripheral vascular atherosclerotic disease.  IMPRESSION: No acute osseous injury of the right foot.   Electronically Signed   By: Elige Ko   On: 07/09/2013 12:18   Scheduled: . amLODipine  5 mg Oral Daily  . aspirin  81 mg Oral Daily  . carvedilol  25 mg Oral BID WC  . cholecalciferol  400 Units Oral Daily  . ciprofloxacin  500 mg Oral Q2000  . heparin  5,000 Units Subcutaneous 3 times per day  . insulin aspart  0-9 Units Subcutaneous TID WC  . metroNIDAZOLE  500 mg Oral TID  . omega-3 acid ethyl esters  1 g Oral Daily  . polyethylene glycol  17 g Oral Daily  . simvastatin  20 mg Oral q1800      LOS: 1 day   [name] C 07/10/2013,1:43 PM

## 2013-07-10 NOTE — Progress Notes (Signed)
Pt had 9 beats of Vtach, asymptomatic, denies chest pain, not in respiratory distress. [name] was notified. Will continue to monitor pt

## 2013-07-11 ENCOUNTER — Encounter (HOSPITAL_COMMUNITY)
Admission: EM | Disposition: A | Discharge status: Home / Self Care | Payer: Self-pay | Source: Home / Self Care | Attending: Internal Medicine

## 2013-07-11 DIAGNOSIS — I70229 Atherosclerosis of native arteries of extremities with rest pain, unspecified extremity: Secondary | ICD-10-CM

## 2013-07-11 DIAGNOSIS — I2589 Other forms of chronic ischemic heart disease: Secondary | ICD-10-CM

## 2013-07-11 DIAGNOSIS — N185 Chronic kidney disease, stage 5: Secondary | ICD-10-CM

## 2013-07-11 HISTORY — PX: LOWER EXTREMITY ANGIOGRAM: SHX5508

## 2013-07-11 LAB — CREATININE, SERUM
Creatinine, Ser: 4.86 mg/dL — ABNORMAL HIGH (ref 0.50–1.35)
GFR calc non Af Amer: 13 mL/min — ABNORMAL LOW (ref >90)
GFR, EST AFRICAN AMERICAN: 16 mL/min — AB (ref >90)

## 2013-07-11 LAB — BASIC METABOLIC PANEL
BUN: 40 mg/dL — ABNORMAL HIGH (ref 6–23)
CO2: 22 mEq/L (ref 19–32)
Calcium: 8.6 mg/dL (ref 8.4–10.5)
Chloride: 99 mEq/L (ref 96–112)
Creatinine, Ser: 4.91 mg/dL — ABNORMAL HIGH (ref 0.50–1.35)
GFR calc non Af Amer: 13 mL/min — ABNORMAL LOW (ref >90)
GFR, EST AFRICAN AMERICAN: 15 mL/min — AB (ref >90)
Glucose, Bld: 158 mg/dL — ABNORMAL HIGH (ref 70–99)
POTASSIUM: 4.4 meq/L (ref 3.7–5.3)
Sodium: 136 mEq/L — ABNORMAL LOW (ref 137–147)

## 2013-07-11 LAB — CBC
HCT: 30 % — ABNORMAL LOW (ref 39.0–52.0)
HCT: 32.2 % — ABNORMAL LOW (ref 39.0–52.0)
HEMOGLOBIN: 11.5 g/dL — AB (ref 13.0–17.0)
Hemoglobin: 10.3 g/dL — ABNORMAL LOW (ref 13.0–17.0)
MCH: 29.8 pg (ref 26.0–34.0)
MCH: 30.7 pg (ref 26.0–34.0)
MCHC: 34.3 g/dL (ref 30.0–36.0)
MCHC: 35.7 g/dL (ref 30.0–36.0)
MCV: 86.7 fL (ref 78.0–100.0)
MCV: 86.1 fL (ref 78.0–100.0)
PLATELETS: 326 10*3/uL (ref 150–400)
Platelets: 319 10*3/uL (ref 150–400)
RBC: 3.46 MIL/uL — AB (ref 4.22–5.81)
RBC: 3.74 MIL/uL — AB (ref 4.22–5.81)
RDW: 12 % (ref 11.5–15.5)
RDW: 12.1 % (ref 11.5–15.5)
WBC: 9 10*3/uL (ref 4.0–10.5)
WBC: 8 10*3/uL (ref 4.0–10.5)

## 2013-07-11 LAB — GLUCOSE, CAPILLARY
GLUCOSE-CAPILLARY: 152 mg/dL — AB (ref 70–99)
GLUCOSE-CAPILLARY: 127 mg/dL — AB (ref 70–99)
Glucose-Capillary: 146 mg/dL — ABNORMAL HIGH (ref 70–99)
Glucose-Capillary: 169 mg/dL — ABNORMAL HIGH (ref 70–99)
Glucose-Capillary: 198 mg/dL — ABNORMAL HIGH (ref 70–99)

## 2013-07-11 SURGERY — ANGIOGRAM, LOWER EXTREMITY
Anesthesia: LOCAL | Laterality: Right

## 2013-07-11 MED ORDER — SODIUM CHLORIDE 0.9 % IV SOLN
1.0000 mL/kg/h | INTRAVENOUS | Status: AC
  Administered 2013-07-11: 1 mL/kg/h via INTRAVENOUS

## 2013-07-11 MED ORDER — OXYCODONE-ACETAMINOPHEN 5-325 MG PO TABS
2.0000 | ORAL_TABLET | ORAL | Status: DC | PRN
  Administered 2013-07-11 – 2013-07-14 (×11): 2 via ORAL
  Filled 2013-07-11 (×11): qty 2

## 2013-07-11 MED ORDER — HEPARIN (PORCINE) IN NACL 2-0.9 UNIT/ML-% IJ SOLN
INTRAMUSCULAR | Status: AC
  Filled 2013-07-11: qty 1000

## 2013-07-11 MED ORDER — ZOLPIDEM TARTRATE 5 MG PO TABS
5.0000 mg | ORAL_TABLET | Freq: Once | ORAL | Status: AC
  Administered 2013-07-11: 5 mg via ORAL
  Filled 2013-07-11: qty 1

## 2013-07-11 MED ORDER — FENTANYL CITRATE 0.05 MG/ML IJ SOLN
INTRAMUSCULAR | Status: AC
  Filled 2013-07-11: qty 2

## 2013-07-11 MED ORDER — LIDOCAINE HCL (PF) 1 % IJ SOLN
INTRAMUSCULAR | Status: AC
  Filled 2013-07-11: qty 30

## 2013-07-11 MED ORDER — MIDAZOLAM HCL 2 MG/2ML IJ SOLN
INTRAMUSCULAR | Status: AC
  Filled 2013-07-11: qty 2

## 2013-07-11 MED ORDER — HEPARIN SODIUM (PORCINE) 5000 UNIT/ML IJ SOLN: 5000.0000 [IU] | Freq: Three times a day (TID) | INTRAMUSCULAR | Status: DC

## 2013-07-11 NOTE — Progress Notes (Signed)
Assessment:  1 Stage 5 CKD  2 Diabetes with complications  3 Peripheral Vascular disease  4 Hypertension  Rec:  1 There is very little that can be done to avoid the need for dialysis other than the usual, in this circumstance:   A) Hydrate   B) Limit contrast dye load   C) Use CO2 to reduce the dye burden  2 If kidney fct worsens will do dialysis if appropriate  3 He will need an AV access before he leaves the hospital   Subjective: Interval History: None  Objective: Vital signs in last 24 hours: Temp:  [97.7 F (36.5 C)-98 F (36.7 C)] 97.7 F (36.5 C) (03/04 0610) Pulse Rate:  [73-90] 79 (03/04 0610) Resp:  [18-20] 18 (03/03 1958) BP: (131-175)/(71-90) 141/71 mmHg (03/04 1020) SpO2:  [98 %-100 %] 100 % (03/04 0610) Weight:  [98.657 kg (217 lb 8 oz)] 98.657 kg (217 lb 8 oz) (03/04 0610) Weight change: -3.402 kg (-7 lb 8 oz)  Intake/Output from previous day: 03/03 0701 - 03/04 0700 In: 1450 [P.O.:600; I.V.:850] Out: 300 [Urine:300] Intake/Output this shift:    No change in physical exam  Lab Results:  Recent Labs  07/10/13 0305 07/11/13 0359  WBC 8.8 9.0  HGB 9.8* 10.3*  HCT 28.7* 30.0*  PLT 314 326   BMET:  Recent Labs  07/10/13 2308 07/11/13 0735  NA 136* 136*  K 4.5 4.4  CL 99 99  CO2 21 22  GLUCOSE 145* 158*  BUN 44* 40*  CREATININE 5.08* 4.91*  CALCIUM 8.7 8.6   No results found for this basename: PTH,  in the last 72 hours Iron Studies: No results found for this basename: IRON, TIBC, TRANSFERRIN, FERRITIN,  in the last 72 hours Studies/Results: Dg Foot Complete Right  07/09/2013   CLINICAL DATA:  pain  EXAM: RIGHT FOOT COMPLETE - 3+ VIEW  COMPARISON:  None.  FINDINGS: There is no evidence of fracture or dislocation. There is hallux valgus with mild osteoarthritis of the first MTP joint. Soft tissues are unremarkable. There is peripheral vascular atherosclerotic disease.  IMPRESSION: No acute osseous injury of the right foot.   Electronically  Signed   By: Elige Ko   On: 07/09/2013 12:18   Scheduled: . amLODipine  5 mg Oral Daily  . aspirin  81 mg Oral Daily  . carvedilol  25 mg Oral BID WC  . cholecalciferol  400 Units Oral Daily  . ciprofloxacin  500 mg Oral Q2000  . heparin  5,000 Units Subcutaneous 3 times per day  . insulin aspart  0-9 Units Subcutaneous TID WC  . metroNIDAZOLE  500 mg Oral TID  . omega-3 acid ethyl esters  1 g Oral Daily  . polyethylene glycol  17 g Oral Daily  . simvastatin  20 mg Oral q1800     LOS: 2 days   [name] C 07/11/2013,11:18 AM

## 2013-07-11 NOTE — ED Provider Notes (Signed)
Medical screening examination/treatment/procedure(s) were performed by non-physician practitioner and as supervising physician I was immediately available for consultation/collaboration.   EKG Interpretation None      DW with PA and she discussed situation with Vascular Surgery they want admission via hospitalist service.   Shelda Jakes, MD 07/11/13 [phone]

## 2013-07-11 NOTE — Progress Notes (Signed)
[facility] PROGRESS NOTE Assessment/Plan:  Ischemic Toe  - Seen by VVS, planning for angiogram on 3/04. ASA. - risk d/w patient. - continue PRN analgesics and IVF's   Chronic PVD : - vascular surgery consulted  - Continued aspirin, statins. - no anticoagulation.  Non-ischemic cardiomyopathy: - last EF 40-45%  - Currently compensated  - No SOB  -No JVD and no crackles on exam  - follow daily weight, close intake and output.  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  - patient needs AV fistula access prior to discharge  - Might need HD. Diatek cath to be placed. - Monitor electrolytes closely.   DM type 2  - continue SSI; CBG's stable   HTN  - Continue with Carvedilol, amlodipine.  CAD  - Currently without chest pain or shortness of breath. Continue with aspirin, Coreg and statins.  - no CP and no SOB   Hyperlipidemia  - continue statin  N/V  -continue PRN antiemetics  Constipation  -due to narcotics  -continue Miralax   Colitis  -continue Flagyl and Cipro  -patient reprots abd pain is pretty much resolved  -WBC's WNL now.   Code Status: Full  Family Communication: no family at bedside  Disposition Plan: to be determine    Consultants:  VVS  Procedures:  Angiogram 3.4.2015  Antibiotics:  Cipro 3.2.2015  HPI/Subjective: No complains.  Objective: Filed Vitals:   07/10/13 1400 07/10/13 1846 07/10/13 1958 07/11/13 0610  BP: 131/81 160/90 141/84 175/89  Pulse: 90 85 73 79  Temp: 97.9 F (36.6 C)  98 F (36.7 C) 97.7 F (36.5 C)  TempSrc: Oral  Oral Oral  Resp: 20 20 18    Height:      Weight:    98.657 kg (217 lb 8 oz)  SpO2: 98% 98% 98% 100%    Intake/Output Summary (Last 24 hours) at 07/11/13 0834 Last data filed at 07/11/13 0600  Gross per 24 hour  Intake   1450 ml  Output    300 ml  Net   1150 ml   Filed Weights   07/09/13 1601 07/10/13 0613 07/11/13 0610  Weight: 96.389 kg (212  lb 8 oz) 97.2 kg (214 lb 4.6 oz) 98.657 kg (217 lb 8 oz)    Exam:  General: Alert, awake, oriented x3, in no acute distress.  HEENT: No bruits, no goiter.  Heart: Regular rate and rhythm, without murmurs, rubs, gallops.  Lungs: Good air movement, clear. Abdomen: Soft, nontender, nondistended, positive bowel sounds.    Data Reviewed: Basic Metabolic Panel:  Recent Labs Lab 07/08/13 0007 07/09/13 1210 07/10/13 0305 07/10/13 2308  NA 137 138 138 136*  K 4.2 3.7 4.3 4.5  CL 98 100 100 99  CO2 28 24 21 21   GLUCOSE 92 201* 134* 145*  BUN 41* 40* 40* 44*  CREATININE 4.87* 5.16* 5.24* 5.08*  CALCIUM 8.7 8.6 8.7 8.7   Liver Function Tests:  Recent Labs Lab 07/08/13 0007 07/09/13 1210  AST 13 16  ALT 7 8  ALKPHOS 47 58  BILITOT 0.3 0.2*  PROT 6.7 6.5  ALBUMIN 2.6* 2.5*    Recent Labs Lab 07/08/13 0007  LIPASE 26   No results found for this basename: AMMONIA,  in the last 168 hours CBC:  Recent Labs Lab 07/08/13 0007 07/09/13 1210 07/10/13 0305 07/11/13 0359  WBC 13.2* 10.6* 8.8 9.0  NEUTROABS 10.6* 7.8*  --   --   HGB 10.0*  10.6* 9.8* 10.3*  HCT 29.3* 30.6* 28.7* 30.0*  MCV 88.8 87.2 88.0 86.7  PLT 296 312 314 326   Cardiac Enzymes:  Recent Labs Lab 07/08/13 0007  TROPONINI <0.30   BNP (last 3 results)  Recent Labs  10/13/12 0350  PROBNP 445.0*   CBG:  Recent Labs Lab 07/10/13 0619 07/10/13 1144 07/10/13 1608 07/10/13 2041 07/11/13 0618  GLUCAP 140* 150* 129* 159* 152*    Recent Results (from the past 240 hour(s))  MRSA PCR SCREENING     Status: None   Collection Time    07-21-13  6:44 PM      Result Value Ref Range Status   MRSA by PCR NEGATIVE  NEGATIVE Final   Comment:            The GeneXpert MRSA Assay (FDA     approved for NASAL specimens     only), is one component of a     comprehensive MRSA colonization     surveillance program. It is not     intended to diagnose MRSA     infection nor to guide or     monitor  treatment for     MRSA infections.     Studies: Dg Foot Complete Right  21-Jul-2013   CLINICAL DATA:  pain  EXAM: RIGHT FOOT COMPLETE - 3+ VIEW  COMPARISON:  None.  FINDINGS: There is no evidence of fracture or dislocation. There is hallux valgus with mild osteoarthritis of the first MTP joint. Soft tissues are unremarkable. There is peripheral vascular atherosclerotic disease.  IMPRESSION: No acute osseous injury of the right foot.   Electronically Signed   By: Elige Ko   On: 2013-07-21 12:18    Scheduled Meds: . amLODipine  5 mg Oral Daily  . aspirin  81 mg Oral Daily  . carvedilol  25 mg Oral BID WC  . cholecalciferol  400 Units Oral Daily  . ciprofloxacin  500 mg Oral Q2000  . heparin  5,000 Units Subcutaneous 3 times per day  . insulin aspart  0-9 Units Subcutaneous TID WC  . metroNIDAZOLE  500 mg Oral TID  . omega-3 acid ethyl esters  1 g Oral Daily  . polyethylene glycol  17 g Oral Daily  . simvastatin  20 mg Oral q1800   Continuous Infusions: . sodium chloride 50 mL/hr at 07/10/13 1900  . sodium chloride 75 mL/hr (07/10/13 0631)     Marinda Elk  [facility] Pager [phone]. If 8PM-8AM, please contact night-coverage at [email or web address], password [facility] 07/11/2013, 8:34 AM  LOS: 2 days

## 2013-07-11 NOTE — H&P (Addendum)
Brief History and Physical  History of Present Illness  [place] Kuang Milanes is a 43 y.o. male who presents with chief complaint: R leg pain.  The patient presents today for R leg angiogram, possible intervention.    Past Medical History  Diagnosis Date  . Essential hypertension, benign   . Mixed hyperlipidemia   . Coronary atherosclerosis of native coronary artery     100% apical LAD and distal OM1, Rx medically - 11/13  . Ischemic cardiomyopathy     LVEF 40-45%  . RBBB   . PAD (peripheral artery disease)     Normal ABIs, 2011; focal left EIA stenosis; mild bilateral distal SFA disease, 2008  . CHF (congestive heart failure)   . Anginal pain   . NSTEMI (non-ST elevated myocardial infarction) 02/2012  . Type 2 diabetes mellitus   . History of blood transfusion 1988    "arm went thru glass window" (10/12/2012)  . CKD (chronic kidney disease) stage 3, GFR 30-59 ml/min   . Gouty arthritis   . Depression     Past Surgical History  Procedure Laterality Date  . Arm surgery Right [phone]    "@ least 3 ORs; took nerves out of my left leg and put them in my arm" (10/12/2012)  . Cardiac catheterization  03/28/12    20% prox and mid LAD, 100% distal LAD, 30% prox OM, 100% distal OM, 40% prox RCA, 40% mid RCA, 25% distal RCA, small PDA w/ 80% diffuse dz, PLB small w/ 50% diffuse stenoses. No focal lesions for PCI. Recommendations for continued medical management and aggressive RF reduction  . Refractive surgery Bilateral ~ 2005  . Eye surgery Left 1990's    "blood vessel ruptured" (10/12/2012)    History   Social History  . Marital Status: Married    Spouse Name: N/A    Number of Children: N/A  . Years of Education: N/A   Occupational History  . Not on file.   Social History Main Topics  . Smoking status: Former Smoker -- 0.50 packs/day for 25 years    Types: Cigarettes  . Smokeless tobacco: Never Used  . Alcohol Use: No     Comment: 10/12/2012 "quit drinking > 10 yr ago; was  an alcoholic"  . Drug Use: Yes    Special: Cocaine, "Crack" cocaine, Marijuana     Comment: 10/12/2012 "it's been 14-15 years since I've used cocaine"  . Sexual Activity: Yes   Other Topics Concern  . Not on file   Social History Narrative  . No narrative on file    Family History  Problem Relation Age of Onset  . Heart attack Father 50    No current facility-administered medications on file prior to encounter.   Current Outpatient Prescriptions on File Prior to Encounter  Medication Sig Dispense Refill  . acetaminophen (TYLENOL) 500 MG tablet Take 500 mg by mouth every 6 (six) hours as needed. For pain      . amLODipine (NORVASC) 5 MG tablet Take 5 mg by mouth daily.      Marland Kitchen aspirin 81 MG chewable tablet Chew 81 mg by mouth daily.      . cholecalciferol (VITAMIN D) 400 UNITS TABS tablet Take 400 Units by mouth daily.      . fish oil-omega-3 fatty acids 1000 MG capsule Take 1 g by mouth daily.      . furosemide (LASIX) 40 MG tablet Take 40 mg by mouth 2 (two) times daily.      Marland Kitchen  glipiZIDE (GLUCOTROL) 5 MG tablet Take 1 tablet (5 mg total) by mouth daily.  30 tablet  1  . nitroGLYCERIN (NITROSTAT) 0.4 MG SL tablet Place 1 tablet (0.4 mg total) under the tongue every 5 (five) minutes as needed for chest pain.  25 tablet  11  . ondansetron (ZOFRAN) 4 MG tablet Take 4 mg by mouth every 8 (eight) hours as needed for nausea or vomiting.      Marland Kitchen. HYDROmorphone (DILAUDID) 2 MG tablet Take 1 tablet (2 mg total) by mouth every 4 (four) hours as needed for severe pain.  20 tablet  0  . lovastatin (MEVACOR) 20 MG tablet Take two tablets by mouth every night.  60 tablet  11  . metoCLOPramide (REGLAN) 10 MG tablet Take 1 tablet (10 mg total) by mouth every 6 (six) hours as needed for nausea.  30 tablet  0    Allergies  Allergen Reactions  . Vytorin [Ezetimibe-Simvastatin]     Weakness, muscle aches bad.  . Oxycodone-Acetaminophen Nausea And Vomiting    Vomiting      Review of Systems: As  listed above, otherwise negative.  Physical Examination  Filed Vitals:   07/10/13 1400 07/10/13 1846 07/10/13 1958 07/11/13 0610  BP: 131/81 160/90 141/84 175/89  Pulse: 90 85 73 79  Temp: 97.9 F (36.6 C)  98 F (36.7 C) 97.7 F (36.5 C)  TempSrc: Oral  Oral Oral  Resp: 20 20 18    Height:      Weight:    217 lb 8 oz (98.657 kg)  SpO2: 98% 98% 98% 100%    General: A&O x 3, WDWN  Pulmonary: Sym exp, good air movt, CTAB, no rales, rhonchi, & wheezing  Cardiac: RRR, Nl S1, S2, no Murmurs, rubs or gallops  Gastrointestinal: soft, NTND, -G/R, - HSM, - masses, - CVAT B  Musculoskeletal: M/S 5/5 throughout   Laboratory: CBC:    Component Value Date/Time   WBC 9.0 07/11/2013 0359   RBC 3.46* 07/11/2013 0359   HGB 10.3* 07/11/2013 0359   HCT 30.0* 07/11/2013 0359   PLT 326 07/11/2013 0359   MCV 86.7 07/11/2013 0359   MCH 29.8 07/11/2013 0359   MCHC 34.3 07/11/2013 0359   RDW 12.0 07/11/2013 0359   LYMPHSABS 2.0 07/09/2013 1210   MONOABS 0.7 07/09/2013 1210   EOSABS 0.1 07/09/2013 1210   BASOSABS 0.0 07/09/2013 1210    BMP:    Component Value Date/Time   NA 136* 07/10/2013 2308   K 4.5 07/10/2013 2308   CL 99 07/10/2013 2308   CO2 21 07/10/2013 2308   GLUCOSE 145* 07/10/2013 2308   BUN 44* 07/10/2013 2308   CREATININE 5.08* 07/10/2013 2308   CALCIUM 8.7 07/10/2013 2308   GFRNONAA 13* 07/10/2013 2308   GFRAA 15* 07/10/2013 2308    Coagulation: Lab Results  Component Value Date   INR 0.9 01/02/2007   No results found for this basename: PTT   Medical Decision Making  Donneta RombergShawn L Quinonez is a 43 y.o. male who presents with: R leg chronic PAD with rest pain, CKD Stage V   This patient has known CKD V and has refused two previous offers of angiography.  He is well aware that he is at risk of proceeding in ESRD, with or without angiography.  The patient is scheduled for: Aortogram, Right leg runoff, and possible intervention I discussed with the patient the nature of angiographic procedures, especially the  limited patencies of any endovascular intervention.  The patient  is aware of that the risks of an angiographic procedure include but are not limited to: bleeding, infection, access site complications, renal failure, embolization, rupture of vessel, dissection, possible need for emergent surgical intervention, possible need for surgical procedures to treat the patient's pathology, and stroke and death.    The patient is aware of the risks and agrees to proceed.  Leonides Sake, MD [facility] [place] Office: [phone] Pager: [phone]  07/11/2013, 8:31 AM

## 2013-07-11 NOTE — Op Note (Signed)
OPERATIVE NOTE   PROCEDURE: 1.  Left common femoral artery cannulation under ultrasound guidance 2.  Placement of catheter in aorta 3.  Aortogram with carbon dioxide 4.  Right leg runoff with carbon dioxide and IV contrast  PRE-OPERATIVE DIAGNOSIS: Right foot ischemia  POST-OPERATIVE DIAGNOSIS: same as above   SURGEON: Leonides Sake, MD  ANESTHESIA: conscious sedation  ESTIMATED BLOOD LOSS: 30 cc  CONTRAST: 36 cc IV contrast, 420 cc carbon dioxide  FINDING(S):  Aorta: Patent  Superior mesenteric artery: not visualized Celiac artery: not visualized   Right Left  RA Patent Patent  CIA Patent Patent with ~50% stenosis distally  EIA Patent Patent with mid-segment stenosis ~50%  IIA Patent Patent  CFA Patent Patent  SFA Patent but diffusely diseased with 90% stenosis distally   PFA Patent   Pop Patent   Trif Patent   AT Patent proximally, dwindles distally providing minimal runoff to foot   Pero Patent proximally but diminutive,  Not well visualized distally   PT Patent proximally with >90% stenosis distally, collaterals reconstitute distal flow    R foot: Limit flow via PT system visualized, no AT/peroneal contribution well visualized  SPECIMEN(S):  none  INDICATIONS:   [name] is a 43 y.o. male who presents with right foot ischemia.  The patient had chronic kidney disease stage V so he has refused two prior offers for angiography.  The patient presents for: aortogram and right leg runoff.  I discussed with the patient the nature of angiographic procedures, especially the limited patencies of any endovascular intervention.  The patient is aware of that the risks of an angiographic procedure include but are not limited to: bleeding, infection, access site complications, renal failure, embolization, rupture of vessel, dissection, possible need for emergent surgical intervention, possible need for surgical procedures to treat the patient's pathology, and stroke and death.   The risk of becoming ESRD has been emphasized repeatedly.  The patient is aware of the risks and agrees to proceed.  DESCRIPTION: After full informed consent was obtained from the patient, the patient was brought back to the angiography suite.  The patient was placed supine upon the angiography table and connected to monitoring equipment.  The patient was then given conscious sedation, the amounts of which are documented in the patient's chart.  The patient was prepped and drape in the standard fashion for an angiographic procedure.  At this point, attention was turned to the left groin.  Under ultrasound guidance, the left common femoral artery will be cannulated with a 18 gauge needle.  The [facility] wire would not pass up the left femoral wire due to heavy calcification.  I pulled the wire and needle out and held pressure to the left groin for 3 minutes.  Under ultrasound guidance, the left common femoral artery was cannulated with a micropuncture needle.  The microwire was advanced into the iliac arterial system.  The needle was exchanged for a microsheath, which was loaded into the common femoral artery over the wire.  The microwire was exchanged for a [facility] wire which was advanced into the aorta.  The microsheath was then exchanged for a 5-Fr sheath which was loaded into the common femoral artery.  The Omniflush catheter was then loaded over the wire up to the level of L1.  The catheter was connected to the carbon dioxide circuit.  After de-airring and de-clotting the circuit, a carbon dioxide aortogram was completed.  The findings are listed above.  I pulled the catheter  down to distal aorta.  A bilateral pelvic injection was completed.  A RAO pelvic injection was completed to evaluate the right internal iliac artery patency.  [facility] wire was replaced in the catheter, and using the [place] and Omniflush, the right common iliac artery was selected.  The catheter and wire were advanced into the external  iliac artery.  The right leg runoff was completed in station with carbon dioxide injections.  The images were adequate until the tibial level.  At this level, the catheter was connected to the power injector circuit.  A tibial injection and a lateral foot projection were obtained.  The findings are listed above.    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  Dr. Darrick PennaFields will review the images before deciding on the next step with this patient.  COMPLICATIONS: none  CONDITION: stable  Leonides SakeBrian Shjon Lizarraga, MD [facility] [place] Office: [phone] Pager: [phone]  07/11/2013, 12:39 PM

## 2013-07-11 NOTE — Progress Notes (Signed)
Pt returned to room 3e18 from cath lab via bed accompanied by RN. Pt A&Ox 4. Complaining of pain in R great toe, states pain is an 8/10. PRN pain medication given. Dressing to R groin intact. VSS. Will continue to monitor.

## 2013-07-12 DIAGNOSIS — N183 Chronic kidney disease, stage 3 unspecified: Secondary | ICD-10-CM

## 2013-07-12 LAB — CBC
HCT: 33.5 % — ABNORMAL LOW (ref 39.0–52.0)
Hemoglobin: 11.6 g/dL — ABNORMAL LOW (ref 13.0–17.0)
MCH: 30.1 pg (ref 26.0–34.0)
MCHC: 34.6 g/dL (ref 30.0–36.0)
MCV: 87 fL (ref 78.0–100.0)
Platelets: 367 10*3/uL (ref 150–400)
RBC: 3.85 MIL/uL — ABNORMAL LOW (ref 4.22–5.81)
RDW: 12.1 % (ref 11.5–15.5)
WBC: 8.9 10*3/uL (ref 4.0–10.5)

## 2013-07-12 LAB — BASIC METABOLIC PANEL
BUN: 43 mg/dL — ABNORMAL HIGH (ref 6–23)
CALCIUM: 8.9 mg/dL (ref 8.4–10.5)
CO2: 18 mEq/L — ABNORMAL LOW (ref 19–32)
Chloride: 100 mEq/L (ref 96–112)
Creatinine, Ser: 4.9 mg/dL — ABNORMAL HIGH (ref 0.50–1.35)
GFR, EST AFRICAN AMERICAN: 15 mL/min — AB (ref >90)
GFR, EST NON AFRICAN AMERICAN: 13 mL/min — AB (ref >90)
Glucose, Bld: 177 mg/dL — ABNORMAL HIGH (ref 70–99)
Potassium: 5 mEq/L (ref 3.7–5.3)
Sodium: 135 mEq/L — ABNORMAL LOW (ref 137–147)

## 2013-07-12 LAB — GLUCOSE, CAPILLARY
GLUCOSE-CAPILLARY: 154 mg/dL — AB (ref 70–99)
GLUCOSE-CAPILLARY: 138 mg/dL — AB (ref 70–99)
GLUCOSE-CAPILLARY: 158 mg/dL — AB (ref 70–99)
Glucose-Capillary: 158 mg/dL — ABNORMAL HIGH (ref 70–99)

## 2013-07-12 MED ORDER — ZOLPIDEM TARTRATE 5 MG PO TABS
5.0000 mg | ORAL_TABLET | Freq: Every evening | ORAL | Status: DC | PRN
  Administered 2013-07-12 – 2013-07-13 (×2): 5 mg via ORAL
  Filled 2013-07-12 (×2): qty 1

## 2013-07-12 NOTE — Progress Notes (Signed)
[facility] PROGRESS NOTE Assessment/Plan:  Ischemic Toe/Chronic PVD : - Seen by VVS,  S/p  angiogram on 3/04. ASA. Possible Right fem pop bypass next week vs stenting. Awaiting recommendations. - Continue PRN analgesics and IVF's  - Continued aspirin, statins. - No anticoagulation.  Non-ischemic cardiomyopathy: - Last EF 40-45%  - Currently compensated  - No JVD and no crackles on exam  - follow daily weight, close intake and output.  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  - patient needs AV fistula access prior to discharge  - Might need HD. b-met daily to monitor Cr. - Monitor electrolytes closely.   DM type 2  - continue SSI; CBG's stable   HTN  - Continue with Carvedilol, amlodipine.  CAD  - Currently without chest pain or shortness of breath. Continue with aspirin, Coreg and statins.  - No CP and no SOB   Hyperlipidemia  - Continue statin   Constipation  -Due to narcotics  -Continue Miralax   Colitis  - Continue Flagyl and Cipro  - Patient reprots abd pain is pretty much resolved  - WBC's WNL now.   Code Status: Full  Family Communication: no family at bedside  Disposition Plan: to be determine    Consultants:  VVS  Procedures:  Angiogram 3.4.2015  Antibiotics:  Cipro 3.2.2015  HPI/Subjective: No complains.  Objective: Filed Vitals:   07/11/13 2001 07/12/13 0544 07/12/13 0549 07/12/13 0956  BP: 154/73  159/86 161/91  Pulse: 78 74 74 68  Temp: 98.6 F (37 C)  97.5 F (36.4 C)   TempSrc: Oral  Oral   Resp: 18  18   Height:      Weight:   99.474 kg (219 lb 4.8 oz)   SpO2: 100%  100%     Intake/Output Summary (Last 24 hours) at 07/12/13 1021 Last data filed at 07/12/13 0800  Gross per 24 hour  Intake   2355 ml  Output   1480 ml  Net    875 ml   Filed Weights   07/10/13 0613 07/11/13 0610 07/12/13 0549  Weight: 97.2 kg (214 lb 4.6 oz) 98.657 kg (217 lb 8 oz) 99.474 kg (219 lb  4.8 oz)    Exam:  General: Alert, awake, oriented x3, in no acute distress.  HEENT: No bruits, no goiter.  Heart: Regular rate and rhythm, without murmurs, rubs, gallops.  Lungs: Good air movement, clear. Abdomen: Soft, nontender, nondistended, positive bowel sounds.    Data Reviewed: Basic Metabolic Panel:  Recent Labs Lab 07/08/13 0007 07/09/13 1210 07/10/13 0305 07/10/13 2308 07/11/13 0735 07/11/13 1440  NA 137 138 138 136* 136*  --   K 4.2 3.7 4.3 4.5 4.4  --   CL 98 100 100 99 99  --   CO2 28 24 21 21 22   --   GLUCOSE 92 201* 134* 145* 158*  --   BUN 41* 40* 40* 44* 40*  --   CREATININE 4.87* 5.16* 5.24* 5.08* 4.91* 4.86*  CALCIUM 8.7 8.6 8.7 8.7 8.6  --    Liver Function Tests:  Recent Labs Lab 07/08/13 0007 07/09/13 1210  AST 13 16  ALT 7 8  ALKPHOS 47 58  BILITOT 0.3 0.2*  PROT 6.7 6.5  ALBUMIN 2.6* 2.5*    Recent Labs Lab 07/08/13 0007  LIPASE 26   No results found for this basename: AMMONIA,  in the last 168 hours CBC:  Recent Labs Lab 07/08/13  0007 07/09/13 1210 07/10/13 0305 07/11/13 0359 07/11/13 1440  WBC 13.2* 10.6* 8.8 9.0 8.0  NEUTROABS 10.6* 7.8*  --   --   --   HGB 10.0* 10.6* 9.8* 10.3* 11.5*  HCT 29.3* 30.6* 28.7* 30.0* 32.2*  MCV 88.8 87.2 88.0 86.7 86.1  PLT 296 312 314 326 319   Cardiac Enzymes:  Recent Labs Lab 07/08/13 0007  TROPONINI <0.30   BNP (last 3 results)  Recent Labs  10/13/12 0350  PROBNP 445.0*   CBG:  Recent Labs Lab 07/11/13 1120 07/11/13 1250 07/11/13 1622 07/11/13 2113 07/12/13 0621  GLUCAP 146* 127* 169* 198* 154*    Recent Results (from the past 240 hour(s))  MRSA PCR SCREENING     Status: None   Collection Time    07/09/13  6:44 PM      Result Value Ref Range Status   MRSA by PCR NEGATIVE  NEGATIVE Final   Comment:            The GeneXpert MRSA Assay (FDA     approved for NASAL specimens     only), is one component of a     comprehensive MRSA colonization      surveillance program. It is not     intended to diagnose MRSA     infection nor to guide or     monitor treatment for     MRSA infections.     Studies: No results found.  Scheduled Meds: . amLODipine  5 mg Oral Daily  . aspirin  81 mg Oral Daily  . carvedilol  25 mg Oral BID WC  . cholecalciferol  400 Units Oral Daily  . ciprofloxacin  500 mg Oral Q2000  . heparin  5,000 Units Subcutaneous 3 times per day  . insulin aspart  0-9 Units Subcutaneous TID WC  . metroNIDAZOLE  500 mg Oral TID  . omega-3 acid ethyl esters  1 g Oral Daily  . polyethylene glycol  17 g Oral Daily  . simvastatin  20 mg Oral q1800   Continuous Infusions: . sodium chloride 75 mL/hr at 07/11/13 [place], ABRAHAM  [facility] Pager [phone]. If 8PM-8AM, please contact night-coverage at [email or web address], password [facility] 07/12/2013, 10:21 AM  LOS: 3 days

## 2013-07-12 NOTE — Progress Notes (Signed)
Assessment:  1 Stage 5 CKD s/p contrast dye exposure 2 Diabetes with complications  3 Peripheral Vascular disease  4 Hypertension  Rec:  Per VVS He will need an AV access before he leaves the hospital  Labs in AM  Subjective: Interval History: 1 day post contrast  Objective: Vital signs in last 24 hours: Temp:  [97.5 F (36.4 C)-98.6 F (37 C)] 97.5 F (36.4 C) (03/05 0549) Pulse Rate:  [68-78] 68 (03/05 0956) Resp:  [18] 18 (03/05 0549) BP: (154-161)/(73-91) 161/91 mmHg (03/05 0956) SpO2:  [100 %] 100 % (03/05 0549) Weight:  [99.474 kg (219 lb 4.8 oz)] 99.474 kg (219 lb 4.8 oz) (03/05 0549) Weight change: 0.816 kg (1 lb 12.8 oz)  Intake/Output from previous day: 03/04 0701 - 03/05 0700 In: 1935 [P.O.:960; I.V.:975] Out: 1480 [Urine:1480] Intake/Output this shift: Total I/O In: 420 [P.O.:420] Out: -   General appearance: alert and cooperative Resp: clear to auscultation bilaterally Chest wall: no tenderness Cardio: regular rate and rhythm, S1, S2 normal, no murmur, click, rub or gallop  Lab Results:  Recent Labs  07/11/13 1440 07/12/13 1000  WBC 8.0 8.9  HGB 11.5* 11.6*  HCT 32.2* 33.5*  PLT 319 367   BMET:  Recent Labs  07/10/13 2308 07/11/13 0735 07/11/13 1440  NA 136* 136*  --   K 4.5 4.4  --   CL 99 99  --   CO2 21 22  --   GLUCOSE 145* 158*  --   BUN 44* 40*  --   CREATININE 5.08* 4.91* 4.86*  CALCIUM 8.7 8.6  --    No results found for this basename: PTH,  in the last 72 hours Iron Studies: No results found for this basename: IRON, TIBC, TRANSFERRIN, FERRITIN,  in the last 72 hours Studies/Results: No results found.  Scheduled: . amLODipine  5 mg Oral Daily  . aspirin  81 mg Oral Daily  . carvedilol  25 mg Oral BID WC  . cholecalciferol  400 Units Oral Daily  . ciprofloxacin  500 mg Oral Q2000  . heparin  5,000 Units Subcutaneous 3 times per day  . insulin aspart  0-9 Units Subcutaneous TID WC  . metroNIDAZOLE  500 mg Oral TID  .  omega-3 acid ethyl esters  1 g Oral Daily  . polyethylene glycol  17 g Oral Daily  . simvastatin  20 mg Oral q1800      LOS: 3 days   Eulalah Rupert C 07/12/2013,11:32 AM

## 2013-07-12 NOTE — Progress Notes (Signed)
Patient requested sleeping pill for this evening to help sleep. NP on call notified. Awaiting new orders. RN will continue to monitor. Louretta Parma, RN

## 2013-07-12 NOTE — Consult Note (Signed)
Right first toe dry less pain today Filed Vitals:   07/11/13 1436 07/11/13 2001 07/12/13 0544 07/12/13 0549  BP: 160/85 154/73  159/86  Pulse: 76 78 74 74  Temp: 97.9 F (36.6 C) 98.6 F (37 C)  97.5 F (36.4 C)  TempSrc: Oral Oral  Oral  Resp: 18 18  18   Height:      Weight:    219 lb 4.8 oz (99.474 kg)  SpO2: 100% 100%  100%    No hematoma in groin  Angiogram reviewed.  Options include right SFA stent which would have greater contrast load and less durability vs right fem pop bypass longer durability but bigger operation.  Toe currently is stable.  Could proceed with bypass early next week.  BMET and CBC pending today.  Will need to clarify with Dr Lowell Guitar whether or not to place dialysis catheter this admission.  Would not consider placing permanent access until right leg issues are resolved.  Will discuss with pt in am to make final plans.  Fabienne Bruns, MD [facility] [place] Office: [phone] Pager: [phone]

## 2013-07-12 NOTE — Progress Notes (Signed)
ANTIBIOTIC CONSULT NOTE  Pharmacy Consult for Cipro Indication: colitis  Allergies  Allergen Reactions  . Vytorin [Ezetimibe-Simvastatin]     Weakness, muscle aches bad.  . Oxycodone-Acetaminophen Nausea And Vomiting    Vomiting      Patient Measurements: Height: 5\' 9"  (175.3 cm) Weight: 219 lb 4.8 oz (99.474 kg) IBW/kg (Calculated) : 70.7  Vital Signs: Temp: 97.5 F (36.4 C) (03/05 0549) Temp src: Oral (03/05 0549) BP: 161/91 mmHg (03/05 0956) Pulse Rate: 68 (03/05 0956)  Labs:  Recent Labs  07/11/13 0359 07/11/13 0735 07/11/13 1440 07/12/13 1000  WBC 9.0  --  8.0 8.9  HGB 10.3*  --  11.5* 11.6*  PLT 326  --  319 367  CREATININE  --  4.91* 4.86* 4.90*   Estimated Creatinine Clearance: 22.6 ml/min (by C-G formula based on Cr of 4.9).  Assessment: Day # 5 of 10 Cipro and Flagyl for colitis. On cipro 500 mg PO daily adjusted for renal fx. 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 WBC wnl. Pt afebrile   Goal of Therapy:   appropriate Cipro dose for renal function  Plan:  Continue Cipro 500 mg PO q24hrs. Planning Cipro and Flagyl for 5 more days. Will follow up renal function, to be sure Cipro dose does not need further adjustment.  Vinnie Level, PharmD.  Clinical Pharmacist Pager [phone]

## 2013-07-12 NOTE — Progress Notes (Signed)
Pt a/o, pt c/o pain in right toe PRN percocet and oxycodone given as ordered, vss, pt stable

## 2013-07-13 DIAGNOSIS — Z01810 Encounter for preprocedural cardiovascular examination: Secondary | ICD-10-CM

## 2013-07-13 LAB — BASIC METABOLIC PANEL
BUN: 43 mg/dL — ABNORMAL HIGH (ref 6–23)
CALCIUM: 8.3 mg/dL — AB (ref 8.4–10.5)
CO2: 20 mEq/L (ref 19–32)
CREATININE: 5.19 mg/dL — AB (ref 0.50–1.35)
Chloride: 104 mEq/L (ref 96–112)
GFR, EST AFRICAN AMERICAN: 14 mL/min — AB (ref >90)
GFR, EST NON AFRICAN AMERICAN: 12 mL/min — AB (ref >90)
Glucose, Bld: 154 mg/dL — ABNORMAL HIGH (ref 70–99)
Potassium: 4.6 mEq/L (ref 3.7–5.3)
Sodium: 137 mEq/L (ref 137–147)

## 2013-07-13 LAB — GLUCOSE, CAPILLARY
GLUCOSE-CAPILLARY: 201 mg/dL — AB (ref 70–99)
GLUCOSE-CAPILLARY: 145 mg/dL — AB (ref 70–99)
Glucose-Capillary: 149 mg/dL — ABNORMAL HIGH (ref 70–99)
Glucose-Capillary: 206 mg/dL — ABNORMAL HIGH (ref 70–99)

## 2013-07-13 MED ORDER — OXYCODONE HCL 5 MG PO TABS
15.0000 mg | ORAL_TABLET | ORAL | Status: DC | PRN
  Administered 2013-07-13 – 2013-07-14 (×3): 15 mg via ORAL
  Filled 2013-07-13 (×3): qty 3

## 2013-07-13 MED ORDER — SORBITOL 70 % SOLN
30.0000 mL | Freq: Every day | Status: DC | PRN
  Administered 2013-07-13: 30 mL via ORAL
  Filled 2013-07-13: qty 30

## 2013-07-13 NOTE — Care Management Note (Signed)
    Page 1 of 1   07/13/2013     2:48:44 PM   CARE MANAGEMENT NOTE 07/13/2013  Patient:  [name], [name]   Account Number:  [id number]  Date Initiated:  07/13/2013  Documentation initiated by:  [facility]  Subjective/Objective Assessment:   43 y.o. male who presents with: R leg chronic PAD with rest pain, CKD Stage V     //Home with spouse     Action/Plan:   admit, narcotics as needed for pain, gently hydrate.//Offer HH services   Anticipated DC Date:  07/23/2013   Anticipated DC Plan:  HOME W HOME HEALTH SERVICES      DC Planning Services  CM consult      Choice offered to / List presented to:             Status of service:  In process, will continue to follow Medicare Important Message given?   (If response is "NO", the following Medicare IM given date fields will be blank) Date Medicare IM given:   Date Additional Medicare IM given:    Discharge Disposition:    Per UR Regulation:    If discussed at Long Length of Stay Meetings, dates discussed:    Comments:

## 2013-07-13 NOTE — Progress Notes (Signed)
Pt a/o, c/o pain in his right foot, PRN percocet given as ordered, vss, pt stable

## 2013-07-13 NOTE — Progress Notes (Signed)
VASCULAR LAB PRELIMINARY  PRELIMINARY  PRELIMINARY  PRELIMINARY  Right Lower Extremity Vein Map    Right Great Saphenous Vein   Segment Diameter Comment  1. Origin 4.70mm   2. High Thigh 2.83mm   3. Mid Thigh 3.73mm branch  4. Low Thigh 2.58mm   5. At Knee 3.18mm   6. High Calf 3.72mm   7. Low Calf 3.40mm branch  8. Ankle 2.49mm    mm    mm    mm    Right Small Saphenous Vein - <73mm      Left Lower Extremity Vein Map    Left Great Saphenous Vein  Segment Diameter Comment  1. Origin 4.15mm   2. High Thigh 3.27mm   3. Mid Thigh 3.48mm branch  4. Low Thigh 2.22mm Narrowing mid to distal thigh  5. At Knee 2.51mm   6. High Calf 2.2mm   7. Low Calf 2.32mm   8. Ankle 2.63mm    mm    mm    mm     Left Small Saphenous Vein <65mm    Farrel Demark, RDMS, RVT  07/13/2013, 1:20 PM

## 2013-07-13 NOTE — Progress Notes (Signed)
Assessment:  1 Stage 5 CKD s/p contrast dye exposure, stable function 2 Diabetes with complications  3 Peripheral Vascular disease  4 Hypertension  Rec:  Per VVS FemPop next week I spoke to Dr. Darrick Penna..will plan AV access before he leaves the hospital next week; and PC only if needed Labs in AM  Subjective: Interval History: pain controlled  Objective: Vital signs in last 24 hours: Temp:  [97.4 F (36.3 C)-97.6 F (36.4 C)] 97.5 F (36.4 C) (03/06 0412) Pulse Rate:  [77-79] 79 (03/06 1006) Resp:  [18-19] 19 (03/06 0412) BP: (125-150)/(67-72) 142/67 mmHg (03/06 1006) SpO2:  [99 %-100 %] 99 % (03/06 0412) Weight:  [101.016 kg (222 lb 11.2 oz)] 101.016 kg (222 lb 11.2 oz) (03/06 0408) Weight change: 1.542 kg (3 lb 6.4 oz)  Intake/Output from previous day: 03/05 0701 - 03/06 0700 In: 1862.5 [P.O.:780; I.V.:1082.5] Out: 200 [Urine:200] Intake/Output this shift: Total I/O In: 240 [P.O.:240] Out: 0   General appearance: alert and cooperative Extremities: edema 1-2+ rle  Lab Results:  Recent Labs  07/11/13 1440 07/12/13 1000  WBC 8.0 8.9  HGB 11.5* 11.6*  HCT 32.2* 33.5*  PLT 319 367   BMET:  Recent Labs  07/12/13 1000 07/13/13 0402  NA 135* 137  K 5.0 4.6  CL 100 104  CO2 18* 20  GLUCOSE 177* 154*  BUN 43* 43*  CREATININE 4.90* 5.19*  CALCIUM 8.9 8.3*   No results found for this basename: PTH,  in the last 72 hours Iron Studies: No results found for this basename: IRON, TIBC, TRANSFERRIN, FERRITIN,  in the last 72 hours Studies/Results: No results found.  Scheduled: . amLODipine  5 mg Oral Daily  . aspirin  81 mg Oral Daily  . carvedilol  25 mg Oral BID WC  . cholecalciferol  400 Units Oral Daily  . ciprofloxacin  500 mg Oral Q2000  . heparin  5,000 Units Subcutaneous 3 times per day  . insulin aspart  0-9 Units Subcutaneous TID WC  . omega-3 acid ethyl esters  1 g Oral Daily  . polyethylene glycol  17 g Oral Daily  . simvastatin  20 mg Oral  q1800     LOS: 4 days   Rocquel Askren C 07/13/2013,11:04 AM

## 2013-07-13 NOTE — Consult Note (Addendum)
   Filed Vitals:   07/12/13 2133 07/13/13 0408 07/13/13 0412 07/13/13 1006  BP: 139/71  125/70 142/67  Pulse: 77  77 79  Temp: 97.4 F (36.3 C)  97.5 F (36.4 C)   TempSrc: Oral  Oral   Resp: 18  19   Height:      Weight:  222 lb 11.2 oz (101.016 kg)    SpO2: 99%  99%    No real change in toe Angiogram reviewed  BMET    Component Value Date/Time   NA 137 07/13/2013 0402   K 4.6 07/13/2013 0402   CL 104 07/13/2013 0402   CO2 20 07/13/2013 0402   GLUCOSE 154* 07/13/2013 0402   BUN 43* 07/13/2013 0402   CREATININE 5.19* 07/13/2013 0402   CALCIUM 8.3* 07/13/2013 0402   GFRNONAA 12* 07/13/2013 0402   GFRAA 14* 07/13/2013 0402      Will plan for right fem pop next week.  Will place Diatek catheter at that time as well  Procedure risk benefit discussed with pt.  Will get vein mapping of legs preop.  Fabienne Bruns, MD [facility] [place] Office: [phone] Pager: [phone]

## 2013-07-13 NOTE — Progress Notes (Signed)
[facility] PROGRESS NOTE Assessment/Plan:  Ischemic Toe/Chronic PVD : - Seen by [facility],  S/p  angiogram on 3/04. ASA. Possible Right fem pop bypass on 3.10.2015 - Continue PRN analgesics and IVF's  - Continued aspirin, statins. - No anticoagulation.  Non-ischemic cardiomyopathy: - Last EF 40-45%  - Currently compensated  - No JVD and no crackles on exam  - follow daily weight, close intake and output.  Acute on CKD stage 4-5  - Hold IV fluids, weight now 101.0 kg, renal service on board.  - patient needs AV fistula access prior to discharge  - Might need HD. b-met daily to monitor Cr. - Monitor electrolytes closely.   DM type 2  - continue SSI; CBG's stable   HTN  - Continue with Carvedilol, amlodipine.  CAD  - Currently without chest pain or shortness of breath. Continue with aspirin, Coreg and statins.  - No CP and no SOB   Hyperlipidemia  - Continue statin   Constipation  -Due to narcotics  -Continue Miralax   Colitis  - Continue Flagyl and Cipro  - Patient reprots abd pain is pretty much resolved  - WBC's WNL now.   Code Status: Full  Family Communication: no family at bedside  Disposition Plan: to be determine    Consultants:  VVS  Procedures:  Angiogram 3.4.2015  Antibiotics:  Cipro 3.2.2015  HPI/Subjective: No complains.  Objective: Filed Vitals:   07/12/13 2133 07/13/13 0408 07/13/13 0412 07/13/13 1006  BP: 139/71  125/70 142/67  Pulse: 77  77 79  Temp: 97.4 F (36.3 C)  97.5 F (36.4 C)   TempSrc: Oral  Oral   Resp: 18  19   Height:      Weight:  101.016 kg (222 lb 11.2 oz)    SpO2: 99%  99%     Intake/Output Summary (Last 24 hours) at 07/13/13 1006 Last data filed at 07/13/13 0934  Gross per 24 hour  Intake 1682.5 ml  Output    200 ml  Net 1482.5 ml   Filed Weights   07/11/13 0610 07/12/13 0549 07/13/13 0408  Weight: 98.657 kg (217 lb 8 oz) 99.474 kg (219 lb 4.8 oz) 101.016 kg (222 lb 11.2 oz)     Exam:  General: Alert, awake, oriented x3, in no acute distress.  HEENT: No bruits, no goiter.  Heart: Regular rate and rhythm, without murmurs, rubs, gallops.  Lungs: Good air movement, clear. Abdomen: Soft, nontender, nondistended, positive bowel sounds.    Data Reviewed: Basic Metabolic Panel:  Recent Labs Lab 07/10/13 0305 07/10/13 2308 07/11/13 0735 07/11/13 1440 07/12/13 1000 07/13/13 0402  NA 138 136* 136*  --  135* 137  K 4.3 4.5 4.4  --  5.0 4.6  CL 100 99 99  --  100 104  CO2 21 21 22   --  18* 20  GLUCOSE 134* 145* 158*  --  177* 154*  BUN 40* 44* 40*  --  43* 43*  CREATININE 5.24* 5.08* 4.91* 4.86* 4.90* 5.19*  CALCIUM 8.7 8.7 8.6  --  8.9 8.3*   Liver Function Tests:  Recent Labs Lab 07/08/13 0007 07/09/13 1210  AST 13 16  ALT 7 8  ALKPHOS 47 58  BILITOT 0.3 0.2*  PROT 6.7 6.5  ALBUMIN 2.6* 2.5*    Recent Labs Lab 07/08/13 0007  LIPASE 26   No results found for this basename: AMMONIA,  in the last 168 hours CBC:  Recent Labs Lab 07/08/13 0007 07/09/13 1210 07/10/13  5329 07/11/13 0359 07/11/13 1440 07/12/13 1000  WBC 13.2* 10.6* 8.8 9.0 8.0 8.9  NEUTROABS 10.6* 7.8*  --   --   --   --   HGB 10.0* 10.6* 9.8* 10.3* 11.5* 11.6*  HCT 29.3* 30.6* 28.7* 30.0* 32.2* 33.5*  MCV 88.8 87.2 88.0 86.7 86.1 87.0  PLT 296 312 314 326 319 367   Cardiac Enzymes:  Recent Labs Lab 07/08/13 0007  TROPONINI <0.30   BNP (last 3 results)  Recent Labs  10/13/12 0350  PROBNP 445.0*   CBG:  Recent Labs Lab 07/12/13 0621 07/12/13 1104 07/12/13 1633 07/12/13 2129 07/13/13 0620  GLUCAP 154* 158* 138* 158* 149*    Recent Results (from the past 240 hour(s))  MRSA PCR SCREENING     Status: None   Collection Time    07/09/13  6:44 PM      Result Value Ref Range Status   MRSA by PCR NEGATIVE  NEGATIVE Final   Comment:            The GeneXpert MRSA Assay (FDA     approved for NASAL specimens     only), is one component of a      comprehensive MRSA colonization     surveillance program. It is not     intended to diagnose MRSA     infection nor to guide or     monitor treatment for     MRSA infections.     Studies: No results found.  Scheduled Meds: . amLODipine  5 mg Oral Daily  . aspirin  81 mg Oral Daily  . carvedilol  25 mg Oral BID WC  . cholecalciferol  400 Units Oral Daily  . ciprofloxacin  500 mg Oral Q2000  . heparin  5,000 Units Subcutaneous 3 times per day  . insulin aspart  0-9 Units Subcutaneous TID WC  . metroNIDAZOLE  500 mg Oral TID  . omega-3 acid ethyl esters  1 g Oral Daily  . polyethylene glycol  17 g Oral Daily  . simvastatin  20 mg Oral q1800   Continuous Infusions: . sodium chloride 75 mL/hr at 07/13/13 [place], [name]  [facility] Pager [phone]. If 8PM-8AM, please contact night-coverage at [email or web address], password [facility] 07/13/2013, 10:06 AM  LOS: 4 days

## 2013-07-14 LAB — BASIC METABOLIC PANEL
BUN: 41 mg/dL — AB (ref 6–23)
CALCIUM: 8.7 mg/dL (ref 8.4–10.5)
CO2: 21 meq/L (ref 19–32)
CREATININE: 4.96 mg/dL — AB (ref 0.50–1.35)
Chloride: 103 mEq/L (ref 96–112)
GFR calc Af Amer: 15 mL/min — ABNORMAL LOW (ref >90)
GFR calc non Af Amer: 13 mL/min — ABNORMAL LOW (ref >90)
Glucose, Bld: 139 mg/dL — ABNORMAL HIGH (ref 70–99)
Potassium: 5.4 mEq/L — ABNORMAL HIGH (ref 3.7–5.3)
Sodium: 136 mEq/L — ABNORMAL LOW (ref 137–147)

## 2013-07-14 LAB — GLUCOSE, CAPILLARY: GLUCOSE-CAPILLARY: 143 mg/dL — AB (ref 70–99)

## 2013-07-14 MED ORDER — SODIUM POLYSTYRENE SULFONATE 15 GM/60ML PO SUSP
30.0000 g | Freq: Once | ORAL | Status: AC
  Administered 2013-07-14: 30 g via ORAL
  Filled 2013-07-14: qty 120

## 2013-07-14 MED ORDER — METRONIDAZOLE 500 MG PO TABS: 500.0000 mg | ORAL_TABLET | Freq: Three times a day (TID) | ORAL | Status: DC

## 2013-07-14 NOTE — Discharge Summary (Signed)
Physician Discharge Summary  [name] ZOX:[id number] DOB: [birth year] DOA: 07/09/2013  PCP: Louie Boston, MD  Admit date: 07/09/2013 Discharge date: 07/14/2013  Time spent: 35 minutes  Recommendations for Outpatient Follow-up:  1. Follow up with PCP in 2 weeks. 2. For fem-pop surgery on 3.10.2015  Discharge Diagnoses:  Principal Problem:   Ischemic toe Active Problems:   Hyperlipidemia   Type 2 diabetes mellitus with diabetic chronic kidney disease   PAD (peripheral artery disease)   Essential hypertension, benign   Ischemic cardiomyopathy   Acute on chronic renal failure   Noninfectious gastroenteritis and colitis   CKD (chronic kidney disease), stage IV   Discharge Condition: stable  Diet recommendation: renal diet  Filed Weights   07/12/13 0549 07/13/13 0408 07/14/13 0616  Weight: 99.474 kg (219 lb 4.8 oz) 101.016 kg (222 lb 11.2 oz) 101.379 kg (223 lb 8 oz)    History of present illness:  43 y.o. AA male with a past medical history of peripheral vascular disease, coronary artery disease and medical management, diabetes, stage IV chronic kidney disease presenting with 'severe' pain in R great toe. Patient has had bilateral foot pain x 4 months with decreased sensation/ numbness/ tingling and burning sensations. The patient has been seen by vascular surgery in the past and was offered a arteriogram, however patient was very reluctant to proceed with the arteriogram given the fear of end-stage renal disease. Since his pain continued to worsen over the past 2 weeks, he presented to the emergency room today, for further evaluation.Patient was seen today by [facility] who suspect anterior tibial and posterior tibial arteries are occluded and advised he have an angiogram. Given his numerous medical comorbidities, worsening renal function, the hospitalist service was consulted for admission and further treatment.   Hospital Course:  Ischemic Toe/Chronic PVD :  -  Seen by VVS, S/p angiogram on 3/04 showed significant stenosis. ASA. Possible Right fem pop bypass on 3.10.2015  - Continue PRN analgesics.  - Continued aspirin, statins.  - No anticoagulation.   Non-ischemic cardiomyopathy:  - Last EF 40-45%  - Currently compensated  - No JVD and no crackles on exam  - follow daily weight, close intake and output.   Acute on CKD stage 4-5  - given IV fluids, weight now 101.0 kg, renal service on board.  - fistula on 3.10.2015.  DM type 2  - continue SSI; CBG's stable.  HTN  - Continue with Carvedilol, amlodipine.   CAD  - Currently without chest pain or shortness of breath. Continue with aspirin, Coreg and statins.  - No CP and no SOB   Hyperlipidemia  - Continue statin   Constipation  -Due to narcotics  -Continue Miralax   Colitis  - Continue Flagyl and Cipro  - Patient reprots abd pain is pretty much resolved    Procedures:  X-ray right  Consultations:  Renal   VVS  Discharge Exam: Filed Vitals:   07/14/13 0616  BP: 172/79  Pulse: 79  Temp: 97.8 F (36.6 C)  Resp: 18    General: A&O x3 Cardiovascular: RRR Respiratory: good air movement CTA B/L  Discharge Instructions      Discharge Orders   Future Appointments Provider Department Dept Phone   09/12/2013 11:00 AM [facility] [phone]   09/28/2013 10:00 AM Antoine Poche, MD [facility] [phone]   Future Orders Complete By Expires   Diet - low sodium heart  healthy  As directed    Increase activity slowly  As directed        Medication List         acetaminophen 500 MG tablet  Commonly known as:  TYLENOL  Take 500 mg by mouth every 6 (six) hours as needed. For pain     amLODipine 5 MG tablet  Commonly known as:  NORVASC  Take 5 mg by mouth daily.     aspirin 81 MG chewable tablet  Chew 81 mg by mouth daily.     carvedilol 12.5 MG tablet  Commonly known as:  COREG   Take 25 mg by mouth 2 (two) times daily with a meal.     cholecalciferol 400 UNITS Tabs tablet  Commonly known as:  VITAMIN D  Take 400 Units by mouth daily.     ciprofloxacin 500 MG tablet  Commonly known as:  CIPRO  Take 500 mg by mouth 2 (two) times daily. Pt on the 2 day of a 10 day course meds started 07/07/13     fish oil-omega-3 fatty acids 1000 MG capsule  Take 1 g by mouth daily.     furosemide 40 MG tablet  Commonly known as:  LASIX  Take 40 mg by mouth 2 (two) times daily.     glipiZIDE 5 MG tablet  Commonly known as:  GLUCOTROL  Take 1 tablet (5 mg total) by mouth daily.     HYDROmorphone 2 MG tablet  Commonly known as:  DILAUDID  Take 1 tablet (2 mg total) by mouth every 4 (four) hours as needed for severe pain.     lovastatin 20 MG tablet  Commonly known as:  MEVACOR  Take two tablets by mouth every night.     metoCLOPramide 10 MG tablet  Commonly known as:  REGLAN  Take 1 tablet (10 mg total) by mouth every 6 (six) hours as needed for nausea.     metroNIDAZOLE 500 MG tablet  Commonly known as:  FLAGYL  Take 1 tablet (500 mg total) by mouth 3 (three) times daily.     nitroGLYCERIN 0.4 MG SL tablet  Commonly known as:  NITROSTAT  Place 1 tablet (0.4 mg total) under the tongue every 5 (five) minutes as needed for chest pain.     ondansetron 4 MG tablet  Commonly known as:  ZOFRAN  Take 4 mg by mouth every 8 (eight) hours as needed for nausea or vomiting.     oxyCODONE-acetaminophen 10-325 MG per tablet  Commonly known as:  PERCOCET  Take 1 tablet by mouth every 6 (six) hours as needed for pain.     pravastatin 40 MG tablet  Commonly known as:  PRAVACHOL  Take 40 mg by mouth at bedtime.       Allergies  Allergen Reactions  . Vytorin [Ezetimibe-Simvastatin]     Weakness, muscle aches bad.  . Oxycodone-Acetaminophen Nausea And Vomiting    Vomiting     Follow-up Information   Follow up with TAPPER,DAVID B, MD In 1 week. (hospital follow up)     Specialty:  Family Medicine   Contact information:   [address] Raeanne Gathers [place] [address] [phone]        The results of significant diagnostics from this hospitalization (including imaging, microbiology, ancillary and laboratory) are listed below for reference.    Significant Diagnostic Studies: Dg Foot Complete Right  07/16/2013   CLINICAL DATA:  pain  EXAM: RIGHT FOOT COMPLETE - 3+ VIEW  COMPARISON:  None.  FINDINGS: There is no evidence of fracture or dislocation. There is hallux valgus with mild osteoarthritis of the first MTP joint. Soft tissues are unremarkable. There is peripheral vascular atherosclerotic disease.  IMPRESSION: No acute osseous injury of the right foot.   Electronically Signed   By: Elige KoHetal  Patel   On: 07/09/2013 12:18    Microbiology: Recent Results (from the past 240 hour(s))  MRSA PCR SCREENING     Status: None   Collection Time    07/09/13  6:44 PM      Result Value Ref Range Status   MRSA by PCR NEGATIVE  NEGATIVE Final   Comment:            The GeneXpert MRSA Assay (FDA     approved for NASAL specimens     only), is one component of a     comprehensive MRSA colonization     surveillance program. It is not     intended to diagnose MRSA     infection nor to guide or     monitor treatment for     MRSA infections.     Labs: Basic Metabolic Panel:  Recent Labs Lab 07/10/13 2308 07/11/13 0735 07/11/13 1440 07/12/13 1000 07/13/13 0402 07/14/13 0428  NA 136* 136*  --  135* 137 136*  K 4.5 4.4  --  5.0 4.6 5.4*  CL 99 99  --  100 104 103  CO2 21 22  --  18* 20 21  GLUCOSE 145* 158*  --  177* 154* 139*  BUN 44* 40*  --  43* 43* 41*  CREATININE 5.08* 4.91* 4.86* 4.90* 5.19* 4.96*  CALCIUM 8.7 8.6  --  8.9 8.3* 8.7   Liver Function Tests:  Recent Labs Lab 07/08/13 0007 07/09/13 1210  AST 13 16  ALT 7 8  ALKPHOS 47 58  BILITOT 0.3 0.2*  PROT 6.7 6.5  ALBUMIN 2.6* 2.5*    Recent Labs Lab 07/08/13 0007  LIPASE 26   No  results found for this basename: AMMONIA,  in the last 168 hours CBC:  Recent Labs Lab 07/08/13 0007 07/09/13 1210 07/10/13 0305 07/11/13 0359 07/11/13 1440 07/12/13 1000  WBC 13.2* 10.6* 8.8 9.0 8.0 8.9  NEUTROABS 10.6* 7.8*  --   --   --   --   HGB 10.0* 10.6* 9.8* 10.3* 11.5* 11.6*  HCT 29.3* 30.6* 28.7* 30.0* 32.2* 33.5*  MCV 88.8 87.2 88.0 86.7 86.1 87.0  PLT 296 312 314 326 319 367   Cardiac Enzymes:  Recent Labs Lab 07/08/13 0007  TROPONINI <0.30   BNP: BNP (last 3 results)  Recent Labs  10/13/12 0350  PROBNP 445.0*   CBG:  Recent Labs Lab 07/13/13 0620 07/13/13 1107 07/13/13 1620 07/13/13 2153 07/14/13 0613  GLUCAP 149* 206* 201* 145* 143*       Signed:  David StallFELIZ ORTIZ, Danah Reinecke  [facility] 07/14/2013, 9:11 AM

## 2013-07-14 NOTE — Progress Notes (Signed)
The patient had one N/V episode at 0200.  He was given Zofran with complete relief and also given a GI cocktail for indigestion and GERD.  He had pain medicine multiple times overnight for right toe pain and was able to sleep a large part of the night.

## 2013-07-16 ENCOUNTER — Other Ambulatory Visit: Payer: Self-pay

## 2013-07-16 ENCOUNTER — Encounter (HOSPITAL_COMMUNITY): Payer: Self-pay | Admitting: *Deleted

## 2013-07-16 MED ORDER — DEXTROSE 5 % IV SOLN
1.5000 g | INTRAVENOUS | Status: AC
  Administered 2013-07-17: 1.5 g via INTRAVENOUS
  Filled 2013-07-16: qty 1.5

## 2013-07-17 ENCOUNTER — Inpatient Hospital Stay (HOSPITAL_COMMUNITY): Payer: PRIVATE HEALTH INSURANCE | Admitting: Anesthesiology

## 2013-07-17 ENCOUNTER — Inpatient Hospital Stay (HOSPITAL_COMMUNITY): Payer: PRIVATE HEALTH INSURANCE

## 2013-07-17 ENCOUNTER — Encounter (HOSPITAL_COMMUNITY)
Admission: RE | Disposition: A | Discharge status: Home / Self Care | Payer: Self-pay | Source: Ambulatory Visit | Attending: Vascular Surgery

## 2013-07-17 ENCOUNTER — Encounter (HOSPITAL_COMMUNITY): Payer: PRIVATE HEALTH INSURANCE | Admitting: Anesthesiology

## 2013-07-17 ENCOUNTER — Encounter (HOSPITAL_COMMUNITY): Payer: Self-pay | Admitting: *Deleted

## 2013-07-17 ENCOUNTER — Inpatient Hospital Stay (HOSPITAL_COMMUNITY)
Admission: RE | Admit: 2013-07-17 | Discharge: 2013-07-23 | DRG: 238 | Disposition: A | Discharge status: Home / Self Care | Payer: PRIVATE HEALTH INSURANCE | Source: Ambulatory Visit | Attending: Vascular Surgery | Admitting: Vascular Surgery

## 2013-07-17 DIAGNOSIS — D62 Acute posthemorrhagic anemia: Secondary | ICD-10-CM | POA: Diagnosis not present

## 2013-07-17 DIAGNOSIS — I120 Hypertensive chronic kidney disease with stage 5 chronic kidney disease or end stage renal disease: Secondary | ICD-10-CM | POA: Diagnosis present

## 2013-07-17 DIAGNOSIS — I509 Heart failure, unspecified: Secondary | ICD-10-CM | POA: Diagnosis present

## 2013-07-17 DIAGNOSIS — F17200 Nicotine dependence, unspecified, uncomplicated: Secondary | ICD-10-CM | POA: Diagnosis present

## 2013-07-17 DIAGNOSIS — N185 Chronic kidney disease, stage 5: Secondary | ICD-10-CM | POA: Diagnosis present

## 2013-07-17 DIAGNOSIS — Z79899 Other long term (current) drug therapy: Secondary | ICD-10-CM

## 2013-07-17 DIAGNOSIS — I739 Peripheral vascular disease, unspecified: Secondary | ICD-10-CM | POA: Diagnosis present

## 2013-07-17 DIAGNOSIS — D638 Anemia in other chronic diseases classified elsewhere: Secondary | ICD-10-CM | POA: Diagnosis present

## 2013-07-17 DIAGNOSIS — E875 Hyperkalemia: Secondary | ICD-10-CM | POA: Diagnosis not present

## 2013-07-17 DIAGNOSIS — I2510 Atherosclerotic heart disease of native coronary artery without angina pectoris: Secondary | ICD-10-CM | POA: Diagnosis present

## 2013-07-17 DIAGNOSIS — I2589 Other forms of chronic ischemic heart disease: Secondary | ICD-10-CM | POA: Diagnosis present

## 2013-07-17 DIAGNOSIS — I70269 Atherosclerosis of native arteries of extremities with gangrene, unspecified extremity: Secondary | ICD-10-CM

## 2013-07-17 DIAGNOSIS — I252 Old myocardial infarction: Secondary | ICD-10-CM

## 2013-07-17 DIAGNOSIS — E782 Mixed hyperlipidemia: Secondary | ICD-10-CM | POA: Diagnosis present

## 2013-07-17 DIAGNOSIS — E1169 Type 2 diabetes mellitus with other specified complication: Secondary | ICD-10-CM | POA: Diagnosis present

## 2013-07-17 HISTORY — PX: ENDARTERECTOMY FEMORAL: SHX5804

## 2013-07-17 HISTORY — DX: Shortness of breath: R06.02

## 2013-07-17 HISTORY — PX: FEMORAL-POPLITEAL BYPASS GRAFT: SHX937

## 2013-07-17 HISTORY — PX: PATCH ANGIOPLASTY: SHX6230

## 2013-07-17 LAB — PROTIME-INR
INR: 1.07 (ref 0.00–1.49)
PROTHROMBIN TIME: 13.7 s (ref 11.6–15.2)

## 2013-07-17 LAB — ABO/RH: ABO/RH(D): A POS

## 2013-07-17 LAB — GLUCOSE, CAPILLARY
GLUCOSE-CAPILLARY: 129 mg/dL — AB (ref 70–99)
GLUCOSE-CAPILLARY: 81 mg/dL (ref 70–99)
GLUCOSE-CAPILLARY: 58 mg/dL — AB (ref 70–99)
Glucose-Capillary: 48 mg/dL — ABNORMAL LOW (ref 70–99)
Glucose-Capillary: 80 mg/dL (ref 70–99)
Glucose-Capillary: 81 mg/dL (ref 70–99)
Glucose-Capillary: 82 mg/dL (ref 70–99)
Glucose-Capillary: 91 mg/dL (ref 70–99)
Glucose-Capillary: 45 mg/dL — ABNORMAL LOW (ref 70–99)
Glucose-Capillary: 72 mg/dL (ref 70–99)
Glucose-Capillary: 59 mg/dL — ABNORMAL LOW (ref 70–99)
Glucose-Capillary: 90 mg/dL (ref 70–99)

## 2013-07-17 LAB — POCT I-STAT 4, (NA,K, GLUC, HGB,HCT)
Glucose, Bld: 48 mg/dL — ABNORMAL LOW (ref 70–99)
HCT: 31 % — ABNORMAL LOW (ref 39.0–52.0)
HEMOGLOBIN: 10.5 g/dL — AB (ref 13.0–17.0)
Potassium: 4.7 mEq/L (ref 3.7–5.3)
Sodium: 138 mEq/L (ref 137–147)

## 2013-07-17 LAB — PREPARE RBC (CROSSMATCH)

## 2013-07-17 SURGERY — BYPASS GRAFT FEMORAL-POPLITEAL ARTERY
Anesthesia: General | Site: Leg Upper | Laterality: Right

## 2013-07-17 MED ORDER — ACETAMINOPHEN 325 MG PO TABS: 325.0000 mg | ORAL_TABLET | ORAL | Status: DC | PRN

## 2013-07-17 MED ORDER — HEPARIN SODIUM (PORCINE) 1000 UNIT/ML IJ SOLN
INTRAMUSCULAR | Status: AC
  Filled 2013-07-17: qty 1

## 2013-07-17 MED ORDER — ASPIRIN 81 MG PO CHEW
81.0000 mg | CHEWABLE_TABLET | Freq: Every day | ORAL | Status: DC
  Administered 2013-07-17 – 2013-07-23 (×7): 81 mg via ORAL
  Filled 2013-07-17 (×8): qty 1

## 2013-07-17 MED ORDER — SUCCINYLCHOLINE CHLORIDE 20 MG/ML IJ SOLN
INTRAMUSCULAR | Status: DC | PRN
  Administered 2013-07-17: 120 mg via INTRAVENOUS

## 2013-07-17 MED ORDER — NEOSTIGMINE METHYLSULFATE 1 MG/ML IJ SOLN
INTRAMUSCULAR | Status: AC
  Filled 2013-07-17: qty 10

## 2013-07-17 MED ORDER — AMLODIPINE BESYLATE 5 MG PO TABS
5.0000 mg | ORAL_TABLET | Freq: Every day | ORAL | Status: DC
  Administered 2013-07-18 – 2013-07-23 (×6): 5 mg via ORAL
  Filled 2013-07-17 (×7): qty 1

## 2013-07-17 MED ORDER — CARVEDILOL 25 MG PO TABS
25.0000 mg | ORAL_TABLET | Freq: Two times a day (BID) | ORAL | Status: DC
  Administered 2013-07-18 – 2013-07-23 (×11): 25 mg via ORAL
  Filled 2013-07-17 (×13): qty 1

## 2013-07-17 MED ORDER — THROMBIN 20000 UNITS EX SOLR
CUTANEOUS | Status: AC
  Filled 2013-07-17: qty 20000

## 2013-07-17 MED ORDER — DEXTROSE 50 % IV SOLN: INTRAVENOUS | Status: DC | PRN

## 2013-07-17 MED ORDER — DEXTROSE 50 % IV SOLN
INTRAVENOUS | Status: DC | PRN
  Administered 2013-07-17: 12.5 g via INTRAVENOUS
  Administered 2013-07-17: 18.75 g via INTRAVENOUS
  Administered 2013-07-17 (×2): 12.5 g via INTRAVENOUS
  Administered 2013-07-17: 6.25 g via INTRAVENOUS
  Administered 2013-07-17 (×2): 12.5 g via INTRAVENOUS

## 2013-07-17 MED ORDER — DEXTROSE 50 % IV SOLN
50.0000 mL | Freq: Once | INTRAVENOUS | Status: AC
  Administered 2013-07-17: 50 mL via INTRAVENOUS
  Filled 2013-07-17: qty 50

## 2013-07-17 MED ORDER — METRONIDAZOLE 500 MG PO TABS
500.0000 mg | ORAL_TABLET | Freq: Three times a day (TID) | ORAL | Status: DC
  Administered 2013-07-17 – 2013-07-23 (×17): 500 mg via ORAL
  Filled 2013-07-17 (×19): qty 1

## 2013-07-17 MED ORDER — LIDOCAINE HCL (CARDIAC) 20 MG/ML IV SOLN
INTRAVENOUS | Status: AC
  Filled 2013-07-17: qty 5

## 2013-07-17 MED ORDER — SUCCINYLCHOLINE CHLORIDE 20 MG/ML IJ SOLN
INTRAMUSCULAR | Status: AC
  Filled 2013-07-17: qty 1

## 2013-07-17 MED ORDER — DEXTROSE 5 % IV SOLN
1.5000 g | Freq: Two times a day (BID) | INTRAVENOUS | Status: AC
  Administered 2013-07-17 – 2013-07-18 (×2): 1.5 g via INTRAVENOUS
  Filled 2013-07-17 (×2): qty 1.5

## 2013-07-17 MED ORDER — ONDANSETRON HCL 4 MG/2ML IJ SOLN
4.0000 mg | Freq: Four times a day (QID) | INTRAMUSCULAR | Status: DC | PRN
  Administered 2013-07-19 – 2013-07-21 (×2): 4 mg via INTRAVENOUS
  Filled 2013-07-17 (×3): qty 2

## 2013-07-17 MED ORDER — DIPHENHYDRAMINE HCL 50 MG/ML IJ SOLN
12.5000 mg | Freq: Four times a day (QID) | INTRAMUSCULAR | Status: DC | PRN
  Administered 2013-07-17: 12.5 mg via INTRAVENOUS
  Filled 2013-07-17: qty 0.25
  Filled 2013-07-17: qty 1

## 2013-07-17 MED ORDER — OXYCODONE HCL 5 MG PO TABS: 5.0000 mg | ORAL_TABLET | Freq: Once | ORAL | Status: DC | PRN

## 2013-07-17 MED ORDER — PROTAMINE SULFATE 10 MG/ML IV SOLN
INTRAVENOUS | Status: AC
  Filled 2013-07-17: qty 5

## 2013-07-17 MED ORDER — GLYCOPYRROLATE 0.2 MG/ML IJ SOLN
INTRAMUSCULAR | Status: DC | PRN
  Administered 2013-07-17: 0.6 mg via INTRAVENOUS

## 2013-07-17 MED ORDER — MORPHINE SULFATE (PF) 1 MG/ML IV SOLN
INTRAVENOUS | Status: DC
  Administered 2013-07-17: 9.82 mg via INTRAVENOUS
  Administered 2013-07-17 (×2): via INTRAVENOUS
  Administered 2013-07-17: 15 mg via INTRAVENOUS
  Administered 2013-07-18 (×2): via INTRAVENOUS
  Administered 2013-07-18: 31.5 mg via INTRAVENOUS
  Administered 2013-07-18: 13:00:00 via INTRAVENOUS
  Administered 2013-07-18: 9 mg via INTRAVENOUS
  Administered 2013-07-19 (×3): via INTRAVENOUS
  Administered 2013-07-19 (×2): 12 mg via INTRAVENOUS
  Administered 2013-07-19: 13.5 mg via INTRAVENOUS
  Administered 2013-07-20: 12 mg via INTRAVENOUS
  Administered 2013-07-20: 10.43 mL via INTRAVENOUS
  Administered 2013-07-20: 12:00:00 via INTRAVENOUS
  Administered 2013-07-20: 6 mg via INTRAVENOUS
  Administered 2013-07-20: 10.5 mL via INTRAVENOUS
  Administered 2013-07-21: 6 mg via INTRAVENOUS
  Administered 2013-07-21 (×2): via INTRAVENOUS
  Filled 2013-07-17 (×12): qty 25

## 2013-07-17 MED ORDER — PROPOFOL 10 MG/ML IV BOLUS
INTRAVENOUS | Status: AC
  Filled 2013-07-17: qty 20

## 2013-07-17 MED ORDER — POTASSIUM CHLORIDE CRYS ER 20 MEQ PO TBCR: 20.0000 meq | EXTENDED_RELEASE_TABLET | Freq: Every day | ORAL | Status: DC | PRN

## 2013-07-17 MED ORDER — SODIUM CHLORIDE 0.9 % IV SOLN
10.0000 mg | INTRAVENOUS | Status: DC | PRN
  Administered 2013-07-17: 10 ug/min via INTRAVENOUS

## 2013-07-17 MED ORDER — PHENYLEPHRINE 40 MCG/ML (10ML) SYRINGE FOR IV PUSH (FOR BLOOD PRESSURE SUPPORT)
PREFILLED_SYRINGE | INTRAVENOUS | Status: AC
  Filled 2013-07-17: qty 10

## 2013-07-17 MED ORDER — CIPROFLOXACIN HCL 500 MG PO TABS: 500.0000 mg | ORAL_TABLET | Freq: Two times a day (BID) | ORAL | Status: DC

## 2013-07-17 MED ORDER — DEXTROSE 50 % IV SOLN
INTRAVENOUS | Status: AC
  Administered 2013-07-17: 25 mL
  Filled 2013-07-17: qty 50

## 2013-07-17 MED ORDER — DOPAMINE-DEXTROSE 3.2-5 MG/ML-% IV SOLN: 3.0000 ug/kg/min | INTRAVENOUS | Status: DC

## 2013-07-17 MED ORDER — CHLORHEXIDINE GLUCONATE CLOTH 2 % EX PADS: 6.0000 | MEDICATED_PAD | Freq: Once | CUTANEOUS | Status: DC

## 2013-07-17 MED ORDER — DEXTROSE 50 % IV SOLN
INTRAVENOUS | Status: AC
  Filled 2013-07-17: qty 50

## 2013-07-17 MED ORDER — ALUM & MAG HYDROXIDE-SIMETH 200-200-20 MG/5ML PO SUSP: 15.0000 mL | ORAL | Status: DC | PRN

## 2013-07-17 MED ORDER — NEOSTIGMINE METHYLSULFATE 1 MG/ML IJ SOLN
INTRAMUSCULAR | Status: DC | PRN
  Administered 2013-07-17: 4 mg via INTRAVENOUS

## 2013-07-17 MED ORDER — ROCURONIUM BROMIDE 50 MG/5ML IV SOLN
INTRAVENOUS | Status: AC
  Filled 2013-07-17: qty 1

## 2013-07-17 MED ORDER — GUAIFENESIN-DM 100-10 MG/5ML PO SYRP: 15.0000 mL | ORAL_SOLUTION | ORAL | Status: DC | PRN

## 2013-07-17 MED ORDER — GLIPIZIDE 5 MG PO TABS
5.0000 mg | ORAL_TABLET | Freq: Every day | ORAL | Status: DC
  Filled 2013-07-17 (×2): qty 1

## 2013-07-17 MED ORDER — OMEGA-3 FATTY ACIDS 1000 MG PO CAPS: 1.0000 g | ORAL_CAPSULE | Freq: Every day | ORAL | Status: DC

## 2013-07-17 MED ORDER — MORPHINE SULFATE (PF) 1 MG/ML IV SOLN
INTRAVENOUS | Status: AC
  Filled 2013-07-17: qty 25

## 2013-07-17 MED ORDER — MIDAZOLAM HCL 2 MG/2ML IJ SOLN
INTRAMUSCULAR | Status: AC
  Filled 2013-07-17: qty 2

## 2013-07-17 MED ORDER — SODIUM CHLORIDE 0.9 % IJ SOLN
INTRAMUSCULAR | Status: AC
  Filled 2013-07-17: qty 10

## 2013-07-17 MED ORDER — EPHEDRINE SULFATE 50 MG/ML IJ SOLN
INTRAMUSCULAR | Status: AC
  Filled 2013-07-17: qty 1

## 2013-07-17 MED ORDER — SODIUM CHLORIDE 0.9 % IV SOLN: 500.0000 mL | Freq: Once | INTRAVENOUS | Status: AC | PRN

## 2013-07-17 MED ORDER — LACTATED RINGERS IV SOLN
INTRAVENOUS | Status: DC
  Administered 2013-07-17 (×2): via INTRAVENOUS

## 2013-07-17 MED ORDER — SODIUM CHLORIDE 0.9 % IV SOLN: INTRAVENOUS | Status: DC

## 2013-07-17 MED ORDER — PROMETHAZINE HCL 25 MG/ML IJ SOLN: 6.2500 mg | INTRAMUSCULAR | Status: DC | PRN

## 2013-07-17 MED ORDER — PROPOFOL 10 MG/ML IV BOLUS
INTRAVENOUS | Status: DC | PRN
  Administered 2013-07-17: 200 mg via INTRAVENOUS

## 2013-07-17 MED ORDER — FENTANYL CITRATE 0.05 MG/ML IJ SOLN
INTRAMUSCULAR | Status: AC
  Filled 2013-07-17: qty 5

## 2013-07-17 MED ORDER — PHENOL 1.4 % MT LIQD: 1.0000 | OROMUCOSAL | Status: DC | PRN

## 2013-07-17 MED ORDER — METOCLOPRAMIDE HCL 10 MG PO TABS
10.0000 mg | ORAL_TABLET | Freq: Four times a day (QID) | ORAL | Status: DC | PRN
  Filled 2013-07-17: qty 1

## 2013-07-17 MED ORDER — ROCURONIUM BROMIDE 100 MG/10ML IV SOLN
INTRAVENOUS | Status: DC | PRN
  Administered 2013-07-17: 50 mg via INTRAVENOUS
  Administered 2013-07-17 (×2): 20 mg via INTRAVENOUS
  Administered 2013-07-17: 10 mg via INTRAVENOUS

## 2013-07-17 MED ORDER — INSULIN ASPART 100 UNIT/ML ~~LOC~~ SOLN
0.0000 [IU] | Freq: Three times a day (TID) | SUBCUTANEOUS | Status: DC
  Administered 2013-07-20: 3 [IU] via SUBCUTANEOUS
  Administered 2013-07-20: 2 [IU] via SUBCUTANEOUS
  Administered 2013-07-21 – 2013-07-22 (×3): 3 [IU] via SUBCUTANEOUS
  Administered 2013-07-23: 2 [IU] via SUBCUTANEOUS

## 2013-07-17 MED ORDER — HYDROMORPHONE HCL PF 1 MG/ML IJ SOLN: 0.2500 mg | INTRAMUSCULAR | Status: DC | PRN

## 2013-07-17 MED ORDER — DOCUSATE SODIUM 100 MG PO CAPS
100.0000 mg | ORAL_CAPSULE | Freq: Every day | ORAL | Status: DC
  Administered 2013-07-18 – 2013-07-23 (×5): 100 mg via ORAL
  Filled 2013-07-17 (×6): qty 1

## 2013-07-17 MED ORDER — LABETALOL HCL 5 MG/ML IV SOLN
10.0000 mg | INTRAVENOUS | Status: DC | PRN
  Administered 2013-07-22: 10 mg via INTRAVENOUS
  Filled 2013-07-17: qty 4

## 2013-07-17 MED ORDER — ONDANSETRON HCL 4 MG PO TABS
4.0000 mg | ORAL_TABLET | Freq: Three times a day (TID) | ORAL | Status: DC | PRN
  Administered 2013-07-23: 4 mg via ORAL
  Filled 2013-07-17: qty 1

## 2013-07-17 MED ORDER — OXYCODONE HCL 5 MG/5ML PO SOLN: 5.0000 mg | Freq: Once | ORAL | Status: DC | PRN

## 2013-07-17 MED ORDER — NALOXONE HCL 0.4 MG/ML IJ SOLN
0.4000 mg | INTRAMUSCULAR | Status: DC | PRN
  Filled 2013-07-17: qty 1

## 2013-07-17 MED ORDER — PANTOPRAZOLE SODIUM 40 MG PO TBEC
40.0000 mg | DELAYED_RELEASE_TABLET | Freq: Every day | ORAL | Status: DC
  Administered 2013-07-17 – 2013-07-23 (×7): 40 mg via ORAL
  Filled 2013-07-17 (×5): qty 1

## 2013-07-17 MED ORDER — CHOLECALCIFEROL 10 MCG (400 UNIT) PO TABS
400.0000 [IU] | ORAL_TABLET | Freq: Every day | ORAL | Status: DC
  Administered 2013-07-17 – 2013-07-20 (×4): 400 [IU] via ORAL
  Filled 2013-07-17 (×5): qty 1

## 2013-07-17 MED ORDER — PHENYLEPHRINE HCL 10 MG/ML IJ SOLN
INTRAMUSCULAR | Status: DC | PRN
  Administered 2013-07-17: 80 ug via INTRAVENOUS

## 2013-07-17 MED ORDER — LIDOCAINE HCL (CARDIAC) 20 MG/ML IV SOLN
INTRAVENOUS | Status: DC | PRN
  Administered 2013-07-17: 100 mg via INTRAVENOUS

## 2013-07-17 MED ORDER — NITROGLYCERIN 0.4 MG SL SUBL
0.4000 mg | SUBLINGUAL_TABLET | SUBLINGUAL | Status: DC | PRN
Start: 2013-07-17 — End: 2013-07-23

## 2013-07-17 MED ORDER — SODIUM CHLORIDE 0.9 % IR SOLN
Status: DC | PRN
  Administered 2013-07-17: 09:00:00

## 2013-07-17 MED ORDER — ONDANSETRON HCL 4 MG/2ML IJ SOLN
INTRAMUSCULAR | Status: DC | PRN
  Administered 2013-07-17: 4 mg via INTRAVENOUS

## 2013-07-17 MED ORDER — METOPROLOL TARTRATE 1 MG/ML IV SOLN: 2.0000 mg | INTRAVENOUS | Status: DC | PRN

## 2013-07-17 MED ORDER — HYDRALAZINE HCL 20 MG/ML IJ SOLN: 10.0000 mg | INTRAMUSCULAR | Status: DC | PRN

## 2013-07-17 MED ORDER — SURGIFOAM 100 EX MISC
CUTANEOUS | Status: DC | PRN
  Administered 2013-07-17: 13:00:00 via TOPICAL

## 2013-07-17 MED ORDER — FENTANYL CITRATE 0.05 MG/ML IJ SOLN
INTRAMUSCULAR | Status: DC | PRN
  Administered 2013-07-17 (×3): 50 ug via INTRAVENOUS
  Administered 2013-07-17: 150 ug via INTRAVENOUS
  Administered 2013-07-17: 100 ug via INTRAVENOUS

## 2013-07-17 MED ORDER — 0.9 % SODIUM CHLORIDE (POUR BTL) OPTIME
TOPICAL | Status: DC | PRN
  Administered 2013-07-17: 2000 mL

## 2013-07-17 MED ORDER — OXYCODONE-ACETAMINOPHEN 5-325 MG PO TABS
2.0000 | ORAL_TABLET | Freq: Four times a day (QID) | ORAL | Status: DC | PRN
  Administered 2013-07-18 – 2013-07-23 (×10): 2 via ORAL
  Filled 2013-07-17 (×10): qty 2

## 2013-07-17 MED ORDER — SODIUM CHLORIDE 0.9 % IV SOLN
INTRAVENOUS | Status: DC
  Administered 2013-07-17 (×3): via INTRAVENOUS

## 2013-07-17 MED ORDER — DEXTROSE 50 % IV SOLN
INTRAVENOUS | Status: AC
  Administered 2013-07-17: 50 mL
  Filled 2013-07-17: qty 50

## 2013-07-17 MED ORDER — SODIUM CHLORIDE 0.9 % IJ SOLN: 9.0000 mL | INTRAMUSCULAR | Status: DC | PRN

## 2013-07-17 MED ORDER — DIPHENHYDRAMINE HCL 12.5 MG/5ML PO ELIX
12.5000 mg | ORAL_SOLUTION | Freq: Four times a day (QID) | ORAL | Status: DC | PRN
  Administered 2013-07-20: 12.5 mg via ORAL
  Filled 2013-07-17 (×2): qty 5

## 2013-07-17 MED ORDER — OMEGA-3-ACID ETHYL ESTERS 1 G PO CAPS
1.0000 g | ORAL_CAPSULE | Freq: Every day | ORAL | Status: DC
  Administered 2013-07-17 – 2013-07-23 (×7): 1 g via ORAL
  Filled 2013-07-17 (×7): qty 1

## 2013-07-17 MED ORDER — HEPARIN SODIUM (PORCINE) 1000 UNIT/ML IJ SOLN
INTRAMUSCULAR | Status: DC | PRN
  Administered 2013-07-17: 5000 [IU] via INTRAVENOUS
  Administered 2013-07-17: 10000 [IU] via INTRAVENOUS
  Administered 2013-07-17: 5000 [IU] via INTRAVENOUS

## 2013-07-17 MED ORDER — FUROSEMIDE 40 MG PO TABS
40.0000 mg | ORAL_TABLET | Freq: Two times a day (BID) | ORAL | Status: DC
  Administered 2013-07-17 – 2013-07-19 (×5): 40 mg via ORAL
  Filled 2013-07-17 (×8): qty 1

## 2013-07-17 MED ORDER — ACETAMINOPHEN 650 MG RE SUPP: 325.0000 mg | RECTAL | Status: DC | PRN

## 2013-07-17 MED ORDER — CIPROFLOXACIN HCL 500 MG PO TABS
500.0000 mg | ORAL_TABLET | Freq: Every day | ORAL | Status: DC
  Administered 2013-07-17 – 2013-07-23 (×7): 500 mg via ORAL
  Filled 2013-07-17 (×7): qty 1

## 2013-07-17 SURGICAL SUPPLY — 74 items
ADH SKN CLS APL DERMABOND .7 (GAUZE/BANDAGES/DRESSINGS) ×2
BAG ISL DRAPE 18X18 STRL (DRAPES) ×2
BAG ISOLATION DRAPE 18X18 (DRAPES) ×1 IMPLANT
BANDAGE ESMARK 6X9 LF (GAUZE/BANDAGES/DRESSINGS) IMPLANT
BNDG CMPR 9X6 STRL LF SNTH (GAUZE/BANDAGES/DRESSINGS)
BNDG ESMARK 6X9 LF (GAUZE/BANDAGES/DRESSINGS)
CANISTER SUCTION 2500CC (MISCELLANEOUS) ×3 IMPLANT
CANNULA VESSEL 3MM 2 BLNT TIP (CANNULA) ×2 IMPLANT
CLIP TI MEDIUM 24 (CLIP) ×3 IMPLANT
CLIP TI WIDE RED SMALL 24 (CLIP) ×4 IMPLANT
COVER SURGICAL LIGHT HANDLE (MISCELLANEOUS) ×3 IMPLANT
CUFF TOURNIQUET SINGLE 24IN (TOURNIQUET CUFF) IMPLANT
CUFF TOURNIQUET SINGLE 34IN LL (TOURNIQUET CUFF) IMPLANT
CUFF TOURNIQUET SINGLE 44IN (TOURNIQUET CUFF) IMPLANT
DERMABOND ADVANCED (GAUZE/BANDAGES/DRESSINGS) ×1
DERMABOND ADVANCED .7 DNX12 (GAUZE/BANDAGES/DRESSINGS) ×1 IMPLANT
DRAIN SNY WOU (WOUND CARE) IMPLANT
DRAPE ISOLATION BAG 18X18 (DRAPES) ×1
DRAPE WARM FLUID 44X44 (DRAPE) ×3 IMPLANT
DRAPE X-RAY CASS 24X20 (DRAPES) IMPLANT
DRSG COVADERM 4X10 (GAUZE/BANDAGES/DRESSINGS) ×4 IMPLANT
ELECT REM PT RETURN 9FT ADLT (ELECTROSURGICAL) ×3
ELECTRODE REM PT RTRN 9FT ADLT (ELECTROSURGICAL) ×2 IMPLANT
EVACUATOR SILICONE 100CC (DRAIN) IMPLANT
GLOVE BIO SURGEON STRL SZ 6.5 (GLOVE) ×4 IMPLANT
GLOVE BIO SURGEON STRL SZ7.5 (GLOVE) ×3 IMPLANT
GLOVE BIOGEL PI IND STRL 6.5 (GLOVE) IMPLANT
GLOVE BIOGEL PI IND STRL 7.0 (GLOVE) IMPLANT
GLOVE BIOGEL PI IND STRL 7.5 (GLOVE) IMPLANT
GLOVE BIOGEL PI INDICATOR 6.5 (GLOVE) ×2
GLOVE BIOGEL PI INDICATOR 7.0 (GLOVE) ×4
GLOVE BIOGEL PI INDICATOR 7.5 (GLOVE) ×1
GLOVE SS BIOGEL STRL SZ 7 (GLOVE) ×1 IMPLANT
GLOVE SS N UNI LF 6.5 STRL (GLOVE) ×4 IMPLANT
GLOVE SS N UNI LF 7.0 STRL (GLOVE) ×2 IMPLANT
GLOVE SUPERSENSE BIOGEL SZ 7 (GLOVE) ×1
GLOVE SURG SS PI 7.0 STRL IVOR (GLOVE) ×1 IMPLANT
GOWN STRL REUS W/ TWL LRG LVL3 (GOWN DISPOSABLE) ×7 IMPLANT
GOWN STRL REUS W/ TWL XL LVL3 (GOWN DISPOSABLE) IMPLANT
GOWN STRL REUS W/TWL LRG LVL3 (GOWN DISPOSABLE) ×12
GOWN STRL REUS W/TWL XL LVL3 (GOWN DISPOSABLE) ×12
GRAFT PROPATEN W/RING 6X80X60 (Vascular Products) ×1 IMPLANT
KIT BASIN OR (CUSTOM PROCEDURE TRAY) ×3 IMPLANT
KIT ROOM TURNOVER OR (KITS) ×3 IMPLANT
LOOP VESSEL MINI RED (MISCELLANEOUS) ×1 IMPLANT
NS IRRIG 1000ML POUR BTL (IV SOLUTION) ×6 IMPLANT
PACK PERIPHERAL VASCULAR (CUSTOM PROCEDURE TRAY) ×3 IMPLANT
PAD ARMBOARD 7.5X6 YLW CONV (MISCELLANEOUS) ×6 IMPLANT
PADDING CAST COTTON 6X4 STRL (CAST SUPPLIES) IMPLANT
SET COLLECT BLD 21X3/4 12 (NEEDLE) IMPLANT
SPONGE SURGIFOAM ABS GEL 100 (HEMOSTASIS) IMPLANT
STAPLER VISISTAT 35W (STAPLE) ×1 IMPLANT
STOPCOCK 4 WAY LG BORE MALE ST (IV SETS) IMPLANT
SUT PROLENE 5 0 C 1 24 (SUTURE) ×3 IMPLANT
SUT PROLENE 6 0 C 1 24 (SUTURE) ×1 IMPLANT
SUT PROLENE 6 0 CC (SUTURE) ×6 IMPLANT
SUT PROLENE 7 0 BV 1 (SUTURE) IMPLANT
SUT PROLENE 7 0 BV1 MDA (SUTURE) IMPLANT
SUT SILK 2 0 FS (SUTURE) ×2 IMPLANT
SUT SILK 2 0 SH (SUTURE) ×3 IMPLANT
SUT SILK 3 0 (SUTURE) ×6
SUT SILK 3-0 18XBRD TIE 12 (SUTURE) IMPLANT
SUT VIC AB 2-0 CTX 36 (SUTURE) ×6 IMPLANT
SUT VIC AB 3-0 SH 27 (SUTURE) ×18
SUT VIC AB 3-0 SH 27X BRD (SUTURE) ×4 IMPLANT
SUT VIC AB 4-0 PS2 27 (SUTURE) ×4 IMPLANT
SYR 20CC LL (SYRINGE) ×1 IMPLANT
TAPE UMBILICAL COTTON 1/8X30 (MISCELLANEOUS) IMPLANT
TOWEL OR 17X24 6PK STRL BLUE (TOWEL DISPOSABLE) ×6 IMPLANT
TOWEL OR 17X26 10 PK STRL BLUE (TOWEL DISPOSABLE) ×6 IMPLANT
TRAY FOLEY CATH 16FRSI W/METER (SET/KITS/TRAYS/PACK) ×3 IMPLANT
TUBING EXTENTION W/L.L. (IV SETS) IMPLANT
UNDERPAD 30X30 INCONTINENT (UNDERPADS AND DIAPERS) ×3 IMPLANT
WATER STERILE IRR 1000ML POUR (IV SOLUTION) ×3 IMPLANT

## 2013-07-17 NOTE — Preoperative (Signed)
Beta Blockers   Reason not to administer Beta Blockers:Not Applicable 

## 2013-07-17 NOTE — Progress Notes (Signed)
Pt arrived from PACU, arousable, oriented x4, VSS, marked pulses dopplerable. Will continue to monitor.

## 2013-07-17 NOTE — Transfer of Care (Signed)
Immediate Anesthesia Transfer of Care Note  Patient: [name]  Procedure(s) Performed: Procedure(s): BYPASS GRAFT FEMORAL-POPLITEAL ARTERY-RIGHT (Right) ENDARTERECTOMY FEMORAL WITH PROFUNDAPLOASTY (Right) PATCH ANGIOPLASTY OF COMMON FEMORAL AND PROFUNDA USING SEGMENT OF GREATER SAPHENOUS VEIN  (Right)  Patient Location: PACU  Anesthesia Type:General  Level of Consciousness: sedated  Airway & Oxygen Therapy: Patient Spontanous Breathing and Patient connected to face mask oxygen  Post-op Assessment: Report given to PACU RN and Post -op Vital signs reviewed and stable  Post vital signs: Reviewed and stable  Complications: No apparent anesthesia complications

## 2013-07-17 NOTE — Op Note (Signed)
Procedure: Right femoral to below knee popliteal bypass with Propaten, common femoral and external iliac endarterectomy, extended profundaplasty  Preoperative diagnosis: Gangrene right first toe  Postoperative diagnosis: Same  Anesthesia: General  Asst.: Doreatha Massed, PA-C  Operative findings:     Calcified vessels, poor quality saphenous vein               6 mm ring supported PTFE, Propaten  Operative details: After obtaining informed consent, the patient was taken to the operating room. The patient was placed in supine position on the operating room table. After induction of general anesthesia and endotracheal intubation, a Foley catheter was placed. Next, the patient's entire right lower extremity was prepped and draped in the usual sterile fashion.  A longitudinal incision was then made in the left groin and carried down through the subcutaneous tissues to expose the left common femoral artery. The common femoral artery was dissected free circumferentially. There was a pulse within the common femoral artery. The distal external iliac artery was dissected free circumferentially underneath the inguinal ligament.  A vessel loop was also placed around the distal external iliac artery. The profunda femoris and superficial femoral arteries were also dissected free circumferentially as well and vessel loops placed around these.  There was some calcification in the femoral artery as well but with some soft areas.  Next the saphenofemoral junction was identified in the medial portion of the groin incision and this was harvested through several skip incisions on the medial aspect of the leg.  Side branches were ligated and divided between silk ties or clips.  The vein was of good quality proximally 3 mm diameter but had an early bifurcation and only about 7 cm of usable vein harvested.  The saphenofemoral junction was ligated with a 2 0 silk tie and the distal end of the vein ligated and the vein examined  and flushed thoroughly with heparinized saline.  The vein was then placed on the back table in case a patch was needed.   An incision was made on the medial aspect of the leg below the knee.  The saphenous vein was located in this in incision and less than 2 mm diameter and unusable.  The incision was deepened and the below knee popliteal space was entered.  The popliteal artery was dissected free circumferentially.  It was calcified in islands and thickened but was clampable.    A tunnel was then created between the heads of the gastrocnemius muscle subsartorial up to the groin.  The patient was given 10000 units of heparin.  After appropriate circulation time, the distal left external iliac artery was controlled with a small Cooley clamp. The profunda was controlled with a vessel loop and the SFA controlled with a fistula clamp.   A longitudinal opening was made in the common femoral artery on its anterior surface.  The vessel was calcified and thickened and unsewable in its condition due to significant plaque.  I proceeded to do a femoral endarterectomy of the common femoral artery from the inguinal ligament and up into the external iliac artery above the circumflex iliac branches then to the femoral bifurcation.  A good endpoint was obtained proximally but distally the profunda was very diseased.  The arteriotomy was opened several more centimeters to the tertiary branches of the profunda to get a reasonable endpoint.  A vein patch was then constructed by opening a piece of the saphenous vein longitudinally.  This was then sewn on as patch angioplasty using a  running 6 0 prolene suture.  A completion a longitudinal opening was made in the vein patch for the proximal anastomosis.  A 6 mm ring supported Propaten graft was then brought up on the operative field and spatulated.  The graft was then sewn end of graft to side of artery using a running 5 0 Prolene.  Proximal clamp and distal clamps were removed and  there was good pulsatile flow in the profunda femoris and superficial femoral artery immediately.    The graft was then brought through the subsartorial tunnel down to the below-knee popliteal artery after marking for orientation. The below-knee popliteal artery was controlled proximally with a vessel loop and distally with a peripheral Debakey clamp. A longitudinal opening was made in the distal below-knee popliteal artery in an area that was fairly free of calcification. The graft was then cut to length and spatulated and sewn end of graft to side of artery using running 6-0 Prolene suture.  At completion of the anastomosis everything was forebled backbled and thoroughly flushed. The remainder of the anastomosis was completed and all clamps were removed restoring pulsatile flow to the below-knee popliteal artery.  An arteriogram was not performed due to the patients low GFR putting him at risk for renal dysfunction.  There was monophasic to biphasic Doppler flow in the posterior tibial area of the foot.  The patient had been given an additional [id number] units of heparin during the case.  The heparin was fully reversed with protamine.  Each anastomosis was then packed with thrombin and gelfoam until hemostasis was obtained.  The wounds were then irrigated and the gelfoam removed.  After hemostasis was obtained, the deep layers and subcutaneous layers of the below-knee popliteal incision were closed with running 3-0 Vicryl suture. The skin was closed with staples.   The saphenectomy incisions were closed with running 3 0 vicryl follow by staples.  The groin was inspected and found to be hemostatic. This was then closed in multiple layers of running 2 0 and 3-0 Vicryl suture and 4-0 subcuticular stitch. Dermabond was applied to the groin incision.  The patient tolerated the procedure well and there were no complications. Instrument sponge and needle counts correct in the case. Patient was taken to the recovery in  stable condition.  Fabienne Brunsharles Sieanna Vanstone, MD [facility] [place] Office: [phone] Pager: [phone]

## 2013-07-17 NOTE — Anesthesia Preprocedure Evaluation (Addendum)
Anesthesia Evaluation  Patient identified by MRN, date of birth, ID band Patient awake    Reviewed: Allergy & Precautions, H&P , NPO status , Patient's Chart, lab work & pertinent test results  Airway Mallampati: I  Neck ROM: Full    Dental  (+) Partial Upper, Partial Lower, Poor Dentition   Pulmonary shortness of breath, former smoker,  breath sounds clear to auscultation        Cardiovascular hypertension, + CAD, + Past MI, + Peripheral Vascular Disease and +CHF + dysrhythmias Rhythm:Regular Rate:Normal     Neuro/Psych    GI/Hepatic   Endo/Other  diabetes  Renal/GU ESRFRenal disease     Musculoskeletal   Abdominal (+) + obese,   Peds  Hematology   Anesthesia Other Findings   Reproductive/Obstetrics                         Anesthesia Physical Anesthesia Plan  ASA: IV  Anesthesia Plan: General   Post-op Pain Management:    Induction: Intravenous  Airway Management Planned: Oral ETT  Additional Equipment: Arterial line  Intra-op Plan:   Post-operative Plan: Extubation in OR  Informed Consent: I have reviewed the patients History and Physical, chart, labs and discussed the procedure including the risks, benefits and alternatives for the proposed anesthesia with the patient or authorized representative who has indicated his/her understanding and acceptance.   Dental advisory given  Plan Discussed with: CRNA and Surgeon  Anesthesia Plan Comments:        Anesthesia Quick Evaluation

## 2013-07-17 NOTE — Interval H&P Note (Signed)
History and Physical Interval Note:  07/17/2013 7:23 AM  [name]  has presented today for surgery, with the diagnosis of Peripheral Arterial Disease Right Foot Ischemia;INSTAGE OF RENAL DISEASE  The various methods of treatment have been discussed with the patient and family. After consideration of risks, benefits and other options for treatment, the patient has consented to  Procedure(s): BYPASS GRAFT FEMORAL-POPLITEAL ARTERY-RIGHT (Right) as a surgical intervention .  The patient's history has been reviewed, patient examined, no change in status, stable for surgery.  I have reviewed the patient's chart and labs.  Questions were answered to the patient's satisfaction.     [name] E

## 2013-07-17 NOTE — H&P (View-Only) (Signed)
   Filed Vitals:   07/12/13 2133 07/13/13 0408 07/13/13 0412 07/13/13 1006  BP: 139/71  125/70 142/67  Pulse: 77  77 79  Temp: 97.4 F (36.3 C)  97.5 F (36.4 C)   TempSrc: Oral  Oral   Resp: 18  19   Height:      Weight:  222 lb 11.2 oz (101.016 kg)    SpO2: 99%  99%    No real change in toe Angiogram reviewed  BMET    Component Value Date/Time   NA 137 07/13/2013 0402   K 4.6 07/13/2013 0402   CL 104 07/13/2013 0402   CO2 20 07/13/2013 0402   GLUCOSE 154* 07/13/2013 0402   BUN 43* 07/13/2013 0402   CREATININE 5.19* 07/13/2013 0402   CALCIUM 8.3* 07/13/2013 0402   GFRNONAA 12* 07/13/2013 0402   GFRAA 14* 07/13/2013 0402      Will plan for right fem pop next week.  Will place Diatek catheter at that time as well  Procedure risk benefit discussed with pt.  Will get vein mapping of legs preop.  Marja Adderley, MD [facility] [place] Office: [phone] Pager: [phone]  

## 2013-07-17 NOTE — Anesthesia Postprocedure Evaluation (Signed)
  Anesthesia Post-op Note  Patient: [name]  Procedure(s) Performed: Procedure(s): BYPASS GRAFT FEMORAL-POPLITEAL ARTERY-RIGHT (Right) ENDARTERECTOMY FEMORAL WITH PROFUNDAPLOASTY (Right) PATCH ANGIOPLASTY OF COMMON FEMORAL AND PROFUNDA USING SEGMENT OF GREATER SAPHENOUS VEIN  (Right)  Patient Location: PACU  Anesthesia Type:General  Level of Consciousness: awake and alert   Airway and Oxygen Therapy: Patient Spontanous Breathing  Post-op Pain: mild  Post-op Assessment: Post-op Vital signs reviewed, Patient's Cardiovascular Status Stable and Respiratory Function Stable  Post-op Vital Signs: Reviewed  Filed Vitals:   07/17/13 1504  BP: 128/86  Pulse:   Temp: 36.3 C  Resp: 15    Complications: No apparent anesthesia complications

## 2013-07-18 LAB — GLUCOSE, CAPILLARY
GLUCOSE-CAPILLARY: 58 mg/dL — AB (ref 70–99)
GLUCOSE-CAPILLARY: 111 mg/dL — AB (ref 70–99)
GLUCOSE-CAPILLARY: 49 mg/dL — AB (ref 70–99)
GLUCOSE-CAPILLARY: 102 mg/dL — AB (ref 70–99)
GLUCOSE-CAPILLARY: 92 mg/dL (ref 70–99)
Glucose-Capillary: 59 mg/dL — ABNORMAL LOW (ref 70–99)
Glucose-Capillary: 46 mg/dL — ABNORMAL LOW (ref 70–99)
Glucose-Capillary: 64 mg/dL — ABNORMAL LOW (ref 70–99)
Glucose-Capillary: 81 mg/dL (ref 70–99)
Glucose-Capillary: 141 mg/dL — ABNORMAL HIGH (ref 70–99)

## 2013-07-18 LAB — FERRITIN: Ferritin: 238 ng/mL (ref 22–322)

## 2013-07-18 LAB — BASIC METABOLIC PANEL
BUN: 52 mg/dL — ABNORMAL HIGH (ref 6–23)
CHLORIDE: 105 meq/L (ref 96–112)
CO2: 22 mEq/L (ref 19–32)
Calcium: 8.2 mg/dL — ABNORMAL LOW (ref 8.4–10.5)
Creatinine, Ser: 6.16 mg/dL — ABNORMAL HIGH (ref 0.50–1.35)
GFR calc non Af Amer: 10 mL/min — ABNORMAL LOW (ref >90)
GFR, EST AFRICAN AMERICAN: 12 mL/min — AB (ref >90)
Glucose, Bld: 104 mg/dL — ABNORMAL HIGH (ref 70–99)
POTASSIUM: 5.4 meq/L — AB (ref 3.7–5.3)
SODIUM: 137 meq/L (ref 137–147)

## 2013-07-18 LAB — IRON AND TIBC
Iron: 20 ug/dL — ABNORMAL LOW (ref 42–135)
Saturation Ratios: 11 % — ABNORMAL LOW (ref 20–55)
TIBC: 180 ug/dL — ABNORMAL LOW (ref 215–435)
UIBC: 160 ug/dL (ref 125–400)

## 2013-07-18 LAB — HEMOGLOBIN A1C
Hgb A1c MFr Bld: 8.3 % — ABNORMAL HIGH (ref <5.7)
MEAN PLASMA GLUCOSE: 192 mg/dL — AB (ref <117)

## 2013-07-18 LAB — CBC
HCT: 24.9 % — ABNORMAL LOW (ref 39.0–52.0)
Hemoglobin: 8.3 g/dL — ABNORMAL LOW (ref 13.0–17.0)
MCH: 29.7 pg (ref 26.0–34.0)
MCHC: 33.3 g/dL (ref 30.0–36.0)
MCV: 89.2 fL (ref 78.0–100.0)
PLATELETS: 382 10*3/uL (ref 150–400)
RBC: 2.79 MIL/uL — ABNORMAL LOW (ref 4.22–5.81)
RDW: 12.8 % (ref 11.5–15.5)
WBC: 10.9 10*3/uL — AB (ref 4.0–10.5)

## 2013-07-18 MED ORDER — HEPARIN SODIUM (PORCINE) 5000 UNIT/ML IJ SOLN
5000.0000 [IU] | Freq: Three times a day (TID) | INTRAMUSCULAR | Status: DC
  Administered 2013-07-18 – 2013-07-23 (×15): 5000 [IU] via SUBCUTANEOUS
  Filled 2013-07-18 (×20): qty 1

## 2013-07-18 MED ORDER — DEXTROSE 50 % IV SOLN
INTRAVENOUS | Status: AC
  Administered 2013-07-18: 50 mL via INTRAVENOUS
  Filled 2013-07-18: qty 50

## 2013-07-18 MED ORDER — GLUCOSE-VITAMIN C 4-6 GM-MG PO CHEW
4.0000 | CHEWABLE_TABLET | ORAL | Status: DC | PRN
  Administered 2013-07-18 (×2): 4 via ORAL

## 2013-07-18 MED ORDER — GLUCOSE-VITAMIN C 4-6 GM-MG PO CHEW
CHEWABLE_TABLET | ORAL | Status: AC
  Filled 2013-07-18: qty 1

## 2013-07-18 MED ORDER — DEXTROSE 50 % IV SOLN: 50.0000 mL | Freq: Once | INTRAVENOUS | Status: AC | PRN

## 2013-07-18 NOTE — Progress Notes (Signed)
Inpatient Diabetes Program Recommendations  AACE/ADA: New Consensus Statement on Inpatient Glycemic Control (2013)  Target Ranges:  Prepandial:   less than 140 mg/dL      Peak postprandial:   less than 180 mg/dL (1-2 hours)      Critically ill patients:  140 - 180 mg/dL   Reason for Visit: Note Hypoglycemia with oral Glipizide.  Patient currently not on Glipizide.   CBG's remain low.  Called and discussed with RN.  Instructed her to call MD if CBG's remain less than 80 mg/dL.  May need dextrose added to IV fluids?  Note that glipizide can "hang on", especially with low renal function.   Also may consider holding Novolog correction until CBG's consistently greater than 150 mg/dL.  Once Novolog correction restarted, may consider sensitive.  Will follow. Beryl Meager, RN, BC-ADM Inpatient Diabetes Coordinator Pager [phone]

## 2013-07-18 NOTE — Progress Notes (Signed)
Hypoglycemic Event  CBG: 56  Treatment: D50 IV 50 mL and 15gm CHO snack  Symptoms: None  Follow-up CBG: Time:2252 CBG Result:90  Possible Reasons for Event: Inadequate meal intake   Windell Moment, RN    Remember to initiate Hypoglycemia Order Set & complete

## 2013-07-18 NOTE — Progress Notes (Signed)
Hypoglycemic Event  CBG: 58  Treatment: 4 glucose tabs  Symptoms: None  Follow-up CBG: Time:0430 CBG Result:54  Possible Reasons for Event: Inadequate meal intake   Windell Moment, RN  Remember to initiate Hypoglycemia Order Set & complete

## 2013-07-18 NOTE — Progress Notes (Addendum)
[facility] Progress Note  07/18/2013 7:24 AM 1 Day Post-Op  Subjective:  States he has pain, but controlled with PCA; foot has feeling in it  Afebrile VSS 98% RA  Filed Vitals:   07/18/13 0335  BP: 113/53  Pulse: 93  Temp: 98.9 F (37.2 C)  Resp: 12    Physical Exam: Incisions:  Covered with bandage in place-clean and dry Extremities:  + audible doppler in the PT/DP on the right; right foot warm  CBC    Component Value Date/Time   WBC 10.9* 07/18/2013 0500   RBC 2.79* 07/18/2013 0500   HGB 8.3* 07/18/2013 0500   HCT 24.9* 07/18/2013 0500   PLT 382 07/18/2013 0500   MCV 89.2 07/18/2013 0500   MCH 29.7 07/18/2013 0500   MCHC 33.3 07/18/2013 0500   RDW 12.8 07/18/2013 0500   LYMPHSABS 2.0 07/09/2013 1210   MONOABS 0.7 07/09/2013 1210   EOSABS 0.1 07/09/2013 1210   BASOSABS 0.0 07/09/2013 1210    BMET    Component Value Date/Time   NA 137 07/18/2013 0500   K 5.4* 07/18/2013 0500   CL 105 07/18/2013 0500   CO2 22 07/18/2013 0500   GLUCOSE 104* 07/18/2013 0500   BUN 52* 07/18/2013 0500   CREATININE 6.16* 07/18/2013 0500   CALCIUM 8.2* 07/18/2013 0500   GFRNONAA 10* 07/18/2013 0500   GFRAA 12* 07/18/2013 0500    INR    Component Value Date/Time   INR 1.07 07/17/2013 0610     Intake/Output Summary (Last 24 hours) at 07/18/13 0724 Last data filed at 07/18/13 0500  Gross per 24 hour  Intake   3325 ml  Output   1370 ml  Net   1955 ml     Assessment:  43 y.o. male is s/p:  Right femoral to below knee popliteal bypass with Propaten, common femoral and external iliac endarterectomy, extended profundaplasty  1 Day Post-Op  Plan: -pt with audible right PT/DP-will get ABI's today -DVT prophylaxis:  Start heparin SQ since pt has worsening renal function -hypoglycemic-will discontinue glipizide at this time.  Will get DM coordinator to see pt. -acute surgical blood loss anemia-tolerating -worsening renal function today.  Will get renal consult. -foley out  earlier this am-pt has not voided yet -increase mobilization -transfer to 2W -wean PCA -explained importance of smoking cessation and pt expresses understanding. -pt on Cipro for infection right great toe    Doreatha Massed, PA-C [facility] [phone] 07/18/2013 7:24 AM  Addendum  I have independently interviewed and examined the patient, and I agree with the physician assistant's findings.  Lower BG this AM, took his Glipizide yesterday.  Ok to transfer once BG stabilizes.  ABI pending  Leonides Sake, MD [facility] [place] Office: [phone] Pager: [phone]  07/18/2013, 8:43 AM

## 2013-07-18 NOTE — Progress Notes (Signed)
Hypoglycemic Event  CBG: 58  Treatment: 15 GM carbohydrate snack  Symptoms: None  Follow-up CBG: Time:2200 CBG Result:56  Possible Reasons for Event: Inadequate meal intake    Windell Moment, RN  Remember to initiate Hypoglycemia Order Set & complete

## 2013-07-18 NOTE — Progress Notes (Addendum)
Attempted to call report to [place], [place] 2W. Unable to take report at this time, will continue to monitor patient. 2W will call back. Pt sleeping in chair, family at bedside.  1143 Report given to Asher Muir, Charity fundraiser. Will recheck BG prior to tx, if lower than 80 will call Vascular to get order for IVF with dextrose per DM educator. Weaning morphine PCA, pt tolerating PO percocet well. Family at bedside. eICU and CCMT notified of transfer. Pt transferred via wheelchair by NT. Will continue to monitor until time of transfer. Transferred at 1220. BG at time of transfer 101.

## 2013-07-18 NOTE — Evaluation (Signed)
Physical Therapy Evaluation Patient Details Name: [name] MRN: [id number] DOB: June 04, 1970 Today's Date: 07/18/2013 Time: [id number] PT Time Calculation (min): 29 min  PT Assessment / Plan / Recommendation History of Present Illness  Pt with gangrene to R big toe. pt s/p Right femoral to below knee popliteal bypass .  Clinical Impression  Pt with R toe pain limiting WBing tolerance on [name] during ambulation. Pt currently requires use of RW for safe ambulation. Recommend 24/7 assist for safe d/c home. Pt to con't to benefit from acute skilled PT to progress mobility for safe d/c home.    PT Assessment  Patient needs continued PT services    Follow Up Recommendations  No PT follow up;Supervision/Assistance - 24 hour    Does the patient have the potential to tolerate intense rehabilitation      Barriers to Discharge   unclear if patient with have assistance during the day. Pt plans on talking to his sister.    Equipment Recommendations  Rolling walker with 5" wheels    Recommendations for Other Services     Frequency Min 3X/week    Precautions / Restrictions Precautions Precautions: Fall Precaution Comments: self limiting [name] WBing Restrictions Weight Bearing Restrictions: No Other Position/Activity Restrictions: pt self NWB [name] due to pain   Pertinent Vitals/Pain 8/10 R foot, pt hit morphine pain pump x2 during PT session      Mobility  Bed Mobility Overal bed mobility:  (pt up in chair upon PT arrival) Transfers Overall transfer level: Needs assistance Equipment used: Rolling walker (2 wheeled) Transfers: Sit to/from Stand Sit to Stand: Min assist General transfer comment: max directional v/c's for safe hand placement and [name] management Ambulation/Gait Ambulation/Gait assistance: Min assist Ambulation Distance (Feet): 60 Feet Assistive device: Rolling walker (2 wheeled) Gait Pattern/deviations: Step-to pattern;Decreased stance time - right Gait velocity:  slow General Gait Details: Pt v/c's to attempt to WB thru R heal. v/c's for walker management    Exercises General Exercises - Lower Extremity Ankle Circles/Pumps: AROM;Both;10 reps Long Arc Quad: AROM;Right;10 reps;Seated   PT Diagnosis: Difficulty walking;Acute pain  PT Problem List: Decreased strength;Decreased activity tolerance;Decreased balance;Decreased mobility;Decreased knowledge of use of DME;Pain PT Treatment Interventions: DME instruction;Gait training;Functional mobility training;Therapeutic activities;Therapeutic exercise     PT Goals(Current goals can be found in the care plan section) Acute Rehab PT Goals Patient Stated Goal: home PT Goal Formulation: With patient Time For Goal Achievement: 07/25/13 Potential to Achieve Goals: Good  Visit Information  Last PT Received On: 07/18/13 Assistance Needed: +1 History of Present Illness: Pt with gangrene to R big toe. pt s/p Right femoral to below knee popliteal bypass .       Prior Functioning  Home Living Family/patient expects to be discharged to:: Private residence Living Arrangements: Spouse/significant other Available Help at Discharge: Family;Available PRN/intermittently (wife works during the day) Type of Home: House Home Access: Level entry Home Layout: One level Home Equipment: None Additional Comments: sister could potentially help out Prior Function Level of Independence: Independent Communication Communication: No difficulties Dominant Hand: Right    Cognition  Cognition Arousal/Alertness: Awake/alert Behavior During Therapy: WFL for tasks assessed/performed Overall Cognitive Status: Within Functional Limits for tasks assessed    Extremity/Trunk Assessment Upper Extremity Assessment Upper Extremity Assessment: Overall WFL for tasks assessed Lower Extremity Assessment Lower Extremity Assessment: RLE deficits/detail RLE Deficits / Details: ROM/MMT limited by pain. pt with minimal R ankle ROM but  able to intiiate Cervical / Trunk Assessment Cervical /  Trunk Assessment: Normal   Balance Balance Overall balance assessment: Needs assistance Standing balance support: Bilateral upper extremity supported Standing balance-Leahy Scale: Poor Standing balance comment: due to [name] pain pt requires support of Bilat UEs  End of Session PT - End of Session Equipment Utilized During Treatment: Gait belt Activity Tolerance: Patient limited by pain Patient left: in chair;with call bell/phone within reach;with family/visitor present Nurse Communication: Mobility status (amb patient 2 more times today)  GP     Marcene Brawn 07/18/2013, 10:10 AM  Lewis Shock, PT, DPT Pager #: [phone] Office #: [phone]

## 2013-07-18 NOTE — Progress Notes (Signed)
Hypoglycemic Event  CBG: 49  Treatment: 4 Glucose tabs per scanning order. And 15 GM snack including peanut butter. Pt has had low/labile BG since OR 3/10. Attempting to increase protein intake to sustain BG.   Symptoms: sleepy  Follow-up CBG: UXNA:[id number] CBG Result:81  Possible Reasons for Event: Medication regimen: Pt had PO glipized AM of 3/10 with poor oral intake r/t surgery.   Comments/MD notified:Chen in person.     Ozella Rocks P  Remember to initiate Hypoglycemia Order Set & complete

## 2013-07-18 NOTE — Progress Notes (Signed)
Pt transferred to 2w12 from 3s; pt rating pain 10/10 upon arrival to room; orders for PCA morphine; PCA syringe completely empty upon pt's arrival to unit and PCA not connected to pt; new syringe set up at this time; pt encouraged to use for pain control; will cont. To monitor.

## 2013-07-18 NOTE — Anesthesia Postprocedure Evaluation (Signed)
  Anesthesia Post-op Note  Patient: [name]  Procedure(s) Performed: Procedure(s): BYPASS GRAFT FEMORAL-POPLITEAL ARTERY-RIGHT (Right) ENDARTERECTOMY FEMORAL WITH PROFUNDAPLOASTY (Right) PATCH ANGIOPLASTY OF COMMON FEMORAL AND PROFUNDA USING SEGMENT OF GREATER SAPHENOUS VEIN  (Right)  Patient Location: PACU  Anesthesia Type:General  Level of Consciousness: awake, alert  and oriented  Airway and Oxygen Therapy: Patient Spontanous Breathing  Post-op Pain: moderate  Post-op Assessment: Post-op Vital signs reviewed, Patient's Cardiovascular Status Stable, Respiratory Function Stable, Patent Airway, No signs of Nausea or vomiting and Pain level not controlled  Post-op Vital Signs: Reviewed and stable  Complications: No apparent anesthesia complications

## 2013-07-18 NOTE — Progress Notes (Signed)
Hypoglycemic Event  CBG: 54  Treatment: D50 IV 50 mL  Symptoms: None  Follow-up CBG: Time:0451 CBG Result:111  Possible Reasons for Event: Inadequate meal intake    Windell Moment, RN   Remember to initiate Hypoglycemia Order Set & complete

## 2013-07-18 NOTE — Progress Notes (Signed)
S: No uremic Sxs.  Some pain in Rt leg O:BP 164/79  Pulse 91  Temp(Src) 98.3 F (36.8 C) (Oral)  Resp 15  Ht 5' 8.9" (1.75 m)  Wt 101.4 kg (223 lb 8.7 oz)  BMI 33.11 kg/m2  SpO2 96%  Intake/Output Summary (Last 24 hours) at 07/18/13 1350 Last data filed at 07/18/13 0700  Gross per 24 hour  Intake   1345 ml  Output   1375 ml  Net    -30 ml   Weight change:  FMB:WGYKZ and alert CVS:RRR Resp:Clear ant Abd:+ BS NTND Ext:no edema NEURO:CNI Ox3 no asterixis   . amLODipine  5 mg Oral Daily  . aspirin  81 mg Oral Daily  . carvedilol  25 mg Oral BID WC  . cholecalciferol  400 Units Oral Daily  . ciprofloxacin  500 mg Oral Daily  . docusate sodium  100 mg Oral Daily  . furosemide  40 mg Oral BID  . heparin  5,000 Units Subcutaneous 3 times per day  . insulin aspart  0-15 Units Subcutaneous TID WC  . metroNIDAZOLE  500 mg Oral TID  . morphine   Intravenous 6 times per day  . omega-3 acid ethyl esters  1 g Oral Daily  . pantoprazole  40 mg Oral Daily   Dg Chest 2 View  07/17/2013   CLINICAL DATA:  Hypertension.  Preop  EXAM: CHEST  2 VIEW  COMPARISON:  03/27/2012  FINDINGS: Normal heart size and mediastinal contours are within normal limits from minor hypoaeration. Hiatal. No acute infiltrate or edema. No effusion or pneumothorax. No acute osseous findings.  IMPRESSION: No active cardiopulmonary disease.   Electronically Signed   By: Tiburcio Pea M.D.   On: 07/17/2013 06:59   BMET    Component Value Date/Time   NA 137 07/18/2013 0500   K 5.4* 07/18/2013 0500   CL 105 07/18/2013 0500   CO2 22 07/18/2013 0500   GLUCOSE 104* 07/18/2013 0500   BUN 52* 07/18/2013 0500   CREATININE 6.16* 07/18/2013 0500   CALCIUM 8.2* 07/18/2013 0500   GFRNONAA 10* 07/18/2013 0500   GFRAA 12* 07/18/2013 0500   CBC    Component Value Date/Time   WBC 10.9* 07/18/2013 0500   RBC 2.79* 07/18/2013 0500   HGB 8.3* 07/18/2013 0500   HCT 24.9* 07/18/2013 0500   PLT 382 07/18/2013 0500   MCV 89.2 07/18/2013  0500   MCH 29.7 07/18/2013 0500   MCHC 33.3 07/18/2013 0500   RDW 12.8 07/18/2013 0500   LYMPHSABS 2.0 07/09/2013 1210   MONOABS 0.7 07/09/2013 1210   EOSABS 0.1 07/09/2013 1210   BASOSABS 0.0 07/09/2013 1210     Assessment: 1. SP Rt fem-pop 2. CKD 5  Scr sl up post surgery 3. Anemia 4. DM 5. HTN  Plan: 1. Check PTH 2. Daily Scr 3.  Progress notes from last week suggest AVF was to be placed prior to DC.  Will talk with Dr Darrick Penna to see if this is still the plan. 4. Check iron studies   Evalina Tabak T

## 2013-07-19 ENCOUNTER — Encounter (HOSPITAL_COMMUNITY): Payer: Self-pay | Admitting: Vascular Surgery

## 2013-07-19 LAB — BASIC METABOLIC PANEL
BUN: 58 mg/dL — AB (ref 6–23)
BUN: 58 mg/dL — ABNORMAL HIGH (ref 6–23)
CHLORIDE: 101 meq/L (ref 96–112)
CHLORIDE: 101 meq/L (ref 96–112)
CO2: 19 mEq/L (ref 19–32)
CO2: 20 mEq/L (ref 19–32)
Calcium: 8.5 mg/dL (ref 8.4–10.5)
Calcium: 8.9 mg/dL (ref 8.4–10.5)
Creatinine, Ser: 6.68 mg/dL — ABNORMAL HIGH (ref 0.50–1.35)
Creatinine, Ser: 6.74 mg/dL — ABNORMAL HIGH (ref 0.50–1.35)
GFR calc Af Amer: 11 mL/min — ABNORMAL LOW (ref >90)
GFR calc non Af Amer: 9 mL/min — ABNORMAL LOW (ref >90)
GFR calc non Af Amer: 9 mL/min — ABNORMAL LOW (ref >90)
GFR, EST AFRICAN AMERICAN: 10 mL/min — AB (ref >90)
GLUCOSE: 124 mg/dL — AB (ref 70–99)
Glucose, Bld: 137 mg/dL — ABNORMAL HIGH (ref 70–99)
POTASSIUM: 5.9 meq/L — AB (ref 3.7–5.3)
POTASSIUM: 5.4 meq/L — AB (ref 3.7–5.3)
Sodium: 132 mEq/L — ABNORMAL LOW (ref 137–147)
Sodium: 135 mEq/L — ABNORMAL LOW (ref 137–147)

## 2013-07-19 LAB — RENAL FUNCTION PANEL
Albumin: 2.3 g/dL — ABNORMAL LOW (ref 3.5–5.2)
BUN: 61 mg/dL — ABNORMAL HIGH (ref 6–23)
CALCIUM: 8.4 mg/dL (ref 8.4–10.5)
CO2: 20 mEq/L (ref 19–32)
Chloride: 102 mEq/L (ref 96–112)
Creatinine, Ser: 6.8 mg/dL — ABNORMAL HIGH (ref 0.50–1.35)
GFR calc Af Amer: 10 mL/min — ABNORMAL LOW (ref >90)
GFR, EST NON AFRICAN AMERICAN: 9 mL/min — AB (ref >90)
Glucose, Bld: 126 mg/dL — ABNORMAL HIGH (ref 70–99)
PHOSPHORUS: 5.1 mg/dL — AB (ref 2.3–4.6)
Potassium: 6.9 mEq/L (ref 3.7–5.3)
Sodium: 135 mEq/L — ABNORMAL LOW (ref 137–147)

## 2013-07-19 LAB — GLUCOSE, CAPILLARY
GLUCOSE-CAPILLARY: 126 mg/dL — AB (ref 70–99)
GLUCOSE-CAPILLARY: 136 mg/dL — AB (ref 70–99)
Glucose-Capillary: 207 mg/dL — ABNORMAL HIGH (ref 70–99)
Glucose-Capillary: 110 mg/dL — ABNORMAL HIGH (ref 70–99)

## 2013-07-19 MED ORDER — SODIUM BICARBONATE 8.4 % IV SOLN
50.0000 meq | Freq: Once | INTRAVENOUS | Status: AC
  Administered 2013-07-19: 50 meq via INTRAVENOUS
  Filled 2013-07-19: qty 50

## 2013-07-19 MED ORDER — FUROSEMIDE 10 MG/ML IJ SOLN
120.0000 mg | Freq: Once | INTRAVENOUS | Status: AC
  Administered 2013-07-19: 120 mg via INTRAVENOUS
  Filled 2013-07-19: qty 12

## 2013-07-19 MED ORDER — SODIUM CHLORIDE 0.9 % IV SOLN
1020.0000 mg | Freq: Once | INTRAVENOUS | Status: AC
  Administered 2013-07-19: 1020 mg via INTRAVENOUS
  Filled 2013-07-19: qty 34

## 2013-07-19 MED ORDER — INSULIN ASPART 100 UNIT/ML ~~LOC~~ SOLN
10.0000 [IU] | Freq: Once | SUBCUTANEOUS | Status: AC
  Administered 2013-07-19: 10 [IU] via SUBCUTANEOUS

## 2013-07-19 MED ORDER — DEXTROSE 50 % IV SOLN
1.0000 | Freq: Once | INTRAVENOUS | Status: AC
  Administered 2013-07-18: 50 mL via INTRAVENOUS
  Filled 2013-07-19: qty 50

## 2013-07-19 MED ORDER — SODIUM CHLORIDE 0.9 % IV SOLN
1.0000 g | Freq: Once | INTRAVENOUS | Status: AC
  Administered 2013-07-19: 1 g via INTRAVENOUS
  Filled 2013-07-19: qty 10

## 2013-07-19 MED ORDER — SODIUM POLYSTYRENE SULFONATE 15 GM/60ML PO SUSP
30.0000 g | Freq: Once | ORAL | Status: AC
Start: 2013-07-19 — End: 2013-07-19
  Administered 2013-07-19: 30 g via ORAL
  Filled 2013-07-19: qty 120

## 2013-07-19 NOTE — Progress Notes (Signed)
   Daily Progress Note  Assessment/Planning: POD #2 s/p R fem-BK pop with Propaten   Left foot ischemia: ABI pending.  Would not consider any toe amputation until foot has time to declare itself.  Poorly Diabetes with complications including peripheral arterial disease: on SSI, Glipizide on hold due to hypoglycemia  Hypoglycemia: likely due to NPO and Glipizide taken, pt's BG ok; will resume Glipizide later in admission  Hyperkalemia:  IV insulin/D50/Kaxyelate given  CKD Stage V: mgmt per Nephrology  Anemia of chronic disease: likely due to failing kidney and lack of Epo., would recheck CBC tomorrow, pt currently asx  Wean oxygen  OOB and ambulating  Past Medical History  Diagnosis Date  . Essential hypertension, benign   . Mixed hyperlipidemia   . Coronary atherosclerosis of native coronary artery     100% apical LAD and distal OM1, Rx medically - 11/13  . Ischemic cardiomyopathy     LVEF 40-45%  . RBBB   . PAD (peripheral artery disease)     Normal ABIs, 2011; focal left EIA stenosis; mild bilateral distal SFA disease, 2008  . CHF (congestive heart failure)   . Anginal pain   . Type 2 diabetes mellitus   . History of blood transfusion 1988    "arm went thru glass window" (10/12/2012)  . CKD (chronic kidney disease) stage 3, GFR 30-59 ml/min   . Gouty arthritis   . Depression   . NSTEMI (non-ST elevated myocardial infarction) 02/2012 and 10/2012  . Shortness of breath     when he has fluid he gets sob   Subjective  - 2 Days Post-Op  Complaining of continue R foot pain  Objective Filed Vitals:   07/19/13 0551 07/19/13 0612 07/19/13 0800 07/19/13 0859  BP: 127/52     Pulse: 98     Temp: 98.6 F (37 C)     TempSrc: Oral     Resp: 18 18 14 11   Height:      Weight:      SpO2: 97% 97% 97% 97%    Intake/Output Summary (Last 24 hours) at 07/19/13 0949 Last data filed at 07/18/13 2057  Gross per 24 hour  Intake    505 ml  Output    700 ml  Net   -195 ml     PULM  CTAB CV  RRR GI  soft, NTND VASC  R leg incision c/d/i, staples in place in vein harvest, R groin c/d/i; R foot with ischemic toes unchanged, doppler signals in R foot unchanged  Laboratory CBC    Component Value Date/Time   WBC 10.9* 07/18/2013 0500   HGB 8.3* 07/18/2013 0500   HCT 24.9* 07/18/2013 0500   PLT 382 07/18/2013 0500    BMET    Component Value Date/Time   NA 132* 07/19/2013 0655   K 5.9* 07/19/2013 0655   CL 101 07/19/2013 0655   CO2 19 07/19/2013 0655   GLUCOSE 137* 07/19/2013 0655   BUN 58* 07/19/2013 0655   CREATININE 6.68* 07/19/2013 0655   CALCIUM 8.5 07/19/2013 0655   GFRNONAA 9* 07/19/2013 0655   GFRAA 11* 07/19/2013 0655    Leonides Sake, MD [facility] Office: [phone] Pager: [phone]  07/19/2013, 9:49 AM

## 2013-07-19 NOTE — Progress Notes (Signed)
The laboratory called.  Potassium level was 6.9. MD notified, Telephone order for STAT bmet, 1 amp of D50, 1 amp of calcium gluconate and 10 units of insulin(novolog). Orders were carried out.

## 2013-07-19 NOTE — Evaluation (Signed)
Occupational Therapy Evaluation Patient Details Name: [name] MRN: [id number] DOB: 12/10/1970 Today's Date: 07/19/2013 Time: 1123-1200 OT Time Calculation (min): 37 min  OT Assessment / Plan / Recommendation History of present illness S/P R fem-pop bypass.   Clinical Impression   Pt presents with RLE pain, difficulty tolerating movement or weight on [name] during mobility, dependence in LB ADL, and lethargy (likely due to medication).  Issued pt a long handled bath sponge.  Will need education in ADL transfers, use of adaptive equipment, and available tub equipment.    OT Assessment  Patient needs continued OT Services    Follow Up Recommendations  No OT follow up    Barriers to Discharge      Equipment Recommendations  3 in 1 bedside comode    Recommendations for Other Services    Frequency  Min 2X/week    Precautions / Restrictions Precautions Precautions: Fall Precaution Comments: self limiting [name] WBing Restrictions Weight Bearing Restrictions: No Other Position/Activity Restrictions: pt self NWB [name] due to pain   Pertinent Vitals/Pain [name] pain, 6-7/10 with PCA, elevated/repositioned, VSS    ADL  Eating/Feeding: Independent Where Assessed - Eating/Feeding: Bed level Grooming: Wash/dry hands;Wash/dry face;Teeth care Where Assessed - Grooming: Unsupported sitting Upper Body Bathing: Minimal assistance (for back) Where Assessed - Upper Body Bathing: Unsupported sitting Lower Body Bathing: Maximal assistance Where Assessed - Lower Body Bathing: Unsupported sitting;Supported sit to stand Upper Body Dressing: Set up Where Assessed - Upper Body Dressing: Unsupported sitting Lower Body Dressing: Maximal assistance Where Assessed - Lower Body Dressing: Unsupported sitting;Supported sit to stand Toilet Transfer: Minimal assistance Toilet Transfer Method: Sit to stand Toilet Transfer Equipment: Bedside commode Equipment Used: Rolling walker Transfers/Ambulation  Related to ADLs: transferred only, did not ambulate ADL Comments: Pt with pain/itching of R big toe, issued long handled sponge so pt may reach without injurying his toe.  Pt will likely need adaptive equipment for LB dressing at home.    OT Diagnosis: Generalized weakness;Acute pain  OT Problem List: Decreased strength;Decreased activity tolerance;Impaired balance (sitting and/or standing);Decreased knowledge of use of DME or AE;Obesity;Pain;Increased edema OT Treatment Interventions: Self-care/ADL training;DME and/or AE instruction;Therapeutic activities;Patient/family education;Balance training   OT Goals(Current goals can be found in the care plan section) Acute Rehab OT Goals Patient Stated Goal: home OT Goal Formulation: With patient Time For Goal Achievement: 07/26/13 Potential to Achieve Goals: Good ADL Goals Pt Will Perform Grooming: with supervision;standing Pt Will Perform Lower Body Bathing: with supervision;with adaptive equipment;sit to/from stand Pt Will Perform Lower Body Dressing: with supervision;with adaptive equipment;sit to/from stand Pt Will Transfer to Toilet: with supervision;ambulating;bedside commode (over toilet) Pt Will Perform Toileting - Clothing Manipulation and hygiene: with supervision;sit to/from stand Additional ADL Goal #1: Pt will verbalize understanding of the availability of tub equipment for safety when showering.  Visit Information  Last OT Received On: 07/19/13 Assistance Needed: +1 History of Present Illness: S/P R fem-pop bypass.       Prior Functioning     Home Living Family/patient expects to be discharged to:: Private residence Living Arrangements: Spouse/significant other Available Help at Discharge: Family;Available PRN/intermittently (wife works days) Type of Home: House Home Access: Level entry Home Layout: One level Home Equipment: None Additional Comments: sister could potentially help out Prior Function Level of  Independence: Independent Communication Communication: No difficulties Dominant Hand: Right         Vision/Perception Vision - History Baseline Vision: Other (comment) (states he needs glasses) Visual History: Retinopathy;Other (  comment) (hx of laser sx) Patient Visual Report: No change from baseline   Cognition  Cognition Arousal/Alertness: Lethargic;Suspect due to medications Behavior During Therapy: WFL for tasks assessed/performed Overall Cognitive Status: Within Functional Limits for tasks assessed    Extremity/Trunk Assessment Upper Extremity Assessment Upper Extremity Assessment: RUE deficits/detail;LUE deficits/detail RUE Deficits / Details: pt with hx of wrist injury with flexor contractures of fingers RUE Sensation: history of peripheral neuropathy RUE Coordination: decreased fine motor LUE Sensation: history of peripheral neuropathy Lower Extremity Assessment Lower Extremity Assessment: Defer to PT evaluation Cervical / Trunk Assessment Cervical / Trunk Assessment: Normal     Mobility Bed Mobility Overal bed mobility: Needs Assistance Bed Mobility: Supine to Sit;Sit to Supine Supine to sit: Min assist Sit to supine: Min assist General bed mobility comments: assisted for [name] Transfers Overall transfer level: Needs assistance Sit to Stand: Min assist General transfer comment: verbal cues for hand placement and technique     Exercise     Balance     End of Session OT - End of Session Activity Tolerance: Patient limited by pain;Patient limited by lethargy Patient left: in bed;with call bell/phone within reach  GO     Evern BioMayberry, Virginio Isidore Lynn 07/19/2013, 12:06 PM [phone]

## 2013-07-19 NOTE — Progress Notes (Signed)
Pt reports nausea with large amt of emesis into trash can.  Wife states "he threw up his whole dinner."  PRN zofran given per order.  Will con't plan of care.

## 2013-07-19 NOTE — Progress Notes (Signed)
S: No uremic Sxs.  Still with some pain in Rt leg O:BP 127/52  Pulse 98  Temp(Src) 98.6 F (37 C) (Oral)  Resp 18  Ht 5' 8.9" (1.75 m)  Wt 101.4 kg (223 lb 8.7 oz)  BMI 33.11 kg/m2  SpO2 97%  Intake/Output Summary (Last 24 hours) at 07/19/13 0744 Last data filed at 07/18/13 2057  Gross per 24 hour  Intake    505 ml  Output    700 ml  Net   -195 ml   Weight change:  DXI:PJASN and alert CVS:RRR Resp:Clear ant Abd:+ BS NTND Ext:1+ edema on Rt leg.  Trace Lt leg.  Ischemic toes on Rt foot NEURO:CNI Ox3 no asterixis   . amLODipine  5 mg Oral Daily  . aspirin  81 mg Oral Daily  . calcium gluconate  1 g Intravenous Once  . carvedilol  25 mg Oral BID WC  . cholecalciferol  400 Units Oral Daily  . ciprofloxacin  500 mg Oral Daily  . docusate sodium  100 mg Oral Daily  . furosemide  120 mg Intravenous Once  . furosemide  40 mg Oral BID  . heparin  5,000 Units Subcutaneous 3 times per day  . insulin aspart  0-15 Units Subcutaneous TID WC  . metroNIDAZOLE  500 mg Oral TID  . morphine   Intravenous 6 times per day  . omega-3 acid ethyl esters  1 g Oral Daily  . pantoprazole  40 mg Oral Daily  . sodium polystyrene  30 g Oral Once   No results found. BMET    Component Value Date/Time   NA 135* 07/19/2013 0248   K 6.9* 07/19/2013 0248   CL 102 07/19/2013 0248   CO2 20 07/19/2013 0248   GLUCOSE 126* 07/19/2013 0248   BUN 61* 07/19/2013 0248   CREATININE 6.80* 07/19/2013 0248   CALCIUM 8.4 07/19/2013 0248   GFRNONAA 9* 07/19/2013 0248   GFRAA 10* 07/19/2013 0248   CBC    Component Value Date/Time   WBC 10.9* 07/18/2013 0500   RBC 2.79* 07/18/2013 0500   HGB 8.3* 07/18/2013 0500   HCT 24.9* 07/18/2013 0500   PLT 382 07/18/2013 0500   MCV 89.2 07/18/2013 0500   MCH 29.7 07/18/2013 0500   MCHC 33.3 07/18/2013 0500   RDW 12.8 07/18/2013 0500   LYMPHSABS 2.0 07/09/2013 1210   MONOABS 0.7 07/09/2013 1210   EOSABS 0.1 07/09/2013 1210   BASOSABS 0.0 07/09/2013 1210     Assessment: 1. SP Rt  fem-pop 2. CKD 5  Scr sl up post surgery 3. Anemia 4. DM 5. HTN 6. Hyperkalemia  Plan: 1. IV IRON 2. Try to treat hyperkalemia medically, if unable then will need HD.  He is aware of this. 3. Insulin, kayexalate, IV lasix, Bicarb now and recheck K at noon 4. Renal in AM  [name] T

## 2013-07-19 NOTE — Progress Notes (Signed)
Physical Therapy Treatment Patient Details Name: [name] MRN: [id number] DOB: [birth year] Today's Date: 07/19/2013 Time: 6015-6153 PT Time Calculation (min): 28 min  PT Assessment / Plan / Recommendation  History of Present Illness S/P R fem-pop bypass.   PT Comments   Still self limiting with a lot of procrastination.  Discussed all the important reasons while we needed to be ambulatory.  Pt needed a lot of cuing for proper use of the RW and consistent postural checks.   Follow Up Recommendations  No PT follow up     Does the patient have the potential to tolerate intense rehabilitation     Barriers to Discharge        Equipment Recommendations  Rolling walker with 5" wheels    Recommendations for Other Services    Frequency Min 3X/week   Progress towards PT Goals Progress towards PT goals: Progressing toward goals  Plan Current plan remains appropriate    Precautions / Restrictions Precautions Precautions: Fall Precaution Comments: self limiting [name] WBing Restrictions Weight Bearing Restrictions: No   Pertinent Vitals/Pain     Mobility  Bed Mobility Supine to sit: Min assist Sit to supine: Min assist General bed mobility comments: assisted for [name] Transfers Overall transfer level: Needs assistance Equipment used: Rolling walker (2 wheeled) Transfers: Sit to/from Stand Sit to Stand: Min assist General transfer comment: verbal cues for hand placement and technique Ambulation/Gait Ambulation/Gait assistance: Min guard Ambulation Distance (Feet): 180 Feet Assistive device: Rolling walker (2 wheeled) Gait Pattern/deviations: Step-through pattern Gait velocity: slow General Gait Details: Mildly antalgic with step to gait, but did place weight on to [name]    Exercises     PT Diagnosis:    PT Problem List:   PT Treatment Interventions:     PT Goals (current goals can now be found in the care plan section) Acute Rehab PT Goals Patient Stated Goal:  home PT Goal Formulation: With patient Time For Goal Achievement: 07/25/13 Potential to Achieve Goals: Good  Visit Information  Last PT Received On: 07/19/13 Assistance Needed: +1 History of Present Illness: S/P R fem-pop bypass.    Subjective Data  Subjective:  I need to get cleaned up first. Patient Stated Goal: home   Cognition  Cognition Behavior During Therapy: WFL for tasks assessed/performed Overall Cognitive Status: Within Functional Limits for tasks assessed    Balance  Balance Standing balance support: Bilateral upper extremity supported Standing balance-Leahy Scale: Poor Standing balance comment: assist to maintain balance during pericare  End of Session PT - End of Session Activity Tolerance: Patient limited by pain Patient left: in bed;with call bell/phone within reach;with family/visitor present Nurse Communication: Mobility status   GP     [name], [name] 07/19/2013, 5:25 PM 07/19/2013  [name], PT [phone]  (pager)

## 2013-07-19 NOTE — Progress Notes (Signed)
CRITICAL VALUE ALERT  Critical value received:  Potassium 6.9  Date of notification:  07/19/2013  Time of notification:  4:29  Critical value read back:yes  Nurse who received alert:  Deseri Loss  MD notified (1st page):  Dr Hart Rochester notified, orders received  Time of first page:   MD notified (2nd page):  Time of second page:  Responding MD:  Dr Hart Rochester  Time MD responded:

## 2013-07-19 NOTE — Care Management Note (Signed)
    Page 1 of 1   07/19/2013     4:03:15 PM   CARE MANAGEMENT NOTE 07/19/2013  Patient:  [name], [name]   Account Number:  [id number]  Date Initiated:  07/19/2013  Documentation initiated by:  Oren Barella  Subjective/Objective Assessment:   PT S/P RT FEM POP BYPASS AND COMMON FEMORAL/EXTERNAL ILIAC ENDARTERECTOMY ON 07/17/13.  PTA, PT INDEPENDENT, LIVES WITH WIFE.     Action/Plan:   WILL FOLLOW FOR DC NEEDS AS PT PROGRESSES.  PT/OT RECOMMENDING NO FOLLOW UP AT DC.   Anticipated DC Date:  07/23/2013   Anticipated DC Plan:  HOME/SELF CARE      DC Planning Services  CM consult      Choice offered to / List presented to:             Status of service:  In process, will continue to follow Medicare Important Message given?   (If response is "NO", the following Medicare IM given date fields will be blank) Date Medicare IM given:   Date Additional Medicare IM given:    Discharge Disposition:    Per UR Regulation:  Reviewed for med. necessity/level of care/duration of stay  If discussed at Long Length of Stay Meetings, dates discussed:    Comments:

## 2013-07-20 DIAGNOSIS — Z48812 Encounter for surgical aftercare following surgery on the circulatory system: Secondary | ICD-10-CM

## 2013-07-20 LAB — CBC
HCT: 25.1 % — ABNORMAL LOW (ref 39.0–52.0)
Hemoglobin: 8.3 g/dL — ABNORMAL LOW (ref 13.0–17.0)
MCH: 30 pg (ref 26.0–34.0)
MCHC: 33.1 g/dL (ref 30.0–36.0)
MCV: 90.6 fL (ref 78.0–100.0)
Platelets: 414 10*3/uL — ABNORMAL HIGH (ref 150–400)
RBC: 2.77 MIL/uL — AB (ref 4.22–5.81)
RDW: 12.7 % (ref 11.5–15.5)
WBC: 13.2 10*3/uL — ABNORMAL HIGH (ref 4.0–10.5)

## 2013-07-20 LAB — GLUCOSE, CAPILLARY
GLUCOSE-CAPILLARY: 118 mg/dL — AB (ref 70–99)
GLUCOSE-CAPILLARY: 169 mg/dL — AB (ref 70–99)
Glucose-Capillary: 142 mg/dL — ABNORMAL HIGH (ref 70–99)
Glucose-Capillary: 129 mg/dL — ABNORMAL HIGH (ref 70–99)

## 2013-07-20 LAB — RENAL FUNCTION PANEL
Albumin: 2.2 g/dL — ABNORMAL LOW (ref 3.5–5.2)
BUN: 61 mg/dL — ABNORMAL HIGH (ref 6–23)
CALCIUM: 9 mg/dL (ref 8.4–10.5)
CO2: 20 mEq/L (ref 19–32)
Chloride: 100 mEq/L (ref 96–112)
Creatinine, Ser: 6.55 mg/dL — ABNORMAL HIGH (ref 0.50–1.35)
GFR calc Af Amer: 11 mL/min — ABNORMAL LOW (ref >90)
GFR calc non Af Amer: 9 mL/min — ABNORMAL LOW (ref >90)
GLUCOSE: 127 mg/dL — AB (ref 70–99)
PHOSPHORUS: 6.4 mg/dL — AB (ref 2.3–4.6)
POTASSIUM: 5.7 meq/L — AB (ref 3.7–5.3)
Sodium: 137 mEq/L (ref 137–147)

## 2013-07-20 LAB — BASIC METABOLIC PANEL
BUN: 59 mg/dL — ABNORMAL HIGH (ref 6–23)
CALCIUM: 9 mg/dL (ref 8.4–10.5)
CO2: 22 meq/L (ref 19–32)
Chloride: 100 mEq/L (ref 96–112)
Creatinine, Ser: 6.23 mg/dL — ABNORMAL HIGH (ref 0.50–1.35)
GFR calc Af Amer: 11 mL/min — ABNORMAL LOW (ref >90)
GFR, EST NON AFRICAN AMERICAN: 10 mL/min — AB (ref >90)
Glucose, Bld: 170 mg/dL — ABNORMAL HIGH (ref 70–99)
Potassium: 5.1 mEq/L (ref 3.7–5.3)
SODIUM: 138 meq/L (ref 137–147)

## 2013-07-20 LAB — PARATHYROID HORMONE, INTACT (NO CA): PTH: 176.1 pg/mL — ABNORMAL HIGH (ref 14.0–72.0)

## 2013-07-20 MED ORDER — FUROSEMIDE 80 MG PO TABS
80.0000 mg | ORAL_TABLET | Freq: Two times a day (BID) | ORAL | Status: DC
  Administered 2013-07-20: 80 mg via ORAL
  Filled 2013-07-20 (×3): qty 1

## 2013-07-20 MED ORDER — FUROSEMIDE 80 MG PO TABS
80.0000 mg | ORAL_TABLET | Freq: Two times a day (BID) | ORAL | Status: DC
  Administered 2013-07-20 – 2013-07-23 (×6): 80 mg via ORAL
  Filled 2013-07-20 (×9): qty 1

## 2013-07-20 MED ORDER — SODIUM POLYSTYRENE SULFONATE 15 GM/60ML PO SUSP
30.0000 g | Freq: Once | ORAL | Status: AC
  Administered 2013-07-20: 30 g via ORAL
  Filled 2013-07-20: qty 120

## 2013-07-20 NOTE — Progress Notes (Signed)
OT Cancellation Note  Patient Details Name: [name] MRN: [id number] DOB: 1971/03/10   Cancelled Treatment:    Reason Eval/Treat Not Completed: Fatigue/lethargy limiting ability to participate. Pt requesting OT to return at later time. Will re-attempt later today as time allows.    07/20/2013 Cipriano Mile OTR/L Pager [phone] Office [phone]

## 2013-07-20 NOTE — Progress Notes (Signed)
S: No uremic Sxs.  Rt leg pain better O:BP 119/76  Pulse 93  Temp(Src) 97.9 F (36.6 C) (Oral)  Resp 17  Ht 5' 8.9" (1.75 m)  Wt 106.187 kg (234 lb 1.6 oz)  BMI 34.67 kg/m2  SpO2 97%  Intake/Output Summary (Last 24 hours) at 07/20/13 0742 Last data filed at 07/20/13 0309  Gross per 24 hour  Intake    480 ml  Output   2151 ml  Net  -1671 ml   Weight change:  DGU:YQIHK and alert CVS:RRR Resp:Clear ant Abd:+ BS NTND Ext:1+ edema on Rt leg.  Trace Lt leg.  Ischemic toes on Rt foot, improved somewhat NEURO:CNI Ox3 no asterixis   . amLODipine  5 mg Oral Daily  . aspirin  81 mg Oral Daily  . carvedilol  25 mg Oral BID WC  . cholecalciferol  400 Units Oral Daily  . ciprofloxacin  500 mg Oral Daily  . docusate sodium  100 mg Oral Daily  . furosemide  40 mg Oral BID  . heparin  5,000 Units Subcutaneous 3 times per day  . insulin aspart  0-15 Units Subcutaneous TID WC  . metroNIDAZOLE  500 mg Oral TID  . morphine   Intravenous 6 times per day  . omega-3 acid ethyl esters  1 g Oral Daily  . pantoprazole  40 mg Oral Daily   No results found. BMET    Component Value Date/Time   NA 137 07/20/2013 0601   K 5.7* 07/20/2013 0601   CL 100 07/20/2013 0601   CO2 20 07/20/2013 0601   GLUCOSE 127* 07/20/2013 0601   BUN 61* 07/20/2013 0601   CREATININE 6.55* 07/20/2013 0601   CALCIUM 9.0 07/20/2013 0601   GFRNONAA 9* 07/20/2013 0601   GFRAA 11* 07/20/2013 0601   CBC    Component Value Date/Time   WBC 10.9* 07/18/2013 0500   RBC 2.79* 07/18/2013 0500   HGB 8.3* 07/18/2013 0500   HCT 24.9* 07/18/2013 0500   PLT 382 07/18/2013 0500   MCV 89.2 07/18/2013 0500   MCH 29.7 07/18/2013 0500   MCHC 33.3 07/18/2013 0500   RDW 12.8 07/18/2013 0500   LYMPHSABS 2.0 07/09/2013 1210   MONOABS 0.7 07/09/2013 1210   EOSABS 0.1 07/09/2013 1210   BASOSABS 0.0 07/09/2013 1210     Assessment: 1. SP Rt fem-pop 2. CKD 5  Scr sl up post surgery 3. Anemia, received IV iron 4. DM 5. HTN 6. Hyperkalemia, K better  but still sl high  Plan: 1. Kayexalate 30gm po now, recheck K later today 2. Increase lasix dose 3. Recheck labs in AM Kahner Yanik T

## 2013-07-20 NOTE — Progress Notes (Addendum)
[facility] Progress Note  07/20/2013 11:44 AM 3 Days Post-Op  Subjective:  "My foot still hurts"  Afebrile VSS 99% RA  Filed Vitals:   07/20/13 1132  BP:   Pulse:   Temp:   Resp: 11    Physical Exam: Incisions:  All incisions are c/d/i Extremities:  Right foot warm with some edema in right foot/leg.  CBC    Component Value Date/Time   WBC 13.2* 07/20/2013 0730   RBC 2.77* 07/20/2013 0730   HGB 8.3* 07/20/2013 0730   HCT 25.1* 07/20/2013 0730   PLT 414* 07/20/2013 0730   MCV 90.6 07/20/2013 0730   MCH 30.0 07/20/2013 0730   MCHC 33.1 07/20/2013 0730   RDW 12.7 07/20/2013 0730   LYMPHSABS 2.0 07/09/2013 1210   MONOABS 0.7 07/09/2013 1210   EOSABS 0.1 07/09/2013 1210   BASOSABS 0.0 07/09/2013 1210    BMET    Component Value Date/Time   NA 137 07/20/2013 0601   K 5.7* 07/20/2013 0601   CL 100 07/20/2013 0601   CO2 20 07/20/2013 0601   GLUCOSE 127* 07/20/2013 0601   BUN 61* 07/20/2013 0601   CREATININE 6.55* 07/20/2013 0601   CALCIUM 9.0 07/20/2013 0601   GFRNONAA 9* 07/20/2013 0601   GFRAA 11* 07/20/2013 0601    INR    Component Value Date/Time   INR 1.07 07/17/2013 0610     Intake/Output Summary (Last 24 hours) at 07/20/13 1144 Last data filed at 07/20/13 0934  Gross per 24 hour  Intake    480 ml  Output   2851 ml  Net  -2371 ml     Assessment:  43 y.o. male is s/p:  Right femoral to below knee popliteal bypass with Propaten, common femoral and external iliac endarterectomy, extended profundaplasty  3 Days Post-Op  Plan: -right foot is warm and great toe improving -DVT prophylaxis:  SQ heparin -wean PCA and begin oral pain medication -continue to increase mobilization/PT -still hyperkalemic, but improved-receiving more Kayexalate today -creatinine slightly improved from yesterday-appreciate renal's help -slight increase in WBC -acute surgical blood loss anemia is stable -need to keep dry gauze to right groin to wick moisture and help prevent  wound infection.   Doreatha Massed, PA-C [facility] [phone] 07/20/2013 11:44 AM

## 2013-07-20 NOTE — Progress Notes (Signed)
VASCULAR LAB PRELIMINARY  ARTERIAL  ABI completed:    RIGHT    LEFT    PRESSURE WAVEFORM  PRESSURE WAVEFORM  BRACHIAL Saving arm for HD Triphasic  BRACHIAL 153 Triphasic   DP 107 Monophasic  DP 101 Monophasic   AT   AT    PT 75 Monophasic  PT 128 Triphasic   PER   PER    GREAT TOE  NA GREAT TOE  NA    RIGHT LEFT  ABI 0.70 0.84     [name], RVT 07/20/2013, 1:38 PM

## 2013-07-20 NOTE — Progress Notes (Signed)
Physical Therapy Treatment Patient Details Name: CASHIUS HOLMEN MRN: [id number] DOB: May 25, 1970 Today's Date: 07/20/2013 Time: 3570-1779 PT Time Calculation (min): 24 min  PT Assessment / Plan / Recommendation  History of Present Illness S/P R fem-pop bypass.   PT Comments   Starting to progress well.  Still with step to gait and needs encouragement to push himself, but now starting to do things for himself.   Follow Up Recommendations  No PT follow up     Does the patient have the potential to tolerate intense rehabilitation     Barriers to Discharge        Equipment Recommendations  Rolling walker with 5" wheels    Recommendations for Other Services    Frequency Min 3X/week   Progress towards PT Goals Progress towards PT goals: Progressing toward goals  Plan Current plan remains appropriate    Precautions / Restrictions Precautions Precautions: Fall Restrictions Weight Bearing Restrictions: No   Pertinent Vitals/Pain     Mobility  Bed Mobility Overal bed mobility: Modified Independent Transfers Overall transfer level: Needs assistance Transfers: Sit to/from Stand Sit to Stand: Min assist General transfer comment: verbal cues for hand placement and technique Ambulation/Gait Ambulation/Gait assistance: Supervision Ambulation Distance (Feet): 350 Feet Assistive device: Rolling walker (2 wheeled) Gait Pattern/deviations: Step-to pattern;Decreased step length - right;Decreased step length - left;Decreased stance time - right Gait velocity: slow Gait velocity interpretation: Below normal speed for age/gender General Gait Details: steady, but slow and guard so as not to put pressure on his toes    Exercises     PT Diagnosis:    PT Problem List:   PT Treatment Interventions:     PT Goals (current goals can now be found in the care plan section) Acute Rehab PT Goals Patient Stated Goal: home PT Goal Formulation: With patient Time For Goal Achievement:  07/25/13 Potential to Achieve Goals: Good  Visit Information  Last PT Received On: 07/20/13 History of Present Illness: S/P R fem-pop bypass.    Subjective Data  Subjective: If I'm going to [facility]) to others it's good to understand and (empatize) Patient Stated Goal: home   Cognition  Cognition Behavior During Therapy: WFL for tasks assessed/performed Overall Cognitive Status: Within Functional Limits for tasks assessed    Balance  Balance Standing balance support: Single extremity supported;Bilateral upper extremity supported Standing balance-Leahy Scale: Fair  End of Session PT - End of Session Equipment Utilized During Treatment: Gait belt Activity Tolerance: Patient tolerated treatment well Patient left: in bed;with call bell/phone within reach;with family/visitor present Nurse Communication: Mobility status   GP     Jenevie Casstevens, Eliseo Gum 07/20/2013, 6:18 PM 07/20/2013  Fortuna Bing, PT [phone]  (pager)

## 2013-07-21 LAB — RENAL FUNCTION PANEL
Albumin: 2.5 g/dL — ABNORMAL LOW (ref 3.5–5.2)
BUN: 57 mg/dL — AB (ref 6–23)
CALCIUM: 9.6 mg/dL (ref 8.4–10.5)
CO2: 22 meq/L (ref 19–32)
CREATININE: 5.66 mg/dL — AB (ref 0.50–1.35)
Chloride: 100 mEq/L (ref 96–112)
GFR calc Af Amer: 13 mL/min — ABNORMAL LOW (ref >90)
GFR calc non Af Amer: 11 mL/min — ABNORMAL LOW (ref >90)
GLUCOSE: 110 mg/dL — AB (ref 70–99)
Phosphorus: 6.7 mg/dL — ABNORMAL HIGH (ref 2.3–4.6)
Potassium: 4.8 mEq/L (ref 3.7–5.3)
Sodium: 140 mEq/L (ref 137–147)

## 2013-07-21 LAB — CBC
HEMATOCRIT: 22.8 % — AB (ref 39.0–52.0)
HEMOGLOBIN: 7.6 g/dL — AB (ref 13.0–17.0)
MCH: 29.9 pg (ref 26.0–34.0)
MCHC: 33.3 g/dL (ref 30.0–36.0)
MCV: 89.8 fL (ref 78.0–100.0)
Platelets: 489 10*3/uL — ABNORMAL HIGH (ref 150–400)
RBC: 2.54 MIL/uL — ABNORMAL LOW (ref 4.22–5.81)
RDW: 12.7 % (ref 11.5–15.5)
WBC: 13.8 10*3/uL — ABNORMAL HIGH (ref 4.0–10.5)

## 2013-07-21 LAB — TYPE AND SCREEN
ABO/RH(D): A POS
Antibody Screen: NEGATIVE
UNIT DIVISION: 0
Unit division: 0

## 2013-07-21 LAB — GLUCOSE, CAPILLARY
GLUCOSE-CAPILLARY: 111 mg/dL — AB (ref 70–99)
GLUCOSE-CAPILLARY: 125 mg/dL — AB (ref 70–99)
Glucose-Capillary: 157 mg/dL — ABNORMAL HIGH (ref 70–99)
Glucose-Capillary: 109 mg/dL — ABNORMAL HIGH (ref 70–99)

## 2013-07-21 MED ORDER — MORPHINE SULFATE 2 MG/ML IJ SOLN
2.0000 mg | INTRAMUSCULAR | Status: DC | PRN
  Administered 2013-07-21 – 2013-07-23 (×16): 2 mg via INTRAVENOUS
  Filled 2013-07-21 (×16): qty 1

## 2013-07-21 MED ORDER — CALCITRIOL 0.25 MCG PO CAPS
0.2500 ug | ORAL_CAPSULE | Freq: Every day | ORAL | Status: DC
  Administered 2013-07-21 – 2013-07-23 (×3): 0.25 ug via ORAL
  Filled 2013-07-21 (×3): qty 1

## 2013-07-21 MED ORDER — CALCIUM ACETATE 667 MG PO CAPS
667.0000 mg | ORAL_CAPSULE | Freq: Three times a day (TID) | ORAL | Status: DC
  Administered 2013-07-21 – 2013-07-23 (×6): 667 mg via ORAL
  Filled 2013-07-21 (×9): qty 1

## 2013-07-21 NOTE — Progress Notes (Signed)
Cardiac Rehab nurse called me to advise me that pt. Was projectile vomiting and that his respirations possibly were being affected. I went to assess pt. Who was still actively vomiting, I paused PCA pump, gave pt. Zofran I.V. For the N/V and paged MD on call. MD advised to give pt. Oxycodone for the pain because the relief would last longer and that since pt. Didn't want to be taken off PCA today that he would definitely would be D/C'd from PCA tomorrow. I gave pt. Oxycodone and pt.'s vomiting seemed to have subsided at the moment. Pt. Was under the impression he didn't have to wear the CO2 Monitor anymore, I explained to pt. He has to wear the CO2 Monitor as long as he's on the PCA pump and reiterated to pt. That he would be taken off PCA pump on tomorrow. Pt. Understood and seemed stable. Respirations were 16, EtCO2 was 27, O2 Sats were 95%, pt. Did not appear to be in any acute respiratory distress.

## 2013-07-21 NOTE — Progress Notes (Signed)
    Subjective  - POD #4, s/p Right femoral to below knee popliteal bypass with Propaten, common femoral and external iliac endarterectomy, extended profundaplasty   Still complaining of pain, particularly in his right great toe Once no if he can go home by Monday which is his birthday as well as his bothers.   Physical Exam:  Right foot is warm and well perfused. Ischemic changes to right great toe appeared to be improving. Incisions are clean dry and intact. Right groin is moist    Assessment/Plan:  POD #4  The patient is still requiring a PCA for pain control.  He is going to try to minimize utilization today and use oral narcotics.  The plan is to remove his PCA tomorrow and potentially try for discharge on Monday.  I stressed the importance of keeping the right groin dry.  The patient will need dialysis access.  Vein mapping has been ordered.  The patient would like to recover a little more before having surgery.  I feel like this is reasonable.  He potentially could be discharged to home on Monday with a planned date for dialysis access procedure  BRABHAM IV, V. WELLS 07/21/2013 10:47 AM --  Ceasar Mons Vitals:   07/21/13 0900  BP: 150/61  Pulse: 98  Temp:   Resp:     Intake/Output Summary (Last 24 hours) at 07/21/13 1047 Last data filed at 07/21/13 0806  Gross per 24 hour  Intake    360 ml  Output   1550 ml  Net  -1190 ml     Laboratory CBC    Component Value Date/Time   WBC 13.8* 07/21/2013 0627   HGB 7.6* 07/21/2013 0627   HCT 22.8* 07/21/2013 0627   PLT 489* 07/21/2013 0627    BMET    Component Value Date/Time   NA 140 07/21/2013 0627   K 4.8 07/21/2013 0627   CL 100 07/21/2013 0627   CO2 22 07/21/2013 0627   GLUCOSE 110* 07/21/2013 0627   BUN 57* 07/21/2013 0627   CREATININE 5.66* 07/21/2013 0627   CALCIUM 9.6 07/21/2013 0627   GFRNONAA 11* 07/21/2013 0627   GFRAA 13* 07/21/2013 0627    COAG Lab Results  Component Value Date   INR 1.07 07/17/2013   INR 0.9 01/02/2007   No results found for this basename: PTT    Antibiotics Anti-infectives   Start     Dose/Rate Route Frequency Ordered Stop   07/17/13 2000  ciprofloxacin (CIPRO) tablet 500 mg  Status:  Discontinued     500 mg Oral 2 times daily 07/17/13 1843 07/17/13 1845   07/17/13 2000  metroNIDAZOLE (FLAGYL) tablet 500 mg     500 mg Oral 3 times daily 07/17/13 1843     07/17/13 2000  ciprofloxacin (CIPRO) tablet 500 mg     500 mg Oral Daily 07/17/13 1850     07/17/13 1845  cefUROXime (ZINACEF) 1.5 g in dextrose 5 % 50 mL IVPB     1.5 g 100 mL/hr over 30 Minutes Intravenous Every 12 hours 07/17/13 1843 07/18/13 0628   07/16/13 1442  cefUROXime (ZINACEF) 1.5 g in dextrose 5 % 50 mL IVPB     1.5 g 100 mL/hr over 30 Minutes Intravenous 30 min pre-op 07/16/13 1442 07/17/13 0801       V. Charlena Cross, M.D. [facility] Office: [phone] Pager:  [phone]

## 2013-07-21 NOTE — Progress Notes (Signed)
S: No uremic Sxs.  Wants to know when he will go home O:BP 171/96  Pulse 96  Temp(Src) 98.1 F (36.7 C) (Oral)  Resp 17  Ht 5' 8.9" (1.75 m)  Wt 106.187 kg (234 lb 1.6 oz)  BMI 34.67 kg/m2  SpO2 98%  Intake/Output Summary (Last 24 hours) at 07/21/13 0659 Last data filed at 07/20/13 2000  Gross per 24 hour  Intake    480 ml  Output   2250 ml  Net  -1770 ml   Weight change:  KJI:ZXYOF and alert CVS:RRR Resp:Clear ant Abd:+ BS NTND Ext:1+ edema on Rt leg.  Trace Lt leg.  Ischemic toes on Rt foot, improved somewhat NEURO:CNI Ox3 no asterixis   . amLODipine  5 mg Oral Daily  . aspirin  81 mg Oral Daily  . carvedilol  25 mg Oral BID WC  . cholecalciferol  400 Units Oral Daily  . ciprofloxacin  500 mg Oral Daily  . docusate sodium  100 mg Oral Daily  . furosemide  80 mg Oral BID  . heparin  5,000 Units Subcutaneous 3 times per day  . insulin aspart  0-15 Units Subcutaneous TID WC  . metroNIDAZOLE  500 mg Oral TID  . morphine   Intravenous 6 times per day  . omega-3 acid ethyl esters  1 g Oral Daily  . pantoprazole  40 mg Oral Daily   No results found. BMET    Component Value Date/Time   NA 138 07/20/2013 1340   K 5.1 07/20/2013 1340   CL 100 07/20/2013 1340   CO2 22 07/20/2013 1340   GLUCOSE 170* 07/20/2013 1340   BUN 59* 07/20/2013 1340   CREATININE 6.23* 07/20/2013 1340   CALCIUM 9.0 07/20/2013 1340   GFRNONAA 10* 07/20/2013 1340   GFRAA 11* 07/20/2013 1340   CBC    Component Value Date/Time   WBC 13.2* 07/20/2013 0730   RBC 2.77* 07/20/2013 0730   HGB 8.3* 07/20/2013 0730   HCT 25.1* 07/20/2013 0730   PLT 414* 07/20/2013 0730   MCV 90.6 07/20/2013 0730   MCH 30.0 07/20/2013 0730   MCHC 33.1 07/20/2013 0730   RDW 12.7 07/20/2013 0730   LYMPHSABS 2.0 07/09/2013 1210   MONOABS 0.7 07/09/2013 1210   EOSABS 0.1 07/09/2013 1210   BASOSABS 0.0 07/09/2013 1210     Assessment: 1. SP Rt fem-pop 2. CKD 5  Scr sl up post surgery 3. Anemia, received IV iron 4. DM 5. HTN, BP  running high 6. Hyperkalemia, K better but still sl high 7. Sec HPTH  Plan: 1. Increase amlodipine to 10mg  q d  2. Labs pending this AM 3.  ? If there are plans to place vascular access before DC as mentioned in notes from last week 4. Start calcitriol for Sec HPTH 4. Recheck renal profile in AM [name] T

## 2013-07-22 LAB — GLUCOSE, CAPILLARY
GLUCOSE-CAPILLARY: 174 mg/dL — AB (ref 70–99)
Glucose-Capillary: 99 mg/dL (ref 70–99)
Glucose-Capillary: 168 mg/dL — ABNORMAL HIGH (ref 70–99)
Glucose-Capillary: 139 mg/dL — ABNORMAL HIGH (ref 70–99)

## 2013-07-22 LAB — RENAL FUNCTION PANEL
Albumin: 2.3 g/dL — ABNORMAL LOW (ref 3.5–5.2)
BUN: 58 mg/dL — ABNORMAL HIGH (ref 6–23)
CALCIUM: 9 mg/dL (ref 8.4–10.5)
CO2: 21 meq/L (ref 19–32)
Chloride: 96 mEq/L (ref 96–112)
Creatinine, Ser: 5.3 mg/dL — ABNORMAL HIGH (ref 0.50–1.35)
GFR, EST AFRICAN AMERICAN: 14 mL/min — AB (ref >90)
GFR, EST NON AFRICAN AMERICAN: 12 mL/min — AB (ref >90)
Glucose, Bld: 105 mg/dL — ABNORMAL HIGH (ref 70–99)
Phosphorus: 6.2 mg/dL — ABNORMAL HIGH (ref 2.3–4.6)
Potassium: 4.3 mEq/L (ref 3.7–5.3)
SODIUM: 136 meq/L — AB (ref 137–147)

## 2013-07-22 MED ORDER — DARBEPOETIN ALFA-POLYSORBATE 100 MCG/0.5ML IJ SOLN
100.0000 ug | INTRAMUSCULAR | Status: DC
  Administered 2013-07-22: 100 ug via SUBCUTANEOUS
  Filled 2013-07-22 (×2): qty 0.5

## 2013-07-22 NOTE — Progress Notes (Signed)
Pt had c/o severe 10/10 aching/burning pain in right foot unrelieved by Percocet. We tried elevation and hanging foot down without relief. Rubbing his right great toe gently seems to help. Strong doppler flow auscultated in right DP and PT. Dr. Myra Gianotti notified of pain, order received for Morphine. First dose of Morphine did not help. Second dose helped mildly. Pt then walked in hall 200 feet using walker, which helped some. Relaxation channel put on and I rubbed toe until pt went to sleep.

## 2013-07-22 NOTE — Progress Notes (Signed)
Phlebotomy having difficulty obtaining enough blood for am labs. Third phlebotomist to try.

## 2013-07-22 NOTE — Progress Notes (Signed)
S: No uremic Sxs.  Still with some pain in Rt gr toe.  Up walking yest O:BP 147/64  Pulse 90  Temp(Src) 98.3 F (36.8 C) (Oral)  Resp 18  Ht 5' 8.9" (1.75 m)  Wt 106.187 kg (234 lb 1.6 oz)  BMI 34.67 kg/m2  SpO2 97%  Intake/Output Summary (Last 24 hours) at 08/06/2013 0718 Last data filed at 07/10/2013 0527  Gross per 24 hour  Intake    360 ml  Output    600 ml  Net   -240 ml   Weight change:  IRC:VELFY and alert CVS:RRR Resp:Clear ant Abd:+ BS NTND Ext:1+ edema on Rt leg.  Trace Lt leg.  Ischemic toes on Rt foot,  Wounds healing well NEURO:CNI Ox3 no asterixis   . amLODipine  5 mg Oral Daily  . aspirin  81 mg Oral Daily  . calcitRIOL  0.25 mcg Oral Daily  . calcium acetate  667 mg Oral TID WC  . carvedilol  25 mg Oral BID WC  . ciprofloxacin  500 mg Oral Daily  . docusate sodium  100 mg Oral Daily  . furosemide  80 mg Oral BID  . heparin  5,000 Units Subcutaneous 3 times per day  . insulin aspart  0-15 Units Subcutaneous TID WC  . metroNIDAZOLE  500 mg Oral TID  . omega-3 acid ethyl esters  1 g Oral Daily  . pantoprazole  40 mg Oral Daily   No results found. BMET    Component Value Date/Time   NA 140 07/21/2013 0627   K 4.8 07/21/2013 0627   CL 100 07/21/2013 0627   CO2 22 07/21/2013 0627   GLUCOSE 110* 07/21/2013 0627   BUN 57* 07/21/2013 0627   CREATININE 5.66* 07/21/2013 0627   CALCIUM 9.6 07/21/2013 0627   GFRNONAA 11* 07/21/2013 0627   GFRAA 13* 07/21/2013 0627   CBC    Component Value Date/Time   WBC 13.8* 07/21/2013 0627   RBC 2.54* 07/21/2013 0627   HGB 7.6* 07/21/2013 0627   HCT 22.8* 07/21/2013 0627   PLT 489* 07/21/2013 0627   MCV 89.8 07/21/2013 0627   MCH 29.9 07/21/2013 0627   MCHC 33.3 07/21/2013 0627   RDW 12.7 07/21/2013 0627   LYMPHSABS 2.0 07/09/2013 1210   MONOABS 0.7 07/09/2013 1210   EOSABS 0.1 07/09/2013 1210   BASOSABS 0.0 07/09/2013 1210     Assessment: 1. SP Rt fem-pop 2. CKD 5  Scr sl up post surgery, but trending down yest 3. Anemia,  received IV iron 4. DM 5. HTN,  6. Hyperkalemia, K better but still sl high 7. Sec HPTH  Plan: 1. Still think it would be best to have access placed prior to DC 2. Labs today pending 3. Will give dose of aranesp 4. He will need FU with Dr Lowell Guitar post DC Orlanda Frankum T

## 2013-07-22 NOTE — Progress Notes (Signed)
    Subjective  - POD #5  Had significant pain in his great toe last night.  He received additional morphine.  This appeared to help, along with massage of his toe.  He is more comfortable this morning   Physical Exam:  Positive Doppler signals Incisions healing nicely. Unchanged right great toe       Assessment/Plan:  POD #5  The patient will try with oral narcotics today with supplemental IV morphine.  He would like to go home tomorrow for his birthday but understands without good pain control this may not be possible. His renal function appears to be stable.  We will plan for permanent access once he has recovered from his bypass surgery, likely as an outpatient.  Lorijean Husser IV, V. WELLS 08/06/2013 11:26 AM --  Filed Vitals:   07/08/2013 0421  BP: 147/64  Pulse: 90  Temp: 98.3 F (36.8 C)  Resp: 18    Intake/Output Summary (Last 24 hours) at 08/07/2013 1126 Last data filed at 07/16/2013 0527  Gross per 24 hour  Intake    240 ml  Output    600 ml  Net   -360 ml     Laboratory CBC    Component Value Date/Time   WBC 13.8* 07/21/2013 0627   HGB 7.6* 07/21/2013 0627   HCT 22.8* 07/21/2013 0627   PLT 489* 07/21/2013 0627    BMET    Component Value Date/Time   NA 136* 08/03/2013 0654   K 4.3 08/03/2013 0654   CL 96 07/16/2013 0654   CO2 21 07/10/2013 0654   GLUCOSE 105* 07/10/2013 0654   BUN 58* 07/14/2013 0654   CREATININE 5.30* 07/10/2013 0654   CALCIUM 9.0 08/05/2013 0654   GFRNONAA 12* 07/25/2013 0654   GFRAA 14* 07/25/2013 0654    COAG Lab Results  Component Value Date   INR 1.07 07/17/2013   INR 0.9 01/02/2007   No results found for this basename: PTT    Antibiotics Anti-infectives   Start     Dose/Rate Route Frequency Ordered Stop   07/17/13 2000  ciprofloxacin (CIPRO) tablet 500 mg  Status:  Discontinued     500 mg Oral 2 times daily 07/17/13 1843 07/17/13 1845   07/17/13 2000  metroNIDAZOLE (FLAGYL) tablet 500 mg     500 mg Oral 3 times daily 07/17/13  1843     07/17/13 2000  ciprofloxacin (CIPRO) tablet 500 mg     500 mg Oral Daily 07/17/13 1850     07/17/13 1845  cefUROXime (ZINACEF) 1.5 g in dextrose 5 % 50 mL IVPB     1.5 g 100 mL/hr over 30 Minutes Intravenous Every 12 hours 07/17/13 1843 07/18/13 0628   07/16/13 1442  cefUROXime (ZINACEF) 1.5 g in dextrose 5 % 50 mL IVPB     1.5 g 100 mL/hr over 30 Minutes Intravenous 30 min pre-op 07/16/13 1442 07/17/13 0801       V. Charlena Cross, M.D. [facility] Office: [phone] Pager:  [phone]

## 2013-07-23 LAB — RENAL FUNCTION PANEL
Albumin: 2.6 g/dL — ABNORMAL LOW (ref 3.5–5.2)
BUN: 60 mg/dL — AB (ref 6–23)
CALCIUM: 9.1 mg/dL (ref 8.4–10.5)
CHLORIDE: 93 meq/L — AB (ref 96–112)
CO2: 25 mEq/L (ref 19–32)
CREATININE: 4.83 mg/dL — AB (ref 0.50–1.35)
GFR calc Af Amer: 16 mL/min — ABNORMAL LOW (ref >90)
GFR calc non Af Amer: 13 mL/min — ABNORMAL LOW (ref >90)
Glucose, Bld: 209 mg/dL — ABNORMAL HIGH (ref 70–99)
Phosphorus: 5.2 mg/dL — ABNORMAL HIGH (ref 2.3–4.6)
Potassium: 4.3 mEq/L (ref 3.7–5.3)
Sodium: 133 mEq/L — ABNORMAL LOW (ref 137–147)

## 2013-07-23 LAB — GLUCOSE, CAPILLARY: Glucose-Capillary: 132 mg/dL — ABNORMAL HIGH (ref 70–99)

## 2013-07-23 MED ORDER — OXYCODONE-ACETAMINOPHEN 5-325 MG PO TABS: 1.0000 | ORAL_TABLET | Freq: Four times a day (QID) | ORAL | Status: DC | PRN

## 2013-07-23 MED ORDER — CALCITRIOL 0.25 MCG PO CAPS: 0.2500 ug | ORAL_CAPSULE | Freq: Every day | ORAL | Status: DC

## 2013-07-23 NOTE — Progress Notes (Signed)
Patient ID: [name], male   DOB: 10/24/1970, 43 y.o.   MRN: [id number]   [facility] Progress Note    Assessment/ Plan:   1. SP Rt fem-pop: Improving slowly per vascular surgery, plans for discharge noted. 2. CKD 5: Discussed with patient/vascular surgery-plans for permanent dialysis access as an outpatient noted after the patient has recovered from his current surgery. No acute electrolyte issues of volume concerns noted at this time, agree with discharge on furosemide 80 mg twice a day. 3. Anemia, received IV iron and on Aranesp-slight down trend of hemoglobin noted, will need continued Procrit therapy as an outpatient. 4. HTN: Fair blood pressures on amlodipine, carvedilol and furosemide-reiterated sodium restriction. Blood pressure slightly elevated on account of pain/discomfort postoperatively. 5. Hyperkalemia, potassium levels improved-reminded patient on low potassium diet 6. Sec HPTH: Elevated phosphorus levels, on calcium acetate for phosphate binding. On calcitriol for suppression of PTH (176 on 07/19/13)  Renal followup arranged and updated on discharge note/instructions.   Subjective:   Reports to be feeling better and inquires about going home. Birthday today.   Objective:   BP 159/47  Pulse 85  Temp(Src) 98.4 F (36.9 C) (Oral)  Resp 20  Ht 5' 8.9" (1.75 m)  Wt 100.1 kg (220 lb 10.9 oz)  BMI 32.69 kg/m2  SpO2 96%  Intake/Output Summary (Last 24 hours) at 07/23/13 6659 Last data filed at 07/23/13 0600  Gross per 24 hour  Intake    360 ml  Output   1325 ml  Net   -965 ml   Weight change:   Physical Exam: Gen: Comfortably sitting on the side of his bed CVS: Pulse regular in rate and rhythm, S1 and S2 with an ejection systolic murmur Resp: Clear bilaterally-no rales/rhonchi Abd: Soft, obese, nontender and bowel sounds are normal Ext: Asymmetric pedal edema-2+ over the right, trace to 1+ over the left lower extremity. Ischemic toes of the right  foot.  Imaging: No results found.  Labs: BMET  Recent Labs Lab 07/18/13 0500 07/19/13 0248 07/19/13 0655 07/19/13 1305 07/20/13 0601 07/20/13 1340 07/21/13 0627 Aug 10, 2013 0654  NA 137 135* 132* 135* 137 138 140 136*  K 5.4* 6.9* 5.9* 5.4* 5.7* 5.1 4.8 4.3  CL 105 102 101 101 100 100 100 96  CO2 22 20 19 20 20 22 22 21   GLUCOSE 104* 126* 137* 124* 127* 170* 110* 105*  BUN 52* 61* 58* 58* 61* 59* 57* 58*  CREATININE 6.16* 6.80* 6.68* 6.74* 6.55* 6.23* 5.66* 5.30*  CALCIUM 8.2* 8.4 8.5 8.9 9.0 9.0 9.6 9.0  PHOS  --  5.1*  --   --  6.4*  --  6.7* 6.2*   CBC  Recent Labs Lab 07/17/13 0617 07/18/13 0500 07/20/13 0730 07/21/13 0627  WBC  --  10.9* 13.2* 13.8*  HGB 10.5* 8.3* 8.3* 7.6*  HCT 31.0* 24.9* 25.1* 22.8*  MCV  --  89.2 90.6 89.8  PLT  --  382 414* 489*   Medications:    . amLODipine  5 mg Oral Daily  . aspirin  81 mg Oral Daily  . calcitRIOL  0.25 mcg Oral Daily  . calcium acetate  667 mg Oral TID WC  . carvedilol  25 mg Oral BID WC  . ciprofloxacin  500 mg Oral Daily  . darbepoetin (ARANESP) injection - NON-DIALYSIS  100 mcg Subcutaneous Q Sun-1800  . docusate sodium  100 mg Oral Daily  . furosemide  80 mg Oral BID  . heparin  5,000 Units Subcutaneous 3 times per day  . insulin aspart  0-15 Units Subcutaneous TID WC  . metroNIDAZOLE  500 mg Oral TID  . omega-3 acid ethyl esters  1 g Oral Daily  . pantoprazole  40 mg Oral Daily   Zetta BillsJay Alezander Dimaano, MD 07/23/2013, 9:03 AM

## 2013-07-23 NOTE — Discharge Summary (Signed)
[facility] Discharge Summary   Patient ID:  [name] MRN: [id number] DOB/AGE: 10/29/1970 43 y.o.  Admit date: 07/17/2013 Discharge date: 07/23/2013 Date of Surgery: 07/17/2013 Surgeon: Surgeon(s): Sherren Kerns, MD  Admission Diagnosis: Peripheral Arterial Disease Right Foot Ischemia;INSTAGE OF RENAL DISEASE  Discharge Diagnoses:  Peripheral Arterial Disease Right Foot Ischemia;INSTAGE OF RENAL DISEASE  Secondary Diagnoses: Past Medical History  Diagnosis Date  . Essential hypertension, benign   . Mixed hyperlipidemia   . Coronary atherosclerosis of native coronary artery     100% apical LAD and distal OM1, Rx medically - 11/13  . Ischemic cardiomyopathy     LVEF 40-45%  . RBBB   . PAD (peripheral artery disease)     Normal ABIs, 2011; focal left EIA stenosis; mild bilateral distal SFA disease, 2008  . CHF (congestive heart failure)   . Anginal pain   . Type 2 diabetes mellitus   . History of blood transfusion 1988    "arm went thru glass window" (10/12/2012)  . CKD (chronic kidney disease) stage 3, GFR 30-59 ml/min   . Gouty arthritis   . Depression   . NSTEMI (non-ST elevated myocardial infarction) 02/2012 and 10/2012  . Shortness of breath     when he has fluid he gets sob    Procedure(s): BYPASS GRAFT FEMORAL-POPLITEAL ARTERY-RIGHT ENDARTERECTOMY FEMORAL WITH PROFUNDAPLOASTY PATCH ANGIOPLASTY OF COMMON FEMORAL AND PROFUNDA USING SEGMENT OF GREATER SAPHENOUS VEIN   Discharged Condition: good  HPI:  43 y/o male that presented with chief complaint of right great toe pain.  He had angiogram on 07/11/2013 by Dr. Imogene Burn.  The findings showed he has combined distal superficial femoral artery disease and tibial artery disease with possibly inadequate posterior tibial artery target distal.  He had surgery by Dr. Leonette Most fields for right femoral to below knee popliteal bypass with Propaten, common femoral and external iliac endarterectomy, extended  profundaplasty.  He has known chronic kidney disease which is being followed by Dr. Dyke Maes.  His main issue has been pain.  He was placed on a PCA, then PO percocet 1-2 q 4-6 hours.  ABI's performed on 07/20/2013 showed right 0.70 and left 0.84.  Complications during his stay include: Anemia, received IV iron, Hyperkalemia treated with Kayexalate, and DM  SSI and PO Glipizide.  Calcitrol was started for Sec HPTH.  He has a follow up with DR. Powell after D/C home.  Chronic kidney disease follow up visit to plan permanent access at his post-op visit with Dr. Darrick Penna.  Hyperkalemia, potassium levels improved-reminded patient on low potassium diet.     Hospital Course:  BRAILEN MACNEAL is a 43 y.o. male is S/P Right Procedure(s): BYPASS GRAFT FEMORAL-POPLITEAL ARTERY-RIGHT ENDARTERECTOMY FEMORAL WITH PROFUNDAPLOASTY PATCH ANGIOPLASTY OF COMMON FEMORAL AND PROFUNDA USING SEGMENT OF GREATER SAPHENOUS VEIN  Extubated: POD # 0 Physical exam: Right foot warm doppler DP/PT signals  No change in great toe appearance Awaiting for the foot to declare itself. Post-op wounds healing well Pt. Ambulating, voiding and taking PO diet without difficulty. Pt pain controlled with PO pain meds. Labs as below Complications:see HPI  Consults:  Treatment Team:  Dyke Maes, MD  Significant Diagnostic Studies: CBC Lab Results  Component Value Date   WBC 13.8* 07/21/2013   HGB 7.6* 07/21/2013   HCT 22.8* 07/21/2013   MCV 89.8 07/21/2013   PLT 489* 07/21/2013    BMET    Component Value Date/Time   NA 133* 07/23/2013 0900  K 4.3 07/23/2013 0900   CL 93* 07/23/2013 0900   CO2 25 07/23/2013 0900   GLUCOSE 209* 07/23/2013 0900   BUN 60* 07/23/2013 0900   CREATININE 4.83* 07/23/2013 0900   CALCIUM 9.1 07/23/2013 0900   GFRNONAA 13* 07/23/2013 0900   GFRAA 16* 07/23/2013 0900   COAG Lab Results  Component Value Date   INR 1.07 07/17/2013   INR 0.9 01/02/2007     Disposition:  Discharge to  :Home Discharge Orders   Future Appointments Provider Department Dept Phone   09/12/2013 11:00 AM [facility] [phone]   09/28/2013 10:00 AM Antoine PocheJonathan F Branch, MD [facility] [phone]   Future Orders Complete By Expires   Call MD for:  redness, tenderness, or signs of infection (pain, swelling, bleeding, redness, odor or green/yellow discharge around incision site)  As directed    Call MD for:  severe or increased pain, loss or decreased feeling  in affected limb(s)  As directed    Call MD for:  temperature >100.5  As directed    Discharge instructions  As directed    Comments:     May shower as needed   Driving Restrictions  As directed    Comments:     No driving for 2 weeks   Increase activity slowly  As directed    Comments:     Walk with assistance use walker or cane as needed   Lifting restrictions  As directed    Comments:     No lifting for 6 weeks   May shower   As directed    Resume previous diet  As directed        Medication List         acetaminophen 500 MG tablet  Commonly known as:  TYLENOL  Take 500 mg by mouth every 6 (six) hours as needed. For pain     amLODipine 5 MG tablet  Commonly known as:  NORVASC  Take 5 mg by mouth daily.     aspirin 81 MG chewable tablet  Chew 81 mg by mouth daily.     calcitRIOL 0.25 MCG capsule  Commonly known as:  ROCALTROL  Take 1 capsule (0.25 mcg total) by mouth daily.     carvedilol 12.5 MG tablet  Commonly known as:  COREG  Take 25 mg by mouth 2 (two) times daily with a meal.     cholecalciferol 400 UNITS Tabs tablet  Commonly known as:  VITAMIN D  Take 400 Units by mouth daily.     ciprofloxacin 500 MG tablet  Commonly known as:  CIPRO  Take 500 mg by mouth 2 (two) times daily. Pt on the 2 day of a 10 day course meds started 07/07/13     fish oil-omega-3 fatty acids 1000 MG capsule  Take 1 g by mouth daily.     furosemide 40 MG  tablet  Commonly known as:  LASIX  Take 40 mg by mouth 2 (two) times daily.     glipiZIDE 5 MG tablet  Commonly known as:  GLUCOTROL  Take 1 tablet (5 mg total) by mouth daily.     HYDROmorphone 2 MG tablet  Commonly known as:  DILAUDID  Take 1 tablet (2 mg total) by mouth every 4 (four) hours as needed for severe pain.     lovastatin 20 MG tablet  Commonly known as:  MEVACOR  Take two tablets by mouth every night.  metoCLOPramide 10 MG tablet  Commonly known as:  REGLAN  Take 1 tablet (10 mg total) by mouth every 6 (six) hours as needed for nausea.     metroNIDAZOLE 500 MG tablet  Commonly known as:  FLAGYL  Take 1 tablet (500 mg total) by mouth 3 (three) times daily.     nitroGLYCERIN 0.4 MG SL tablet  Commonly known as:  NITROSTAT  Place 1 tablet (0.4 mg total) under the tongue every 5 (five) minutes as needed for chest pain.     ondansetron 4 MG tablet  Commonly known as:  ZOFRAN  Take 4 mg by mouth every 8 (eight) hours as needed for nausea or vomiting.     oxyCODONE-acetaminophen 10-325 MG per tablet  Commonly known as:  PERCOCET  Take 1 tablet by mouth every 6 (six) hours as needed for pain.     oxyCODONE-acetaminophen 5-325 MG per tablet  Commonly known as:  PERCOCET/ROXICET  Take 1-2 tablets by mouth every 6 (six) hours as needed for moderate pain.     pravastatin 40 MG tablet  Commonly known as:  PRAVACHOL  Take 40 mg by mouth at bedtime.       Verbal and written Discharge instructions given to the patient. Wound care per Discharge AVS     Follow-up Information   Follow up with Lauris Poag, MD On 08/09/2013. (at 10AM-----labs to be done 1 week prior to appointment)    Specialty:  Nephrology   Contact information:   [address]                          [place] [address] [phone]      F/U with Dr. Darrick Penna our office will call  Signed: Clinton Gallant [facility] 07/23/2013, 12:53 PM  - For VQI Registry use --- Instructions: Press F2 to  tab through selections.  Delete question if not applicable.   Post-op:  Wound infection: No  Graft infection: No  Transfusion: No  If yes, 0 units given New Arrhythmia: No Ipsilateral amputation: [ ]  no, [ ]  Minor, [ ]  BKA, [ ]  AKA Discharge patency: [ ]  Primary, [ ]  Primary assisted, [ ]  Secondary, [ ]  Occluded Patency judged by: [x ] Dopper only, [ ]  Palpable graft pulse, [ ]  Palpable distal pulse, [ ]  ABI inc. > 0.15, [ ]  Duplex Discharge ABI: R 0.70, L 0.84  D/C Ambulatory Status: Ambulatory  Complications: MI: [x ] No, [ ]  Troponin only, [ ]  EKG or Clinical CHF: No Resp failure: [x ] none, [ ]  Pneumonia, [ ]  Ventilator Chg in renal function: [x ] none, [ ]  Inc. Cr > 0.5, [ ]  Temp. Dialysis, [ ]  Permanent dialysis Stroke: [x ] None, [ ]  Minor, [ ]  Major Return to OR: No  Reason for return to OR: [ ]  Bleeding, [ ]  Infection, [ ]  Thrombosis, [ ]  Revision  Discharge medications: Statin use:  Yes ASA use:  Yes Plavix use:  No  for medical reason   Beta blocker use: Yes Coumadin use: No  for medical reason

## 2013-07-23 NOTE — Progress Notes (Addendum)
[facility] [place]  Subjective  - States pain is well controlled on PO meds   Objective 159/47 85 98.4 F (36.9 C) (Oral) 20 96%  Intake/Output Summary (Last 24 hours) at 07/23/13 0751 Last data filed at 07/23/13 0600  Gross per 24 hour  Intake    600 ml  Output   1325 ml  Net   -725 ml    Right foot warm doppler DP/PT signals No change in great toe appearance.    Assessment/Planning: POD #6 s/p Right femoral to below knee popliteal bypass with Propaten, common femoral and external iliac endarterectomy, extended profundaplasty D/C home today on PO meds for pain control. F/U in the office in 2 weeks  Thomasena Edis, EMMA [facility] 07/23/2013 7:51 AM --   Incisions healing, toe pain improved.  Will d/c home today.  If pain in toe persists will need amputation of this  Fabienne Bruns, MD [facility] [place] Office: [phone] Pager: [phone]  Laboratory Lab Results:  Recent Labs  07/21/13 0627  WBC 13.8*  HGB 7.6*  HCT 22.8*  PLT 489*   BMET  Recent Labs  07/21/13 0627 07/16/2013 0654  NA 140 136*  K 4.8 4.3  CL 100 96  CO2 22 21  GLUCOSE 110* 105*  BUN 57* 58*  CREATININE 5.66* 5.30*  CALCIUM 9.6 9.0    COAG Lab Results  Component Value Date   INR 1.07 07/17/2013   INR 0.9 01/02/2007   No results found for this basename: PTT

## 2013-07-24 ENCOUNTER — Telehealth: Payer: Self-pay

## 2013-07-24 NOTE — Telephone Encounter (Signed)
Phone call from pt.  Reports has "burning" in his right foot.  Stated his foot doctor stated "it could be Diabetic Neuropathy, and there are medications he could be prescribed to help."  Advised to call Dr. Darrick Penna.  Reported his incisional area are intact.  Stated there is "slight redness" in the right groin incision;denies drainage from right groin.  Reported his incision below the right knee has been draining a "clear" fluid.  Denies fevers/ chills.  Advised will discuss question about medication for neuropathy with Dr. Darrick Penna, and return call to pt.  Agrees with plan.

## 2013-07-25 NOTE — Telephone Encounter (Signed)
Rec'd response from Dr. Darrick Penna;  "[name] can see his primary MD or his nephrologist to talk about neuropathy meds. If his foot is cool or changing we can see him to make sure his bypass is working."  Phone call to pt. Approx. 1:30 PM today.  Informed him that he will need to call his PCP or his Nephrologist to request medication for the burning in his (R) foot.   Advised pt. that if he has problems with change in color, or a cooler temperature in his right leg/foot, to call Dr. Darrick Penna office for further evaluation.  Verb. Understanding.

## 2013-07-30 ENCOUNTER — Telehealth: Payer: Self-pay | Admitting: *Deleted

## 2013-07-30 ENCOUNTER — Telehealth: Payer: Self-pay | Admitting: Vascular Surgery

## 2013-07-30 NOTE — Telephone Encounter (Signed)
Message copied by Fredrich Birks on Mon Jul 30, 2013 11:54 AM ------      Message from: Lorin Mercy K      Created: Mon Jul 30, 2013 11:27 AM      Regarding: RE: Follow up       I would bring him back in soon. He has been calling in recently to triage      ----- Message -----         From: Fredrich Birks         Sent: 07/30/2013  11:22 AM           To: Sharee Pimple, CMA      Subject: Follow up                                                OK- So it looks like Marisue Humble discharged this patient stating "Our office will call" to schedule an appointment. No time frame noted. I did not receive a staff message on this guy either.            Can you help me?            Thanks,      Annabelle Harman       ------

## 2013-07-30 NOTE — Telephone Encounter (Signed)
Dorcas Carrow (RN case manager from [facility]) states she has been unable to reach Chari Manning via cell phone and she is reaching out to get a message for him to call her at [phone] ext. [phone].  She states she has mailed him a letter and has tried repeatedly to reach him by cell phone.  She states she needs to close his file by 08-13-2013 unless she hears back from him.  Called Kelly Harnisch's grandmother at [phone] where he has been staying and called Dorcas Carrow with this phone number to help them connect with one another.

## 2013-08-01 ENCOUNTER — Encounter: Payer: Self-pay | Admitting: Vascular Surgery

## 2013-08-02 ENCOUNTER — Encounter: Payer: Self-pay | Admitting: Vascular Surgery

## 2013-08-02 ENCOUNTER — Ambulatory Visit (INDEPENDENT_AMBULATORY_CARE_PROVIDER_SITE_OTHER): Payer: PRIVATE HEALTH INSURANCE | Admitting: Vascular Surgery

## 2013-08-02 VITALS — BP 118/63 | HR 94 | Ht 68.9 in | Wt 217.8 lb

## 2013-08-02 DIAGNOSIS — I739 Peripheral vascular disease, unspecified: Secondary | ICD-10-CM

## 2013-08-02 NOTE — Progress Notes (Signed)
Patient is a 43 year old male who underwent right femoral to below-knee popliteal bypass with propaten as well as femoral endarterectomy and extended profundoplasty on March 10. He returns today for followup. He has gangrenous changes to his right first toe. He reports some drainage from his right groin incision. He has had no fever or chills. However he has developed a flapping jerking type motion in his upper and lower extremities. He also has significant edema in the right lower extremity. He denies rest pain although he states the first toe still is occasionally painful. He also still has some continued pain in his groin incision.  Physical exam:  Filed Vitals:   08/02/13 1127  BP: 118/63  Pulse: 94  Height: 5' 8.9" (1.75 m)  Weight: 217 lb 12.8 oz (98.793 kg)  SpO2: 99%    Neuro: Flapping upper extremities at risk intermittently Right lower extremity: Some maceration of his right groin incision no significant surrounding erythema small hematoma right midthigh, biphasic to triphasic posterior tibial Doppler flow right foot monophasic peroneal Doppler flow all other leg incisions healing.  Every other staple was removed today.  Assessment: Slowly improving right lower extremity post femoropopliteal bypass with endarterectomy and profundoplasty slowly healing right lower extremity wounds with dry gangrene right first toe. The patient also has severe known chronic renal dysfunction and I believe his flapping risks are most likely asterixis or related to combination of narcotic if his underlying renal dysfunction  Plan: Followup with me in 2 weeks to reassess wounds in the right lower extremity. He will eventually need a permanent dialysis access; however, I'm not going to place this until his right lower extremity is completely healed. If he requires dialysis prior to this he will need a Diatek catheter. We will try to make an earlier appointment for him with his nephrologist.  Fabienne Bruns,  MD [facility] Office: [phone] Pager: [phone]

## 2013-08-06 ENCOUNTER — Telehealth: Payer: Self-pay

## 2013-08-06 NOTE — Telephone Encounter (Signed)
Phone call from pt.  Reported increased yellow drainage from right groin wound, since seen in office on 08/02/13.  Wife spoke with nurse at this time.  Reported there had been firmness in the right upper, inner thigh, which has softened somewhat, since the increased drainage from the groin.  Describes the wound bed as having "white and yellow tissue."  Denies fever/chills.  Will schedule appt. With Nurse Practitioner this week ; pt. Has appt. On Thursday, 4/2 with Nephrologist, so unable to come on day that Dr. Darrick Penna is available.  Will schedule appt. 08/08/13 for right groin wound check.  Agrees w/ plan.

## 2013-08-07 ENCOUNTER — Encounter: Payer: Self-pay | Admitting: Family

## 2013-08-08 ENCOUNTER — Encounter (HOSPITAL_COMMUNITY): Payer: Self-pay | Admitting: Emergency Medicine

## 2013-08-08 ENCOUNTER — Ambulatory Visit: Payer: 59 | Admitting: Family

## 2013-08-08 ENCOUNTER — Inpatient Hospital Stay (HOSPITAL_COMMUNITY)
Admission: EM | Admit: 2013-08-08 | Discharge: 2013-08-14 | DRG: 603 | Disposition: A | Discharge status: Home / Self Care | Payer: PRIVATE HEALTH INSURANCE | Attending: Vascular Surgery | Admitting: Vascular Surgery

## 2013-08-08 ENCOUNTER — Emergency Department (HOSPITAL_COMMUNITY): Payer: PRIVATE HEALTH INSURANCE

## 2013-08-08 DIAGNOSIS — L02419 Cutaneous abscess of limb, unspecified: Principal | ICD-10-CM | POA: Diagnosis present

## 2013-08-08 DIAGNOSIS — Z8249 Family history of ischemic heart disease and other diseases of the circulatory system: Secondary | ICD-10-CM

## 2013-08-08 DIAGNOSIS — N183 Chronic kidney disease, stage 3 unspecified: Secondary | ICD-10-CM

## 2013-08-08 DIAGNOSIS — T8140XA Infection following a procedure, unspecified, initial encounter: Secondary | ICD-10-CM

## 2013-08-08 DIAGNOSIS — L0390 Cellulitis, unspecified: Secondary | ICD-10-CM | POA: Diagnosis present

## 2013-08-08 DIAGNOSIS — L03119 Cellulitis of unspecified part of limb: Principal | ICD-10-CM

## 2013-08-08 DIAGNOSIS — N185 Chronic kidney disease, stage 5: Secondary | ICD-10-CM | POA: Diagnosis present

## 2013-08-08 DIAGNOSIS — I2510 Atherosclerotic heart disease of native coronary artery without angina pectoris: Secondary | ICD-10-CM | POA: Diagnosis present

## 2013-08-08 DIAGNOSIS — I2582 Chronic total occlusion of coronary artery: Secondary | ICD-10-CM | POA: Diagnosis present

## 2013-08-08 DIAGNOSIS — Z23 Encounter for immunization: Secondary | ICD-10-CM

## 2013-08-08 DIAGNOSIS — F1210 Cannabis abuse, uncomplicated: Secondary | ICD-10-CM | POA: Diagnosis present

## 2013-08-08 DIAGNOSIS — F3289 Other specified depressive episodes: Secondary | ICD-10-CM | POA: Diagnosis present

## 2013-08-08 DIAGNOSIS — I120 Hypertensive chronic kidney disease with stage 5 chronic kidney disease or end stage renal disease: Secondary | ICD-10-CM | POA: Diagnosis present

## 2013-08-08 DIAGNOSIS — D649 Anemia, unspecified: Secondary | ICD-10-CM | POA: Diagnosis present

## 2013-08-08 DIAGNOSIS — M7989 Other specified soft tissue disorders: Secondary | ICD-10-CM

## 2013-08-08 DIAGNOSIS — I2589 Other forms of chronic ischemic heart disease: Secondary | ICD-10-CM | POA: Diagnosis present

## 2013-08-08 DIAGNOSIS — Z888 Allergy status to other drugs, medicaments and biological substances status: Secondary | ICD-10-CM

## 2013-08-08 DIAGNOSIS — M109 Gout, unspecified: Secondary | ICD-10-CM | POA: Diagnosis present

## 2013-08-08 DIAGNOSIS — E875 Hyperkalemia: Secondary | ICD-10-CM

## 2013-08-08 DIAGNOSIS — E782 Mixed hyperlipidemia: Secondary | ICD-10-CM | POA: Diagnosis present

## 2013-08-08 DIAGNOSIS — I252 Old myocardial infarction: Secondary | ICD-10-CM

## 2013-08-08 DIAGNOSIS — I898 Other specified noninfective disorders of lymphatic vessels and lymph nodes: Secondary | ICD-10-CM | POA: Diagnosis present

## 2013-08-08 DIAGNOSIS — Z87891 Personal history of nicotine dependence: Secondary | ICD-10-CM

## 2013-08-08 DIAGNOSIS — E1159 Type 2 diabetes mellitus with other circulatory complications: Secondary | ICD-10-CM | POA: Diagnosis present

## 2013-08-08 DIAGNOSIS — I70269 Atherosclerosis of native arteries of extremities with gangrene, unspecified extremity: Secondary | ICD-10-CM | POA: Diagnosis present

## 2013-08-08 DIAGNOSIS — F1410 Cocaine abuse, uncomplicated: Secondary | ICD-10-CM | POA: Diagnosis present

## 2013-08-08 DIAGNOSIS — L97509 Non-pressure chronic ulcer of other part of unspecified foot with unspecified severity: Secondary | ICD-10-CM | POA: Diagnosis present

## 2013-08-08 DIAGNOSIS — I509 Heart failure, unspecified: Secondary | ICD-10-CM | POA: Diagnosis present

## 2013-08-08 DIAGNOSIS — N19 Unspecified kidney failure: Secondary | ICD-10-CM

## 2013-08-08 DIAGNOSIS — F329 Major depressive disorder, single episode, unspecified: Secondary | ICD-10-CM | POA: Diagnosis present

## 2013-08-08 LAB — I-STAT CHEM 8, ED
BUN: 94 mg/dL — ABNORMAL HIGH (ref 6–23)
Calcium, Ion: 1.15 mmol/L (ref 1.12–1.23)
Chloride: 101 mEq/L (ref 96–112)
Creatinine, Ser: 6.4 mg/dL — ABNORMAL HIGH (ref 0.50–1.35)
GLUCOSE: 218 mg/dL — AB (ref 70–99)
HEMATOCRIT: 28 % — AB (ref 39.0–52.0)
HEMOGLOBIN: 9.5 g/dL — AB (ref 13.0–17.0)
POTASSIUM: 5.9 meq/L — AB (ref 3.7–5.3)
SODIUM: 132 meq/L — AB (ref 137–147)
TCO2: 20 mmol/L (ref 0–100)

## 2013-08-08 LAB — COMPREHENSIVE METABOLIC PANEL
ALK PHOS: 57 U/L (ref 39–117)
ALT: 10 U/L (ref 0–53)
ALT: 9 U/L (ref 0–53)
AST: 11 U/L (ref 0–37)
AST: 11 U/L (ref 0–37)
Albumin: 2.3 g/dL — ABNORMAL LOW (ref 3.5–5.2)
Albumin: 2.3 g/dL — ABNORMAL LOW (ref 3.5–5.2)
Alkaline Phosphatase: 63 U/L (ref 39–117)
BUN: 77 mg/dL — AB (ref 6–23)
BUN: 83 mg/dL — AB (ref 6–23)
CALCIUM: 9 mg/dL (ref 8.4–10.5)
CHLORIDE: 96 meq/L (ref 96–112)
CO2: 18 meq/L — AB (ref 19–32)
CO2: 20 mEq/L (ref 19–32)
CREATININE: 5.62 mg/dL — AB (ref 0.50–1.35)
Calcium: 8.6 mg/dL (ref 8.4–10.5)
Chloride: 97 mEq/L (ref 96–112)
Creatinine, Ser: 5.61 mg/dL — ABNORMAL HIGH (ref 0.50–1.35)
GFR calc non Af Amer: 11 mL/min — ABNORMAL LOW (ref >90)
GFR, EST AFRICAN AMERICAN: 13 mL/min — AB (ref >90)
GFR, EST AFRICAN AMERICAN: 13 mL/min — AB (ref >90)
GFR, EST NON AFRICAN AMERICAN: 11 mL/min — AB (ref >90)
GLUCOSE: 223 mg/dL — AB (ref 70–99)
Glucose, Bld: 211 mg/dL — ABNORMAL HIGH (ref 70–99)
POTASSIUM: 5.9 meq/L — AB (ref 3.7–5.3)
Potassium: 6.1 mEq/L — ABNORMAL HIGH (ref 3.7–5.3)
Sodium: 132 mEq/L — ABNORMAL LOW (ref 137–147)
Sodium: 133 mEq/L — ABNORMAL LOW (ref 137–147)
Total Bilirubin: 0.2 mg/dL — ABNORMAL LOW (ref 0.3–1.2)
Total Protein: 6.8 g/dL (ref 6.0–8.3)
Total Protein: 6.7 g/dL (ref 6.0–8.3)

## 2013-08-08 LAB — CBC WITH DIFFERENTIAL/PLATELET
Basophils Absolute: 0 10*3/uL (ref 0.0–0.1)
Basophils Relative: 0 % (ref 0–1)
Eosinophils Absolute: 0.3 10*3/uL (ref 0.0–0.7)
Eosinophils Relative: 2 % (ref 0–5)
HEMATOCRIT: 26.8 % — AB (ref 39.0–52.0)
HEMOGLOBIN: 8.8 g/dL — AB (ref 13.0–17.0)
LYMPHS ABS: 2.1 10*3/uL (ref 0.7–4.0)
LYMPHS PCT: 14 % (ref 12–46)
MCH: 29.8 pg (ref 26.0–34.0)
MCHC: 32.8 g/dL (ref 30.0–36.0)
MCV: 90.8 fL (ref 78.0–100.0)
MONO ABS: 1.1 10*3/uL — AB (ref 0.1–1.0)
MONOS PCT: 7 % (ref 3–12)
Neutro Abs: 12.4 10*3/uL — ABNORMAL HIGH (ref 1.7–7.7)
Neutrophils Relative %: 77 % (ref 43–77)
Platelets: 453 10*3/uL — ABNORMAL HIGH (ref 150–400)
RBC: 2.95 MIL/uL — ABNORMAL LOW (ref 4.22–5.81)
RDW: 13.3 % (ref 11.5–15.5)
WBC: 15.9 10*3/uL — AB (ref 4.0–10.5)

## 2013-08-08 LAB — CBC
HEMATOCRIT: 26.8 % — AB (ref 39.0–52.0)
HEMOGLOBIN: 9 g/dL — AB (ref 13.0–17.0)
MCH: 30.2 pg (ref 26.0–34.0)
MCHC: 33.6 g/dL (ref 30.0–36.0)
MCV: 89.9 fL (ref 78.0–100.0)
Platelets: 385 10*3/uL (ref 150–400)
RBC: 2.98 MIL/uL — ABNORMAL LOW (ref 4.22–5.81)
RDW: 13.2 % (ref 11.5–15.5)
WBC: 14.3 10*3/uL — ABNORMAL HIGH (ref 4.0–10.5)

## 2013-08-08 LAB — GLUCOSE, CAPILLARY
GLUCOSE-CAPILLARY: 192 mg/dL — AB (ref 70–99)
Glucose-Capillary: 186 mg/dL — ABNORMAL HIGH (ref 70–99)
Glucose-Capillary: 191 mg/dL — ABNORMAL HIGH (ref 70–99)
Glucose-Capillary: 161 mg/dL — ABNORMAL HIGH (ref 70–99)

## 2013-08-08 LAB — BASIC METABOLIC PANEL
BUN: 76 mg/dL — ABNORMAL HIGH (ref 6–23)
CALCIUM: 8.4 mg/dL (ref 8.4–10.5)
CO2: 20 meq/L (ref 19–32)
CREATININE: 5.58 mg/dL — AB (ref 0.50–1.35)
Chloride: 100 mEq/L (ref 96–112)
GFR calc Af Amer: 13 mL/min — ABNORMAL LOW (ref >90)
GFR, EST NON AFRICAN AMERICAN: 11 mL/min — AB (ref >90)
GLUCOSE: 182 mg/dL — AB (ref 70–99)
Potassium: 5.2 mEq/L (ref 3.7–5.3)
SODIUM: 136 meq/L — AB (ref 137–147)

## 2013-08-08 LAB — PROTIME-INR
INR: 1.19 (ref 0.00–1.49)
Prothrombin Time: 14.8 seconds (ref 11.6–15.2)

## 2013-08-08 LAB — I-STAT CG4 LACTIC ACID, ED: Lactic Acid, Venous: 0.89 mmol/L (ref 0.5–2.2)

## 2013-08-08 MED ORDER — GUAIFENESIN-DM 100-10 MG/5ML PO SYRP: 15.0000 mL | ORAL_SOLUTION | ORAL | Status: DC | PRN

## 2013-08-08 MED ORDER — CALCITRIOL 0.25 MCG PO CAPS
0.2500 ug | ORAL_CAPSULE | Freq: Every day | ORAL | Status: DC
  Administered 2013-08-08 – 2013-08-14 (×7): 0.25 ug via ORAL
  Filled 2013-08-08 (×7): qty 1

## 2013-08-08 MED ORDER — FENTANYL CITRATE 0.05 MG/ML IJ SOLN
50.0000 ug | INTRAMUSCULAR | Status: DC | PRN
  Administered 2013-08-08 – 2013-08-13 (×5): 50 ug via INTRAVENOUS
  Filled 2013-08-08 (×5): qty 2

## 2013-08-08 MED ORDER — CARVEDILOL 25 MG PO TABS
25.0000 mg | ORAL_TABLET | Freq: Two times a day (BID) | ORAL | Status: DC
  Administered 2013-08-08 – 2013-08-14 (×13): 25 mg via ORAL
  Filled 2013-08-08 (×15): qty 1

## 2013-08-08 MED ORDER — VANCOMYCIN HCL IN DEXTROSE 750-5 MG/150ML-% IV SOLN
750.0000 mg | INTRAVENOUS | Status: DC
  Administered 2013-08-09: 750 mg via INTRAVENOUS
  Filled 2013-08-08: qty 150

## 2013-08-08 MED ORDER — OMEGA-3-ACID ETHYL ESTERS 1 G PO CAPS
1.0000 g | ORAL_CAPSULE | Freq: Every day | ORAL | Status: DC
  Administered 2013-08-08 – 2013-08-14 (×7): 1 g via ORAL
  Filled 2013-08-08 (×7): qty 1

## 2013-08-08 MED ORDER — SODIUM POLYSTYRENE SULFONATE 15 GM/60ML PO SUSP
15.0000 g | Freq: Once | ORAL | Status: AC
  Administered 2013-08-08: 15 g via ORAL
  Filled 2013-08-08: qty 60

## 2013-08-08 MED ORDER — PANTOPRAZOLE SODIUM 40 MG PO TBEC
40.0000 mg | DELAYED_RELEASE_TABLET | Freq: Every day | ORAL | Status: DC
  Administered 2013-08-08 – 2013-08-14 (×7): 40 mg via ORAL
  Filled 2013-08-08 (×6): qty 1

## 2013-08-08 MED ORDER — PIPERACILLIN-TAZOBACTAM 3.375 G IVPB 30 MIN
3.3750 g | Freq: Once | INTRAVENOUS | Status: AC
  Administered 2013-08-08: 3.375 g via INTRAVENOUS
  Filled 2013-08-08: qty 50

## 2013-08-08 MED ORDER — SODIUM POLYSTYRENE SULFONATE 15 GM/60ML PO SUSP
30.0000 g | Freq: Once | ORAL | Status: AC
  Administered 2013-08-08: 30 g via ORAL
  Filled 2013-08-08: qty 120

## 2013-08-08 MED ORDER — PRAVASTATIN SODIUM 40 MG PO TABS
40.0000 mg | ORAL_TABLET | Freq: Every day | ORAL | Status: DC
  Administered 2013-08-08 – 2013-08-13 (×6): 40 mg via ORAL
  Filled 2013-08-08 (×8): qty 1

## 2013-08-08 MED ORDER — OMEGA-3 FATTY ACIDS 1000 MG PO CAPS: 1.0000 g | ORAL_CAPSULE | Freq: Every day | ORAL | Status: DC

## 2013-08-08 MED ORDER — PIPERACILLIN-TAZOBACTAM 3.375 G IVPB
3.3750 g | Freq: Three times a day (TID) | INTRAVENOUS | Status: DC
  Administered 2013-08-08 – 2013-08-09 (×4): 3.375 g via INTRAVENOUS
  Filled 2013-08-08 (×5): qty 50

## 2013-08-08 MED ORDER — SODIUM BICARBONATE 8.4 % IV SOLN
50.0000 meq | Freq: Once | INTRAVENOUS | Status: AC
  Administered 2013-08-08: 50 meq via INTRAVENOUS
  Filled 2013-08-08 (×2): qty 50

## 2013-08-08 MED ORDER — LABETALOL HCL 5 MG/ML IV SOLN
10.0000 mg | INTRAVENOUS | Status: DC | PRN
  Filled 2013-08-08: qty 4

## 2013-08-08 MED ORDER — PHENOL 1.4 % MT LIQD: 1.0000 | OROMUCOSAL | Status: DC | PRN

## 2013-08-08 MED ORDER — SIMVASTATIN 20 MG PO TABS: 20.0000 mg | ORAL_TABLET | Freq: Every day | ORAL | Status: DC

## 2013-08-08 MED ORDER — HYDRALAZINE HCL 20 MG/ML IJ SOLN: 10.0000 mg | INTRAMUSCULAR | Status: DC | PRN

## 2013-08-08 MED ORDER — RENA-VITE PO TABS
1.0000 | ORAL_TABLET | Freq: Every day | ORAL | Status: DC
  Administered 2013-08-08 – 2013-08-09 (×2): 1 via ORAL
  Administered 2013-08-10: 22:00:00 via ORAL
  Administered 2013-08-11 – 2013-08-13 (×3): 1 via ORAL
  Filled 2013-08-08 (×7): qty 1

## 2013-08-08 MED ORDER — POTASSIUM CHLORIDE CRYS ER 20 MEQ PO TBCR: 20.0000 meq | EXTENDED_RELEASE_TABLET | Freq: Once | ORAL | Status: DC

## 2013-08-08 MED ORDER — ALUM & MAG HYDROXIDE-SIMETH 200-200-20 MG/5ML PO SUSP: 15.0000 mL | ORAL | Status: DC | PRN

## 2013-08-08 MED ORDER — ENOXAPARIN SODIUM 30 MG/0.3ML ~~LOC~~ SOLN
30.0000 mg | SUBCUTANEOUS | Status: DC
  Administered 2013-08-08 – 2013-08-13 (×6): 30 mg via SUBCUTANEOUS
  Filled 2013-08-08 (×7): qty 0.3

## 2013-08-08 MED ORDER — GLIPIZIDE 5 MG PO TABS
5.0000 mg | ORAL_TABLET | Freq: Every day | ORAL | Status: DC
  Administered 2013-08-08 – 2013-08-09 (×2): 5 mg via ORAL
  Filled 2013-08-08 (×3): qty 1

## 2013-08-08 MED ORDER — NITROGLYCERIN 0.4 MG SL SUBL: 0.4000 mg | SUBLINGUAL_TABLET | SUBLINGUAL | Status: DC | PRN

## 2013-08-08 MED ORDER — ONDANSETRON HCL 4 MG/2ML IJ SOLN: 4.0000 mg | Freq: Four times a day (QID) | INTRAMUSCULAR | Status: DC | PRN

## 2013-08-08 MED ORDER — METOPROLOL TARTRATE 1 MG/ML IV SOLN: 2.0000 mg | INTRAVENOUS | Status: DC | PRN

## 2013-08-08 MED ORDER — DARBEPOETIN ALFA-POLYSORBATE 100 MCG/0.5ML IJ SOLN
100.0000 ug | INTRAMUSCULAR | Status: DC
  Administered 2013-08-08: 100 ug via SUBCUTANEOUS
  Filled 2013-08-08: qty 0.5

## 2013-08-08 MED ORDER — OXYCODONE-ACETAMINOPHEN 5-325 MG PO TABS
1.0000 | ORAL_TABLET | Freq: Four times a day (QID) | ORAL | Status: DC | PRN
  Administered 2013-08-08 – 2013-08-14 (×19): 1 via ORAL
  Filled 2013-08-08 (×19): qty 1

## 2013-08-08 MED ORDER — AMLODIPINE BESYLATE 5 MG PO TABS
5.0000 mg | ORAL_TABLET | Freq: Every day | ORAL | Status: DC
  Administered 2013-08-08 – 2013-08-11 (×4): 5 mg via ORAL
  Filled 2013-08-08 (×5): qty 1

## 2013-08-08 MED ORDER — FUROSEMIDE 40 MG PO TABS
40.0000 mg | ORAL_TABLET | Freq: Two times a day (BID) | ORAL | Status: DC
  Administered 2013-08-08 – 2013-08-13 (×12): 40 mg via ORAL
  Filled 2013-08-08 (×16): qty 1

## 2013-08-08 MED ORDER — OXYCODONE-ACETAMINOPHEN 10-325 MG PO TABS: 1.0000 | ORAL_TABLET | Freq: Four times a day (QID) | ORAL | Status: DC | PRN

## 2013-08-08 MED ORDER — ONDANSETRON HCL 4 MG PO TABS: 4.0000 mg | ORAL_TABLET | Freq: Three times a day (TID) | ORAL | Status: DC | PRN

## 2013-08-08 MED ORDER — CHOLECALCIFEROL 10 MCG (400 UNIT) PO TABS
400.0000 [IU] | ORAL_TABLET | Freq: Every day | ORAL | Status: DC
  Administered 2013-08-08 – 2013-08-14 (×7): 400 [IU] via ORAL
  Filled 2013-08-08 (×7): qty 1

## 2013-08-08 MED ORDER — OXYCODONE HCL 5 MG PO TABS
5.0000 mg | ORAL_TABLET | Freq: Four times a day (QID) | ORAL | Status: DC | PRN
  Administered 2013-08-08 – 2013-08-14 (×18): 5 mg via ORAL
  Filled 2013-08-08 (×18): qty 1

## 2013-08-08 MED ORDER — ASPIRIN 81 MG PO CHEW
81.0000 mg | CHEWABLE_TABLET | Freq: Every day | ORAL | Status: DC
  Administered 2013-08-08 – 2013-08-14 (×7): 81 mg via ORAL
  Filled 2013-08-08 (×6): qty 1

## 2013-08-08 MED ORDER — SODIUM BICARBONATE 650 MG PO TABS
650.0000 mg | ORAL_TABLET | Freq: Three times a day (TID) | ORAL | Status: DC
  Administered 2013-08-08 – 2013-08-14 (×18): 650 mg via ORAL
  Filled 2013-08-08 (×20): qty 1

## 2013-08-08 MED ORDER — VANCOMYCIN HCL IN DEXTROSE 1-5 GM/200ML-% IV SOLN
1000.0000 mg | Freq: Once | INTRAVENOUS | Status: AC
  Administered 2013-08-08: 1000 mg via INTRAVENOUS
  Filled 2013-08-08: qty 200

## 2013-08-08 NOTE — ED Notes (Signed)
Pt resting with eyes closed.  Pt's father at bedside.

## 2013-08-08 NOTE — Progress Notes (Signed)
ANTIBIOTIC CONSULT NOTE - INITIAL  Pharmacy Consult for Vancocin and Zosyn Indication: cellulitis  Allergies  Allergen Reactions  . Vytorin [Ezetimibe-Simvastatin]     Weakness, muscle aches bad.  . Oxycodone-Acetaminophen Nausea And Vomiting    Vomiting      Patient Measurements: Weight: 99kg  Vital Signs: Temp: 98.5 F (36.9 C) (04/01 0026) Temp src: Oral (04/01 0026) BP: 134/69 mmHg (04/01 0330) Pulse Rate: 84 (04/01 0330) Intake/Output from previous day: 03/31 0701 - 04/01 0700 In: 50 [I.V.:50] Out: -  Intake/Output from this shift: Total I/O In: 50 [I.V.:50] Out: -   Labs:  Recent Labs  08/08/13 0055 08/08/13 0105  WBC 15.9*  --   HGB 8.8* 9.5*  PLT 453*  --   CREATININE 5.62* 6.40*    Microbiology: Recent Results (from the past 720 hour(s))  MRSA PCR SCREENING     Status: None   Collection Time    07/09/13  6:44 PM      Result Value Ref Range Status   MRSA by PCR NEGATIVE  NEGATIVE Final   Comment:            The GeneXpert MRSA Assay (FDA     approved for NASAL specimens     only), is one component of a     comprehensive MRSA colonization     surveillance program. It is not     intended to diagnose MRSA     infection nor to guide or     monitor treatment for     MRSA infections.    Medical History: Past Medical History  Diagnosis Date  . Essential hypertension, benign   . Mixed hyperlipidemia   . Coronary atherosclerosis of native coronary artery     100% apical LAD and distal OM1, Rx medically - 11/13  . Ischemic cardiomyopathy     LVEF 40-45%  . RBBB   . PAD (peripheral artery disease)     Normal ABIs, 2011; focal left EIA stenosis; mild bilateral distal SFA disease, 2008  . CHF (congestive heart failure)   . Anginal pain   . Type 2 diabetes mellitus   . History of blood transfusion 1988    "arm went thru glass window" (10/12/2012)  . CKD (chronic kidney disease) stage 3, GFR 30-59 ml/min   . Gouty arthritis   . Depression   .  NSTEMI (non-ST elevated myocardial infarction) 02/2012 and 10/2012  . 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  Goal of Therapy:  Vancomycin trough level 10-15 mcg/ml  Plan:  Rec'd vanc 1g and Zosyn 3.375g IV in ED; will continue with vancomycin 750mg  IV Q24H and Zosyn 3.375g IV Q8H and monitor CBC, Cx, levels prn.  Vernard Gambles, PharmD, BCPS  08/08/2013,4:52 AM

## 2013-08-08 NOTE — Consult Note (Addendum)
Requesting Physician:  Dr. Edilia Boickson, [facility] Reason for Consult:  CKD5, hyperkalemia HPI: The patient is a 43 y.o. year-old male with background of DM, HTN,, CAD, ischemic cardiomyopathy, gout.  Known advanced CKD. Followed [facility] past by [facility], but first in contact with our group shen seen in consultation by Dr. Casimiro NeedleAlvin Powell on 07/09/13 during an admission for limb salvage evaluation and wanted to "change to our network".  He recently (3/10) underwent a right fem pop, common femoral and external iliac endarterectomy and profundaplasty on 3/10.  During that hospital admission his creatinine ranged anywhere from 4.8 to 6.5.  There was a discussion about access placement, with the decision to do as outpt once "over that surgery" and he was to have followed up with [facility] Lowell [facility]Powell [facility] on [facility].  Labs done in anticipation of that visit showed a potassium of 6.1 (although the message was that K was 6.9), creatinine of 5.3 - the on call MD asked him to go to the ED for his potassium.  Incidentally he had a WBC of 15,300.    When evaluated in the ED he was found to have right leg swelling and drainage from the right groin - was seen by [facility] who drained a lymphocele in the proximal right thigh, and started vanco and zosyn for the cellulitis in the right groin, in that his repair last month included prosthetic material.  His labs from the ED included K of 5.9, creatinine of 6.4 - no evidence that the potassium was treated on admission - repeat labs this AM K 5.9, creatinine 5.6 and we were called.  He has felt pretty well. Started gabapentin about 2 weeks ago for neuropathy and since developed twitching and muscle jerks, started dropping things; eating OK; no nausea or vomiting; sleeping a little more than usual.  No chest pain, SOB or swelling in the left leg, though had noted more swelling and warmth the right leg.  Creatinine trending is as follows: Creatinine, Ser  Date/Time Value Ref Range Status  08/08/2013  8:55 AM  5.61* 0.50 - 1.35 mg/dL Final  4/7/82954/05/2013  6:211:05 AM 6.40* 0.50 - 1.35 mg/dL Final  3/0/86574/05/2013 84:6912:55 AM 5.62* 0.50 - 1.35 mg/dL Final  6/29/52843/16/2015  1:329:00 AM 4.83* 0.50 - 1.35 mg/dL Final  4/40/10273/15/2015  2:536:54 AM 5.30* 0.50 - 1.35 mg/dL Final  6/64/40343/14/2015  7:426:27 AM 5.66* 0.50 - 1.35 mg/dL Final  5/95/63873/13/2015  5:641:40 PM 6.23* 0.50 - 1.35 mg/dL Final  3/32/95183/13/2015  8:416:01 AM 6.55* 0.50 - 1.35 mg/dL Final  6/60/63013/04/2014  6:011:05 PM 6.74* 0.50 - 1.35 mg/dL Final  0/93/23553/04/2014  7:326:55 AM 6.68* 0.50 - 1.35 mg/dL Final  2/02/54273/04/2014  0:622:48 AM 6.80* 0.50 - 1.35 mg/dL Final  3/76/28313/03/2014  5:175:00 AM 6.16* 0.50 - 1.35 mg/dL Final  6/1/60733/11/2013  7:104:28 AM 4.96* 0.50 - 1.35 mg/dL Final  6/2/69483/10/2013  5:464:02 AM 5.19* 0.50 - 1.35 mg/dL Final  2/7/03503/09/2013 09:3810:00 AM 4.90* 0.50 - 1.35 mg/dL Final  1/8/29933/08/2013  7:162:40 PM 4.86* 0.50 - 1.35 mg/dL Final  9/6/78933/08/2013  8:107:35 AM 4.91* 0.50 - 1.35 mg/dL Final  1/7/51023/07/2013 58:5211:08 PM 5.08* 0.50 - 1.35 mg/dL Final  7/7/82423/07/2013  3:533:05 AM 5.24* 0.50 - 1.35 mg/dL Final  6/1/44313/06/2013 54:0012:10 PM 5.16* 0.50 - 1.35 mg/dL Final  8/6/76193/05/2013 50:9312:07 AM 4.87* 0.50 - 1.35 mg/dL Final  2/6/71246/10/2012  5:803:50 AM 3.04* 0.50 - 1.35 mg/dL Final  99/83/382511/20/2013 05:3910:13 AM 1.91* 0.50 - 1.35 mg/dL Final  76/73/419311/19/2013  7:906:03 AM  1.93* 0.50 - 1.35 mg/dL Final  1/61/0960  4:54 AM 1.22  0.4 - 1.5 mg/dL Final  0/98/1191  4:78 AM 1.52* 0.4 - 1.5 mg/dL Final  2/95/6213  0:86 PM 1.67* 0.4 - 1.5 mg/dL Final  09/14/8467  6:29 PM 1.50  0.4 - 1.5 mg/dL Final  10/05/4130 44:01 PM 1.32   Final  04/29/2007  7:45 PM 1.24   Final  01/02/2007  7:59 AM 1.22   Final  11/15/2006  2:50 PM 1.05   Final    Past Medical History  Diagnosis Date  . Essential hypertension, benign   . Mixed hyperlipidemia   . Coronary atherosclerosis of native coronary artery     100% apical LAD and distal OM1, Rx medically - 11/13  . Ischemic cardiomyopathy     LVEF 40-45%  . RBBB   . PAD (peripheral artery disease)     Normal ABIs, 2011; focal left EIA stenosis; mild bilateral distal SFA disease, 2008  . CHF (congestive heart  failure)   . Anginal pain   . Type 2 diabetes mellitus   . History of blood transfusion 1988    "arm went thru glass window" (10/12/2012)  . CKD (chronic kidney disease) stage 3, GFR 30-59 ml/min   . Gouty arthritis   . Depression   . NSTEMI (non-ST elevated myocardial infarction) 02/2012 and 10/2012  . Shortness of breath     when he has fluid he gets sob    Past Surgical History:  Past Surgical History  Procedure Laterality Date  . Arm surgery Right [phone]    "@ least 3 ORs; took nerves out of my left leg and put them in my arm" (10/12/2012)  . Cardiac catheterization  03/28/12    20% prox and mid LAD, 100% distal LAD, 30% prox OM, 100% distal OM, 40% prox RCA, 40% mid RCA, 25% distal RCA, small PDA w/ 80% diffuse dz, PLB small w/ 50% diffuse stenoses. No focal lesions for PCI. Recommendations for continued medical management and aggressive RF reduction  . Refractive surgery Bilateral ~ 2005  . Eye surgery Left 1990's    "blood vessel ruptured" (10/12/2012)  . Colonoscopy    . Femoral-popliteal bypass graft Right 07/17/2013    Procedure: BYPASS GRAFT FEMORAL-POPLITEAL ARTERY-RIGHT;  Surgeon: Sherren Kerns, MD;  Location[facility] OR;  Service: Vascular;  Laterality: Right;  . Endarterectomy femoral Right 07/17/2013    Procedure: ENDARTERECTOMY FEMORAL WITH PROFUNDAPLOASTY;  Surgeon: Sherren Kerns, MD;  Location[facility] OR;  Service: Vascular;  Laterality: Right;  . Patch angioplasty Right 07/17/2013    Procedure: PATCH ANGIOPLASTY OF COMMON FEMORAL AND PROFUNDA USING SEGMENT OF GREATER SAPHENOUS VEIN ;  Surgeon: Sherren Kerns, MD;  Location: [facility] OR;  Service: Vascular;  Laterality: Right;    Family History  Problem Relation Age of Onset  . Heart attack Father 66  . Hypertension Father   . Hypertension Mother   . Cancer Mother   . Lupus Mother    Social History:  reports that he quit smoking about 4 weeks ago. His smoking use included Cigarettes. He has a 12.5 pack-year smoking  history. He has never used smokeless tobacco. He reports that he uses illicit drugs (Cocaine, "Crack" cocaine, and Marijuana). He reports that he does not drink alcohol.   Allergies  Allergen Reactions  . Vytorin [Ezetimibe-Simvastatin]     Weakness, muscle aches bad.  . Oxycodone-Acetaminophen Nausea And Vomiting    Vomiting      Home medications: Prior  to Admission medications   Medication Sig Start Date End Date Taking? Authorizing Provider  amLODipine (NORVASC) 5 MG tablet Take 5 mg by mouth daily.   Yes Historical Provider, MD  aspirin 81 MG chewable tablet Chew 81 mg by mouth daily.   Yes Historical Provider, MD  calcitRIOL (ROCALTROL) 0.25 MCG capsule Take 1 capsule (0.25 mcg total) by mouth daily. 07/23/13  Yes Lars Mage, PA-C  carvedilol (COREG) 12.5 MG tablet Take 25 mg by mouth 2 (two) times daily with a meal.   Yes Historical Provider, MD  cholecalciferol (VITAMIN D) 400 UNITS TABS tablet Take 400 Units by mouth daily.   Yes Historical Provider, MD  fish oil-omega-3 fatty acids 1000 MG capsule Take 1 g by mouth daily. 10/23/12  Yes Clide Deutscher Serpe, PA-C  furosemide (LASIX) 40 MG tablet Take 40 mg by mouth 2 (two) times daily. 10/14/12  Yes Scott T Weaver, PA-C  glipiZIDE (GLUCOTROL) 5 MG tablet Take 1 tablet (5 mg total) by mouth daily. 10/14/12  Yes Scott Moishe Spice, PA-C  nitroGLYCERIN (NITROSTAT) 0.4 MG SL tablet Place 1 tablet (0.4 mg total) under the tongue every 5 (five) minutes as needed for chest pain. 10/14/12  Yes Scott T Weaver, PA-C  ondansetron (ZOFRAN) 4 MG tablet Take 4 mg by mouth every 8 (eight) hours as needed for nausea or vomiting.   Yes Historical Provider, MD  oxyCODONE-acetaminophen (PERCOCET) 10-325 MG per tablet Take 1 tablet by mouth every 6 (six) hours as needed for pain.   Yes Historical Provider, MD  pravastatin (PRAVACHOL) 40 MG tablet Take 40 mg by mouth at bedtime.  06/15/13  Yes Historical Provider, MD  Gabapentin ?? Mg TID (mother thinks 300 TID -  started about 2 weeks ago)  Inpatient medications: . amLODipine  5 mg Oral Daily  . aspirin  81 mg Oral Daily  . calcitRIOL  0.25 mcg Oral Daily  . carvedilol  25 mg Oral BID WC  . cholecalciferol  400 Units Oral Daily  . enoxaparin (LOVENOX) injection  30 mg Subcutaneous Q24H  . furosemide  40 mg Oral BID  . glipiZIDE  5 mg Oral Q breakfast  . omega-3 acid ethyl esters  1 g Oral Daily  . pantoprazole  40 mg Oral Daily  . piperacillin-tazobactam (ZOSYN)  IV  3.375 g Intravenous Q8H  . pravastatin  40 mg Oral Daily  . [START ON 08/09/2013] vancomycin  750 mg Intravenous Q24H    Review of Systems Gen:  Denies headache, fever, chills, sweats.  No weight loss. HEENT:  No visual change, sore throat, difficulty swallowing. Resp:  No difficulty breathing, DOE.  No cough or hemoptysis. Cardiac:  No chest pain, orthopnea, PND.  Denies edema in left leg but has had RLE swelling and warmth GI:   Denies abdominal pain.   No nausea, vomiting, diarrhea.  No constipation. GU:  Denies difficulty or change in voiding.  No change in urine color.     MS:  Denies joint pain or swelling.   Derm:  Denies skin rash or itching.  No chronic skin conditions.  Neuro:   Denies focal weakness, memory problems, hx stroke or TIA. + twitching and dropping things since starting gabapentin 2 weeks ago   Psych:  Denies symptoms of depression of anxiety.  No hallucination.  Sleeping more.  Physical Exam:  Blood pressure 141/65, pulse 87, temperature 97.6 F (36.4 C), temperature source Oral, resp. rate 18, height 5\' 9"  (1.753 m), weight 99 kg (  218 lb 4.1 oz), SpO2 100.00%.  Gen: very nice AAM NAD VS as noted Skin: no rash, cyanosis Neck: no JVD, no bruits or LAN Chest: clear without crackles or wheezes Heart: regular, no rub or gallop Abdomen: soft, no focal abd tenderness Ext: no LLE edema  RLD with long incision, scaling, groin wound dressed; right great toe dry gangrenous change Neuro: alert, Ox3, no focal  deficit; obvious twitching; dificulty holding a cup without jerking Heme/Lymph: no bruising or LAN   Labs: Basic Metabolic Panel:  Recent Labs Lab 08/08/13 0055 08/08/13 0105 08/08/13 0855  NA 132* 132* 133*  K 6.1* 5.9* 5.9*  CL 96 101 97  CO2 18*  --  20  GLUCOSE 211* 218* 223*  BUN 77* 94* 83*  CREATININE 5.62* 6.40* 5.61*  CALCIUM 8.6  --  9.0   Liver Function Tests:  Recent Labs Lab 08/08/13 0055 08/08/13 0855  AST 11 11  ALT 10 9  ALKPHOS 63 57  BILITOT <0.2* 0.2*  PROT 6.8 6.7  ALBUMIN 2.3* 2.3*   Recent Labs Lab 08/08/13 0055 08/08/13 0105 08/08/13 0855  WBC 15.9*  --  14.3*  NEUTROABS 12.4*  --   --   HGB 8.8* 9.5* 9.0*  HCT 26.8* 28.0* 26.8*  MCV 90.8  --  89.9  PLT 453*  --  385   Recent Labs Lab 08/08/13 0616 08/08/13 1055  GLUCAP 186* 191*   Xrays/Other Studies: Dg Chest Port 1 View  08/08/2013   CLINICAL DATA:  Shortness of breath.  EXAM: PORTABLE CHEST - 1 VIEW  COMPARISON:  07/17/2013  FINDINGS: Normal heart size and unchanged mediastinal contours (when accounting for distortion by right rotation). No edema or consolidation. No effusion or pneumothorax.  IMPRESSION: No active disease.   Electronically Signed   By: Tiburcio Pea M.D.   On: 08/08/2013 01:29    Impression/Plan 1. CKD5  - no access as plan was to wait until healed from fem pop. That course may now be more prolonged with the groin cellulitis. No overt uremic symptoms - I think twitching may be due to gabapentin (says started coincident with that as new med in last 2 weeks - was not reordered on admission so will see if drug related or renal related asterixus).   Hopefully K can be controlled with conservative measures and dietary restriction; readdress with VVS timing of access.  May have to go to "[facility]" .Marland KitchenMarland KitchenWill go ahead with vein mapping (don't see that its ever been done) for completeness in case access this admission 2. Hyperkalemia  - d/t CKD 5; Kayexelate finally given  this AM and 1 amp bicarb; restrict dietary potassium; add oral bicarb tabs 3. PAD - s/p fem pop 3/10 with post op lymphocele and groin cellulitis; gangrenous toe - Vanco and Zosyn; per VVS 4. 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 Continue oral calcitriol; check phos and add binders if need be 5. Hypertension - continue home meds 6. CAD - by cath 2013; med Rx and risk factor reduction 7. Anemia - Start Darbe 100/week for Hb under 10 8. Neuropathy - do not restart gabapentin; will see if twitching and jerking improve in its absence as those symptoms coincided with its initiation  Camille Bal,  MD [facility] [phone] pager 08/08/2013, 2:24 PM

## 2013-08-08 NOTE — ED Notes (Signed)
Pt presents to department for evaluation of abnormal potassium of 6.9, was called by kidney specialist and told to come to ED. History of renal failure, no dialysis at the time. Pt is alert and oriented x4. Denies any symptoms at the time.

## 2013-08-08 NOTE — Progress Notes (Signed)
*  Preliminary Results* Right lower extremity venous duplex completed. Study was technically limited due to right groin wound, right lower extremity dressing, and staples. Visualized veins of the right lower extremity are negative for deep vein thrombosis. There is no evidence of right Baker's cyst.  08/08/2013 9:01 AM  Gertie Fey, RVT, RDCS, RDMS

## 2013-08-08 NOTE — ED Notes (Signed)
Pt sent to ED ref. Abnormal potassium level.  Also pt has redness and drainage to recent surgery site to right leg and right groin

## 2013-08-08 NOTE — ED Provider Notes (Signed)
CSN: [id number]     Arrival date & time 08/08/13  0021 History   First MD Initiated Contact with Patient 08/08/13 [phone]     Chief Complaint  Patient presents with  . Abnormal Lab     (Consider location/radiation/quality/duration/timing/severity/associated sxs/prior Treatment) HPI Comments: 43 year old male, history of end-stage renal disease who is not yet on dialysis who also underwent multiple vascular procedures 3 weeks ago see below  BYPASS GRAFT FEMORAL-POPLITEAL ARTERY-RIGHT ENDARTERECTOMY FEMORAL WITH PROFUNDAPLOASTY PATCH ANGIOPLASTY OF COMMON FEMORAL AND PROFUNDA USING SEGMENT OF GREATER SAPHENOUS VEIN   He presents with the complaint of hyperkalemia. He was at the office today at the renal physician where he had routine blood work done and received a call tonight stating that he had a potassium of 6.9. The patient denies any shortness of breath or dyspnea, he does have ongoing and persistent leg swelling and leg pain since his discharge from the hospital of the right lower extremity where the multiple vascular procedures were performed. They deny fever but the swelling has been significant, there is ongoing discharge and drainage from the wound which was closed with staples.  The history is provided by the patient.    Past Medical History  Diagnosis Date  . Essential hypertension, benign   . Mixed hyperlipidemia   . Coronary atherosclerosis of native coronary artery     100% apical LAD and distal OM1, Rx medically - 11/13  . Ischemic cardiomyopathy     LVEF 40-45%  . RBBB   . PAD (peripheral artery disease)     Normal ABIs, 2011; focal left EIA stenosis; mild bilateral distal SFA disease, 2008  . CHF (congestive heart failure)   . Anginal pain   . Type 2 diabetes mellitus   . History of blood transfusion 1988    "arm went thru glass window" (10/12/2012)  . CKD (chronic kidney disease) stage 3, GFR 30-59 ml/min   . Gouty arthritis   . Depression   . NSTEMI (non-ST  elevated myocardial infarction) 02/2012 and 10/2012  . Shortness of breath     when he has fluid he gets sob   Past Surgical History  Procedure Laterality Date  . Arm surgery Right [phone]'s    "@ least 3 ORs; took nerves out of my left leg and put them in my arm" (10/12/2012)  . Cardiac catheterization  03/28/12    20% prox and mid LAD, 100% distal LAD, 30% prox OM, 100% distal OM, 40% prox RCA, 40% mid RCA, 25% distal RCA, small PDA w/ 80% diffuse dz, PLB small w/ 50% diffuse stenoses. No focal lesions for PCI. Recommendations for continued medical management and aggressive RF reduction  . Refractive surgery Bilateral ~ 2005  . Eye surgery Left 1990's    "blood vessel ruptured" (10/12/2012)  . Colonoscopy    . Femoral-popliteal bypass graft Right 07/17/2013    Procedure: BYPASS GRAFT FEMORAL-POPLITEAL ARTERY-RIGHT;  Surgeon: Sherren Kernsharles E Fields, MD;  [facility];  Service: Vascular;  Laterality: Right;  . Endarterectomy femoral Right 07/17/2013    Procedure: ENDARTERECTOMY FEMORAL WITH PROFUNDAPLOASTY;  Surgeon: Sherren Kernsharles E Fields, MD;  [facility];  Service: Vascular;  Laterality: Right;  . Patch angioplasty Right 07/17/2013    Procedure: PATCH ANGIOPLASTY OF COMMON FEMORAL AND PROFUNDA USING SEGMENT OF GREATER SAPHENOUS VEIN ;  Surgeon: Sherren Kernsharles E Fields, MD;  [facility];  Service: Vascular;  Laterality: Right;   Family History  Problem Relation Age of Onset  . Heart attack Father 250  .  Hypertension Father   . Hypertension Mother   . Cancer Mother   . Lupus Mother    History  Substance Use Topics  . Smoking status: Former Smoker -- 0.50 packs/day for 25 years    Types: Cigarettes    Quit date: 07/11/2013  . Smokeless tobacco: Never Used  . Alcohol Use: No     Comment: 10/12/2012 "quit drinking > 10 yr ago; was an alcoholic"    Review of Systems  All other systems reviewed and are negative.      Allergies  Vytorin and Oxycodone-acetaminophen  Home Medications    Current Outpatient Rx  Name  Route  Sig  Dispense  Refill  . amLODipine (NORVASC) 5 MG tablet   Oral   Take 5 mg by mouth daily.         Marland Kitchen aspirin 81 MG chewable tablet   Oral   Chew 81 mg by mouth daily.         . calcitRIOL (ROCALTROL) 0.25 MCG capsule   Oral   Take 1 capsule (0.25 mcg total) by mouth daily.   30 capsule   0   . carvedilol (COREG) 12.5 MG tablet   Oral   Take 25 mg by mouth 2 (two) times daily with a meal.         . cholecalciferol (VITAMIN D) 400 UNITS TABS tablet   Oral   Take 400 Units by mouth daily.         . fish oil-omega-3 fatty acids 1000 MG capsule   Oral   Take 1 g by mouth daily.         . furosemide (LASIX) 40 MG tablet   Oral   Take 40 mg by mouth 2 (two) times daily.         Marland Kitchen glipiZIDE (GLUCOTROL) 5 MG tablet   Oral   Take 1 tablet (5 mg total) by mouth daily.   30 tablet   1   . nitroGLYCERIN (NITROSTAT) 0.4 MG SL tablet   Sublingual   Place 1 tablet (0.4 mg total) under the tongue every 5 (five) minutes as needed for chest pain.   25 tablet   11   . ondansetron (ZOFRAN) 4 MG tablet   Oral   Take 4 mg by mouth every 8 (eight) hours as needed for nausea or vomiting.         Marland Kitchen oxyCODONE-acetaminophen (PERCOCET) 10-325 MG per tablet   Oral   Take 1 tablet by mouth every 6 (six) hours as needed for pain.         . pravastatin (PRAVACHOL) 40 MG tablet   Oral   Take 40 mg by mouth at bedtime.           BP 137/74  Pulse 86  Temp(Src) 98.5 F (36.9 C) (Oral)  Resp 18  SpO2 98% Physical Exam  Nursing note and vitals reviewed. Constitutional: He appears well-developed and well-nourished. No distress.  HENT:  Head: Normocephalic and atraumatic.  Mouth/Throat: Oropharynx is clear and moist. No oropharyngeal exudate.  Eyes: Conjunctivae and EOM are normal. Pupils are equal, round, and reactive to light. Right eye exhibits no discharge. Left eye exhibits no discharge. No scleral icterus.  Neck: Normal  range of motion. Neck supple. No JVD present. No thyromegaly present.  Cardiovascular: Normal rate, regular rhythm, normal heart sounds and intact distal pulses.  Exam reveals no gallop and no friction rub.   No murmur heard. Pulmonary/Chest: Effort normal and breath sounds  normal. No respiratory distress. He has no wheezes. He has no rales.  Abdominal: Soft. Bowel sounds are normal. He exhibits no distension and no mass. There is no tenderness.  Musculoskeletal: He exhibits edema and tenderness.  Right lower extremity was significant swelling compared to the left lower extremity, medial wound repaired with staples with approximately 1.5 cm of erythema surrounding the wound on each side extending from just below the knee to the inguinal area. There is some drainage from the proximal wound, it appears serous  Lymphadenopathy:    He has no cervical adenopathy.  Neurological: He is alert. Coordination normal.  Skin: Skin is warm and dry. No rash noted. No erythema.  Psychiatric: He has a normal mood and affect. His behavior is normal.    ED Course  Procedures (including critical care time) Labs Review Labs Reviewed  CBC WITH DIFFERENTIAL - Abnormal; Notable for the following:    WBC 15.9 (*)    RBC 2.95 (*)    Hemoglobin 8.8 (*)    HCT 26.8 (*)    Platelets 453 (*)    Neutro Abs 12.4 (*)    Monocytes Absolute 1.1 (*)    All other components within normal limits  COMPREHENSIVE METABOLIC PANEL - Abnormal; Notable for the following:    Sodium 132 (*)    Potassium 6.1 (*)    CO2 18 (*)    Glucose, Bld 211 (*)    BUN 77 (*)    Creatinine, Ser 5.62 (*)    Albumin 2.3 (*)    Total Bilirubin <0.2 (*)    GFR calc non Af Amer 11 (*)    GFR calc Af Amer 13 (*)    All other components within normal limits  I-STAT CHEM 8, ED - Abnormal; Notable for the following:    Sodium 132 (*)    Potassium 5.9 (*)    BUN 94 (*)    Creatinine, Ser 6.40 (*)    Glucose, Bld 218 (*)    Hemoglobin 9.5 (*)     HCT 28.0 (*)    All other components within normal limits  I-STAT CG4 LACTIC ACID, ED   Imaging Review Dg Chest Port 1 View  08/08/2013   CLINICAL DATA:  Shortness of breath.  EXAM: PORTABLE CHEST - 1 VIEW  COMPARISON:  07/17/2013  FINDINGS: Normal heart size and unchanged mediastinal contours (when accounting for distortion by right rotation). No edema or consolidation. No effusion or pneumothorax.  IMPRESSION: No active disease.   Electronically Signed   By: Tiburcio Pea M.D.   On: 08/08/2013 01:29     EKG Interpretation   Date/Time:  Wednesday August 08 2013 00:30:15 EDT Ventricular Rate:  89 PR Interval:  122 QRS Duration: 134 QT Interval:  380 QTC Calculation: 462 R Axis:   74 Text Interpretation:  Normal sinus rhythm Right bundle branch block Cannot  rule out Inferior infarct , age undetermined Abnormal ECG since last  tracing no significant change Confirmed by Alexande Sheerin  MD, Valinda Fedie ([id number]) on  08/08/2013 1:05:51 AM      MDM   Final diagnoses:  Hyperkalemia  Renal failure  Post op infection    The patient has no rales, no shortness of breath and no hypoxia, he is not febrile but does have significant swelling of the right lower extremity consistent with his postsurgical state. Would also be concerned for DVT given the amount of swelling, concern for postoperative abscess as well given several areas of focal swelling and induration.  The patient will need multiple testing including confirming hyperkalemia, EKG shows no changes, he has a right bundle branch block with T waves present. This is unchanged from prior. Possibly antibiotics, admission to the hospital anticipated. Cardiac monitoring at this time  Axillae given for hyperkalemia, EKG essentially unchanged. No fever or tachycardia and no hypotension. Because of the significant swelling of the leg I discussed the patient's care with vascular surgery, Dr. Durwin Nora who will come to see the patient. Antibiotics were  ordered.  Meds given in ED:  Medications  piperacillin-tazobactam (ZOSYN) IVPB 3.375 g (3.375 g Intravenous New Bag/Given 08/08/13 0252)  vancomycin (VANCOCIN) IVPB 1000 mg/200 mL premix (not administered)  sodium polystyrene (KAYEXALATE) 15 GM/60ML suspension 15 g (not administered)         Vida Roller, MD 08/08/13 [phone]

## 2013-08-08 NOTE — Progress Notes (Signed)
Consulted Dr. Eliott Nine for renal Issues.  I ordered Kay ex 30 g, amp of bicarb and bmet for 12 noon.  Lesly Pontarelli MAUREEN PA-C

## 2013-08-08 NOTE — Progress Notes (Signed)
Utilization Review Completed.[name] T4/05/2013  

## 2013-08-08 NOTE — H&P (Signed)
Vascular and Vein Specialist of [facility]  Patient name: [name] MRN: [id number] DOB: 1970-12-07 Sex: male  REASON FOR ADMISSION: swelling in the right lower extremity with some drainage from the right groin.  HPI: [name] is a 43 y.o. male who is [name] right femoral to below knee popliteal bypass with Propaten, common femoral and external iliac endarterectomy, extended profundaplasty on 07/17/13. He was d/c'd on 07/23/13. He was doing well except for some continued pain in the right great toe. 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 He states that he has had significant right lower extremity swelling which is gradually gotten worse. He denies fever or chills. He notices some clear drainage from the right proximal thigh.  He apparently had some labs drawn outside of the hospital and was noted to have hyperkalemia. He was told to come to the emergency department. He was evaluated by the emergency department physician and vascular surgery was consulted because of the swelling in the right leg and drainage from the right groin.  He has a history of chronic kidney disease and is followed by Dr. Lowell Guitar. He was being considered for access for dialysis but during his hospitalization it was decided to wait until after he recovered from his surgery as his kidney function was stable.  Past Medical History  Diagnosis Date  . Essential hypertension, benign   . Mixed hyperlipidemia   . Coronary atherosclerosis of native coronary artery     100% apical LAD and distal OM1, Rx medically - 11/13  . Ischemic cardiomyopathy     LVEF 40-45%  . RBBB   . PAD (peripheral artery disease)     Normal ABIs, 2011; focal left EIA stenosis; mild bilateral distal SFA disease, 2008  . CHF (congestive heart failure)   . Anginal pain   . Type 2 diabetes mellitus   . History of blood transfusion 1988    "arm went thru glass window" (10/12/2012)  . CKD (chronic kidney disease)  stage 3, GFR 30-59 ml/min   . Gouty arthritis   . Depression   . NSTEMI (non-ST elevated myocardial infarction) 02/2012 and 10/2012  . Shortness of breath     when he has fluid he gets sob   Family History  Problem Relation Age of Onset  . Heart attack Father 55  . Hypertension Father   . Hypertension Mother   . Cancer Mother   . Lupus Mother    SOCIAL HISTORY: History  Substance Use Topics  . Smoking status: Former Smoker -- 0.50 packs/day for 25 years    Types: Cigarettes    Quit date: 07/11/2013  . Smokeless tobacco: Never Used  . Alcohol Use: No     Comment: 10/12/2012 "quit drinking > 10 yr ago; was an alcoholic"   Allergies  Allergen Reactions  . Vytorin [Ezetimibe-Simvastatin]     Weakness, muscle aches bad.  . Oxycodone-Acetaminophen Nausea And Vomiting    Vomiting     Current Facility-Administered Medications  Medication Dose Route Frequency Provider Last Rate Last Dose  . sodium polystyrene (KAYEXALATE) 15 GM/60ML suspension 15 g  15 g Oral Once Vida Roller, MD      . vancomycin (VANCOCIN) IVPB 1000 mg/200 mL premix  1,000 mg Intravenous Once Vida Roller, MD 200 mL/hr at 08/08/13 0326 1,000 mg at 08/08/13 3664   Current Outpatient Prescriptions  Medication Sig Dispense Refill  . amLODipine (NORVASC) 5 MG tablet Take  5 mg by mouth daily.      Marland Kitchen aspirin 81 MG chewable tablet Chew 81 mg by mouth daily.      . calcitRIOL (ROCALTROL) 0.25 MCG capsule Take 1 capsule (0.25 mcg total) by mouth daily.  30 capsule  0  . carvedilol (COREG) 12.5 MG tablet Take 25 mg by mouth 2 (two) times daily with a meal.      . cholecalciferol (VITAMIN D) 400 UNITS TABS tablet Take 400 Units by mouth daily.      . fish oil-omega-3 fatty acids 1000 MG capsule Take 1 g by mouth daily.      . furosemide (LASIX) 40 MG tablet Take 40 mg by mouth 2 (two) times daily.      Marland Kitchen glipiZIDE (GLUCOTROL) 5 MG tablet Take 1 tablet (5 mg total) by mouth daily.  30 tablet  1  . nitroGLYCERIN  (NITROSTAT) 0.4 MG SL tablet Place 1 tablet (0.4 mg total) under the tongue every 5 (five) minutes as needed for chest pain.  25 tablet  11  . ondansetron (ZOFRAN) 4 MG tablet Take 4 mg by mouth every 8 (eight) hours as needed for nausea or vomiting.      Marland Kitchen oxyCODONE-acetaminophen (PERCOCET) 10-325 MG per tablet Take 1 tablet by mouth every 6 (six) hours as needed for pain.      . pravastatin (PRAVACHOL) 40 MG tablet Take 40 mg by mouth at bedtime.        REVIEW OF SYSTEMS: Arly.Keller ] denotes positive finding; [  ] denotes negative finding CARDIOVASCULAR:  [ ]  chest pain   [ ]  chest pressure   [ ]  palpitations   [ ]  orthopnea   Arly.Keller ] dyspnea on exertion   [ ]  claudication   [ ]  rest pain   [ ]  DVT   [ ]  phlebitis PULMONARY:   [ ]  productive cough   [ ]  asthma   [ ]  wheezing NEUROLOGIC:   [ ]  weakness  [ ]  paresthesias  [ ]  aphasia  [ ]  amaurosis  [ ]  dizziness HEMATOLOGIC:   [ ]  bleeding problems   [ ]  clotting disorders MUSCULOSKELETAL:  [ ]  joint pain   [ ]  joint swelling [ ]  leg swelling GASTROINTESTINAL: [ ]   blood in stool  [ ]   hematemesis GENITOURINARY:  [ ]   dysuria  [ ]   hematuria PSYCHIATRIC:  [ ]  history of major depression INTEGUMENTARY:  [ ]  rashes  Arly.Keller ] ulcers right great toe with dry gangrene CONSTITUTIONAL:  [ ]  fever   [ ]  chills  PHYSICAL EXAM: Filed Vitals:   08/08/13 0245 08/08/13 0300 08/08/13 0315 08/08/13 0330  BP: 124/64 131/80 126/75 134/69  Pulse: 86 86 85 84  Temp:      TempSrc:      Resp: 13 12 12 17   SpO2: 97% 95% 97% 97%   There is no weight on file to calculate BMI. GENERAL: The patient is a well-nourished male, in no acute distress. The vital signs are documented above. CARDIOVASCULAR: There is a regular rate and rhythm. He has brisk Doppler signal in the right posterior tibial artery. He has significant right lower extremity swelling up to the groin. PULMONARY: There is good air exchange bilaterally without wheezing or rales. ABDOMEN: Soft and non-tender with  normal pitched bowel sounds.  MUSCULOSKELETAL: There are no major deformities or cyanosis. NEUROLOGIC: No focal weakness or paresthesias are detected. SKIN: He has dry gangrene of the tip of the right great  toe. He had some swelling in the proximal thigh I was able to get into this area with a Q-tip and draining some clear lymphatic fluid. The groin incision was somewhat mesa rated with mild cellulitis but currently I do not see any significant drainage. PSYCHIATRIC: The patient has a normal affect.  DATA:  Lab Results  Component Value Date   WBC 15.9* 08/08/2013   HGB 9.5* 08/08/2013   HCT 28.0* 08/08/2013   MCV 90.8 08/08/2013   PLT 453* 08/08/2013   Lab Results  Component Value Date   NA 132* 08/08/2013   K 5.9* 08/08/2013   CL 101 08/08/2013   CO2 18* 08/08/2013   Lab Results  Component Value Date   CREATININE 6.40* 08/08/2013   Lab Results  Component Value Date   INR 1.07 07/17/2013   INR 0.9 01/02/2007   Lab Results  Component Value Date   HGBA1C 8.3* 07/18/2013   MEDICAL ISSUES:  * LYMPHOCELE: I was able to drain the lymphocele in the proximal right thigh through the proximal right thigh incision. He will need dressing changes every shift and as needed for lymphatic drainage. The drainage was clear and did not appear infected.  * CELLULITIS RIGHT GROIN: Given that he has a prosthetic graft, we'll start him on vancomycin and Zosyn. He will need meticulous wound care with dry gauze in the right groin for now. If he develops any significant drainage this will need to be cultured.  * STATUS POST RIGHT FEMOROPOPLITEAL BYPASS GRAFT: The graft appears to be patent with a brisk posterior tibial signal with the Doppler on the right.  * RIGHT LOWER EXTREMITY SWELLING: I have ordered a right lower extremity venous duplex to rule out DVT. For now he is on subcutaneous Lovenox for DVT prophylaxis.  * HYPERKALEMIA: This is being treated by the emergency department and he has been ordered Kayexalate.  We will need to follow his potassium closely and also he will need to have a renal consult. Dr. Lowell GuitarPowell is his nephrologist.  * POORLY CONTROLLED DIABETES: His hemoglobin A1c is 8.3. His blood sugars were difficult to control, he will need a consult to the Hospitalist.   [name] [facility] [place] [facility]: [phone]

## 2013-08-08 NOTE — Progress Notes (Signed)
Inpatient Diabetes Program Recommendations  AACE/ADA: New Consensus Statement on Inpatient Glycemic Control (2013)  Target Ranges:  Prepandial:   less than 140 mg/dL      Peak postprandial:   less than 180 mg/dL (1-2 hours)      Critically ill patients:  140 - 180 mg/dL   Pt here with cellulitis. FPOD early march, but now has gangrenous toe. CKD, stage IV. Pt on Glipizide only on home dose of Glipizide 5 mg. Inpatient Diabetes Program Recommendations Correction (SSI): Please check cbg's tidwc and use the sensitive correction scale tidws. Check the HS cbg as well. Oral Agents: Please do not use Gliipizide while in the hospital. PO intake may be variable and potentiate risk for hypoglycemia. HgbA1C is 8.3%, however pt has low Hgb/Hct which most probably makes the A1C inaccurate.  Thank you, Lenor Coffin, RN, CNS, Diabetes Coordinator [phone])

## 2013-08-08 NOTE — Progress Notes (Signed)
Patient admitted from ED with Potassium of 5.9 and right leg cellulitis. Alert and pleasant.

## 2013-08-08 DEATH — deceased

## 2013-08-09 DIAGNOSIS — Z01810 Encounter for preprocedural cardiovascular examination: Secondary | ICD-10-CM

## 2013-08-09 LAB — RENAL FUNCTION PANEL
Albumin: 2 g/dL — ABNORMAL LOW (ref 3.5–5.2)
BUN: 74 mg/dL — ABNORMAL HIGH (ref 6–23)
CO2: 22 meq/L (ref 19–32)
Calcium: 9.2 mg/dL (ref 8.4–10.5)
Chloride: 100 mEq/L (ref 96–112)
Creatinine, Ser: 5.31 mg/dL — ABNORMAL HIGH (ref 0.50–1.35)
GFR calc non Af Amer: 12 mL/min — ABNORMAL LOW (ref >90)
GFR, EST AFRICAN AMERICAN: 14 mL/min — AB (ref >90)
GLUCOSE: 74 mg/dL (ref 70–99)
POTASSIUM: 5.5 meq/L — AB (ref 3.7–5.3)
Phosphorus: 6.7 mg/dL — ABNORMAL HIGH (ref 2.3–4.6)
Sodium: 138 mEq/L (ref 137–147)

## 2013-08-09 LAB — GLUCOSE, CAPILLARY
GLUCOSE-CAPILLARY: 67 mg/dL — AB (ref 70–99)
GLUCOSE-CAPILLARY: 77 mg/dL (ref 70–99)
Glucose-Capillary: 81 mg/dL (ref 70–99)
Glucose-Capillary: 70 mg/dL (ref 70–99)
Glucose-Capillary: 61 mg/dL — ABNORMAL LOW (ref 70–99)
Glucose-Capillary: 79 mg/dL (ref 70–99)

## 2013-08-09 LAB — CBC
HEMATOCRIT: 26.5 % — AB (ref 39.0–52.0)
HEMOGLOBIN: 8.8 g/dL — AB (ref 13.0–17.0)
MCH: 29.6 pg (ref 26.0–34.0)
MCHC: 33.2 g/dL (ref 30.0–36.0)
MCV: 89.2 fL (ref 78.0–100.0)
Platelets: 391 10*3/uL (ref 150–400)
RBC: 2.97 MIL/uL — ABNORMAL LOW (ref 4.22–5.81)
RDW: 13.1 % (ref 11.5–15.5)
WBC: 11.7 10*3/uL — AB (ref 4.0–10.5)

## 2013-08-09 MED ORDER — SEVELAMER CARBONATE 800 MG PO TABS
800.0000 mg | ORAL_TABLET | Freq: Three times a day (TID) | ORAL | Status: DC
  Administered 2013-08-09 – 2013-08-14 (×15): 800 mg via ORAL
  Filled 2013-08-09 (×18): qty 1

## 2013-08-09 MED ORDER — SODIUM POLYSTYRENE SULFONATE 15 GM/60ML PO SUSP
15.0000 g | Freq: Once | ORAL | Status: AC
  Administered 2013-08-09: 15 g via ORAL
  Filled 2013-08-09: qty 60

## 2013-08-09 MED ORDER — INSULIN ASPART 100 UNIT/ML ~~LOC~~ SOLN
0.0000 [IU] | Freq: Three times a day (TID) | SUBCUTANEOUS | Status: DC
  Administered 2013-08-10: 1 [IU] via SUBCUTANEOUS
  Administered 2013-08-10 – 2013-08-11 (×2): 2 [IU] via SUBCUTANEOUS
  Administered 2013-08-11: 1 [IU] via SUBCUTANEOUS
  Administered 2013-08-12: 2 [IU] via SUBCUTANEOUS
  Administered 2013-08-12: 3 [IU] via SUBCUTANEOUS
  Administered 2013-08-13 (×2): 2 [IU] via SUBCUTANEOUS

## 2013-08-09 MED ORDER — PIPERACILLIN-TAZOBACTAM IN DEX 2-0.25 GM/50ML IV SOLN
2.2500 g | Freq: Three times a day (TID) | INTRAVENOUS | Status: DC
  Administered 2013-08-09 – 2013-08-14 (×15): 2.25 g via INTRAVENOUS
  Filled 2013-08-09 (×18): qty 50

## 2013-08-09 NOTE — Progress Notes (Signed)
Vascular and Vein Specialists Progress Note  08/09/2013 1:23 PM Hospital Day 2  Subjective:  Sleeping, but wakes easily  Afebrile VSS 100% RA  Filed Vitals:   08/09/13 0750  BP: 150/74  Pulse: 83  Temp:   Resp:     Physical Exam:  Lungs:  Non labored Abdomen:  Soft NT/ND Extremities:  RLE with + edema; + audible doppler signal right PT>peroneal Incisions:  Right groin is slightly separated proximally with fibrinous tissue; two saphenous vein harvest sites proximally are intact, but there is hardening under the incision.  No drainage is present.  Right great toe wound improved.  CBC    Component Value Date/Time   WBC 11.7* 08/09/2013 0609   RBC 2.97* 08/09/2013 0609   HGB 8.8* 08/09/2013 0609   HCT 26.5* 08/09/2013 0609   PLT 391 08/09/2013 0609   MCV 89.2 08/09/2013 0609   MCH 29.6 08/09/2013 0609   MCHC 33.2 08/09/2013 0609   RDW 13.1 08/09/2013 0609   LYMPHSABS 2.1 08/08/2013 0055   MONOABS 1.1* 08/08/2013 0055   EOSABS 0.3 08/08/2013 0055   BASOSABS 0.0 08/08/2013 0055    BMET    Component Value Date/Time   NA 138 08/09/2013 0609   K 5.5* 08/09/2013 0609   CL 100 08/09/2013 0609   CO2 22 08/09/2013 0609   GLUCOSE 74 08/09/2013 0609   BUN 74* 08/09/2013 0609   CREATININE 5.31* 08/09/2013 0609   CALCIUM 9.2 08/09/2013 0609   GFRNONAA 12* 08/09/2013 0609   GFRAA 14* 08/09/2013 0609    INR    Component Value Date/Time   INR 1.19 08/08/2013 0855     Intake/Output Summary (Last 24 hours) at 08/09/13 1323 Last data filed at 08/09/13 1200  Gross per 24 hour  Intake    480 ml  Output   1600 ml  Net  -1120 ml     Assessment/Plan:  43 y.o. male who is s/p right femoral to popliteal bypass with propaten, CFA and EIA endarterectomy and extended profundoplasty 07/17/13  Hospital Day 2  -right groin wound is slightly separated with fibrinous material present and moist.  I did culture this.  I explained to the pt how important it is to keep this clean and dry as he does have synthetic material for his  bypass graft.  Explained to him also, that if this gets infected, it could mean losing his leg and that meticulous wound care is critical. -He is afebrile and his WBC is improved today.  Continue ABx -DM coordinator's recommendations noted and I have discontinued his Glipizide and ordered a sensitive SSI -pt had vein mapping today and the right cephalic vein is poorly visualized.  He will be a candidate for a left radiocephalic vs brachiocephalic fistula. -continues to be hyperkalemic today -Right leg swelling-RLE venous duplex results are negative for DVT.  Pt is on Lovenox 30 mg for DVT prophylaxis.   Doreatha Massed, PA-C [facility] [phone] 08/09/2013 1:23 PM

## 2013-08-09 NOTE — Progress Notes (Signed)
ANTIBIOTIC CONSULT NOTE -follow up Pharmacy Consult for Vancocin and Zosyn Indication: cellulitis  Allergies  Allergen Reactions  . Vytorin [Ezetimibe-Simvastatin]     Weakness, muscle aches bad.  . Oxycodone-Acetaminophen Nausea And Vomiting    Vomiting      Patient Measurements: Weight: 99kg  Vital Signs: Temp: 98.5 F (36.9 C) (04/02 0447) Temp src: Oral (04/02 0447) BP: 150/74 mmHg (04/02 0750) Pulse Rate: 83 (04/02 0750) Intake/Output from previous day: 04/01 0701 - 04/02 0700 In: 240 [P.O.:240] Out: 1550 [Urine:1550] Intake/Output from this shift: Total I/O In: 240 [P.O.:240] Out: 200 [Urine:200]  Labs:  Recent Labs  08/08/13 0055 08/08/13 0105 08/08/13 0855 08/08/13 1525 08/09/13 0609  WBC 15.9*  --  14.3*  --  11.7*  HGB 8.8* 9.5* 9.0*  --  8.8*  PLT 453*  --  385  --  391  CREATININE 5.62* 6.40* 5.61* 5.58* 5.31*    Microbiology: No results found for this or any previous visit (from the past 720 hour(s)).  Medical History: Past Medical History  Diagnosis Date  . Essential hypertension, benign   . Mixed hyperlipidemia   . Coronary atherosclerosis of native coronary artery     100% apical LAD and distal OM1, Rx medically - 11/13  . Ischemic cardiomyopathy     LVEF 40-45%  . RBBB   . PAD (peripheral artery disease)     Normal ABIs, 2011; focal left EIA stenosis; mild bilateral distal SFA disease, 2008  . CHF (congestive heart failure)   . Anginal pain   . Type 2 diabetes mellitus   . History of blood transfusion 1988    "arm went thru glass window" (10/12/2012)  . CKD (chronic kidney disease) stage 3, GFR 30-59 ml/min   . Gouty arthritis   . Depression   . NSTEMI (non-ST elevated myocardial infarction) 02/2012 and 10/2012  . Shortness of breath     when he has fluid he gets sob    Assessment: 43yo male was discharged ~2wk ago s/p right femoral to below knee popliteal bypass, now w/ lower extremity swelling and drainage from groin, vasc  surgeon drained lymphocele. Started on IV ABX for cellulitis of groin 4/1. 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  WBC 15.9>14.3>11.7; af, CREAT 5.31.    4/1 vanc 1 gm at 0326 4/2 vanc 750mg  at 0331  Zosyn 4/1>> vanc 4/1>>  4/2 wound cx >>  Goal of Therapy:  Vancomycin trough level 10-15 mcg/ml  Plan:  1. Decrease zosyn to 2.25 gm q8h 2. Dc vanc 750 q24, order random VR for Sat am, will redose when ~ < 15 mcg/ml Herby Abraham, Pharm.D. [phone] 08/09/2013 2:02 PM

## 2013-08-09 NOTE — Care Management Note (Signed)
    Page 1 of 2   08/14/2013     11:33:03 AM   CARE MANAGEMENT NOTE 08/14/2013  Patient:  [name], [name]   Account Number:  [id number]  Date Initiated:  08/09/2013  Documentation initiated by:  Estel Tonelli  Subjective/Objective Assessment:   PT ADM ON 08/08/13 WITH HYPERKALEMIA, CELLULITIS OF RT GROIN. PTA, PT INDEPENDENT, LIVES WITH SPOUSE.     Action/Plan:   WILL FOLLOW FOR DC NEEDS AS PT PROGRESSES.   Anticipated DC Date:  08/12/2013   Anticipated DC Plan:  HOME W HOME HEALTH SERVICES      DC Planning Services  CM consult      [facility] Choice  HOME HEALTH   Choice offered to / List presented to:  C-1 Patient        HH arranged  HH-1 RN      [facility] agency  [facility]   Status of service:  Completed, signed off Medicare Important Message given?   (If response is "NO", the following Medicare IM given date fields will be blank) Date Medicare IM given:   Date Additional Medicare IM given:    Discharge Disposition:  HOME W HOME HEALTH SERVICES  Per UR Regulation:  Reviewed for med. necessity/level of care/duration of stay  If discussed at Long Length of Stay Meetings, dates discussed:   08/14/2013    Comments:  08/14/13 Jocelyn Nold,RN,BSN [phone] PT FOR DC HOME TODAY; REQUESTS HHRN FOR WOUND CARE FOLLOW UP.  PT STATES HE WILL DC TO HIS GRANDMOTHER'S HOME WHILE HIS WIFE IS WORKING.  HH ORDERS OBTAINED FROM PA; REFERRAL TO [facility], PER PT CHOICE.  START OF CARE 24-48H POST DC DATE.  GRANDMOTHER'S ADDRESS: [address] [place], [place] [address]  PT CELL:  [phone]

## 2013-08-09 NOTE — Progress Notes (Signed)
4/1 Inpatient Diabetes Program Recommendations Correction (SSI): Please check cbg's tidwc and use the sensitive correction scale tidws. Check the HS cbg as well. Oral Agents: Please do not use Gliipizide while in the hospital. PO intake may be variable and potentiate risk for hypoglycemia.  4/2AD:  pt had lab glucose of 74 this am and cbg of 70 mg/dL before lunch. Please do not use Glipizide while in the hospital Use sensitive correction scale tidwc only at this time. AC  Thank you, Lenor Coffin, RN, CNS, Diabetes Coordinator [phone])

## 2013-08-09 NOTE — Progress Notes (Addendum)
Right  Upper Extremity Vein Map    Cephalic-poorly visualized    Left Upper Extremity Vein Map    Cephalic  Segment Diameter Depth Comment  1. Axilla mm mm   2. Mid upper arm 3.40mm mm   3. Above AC 2.54mm mm   4. In AC 30mm mm   5. Below AC 2.34mm mm   6. Mid forearm 1.71mm mm   7. Wrist 3.62mm mm    3.37mm mm    mm mm    mm mm

## 2013-08-09 NOTE — Progress Notes (Signed)
Subjective:  Received kayexelate and bicarb yesterday for hyperkalemia K down to 5.2 after, 5.5 today Renal function stable (creatinine 5.3) Twitching seems better (off gabapentin for over 24 hours)  Objective Vital signs in last 24 hours: Filed Vitals:   08/08/13 1614 08/08/13 2110 08/09/13 0447 08/09/13 0750  BP: 104/87 126/66 135/61 150/74  Pulse: 93 88 80 83  Temp:  98.4 F (36.9 C) 98.5 F (36.9 C)   TempSrc:  Oral Oral   Resp:  18 18   Height:      Weight:   98.113 kg (216 lb 4.8 oz)   SpO2:  100% 100%    Weight change: -0.887 kg (-1 lb 15.3 oz)  Intake/Output Summary (Last 24 hours) at 08/09/13 0954 Last data filed at 08/09/13 0730  Gross per 24 hour  Intake    480 ml  Output   1400 ml  Net   -920 ml   Physical Exam:  Blood pressure 150/74, pulse 83, temperature 98.5 F (36.9 C), temperature source Oral, resp. rate 18, height 5\' 9"  (1.753 m), weight 98.113 kg (216 lb 4.8 oz), SpO2 100.00%. Gen: very nice AAM NAD VS as noted  Skin: no rash, cyanosis  Neck: no JVD, no bruits or LAN  Chest: clear without crackles or wheezes  Heart: regular, no rub or gallop  Abdomen: soft, no focal abd tenderness  Ext: no LLE edema RLD with long incision, scaling, groin wound dressed; right great toe dry gangrenous change  Neuro: alert, Ox3, no focal deficit; obvious twitching; dificulty holding a cup without jerking  Heme/Lymph: no bruising or LAN   Recent Labs Lab 08/08/13 0055 08/08/13 0105 08/08/13 0855 08/08/13 1525 08/09/13 0609  NA 132* 132* 133* 136* 138  K 6.1* 5.9* 5.9* 5.2 5.5*  CL 96 101 97 100 100  CO2 18*  --  20 20 22   GLUCOSE 211* 218* 223* 182* 74  BUN 77* 94* 83* 76* 74*  CREATININE 5.62* 6.40* 5.61* 5.58* 5.31*  CALCIUM 8.6  --  9.0 8.4 9.2  PHOS  --   --   --   --  6.7*    Recent Labs Lab 08/08/13 0055 08/08/13 0855 08/09/13 0609  AST 11 11  --   ALT 10 9  --   ALKPHOS 63 57  --   BILITOT <0.2* 0.2*  --   PROT 6.8 6.7  --   ALBUMIN 2.3*  2.3* 2.0*   No results found for this basename: LIPASE, AMYLASE,  in the last 168 hours No results found for this basename: AMMONIA,  in the last 168 hours CBC:  Recent Labs Lab 08/08/13 0055 08/08/13 0105 08/08/13 0855 08/09/13 0609  WBC 15.9*  --  14.3* 11.7*  NEUTROABS 12.4*  --   --   --   HGB 8.8* 9.5* 9.0* 8.8*  HCT 26.8* 28.0* 26.8* 26.5*  MCV 90.8  --  89.9 89.2  PLT 453*  --  385 391   Recent Labs Lab 08/08/13 0616 08/08/13 1055 08/08/13 1649 08/08/13 2108 08/09/13 0634  GLUCAP 186* 191* 161* 192* 81   Studies/Results: Dg Chest Port 1 View  08/08/2013   CLINICAL DATA:  Shortness of breath.  EXAM: PORTABLE CHEST - 1 VIEW  COMPARISON:  07/17/2013  FINDINGS: Normal heart size and unchanged mediastinal contours (when accounting for distortion by right rotation). No edema or consolidation. No effusion or pneumothorax.  IMPRESSION: No active disease.   Electronically Signed   By: Audry RilesJonathan  Watts M.D.  On: 08/08/2013 01:29   Medications:   . amLODipine  5 mg Oral Daily  . aspirin  81 mg Oral Daily  . calcitRIOL  0.25 mcg Oral Daily  . carvedilol  25 mg Oral BID WC  . cholecalciferol  400 Units Oral Daily  . darbepoetin (ARANESP) injection - NON-DIALYSIS  100 mcg Subcutaneous Q Wed-1800  . enoxaparin (LOVENOX) injection  30 mg Subcutaneous Q24H  . furosemide  40 mg Oral BID  . glipiZIDE  5 mg Oral Q breakfast  . multivitamin  1 tablet Oral QHS  . omega-3 acid ethyl esters  1 g Oral Daily  . pantoprazole  40 mg Oral Daily  . piperacillin-tazobactam (ZOSYN)  IV  3.375 g Intravenous Q8H  . pravastatin  40 mg Oral Daily  . sodium bicarbonate  650 mg Oral TID  . vancomycin  750 mg Intravenous Q24H    I  have reviewed scheduled and prn medications.  Assesssment: 43 y.o. year-old male with background of DM, HTN, CAD, ischemic cardiomyopathy, gout. Known advanced CKD who recently (3/10) underwent a right fem pop, common femoral and external iliac endarterectomy and  profundaplasty on 3/10. During that hospital admission his creatinine ranged anywhere from 4.8 to 6.5. There was a discussion about access placement, with the decision to do as outpt once "over that surgery" and he was to have followed up with Dr. Lowell Guitar at [facility] on 4/2. Labs done in anticipation of that visit showed a potassium of 6.1 (although the message was that K was 6.9), creatinine of 5.3 - the on call MD asked him to go to the ED for his potassium. Incidentally he had a WBC of 15,300.   When evaluated in the ED he was found to have right leg swelling and drainage from the right groin - was seen by [facility] who drained a lymphocele in the proximal right thigh, and started vanco and zosyn for the cellulitis in the right groin, in that his repair last month included prosthetic material. We were asked to see for management of renal related issues  Impression/Plan  1. CKD5 - no access as plan was to wait until healed from fem pop. That course may now be more prolonged with the groin cellulitis. No overt uremic symptoms - I think twitching was due to gabapentin (says started coincident with that as new med in last 2 weeks - was not reordered on admission and symptoms better). Hopefully K can be controlled with conservative measures and dietary restriction; readdress with VVS timing of access. May have to go to "plan B" .Marland KitchenMarland KitchenWill go ahead with vein mapping (don't see that its ever been done) for completeness in case access this admission 2. Hyperkalemia - d/t CKD 5; improved after kayexelate but drifting backup; diet restrict; small dose kayexelate today 3. 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 Continue oral calcitriol; phos up; add binder (Renvela) 5. Hypertension - continue home meds 6. CAD - by cath 2013; med Rx and risk factor  reduction 7. Anemia - Start Darbe 100/week for Hb under 10 8. Neuropathy - do not restart gabapentin; will see if twitching and jerking improve in its absence as those symptoms coincided with its initiation   Camille Bal, MD [facility] [phone] pager 08/09/2013, 9:54 AM

## 2013-08-10 LAB — URINALYSIS, ROUTINE W REFLEX MICROSCOPIC
Bilirubin Urine: NEGATIVE
Glucose, UA: NEGATIVE mg/dL
Ketones, ur: NEGATIVE mg/dL
Leukocytes, UA: NEGATIVE
NITRITE: NEGATIVE
Specific Gravity, Urine: 1.011 (ref 1.005–1.030)
Urobilinogen, UA: 0.2 mg/dL (ref 0.0–1.0)
pH: 7 (ref 5.0–8.0)

## 2013-08-10 LAB — GLUCOSE, CAPILLARY
GLUCOSE-CAPILLARY: 84 mg/dL (ref 70–99)
GLUCOSE-CAPILLARY: 145 mg/dL — AB (ref 70–99)
Glucose-Capillary: 64 mg/dL — ABNORMAL LOW (ref 70–99)
Glucose-Capillary: 128 mg/dL — ABNORMAL HIGH (ref 70–99)
Glucose-Capillary: 151 mg/dL — ABNORMAL HIGH (ref 70–99)

## 2013-08-10 LAB — RENAL FUNCTION PANEL
Albumin: 2.1 g/dL — ABNORMAL LOW (ref 3.5–5.2)
BUN: 69 mg/dL — AB (ref 6–23)
CHLORIDE: 99 meq/L (ref 96–112)
CO2: 23 mEq/L (ref 19–32)
Calcium: 8.9 mg/dL (ref 8.4–10.5)
Creatinine, Ser: 5.13 mg/dL — ABNORMAL HIGH (ref 0.50–1.35)
GFR calc Af Amer: 14 mL/min — ABNORMAL LOW (ref >90)
GFR calc non Af Amer: 12 mL/min — ABNORMAL LOW (ref >90)
GLUCOSE: 75 mg/dL (ref 70–99)
POTASSIUM: 4.8 meq/L (ref 3.7–5.3)
Phosphorus: 6.5 mg/dL — ABNORMAL HIGH (ref 2.3–4.6)
Sodium: 139 mEq/L (ref 137–147)

## 2013-08-10 LAB — URINE MICROSCOPIC-ADD ON

## 2013-08-10 LAB — CBC
HEMATOCRIT: 27.3 % — AB (ref 39.0–52.0)
HEMOGLOBIN: 9.1 g/dL — AB (ref 13.0–17.0)
MCH: 29.6 pg (ref 26.0–34.0)
MCHC: 33.3 g/dL (ref 30.0–36.0)
MCV: 88.9 fL (ref 78.0–100.0)
Platelets: 415 10*3/uL — ABNORMAL HIGH (ref 150–400)
RBC: 3.07 MIL/uL — AB (ref 4.22–5.81)
RDW: 13.2 % (ref 11.5–15.5)
WBC: 10.8 10*3/uL — AB (ref 4.0–10.5)

## 2013-08-10 NOTE — Progress Notes (Signed)
Removed staples from rt leg per MD order per hospital policy. Patient tolerated well. Applied benzoin to right leg. Will continue to monitor closely. Lajuana Matte, RN

## 2013-08-10 NOTE — Progress Notes (Addendum)
[facility] Progress Note  08/10/2013 7:58 AM Hospital Day 3  Subjective:  States he is ready to go home; dressing off of right groin and is wet with fibrinous tissue around the wound.  Afebrile VSS 98% RA  Filed Vitals:   08/10/13 0443  BP: 145/78  Pulse: 81  Temp: 98.6 F (37 C)  Resp: 18    Physical Exam:  Lungs:  Non labored Extremities:  Right foot is warm; still with some swelling in the RLE Incisions:  Right groin still with fibrinous tissue and some drainage; proximal saphenous vein harvest site now with serous drainage and is softer at that incision now that yesterday.  CBC    Component Value Date/Time   WBC 10.8* 08/10/2013 0325   RBC 3.07* 08/10/2013 0325   HGB 9.1* 08/10/2013 0325   HCT 27.3* 08/10/2013 0325   PLT 415* 08/10/2013 0325   MCV 88.9 08/10/2013 0325   MCH 29.6 08/10/2013 0325   MCHC 33.3 08/10/2013 0325   RDW 13.2 08/10/2013 0325   LYMPHSABS 2.1 08/08/2013 0055   MONOABS 1.1* 08/08/2013 0055   EOSABS 0.3 08/08/2013 0055   BASOSABS 0.0 08/08/2013 0055    BMET    Component Value Date/Time   NA 139 08/10/2013 0325   K 4.8 08/10/2013 0325   CL 99 08/10/2013 0325   CO2 23 08/10/2013 0325   GLUCOSE 75 08/10/2013 0325   BUN 69* 08/10/2013 0325   CREATININE 5.13* 08/10/2013 0325   CALCIUM 8.9 08/10/2013 0325   GFRNONAA 12* 08/10/2013 0325   GFRAA 14* 08/10/2013 0325    INR    Component Value Date/Time   INR 1.19 08/08/2013 0855     Intake/Output Summary (Last 24 hours) at 08/10/13 0758 Last data filed at 08/10/13 0403  Gross per 24 hour  Intake    480 ml  Output   1000 ml  Net   -520 ml     Assessment/Plan:  43 y.o. male is  right femoral to popliteal bypass with propaten, CFA and EIA endarterectomy and extended profundoplasty 07/17/13   Hospital Day 3  -right groin wound culture re-incubated for better growth. -will need frequent dressing changes as wound is draining. -pt is afebrile and WBC is more improved this am-continue ABx -hgb improved this am  as well as his BUN/creatinine -hyperkalemia improved today also -Lovenox 30 mg for DVT prophylaxis -continue good glucose control-appreciate DM coordinator help -will restrict left arm for future access placement -remove staples from right leg   Doreatha Massed, PA-C [facility] [phone] 08/10/2013 7:58 AM  I have examined the patient, reviewed and agree with above. The wound from the distal thigh down is healing quite nicely. Staples will be removed today. He does continue to have a great deal of clear serous drainage from his upper thigh and groin incision. There is no evidence of fluctuance. We will remove all [place] to staples from these as well today. Explained to the patient the importance for elevation when possible and the keeping this area dry. Continue with IV antibiotics.  Porschea Borys, MD 08/10/2013 9:50 AM

## 2013-08-10 NOTE — Progress Notes (Signed)
Subjective:  Twitching has totally resolved off gabapentin Wound draining more  Objective Vital signs in last 24 hours: Filed Vitals:   08/09/13 0750 08/09/13 1530 08/09/13 2058 08/10/13 0443  BP: 150/74 146/72 167/84 145/78  Pulse: 83 80 87 81  Temp:   98.3 F (36.8 C) 98.6 F (37 C)  TempSrc:   Oral Oral  Resp:   18 18  Height:      Weight:    97.5 kg (214 lb 15.2 oz)  SpO2:   100% 98%   Weight change: -0.613 kg (-1 lb 5.6 oz)  Intake/Output Summary (Last 24 hours) at 08/10/13 0935 Last data filed at 08/10/13 0403  Gross per 24 hour  Intake    480 ml  Output   1000 ml  Net   -520 ml   Physical Exam:  Blood pressure 150/74, pulse 83, temperature 98.5 F (36.9 C), temperature source Oral, resp. rate 18, height 5\' 9"  (1.753 m), weight 98.113 kg (216 lb 4.8 oz), SpO2 100.00%. Gen: very nice AAM NAD VS as noted  Neck: no JVD  Chest: clear without crackles or wheezes  Heart: regular, no rub or gallop  Abdomen: soft, no focal abd tenderness  Ext: no LLE edema RLD with long incision, scaling, groin wound dressed; right great toe dry gangrenous change  Neuro: alert, Ox3, no twitching or asterixus   Recent Labs Lab 08/08/13 0055 08/08/13 0105 08/08/13 0855 08/08/13 1525 08/09/13 0609 08/10/13 0325  NA 132* 132* 133* 136* 138 139  K 6.1* 5.9* 5.9* 5.2 5.5* 4.8  CL 96 101 97 100 100 99  CO2 18*  --  20 20 22 23   GLUCOSE 211* 218* 223* 182* 74 75  BUN 77* 94* 83* 76* 74* 69*  CREATININE 5.62* 6.40* 5.61* 5.58* 5.31* 5.13*  CALCIUM 8.6  --  9.0 8.4 9.2 8.9  PHOS  --   --   --   --  6.7* 6.5*    Recent Labs Lab 08/08/13 0055 08/08/13 0855 08/09/13 0609 08/10/13 0325  AST 11 11  --   --   ALT 10 9  --   --   ALKPHOS 63 57  --   --   BILITOT <0.2* 0.2*  --   --   PROT 6.8 6.7  --   --   ALBUMIN 2.3* 2.3* 2.0* 2.1*   Recent Labs Lab 08/08/13 0055 08/08/13 0105 08/08/13 0855 08/09/13 0609 08/10/13 0325  WBC 15.9*  --  14.3* 11.7* 10.8*  NEUTROABS 12.4*   --   --   --   --   HGB 8.8* 9.5* 9.0* 8.8* 9.1*  HCT 26.8* 28.0* 26.8* 26.5* 27.3*  MCV 90.8  --  89.9 89.2 88.9  PLT 453*  --  385 391 415*    Recent Labs Lab 08/09/13 1756 08/09/13 2057 08/09/13 2301 08/10/13 0620 08/10/13 0715  GLUCAP 79 67* 77 64* 84   Studies/Results: No results found. Medications:   . amLODipine  5 mg Oral Daily  . aspirin  81 mg Oral Daily  . calcitRIOL  0.25 mcg Oral Daily  . carvedilol  25 mg Oral BID WC  . cholecalciferol  400 Units Oral Daily  . darbepoetin (ARANESP) injection - NON-DIALYSIS  100 mcg Subcutaneous Q Wed-1800  . enoxaparin (LOVENOX) injection  30 mg Subcutaneous Q24H  . furosemide  40 mg Oral BID  . insulin aspart  0-9 Units Subcutaneous TID WC  . multivitamin  1 tablet Oral QHS  .  omega-3 acid ethyl esters  1 g Oral Daily  . pantoprazole  40 mg Oral Daily  . piperacillin-tazobactam (ZOSYN)  IV  2.25 g Intravenous Q8H  . pravastatin  40 mg Oral Daily  . sevelamer carbonate  800 mg Oral TID WC  . sodium bicarbonate  650 mg Oral TID    I  have reviewed scheduled and prn medications.  Assesssment: 43 y.o. year-old male with background of DM, HTN, CAD, ischemic cardiomyopathy, gout. Known advanced CKD who recently (3/10) underwent a right fem pop, common femoral and external iliac endarterectomy and profundaplasty on 3/10. During that hospital admission his creatinine ranged anywhere from 4.8 to 6.5. There was a discussion about access placement, with the decision to do as outpt once "over that surgery" and he was to have followed up with Dr. Lowell Guitar at [facility] on 4/2. Labs done in anticipation of that visit showed a potassium of 6.1 (although the message was that K was 6.9), creatinine of 5.3 - the on call MD asked him to go to the ED for his potassium. Incidentally he had a WBC of 15,300.   When evaluated in the ED he was found to have right leg swelling and drainage from the right groin - was seen by [facility] who drained a lymphocele in the  proximal right thigh, and started vanco and zosyn for the cellulitis in the right groin, in that his repair last month included prosthetic material. We were asked to see for management of renal related issues  Impression/Plan  1. CKD5 - no access as plan was to wait until healed from fem pop. That course may now be more prolonged with the groin cellulitis. No overt uremic symptoms - I think twitching was due to gabapentin (says started coincident with that as new med in last 2 weeks - was not reordered on admission and symptoms better). Hopefully K can be controlled with conservative measures and dietary restriction; readdress with [facility] timing of access. May have to go to "[facility]" Vein mapping done - access on hold with infected groin wound.   2. Hyperkalemia - d/t CKD 5; improved after kayexelate but drifting backup; diet restricted; 15 gm layex yesterday - looks fine today; monitor 3. 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 Continue oral calcitriol; phos up; added binder (Renvela) 5. Hypertension - continue home meds 6. CAD - by cath 2013; med Rx and risk factor reduction 7. Anemia - Started Darbe 100/week for Hb under 10 8. Neuropathy - do not restart gabapentin; twitching has entirely resolved  Camille Bal, MD [facility] [phone] pager 08/10/2013, 9:35 AM

## 2013-08-11 LAB — GLUCOSE, CAPILLARY
GLUCOSE-CAPILLARY: 189 mg/dL — AB (ref 70–99)
GLUCOSE-CAPILLARY: 141 mg/dL — AB (ref 70–99)
Glucose-Capillary: 115 mg/dL — ABNORMAL HIGH (ref 70–99)
Glucose-Capillary: 146 mg/dL — ABNORMAL HIGH (ref 70–99)
Glucose-Capillary: 158 mg/dL — ABNORMAL HIGH (ref 70–99)

## 2013-08-11 LAB — CBC
HCT: 25.8 % — ABNORMAL LOW (ref 39.0–52.0)
Hemoglobin: 8.6 g/dL — ABNORMAL LOW (ref 13.0–17.0)
MCH: 29.8 pg (ref 26.0–34.0)
MCHC: 33.3 g/dL (ref 30.0–36.0)
MCV: 89.3 fL (ref 78.0–100.0)
PLATELETS: 384 10*3/uL (ref 150–400)
RBC: 2.89 MIL/uL — AB (ref 4.22–5.81)
RDW: 13 % (ref 11.5–15.5)
WBC: 10.4 10*3/uL (ref 4.0–10.5)

## 2013-08-11 LAB — RENAL FUNCTION PANEL
ALBUMIN: 2 g/dL — AB (ref 3.5–5.2)
BUN: 60 mg/dL — ABNORMAL HIGH (ref 6–23)
CHLORIDE: 99 meq/L (ref 96–112)
CO2: 22 mEq/L (ref 19–32)
CREATININE: 4.81 mg/dL — AB (ref 0.50–1.35)
Calcium: 8.7 mg/dL (ref 8.4–10.5)
GFR, EST AFRICAN AMERICAN: 16 mL/min — AB (ref >90)
GFR, EST NON AFRICAN AMERICAN: 13 mL/min — AB (ref >90)
Glucose, Bld: 149 mg/dL — ABNORMAL HIGH (ref 70–99)
POTASSIUM: 4.9 meq/L (ref 3.7–5.3)
Phosphorus: 6 mg/dL — ABNORMAL HIGH (ref 2.3–4.6)
SODIUM: 138 meq/L (ref 137–147)

## 2013-08-11 LAB — VANCOMYCIN, RANDOM: VANCOMYCIN RM: 10.2 ug/mL

## 2013-08-11 MED ORDER — VANCOMYCIN HCL IN DEXTROSE 1-5 GM/200ML-% IV SOLN
1000.0000 mg | Freq: Once | INTRAVENOUS | Status: AC
  Administered 2013-08-11: 1000 mg via INTRAVENOUS
  Filled 2013-08-11: qty 200

## 2013-08-11 NOTE — Progress Notes (Signed)
Subjective: Interval History: none..   Objective: Vital signs in last 24 hours: Temp:  [98.3 F (36.8 C)-98.6 F (37 C)] 98.3 F (36.8 C) (04/04 0300) Pulse Rate:  [79-81] 80 (04/04 0300) Resp:  [16-18] 16 (04/04 0300) BP: (149-162)/(70-81) 159/70 mmHg (04/04 0300) SpO2:  [99 %-100 %] 99 % (04/04 0300) Weight:  [210 lb 12.2 oz (95.6 kg)] 210 lb 12.2 oz (95.6 kg) (04/04 0500)  Intake/Output from previous day: 04/03 0701 - 04/04 0700 In: 840 [P.O.:840] Out: 1601 [Urine:1600; Stool:1] Intake/Output this shift:    Remains afebrile. Staples out of right leg incision. Markedly diminished erythema in the groin wound. Still with clear serous drainage but this is decreased as well.  Lab Results:  Recent Labs  08/10/13 0325 08/11/13 0509  WBC 10.8* 10.4  HGB 9.1* 8.6*  HCT 27.3* 25.8*  PLT 415* 384   BMET  Recent Labs  08/10/13 0325 08/11/13 0509  NA 139 138  K 4.8 4.9  CL 99 99  CO2 23 22  GLUCOSE 75 149*  BUN 69* 60*  CREATININE 5.13* 4.81*  CALCIUM 8.9 8.7    Studies/Results: Dg Chest 2 View  07/17/2013   CLINICAL DATA:  Hypertension.  Preop  EXAM: CHEST  2 VIEW  COMPARISON:  03/27/2012  FINDINGS: Normal heart size and mediastinal contours are within normal limits from minor hypoaeration. Hiatal. No acute infiltrate or edema. No effusion or pneumothorax. No acute osseous findings.  IMPRESSION: No active cardiopulmonary disease.   Electronically Signed   By: Tiburcio Pea M.D.   On: 07/17/2013 06:59   Dg Chest Port 1 View  08/08/2013   CLINICAL DATA:  Shortness of breath.  EXAM: PORTABLE CHEST - 1 VIEW  COMPARISON:  07/17/2013  FINDINGS: Normal heart size and unchanged mediastinal contours (when accounting for distortion by right rotation). No edema or consolidation. No effusion or pneumothorax.  IMPRESSION: No active disease.   Electronically Signed   By: Tiburcio Pea M.D.   On: 08/08/2013 01:29   Anti-infectives: Anti-infectives   Start     Dose/Rate Route  Frequency Ordered Stop   08/09/13 1600  piperacillin-tazobactam (ZOSYN) IVPB 2.25 g     2.25 g 100 mL/hr over 30 Minutes Intravenous Every 8 hours 08/09/13 1348     08/09/13 0400  vancomycin (VANCOCIN) IVPB 750 mg/150 ml premix  Status:  Discontinued     750 mg 150 mL/hr over 60 Minutes Intravenous Every 24 hours 08/08/13 0457 08/09/13 1350   08/08/13 0800  piperacillin-tazobactam (ZOSYN) IVPB 3.375 g  Status:  Discontinued     3.375 g 12.5 mL/hr over 240 Minutes Intravenous Every 8 hours 08/08/13 0457 08/09/13 1348   08/08/13 0245  piperacillin-tazobactam (ZOSYN) IVPB 3.375 g     3.375 g 100 mL/hr over 30 Minutes Intravenous  Once 08/08/13 0233 08/08/13 0340   08/08/13 0245  vancomycin (VANCOCIN) IVPB 1000 mg/200 mL premix     1,000 mg 200 mL/hr over 60 Minutes Intravenous  Once 08/08/13 0233 08/08/13 0449      Assessment/Plan: s/p * No surgery found * Continued improvement. Remains afebrile. Discussed with the patient and his wife present this morning. We'll continue with antibiotics and elevation. Again discussed the concern about the potential for this involving into graft infection which there is no evidence currently.   LOS: 3 days   Harris Penton 08/11/2013, 9:58 AM

## 2013-08-11 NOTE — Progress Notes (Signed)
ANTIBIOTIC CONSULT NOTE - FOLLOW UP  Pharmacy Consult for Vancomycin Indication: right groin infection  Allergies  Allergen Reactions  . Vytorin [Ezetimibe-Simvastatin]     Weakness, muscle aches bad.  . Oxycodone-Acetaminophen Nausea And Vomiting    Vomiting     Patient Measurements: Height: 5\' 9"  (175.3 cm) Weight: 210 lb 12.2 oz (95.6 kg) IBW/kg (Calculated) : 70.7  Vital Signs: Temp: 98.3 F (36.8 C) (04/04 0300) Temp src: Oral (04/04 0300) BP: 159/70 mmHg (04/04 0300) Pulse Rate: 80 (04/04 0300) Intake/Output from previous day: 04/03 0701 - 04/04 0700 In: 840 [P.O.:840] Out: 1601 [Urine:1600; Stool:1] Intake/Output from this shift:    Labs:  Recent Labs  08/09/13 0609 08/10/13 0325 08/11/13 0509  WBC 11.7* 10.8* 10.4  HGB 8.8* 9.1* 8.6*  PLT 391 415* 384  CREATININE 5.31* 5.13* 4.81*   Estimated Creatinine Clearance: 22.4 ml/min (by C-G formula based on Cr of 4.81).  Recent Labs  08/11/13 0509  VANCORANDOM 10.2    Assessment: 43yom s/p right fem pop, common femoral and external iliac endarterectomy and profundaplasty on 3/10 now with right groin cellulitis. 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 Random vancomycin level drawn this morning is therapeutic at 10.3. His clearance of vancomycin is still ~ 45 hours, however, serum creatinine is trending down. Zosyn dose remains appropriate.  4/1 vanc 1 gm at 0326 4/2 vanc 750mg  at 0331 4/4 VR = 10.3  Zosyn 4/1>> vanc 4/1>> 4/2 wound cx >> gram + cocci in pairs    Goal of Therapy:  Vancomycin trough level 10-15 mcg/ml  Plan:  1) Give vancomycin 1g x 1 today 2) Continue zosyn 2.25g IV q8 3) Continue to follow renal function  Fredrik Rigger 08/11/2013,10:03 AM

## 2013-08-11 NOTE — Progress Notes (Signed)
[facility] ROUNDING NOTE  SUMMARY ASSESSMENT 43 y.o. year-old male with background of DM, HTN, CAD, ischemic cardiomyopathy, gout. Known advanced CKD (baseline most recently 5-6) who underwent a right fem pop, common femoral and external iliac endarterectomy and profundaplasty with graft material placed (3/10) . During that admission his creatinine ranged anywhere from 4.8 to 6.5. There was a discussion about access placement, with the decision to do as outpt once "over that surgery" and he was to have followed up with Dr. Lowell [facility]uitarPowell [facility] on 4/2. Labs done in anticipation of that visit showed a potassium of 6.1 (although the message was that K was 6.9), creatinine of 5.3 - on call MD asked him to go to the ED for his potassium. Incidentally he had a WBC of 15,300.   ED evaluation revealed  right leg swelling and drainage from the right groin - was seen by [facility] who drained a lymphocele in the proximal right thigh, and started vanco and zosyn for the cellulitis in the right groin, in that his repair last month included prosthetic material. We were asked to see for management of renal related issues  Impression/Plans  1. CKD5 - no access as plan was to wait until healed from fem pop. That course may now be more prolonged with the groin cellulitis. No overt uremic symptoms - I think twitching was due to gabapentin (says started coincident with that as new med in last 2 weeks - was not reordered on admission and symptoms better). K  controlled with conservative measures and dietary restriction; readdressed with VVS timing of access - per VVS extender - they want to wait until this current infection is resolved. Fortunately renal stable.  Vein mapping done this admission.    2. Hyperkalemia - d/t CKD 5; improved after kayexalate and got another 15 grams on 4/2.  Good today 3. 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 Continue oral calcitriol; phos up; added binder (Renvela) 5. Hypertension - continue home meds 6. CAD - by cath 2013; med Rx and risk factor reduction 7. Anemia - Started Darbe 100/week for Hb under 10 8. Neuropathy - do not restart gabapentin; twitching has entirely resolved   Subjective:  No more twitching off gabapentin Getting aggressive dressing changes to wound  Objective Vital signs in last 24 hours: Filed Vitals:   08/10/13 1358 08/10/13 2159 08/11/13 0300 08/11/13 0500  BP: 149/76 162/81 159/70   Pulse: 79 81 80   Temp: 98.6 F (37 C) 98.6 F (37 C) 98.3 F (36.8 C)   TempSrc: Oral Oral Oral   Resp: 18 18 16    Height:      Weight:    95.6 kg (210 lb 12.2 oz)  SpO2: 100% 100% 99%    Weight change: -1.9 kg (-4 lb 3 oz)  Intake/Output Summary (Last 24 hours) at 08/11/13 1030 Last data filed at 08/11/13 0500  Gross per 24 hour  Intake    600 ml  Output   1601 ml  Net  -1001 ml   Physical Exam:  Blood pressure 150/74, pulse 83, temperature 98.5 F (36.9 C), temperature source Oral, resp. rate 18, height 5\' 9"  (1.753 m), weight 98.113 kg (216 lb 4.8 oz), SpO2 100.00%. Gen: very nice AAM NAD VS as noted  Neck: no JVD  Chest: clear  Heart: regular, no  rub or gallop  Abdomen: soft, no focal abd tenderness  Ext: no LLE edema RLD with long incision, scaling, groin wound dressed; right great toe dry gangrenous changes no different  Neuro: alert, Ox3, no twitching or asterixus   Recent Labs Lab 08/08/13 0055 08/08/13 0105 08/08/13 0855 08/08/13 1525 08/09/13 0609 08/10/13 0325 08/11/13 0509  NA 132* 132* 133* 136* 138 139 138  K 6.1* 5.9* 5.9* 5.2 5.5* 4.8 4.9  CL 96 101 97 100 100 99 99  CO2 18*  --  20 20 22 23 22   GLUCOSE 211* 218* 223* 182* 74 75 149*  BUN 77* 94* 83* 76* 74* 69* 60*  CREATININE 5.62* 6.40* 5.61* 5.58* 5.31* 5.13* 4.81*  CALCIUM 8.6  --  9.0 8.4 9.2 8.9 8.7  PHOS  --   --   --   --  6.7*  6.5* 6.0*    Recent Labs Lab 08/08/13 0055 08/08/13 0855 08/09/13 0609 08/10/13 0325 08/11/13 0509  AST 11 11  --   --   --   ALT 10 9  --   --   --   ALKPHOS 63 57  --   --   --   BILITOT <0.2* 0.2*  --   --   --   PROT 6.8 6.7  --   --   --   ALBUMIN 2.3* 2.3* 2.0* 2.1* 2.0*   Recent Labs Lab 08/08/13 0055  08/08/13 0855 08/09/13 0609 08/10/13 0325 08/11/13 0509  WBC 15.9*  --  14.3* 11.7* 10.8* 10.4  NEUTROABS 12.4*  --   --   --   --   --   HGB 8.8*  < > 9.0* 8.8* 9.1* 8.6*  HCT 26.8*  < > 26.8* 26.5* 27.3* 25.8*  MCV 90.8  --  89.9 89.2 88.9 89.3  PLT 453*  --  385 391 415* 384  < > = values in this interval not displayed.  Recent Labs Lab 08/10/13 1130 08/10/13 1618 08/10/13 2155 08/11/13 0202 08/11/13 0525  GLUCAP 128* 151* 145* 115* 146*   Studies/Results: No results found. Medications:   . amLODipine  5 mg Oral Daily  . aspirin  81 mg Oral Daily  . calcitRIOL  0.25 mcg Oral Daily  . carvedilol  25 mg Oral BID WC  . cholecalciferol  400 Units Oral Daily  . darbepoetin (ARANESP) injection - NON-DIALYSIS  100 mcg Subcutaneous Q Wed-1800  . enoxaparin (LOVENOX) injection  30 mg Subcutaneous Q24H  . furosemide  40 mg Oral BID  . insulin aspart  0-9 Units Subcutaneous TID WC  . multivitamin  1 tablet Oral QHS  . omega-3 acid ethyl esters  1 g Oral Daily  . pantoprazole  40 mg Oral Daily  . piperacillin-tazobactam (ZOSYN)  IV  2.25 g Intravenous Q8H  . pravastatin  40 mg Oral Daily  . sevelamer carbonate  800 mg Oral TID WC  . sodium bicarbonate  650 mg Oral TID  . vancomycin  1,000 mg Intravenous Once    Camille Bal, MD [facility] [phone] pager 08/11/2013, 10:30 AM

## 2013-08-12 LAB — RENAL FUNCTION PANEL
ALBUMIN: 2.1 g/dL — AB (ref 3.5–5.2)
BUN: 54 mg/dL — AB (ref 6–23)
CO2: 23 mEq/L (ref 19–32)
CREATININE: 4.85 mg/dL — AB (ref 0.50–1.35)
Calcium: 8.9 mg/dL (ref 8.4–10.5)
Chloride: 100 mEq/L (ref 96–112)
GFR calc Af Amer: 15 mL/min — ABNORMAL LOW (ref >90)
GFR calc non Af Amer: 13 mL/min — ABNORMAL LOW (ref >90)
Glucose, Bld: 115 mg/dL — ABNORMAL HIGH (ref 70–99)
Phosphorus: 5.8 mg/dL — ABNORMAL HIGH (ref 2.3–4.6)
Potassium: 4.9 mEq/L (ref 3.7–5.3)
Sodium: 139 mEq/L (ref 137–147)

## 2013-08-12 LAB — GLUCOSE, CAPILLARY
Glucose-Capillary: 104 mg/dL — ABNORMAL HIGH (ref 70–99)
Glucose-Capillary: 212 mg/dL — ABNORMAL HIGH (ref 70–99)
Glucose-Capillary: 192 mg/dL — ABNORMAL HIGH (ref 70–99)
Glucose-Capillary: 171 mg/dL — ABNORMAL HIGH (ref 70–99)

## 2013-08-12 MED ORDER — AMLODIPINE BESYLATE 10 MG PO TABS
10.0000 mg | ORAL_TABLET | Freq: Every day | ORAL | Status: DC
  Administered 2013-08-12 – 2013-08-14 (×3): 10 mg via ORAL
  Filled 2013-08-12 (×3): qty 1

## 2013-08-12 MED ORDER — VANCOMYCIN HCL IN DEXTROSE 1-5 GM/200ML-% IV SOLN
1000.0000 mg | INTRAVENOUS | Status: DC
  Administered 2013-08-13: 1000 mg via INTRAVENOUS
  Filled 2013-08-12: qty 200

## 2013-08-12 NOTE — Progress Notes (Signed)
ANTIBIOTIC CONSULT NOTE - FOLLOW UP  Pharmacy Consult for Vancomycin/zosyn Indication: right groin infection  Allergies  Allergen Reactions  . Vytorin [Ezetimibe-Simvastatin]     Weakness, muscle aches bad.  . Oxycodone-Acetaminophen Nausea And Vomiting    Vomiting     Patient Measurements: Height: 5\' 9"  (175.3 cm) Weight: 210 lb 12.2 oz (95.6 kg) IBW/kg (Calculated) : 70.7  Vital Signs: Temp: 97.9 F (36.6 C) (04/05 0343) Temp src: Oral (04/05 0343) BP: 163/77 mmHg (04/05 0720) Pulse Rate: 74 (04/05 0720) Intake/Output from previous day: 04/04 0701 - 04/05 0700 In: 1320 [P.O.:720; IV Piggyback:600] Out: 2025 [Urine:2025] Intake/Output from this shift: Total I/O In: 240 [P.O.:240] Out: 500 [Urine:500]  Labs:  Recent Labs  08/10/13 0325 08/11/13 0509 08/12/13 0325  WBC 10.8* 10.4  --   HGB 9.1* 8.6*  --   PLT 415* 384  --   CREATININE 5.13* 4.81* 4.85*   Estimated Creatinine Clearance: 22.2 ml/min (by C-G formula based on Cr of 4.85).  Recent Labs  08/11/13 0509  VANCORANDOM 10.2    Assessment: 43yom s/p right fem pop, common femoral and external iliac endarterectomy and profundaplasty on 3/10 now with right groin cellulitis. 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  WBC down to 10.4, AF, per MD groin wounds with marked resolution of erythema.  4/2 wound cx = staph aureus Creat 4.85 - stable.   4/1 vanc 1 gm at 0326 4/2 vanc 750mg  at 0331 4/4 VR = 10.3, vanc 1 gm given at 1208 4/5 vanc 1 gm q48 to start 4/6 at 12noon  Zosyn 4/1>> vanc 4/1>> 4/2 wound cx >> staph aureus   Goal of Therapy:  Vancomycin trough level 10-15 mcg/ml  Plan:  1) Continue zosyn 2.25g IV q8 2) start vancomycin 1 gm q48 on Monday 4/6 at 12 noon 3) f/u sens of staph aureus in wound to direct oral abx therapy Herby Abraham, Pharm.D. [phone] 08/12/2013 11:16 AM

## 2013-08-12 NOTE — Progress Notes (Signed)
Subjective: Interval History: none.. comfortable  Objective: Vital signs in last 24 hours: Temp:  [97.9 F (36.6 C)-98.3 F (36.8 C)] 97.9 F (36.6 C) (04/05 0343) Pulse Rate:  [74-95] 74 (04/05 0720) Resp:  [16-17] 16 (04/05 0343) BP: (155-165)/(71-83) 163/77 mmHg (04/05 0720) SpO2:  [98 %-100 %] 98 % (04/05 0343)  Intake/Output from previous day: 04/04 0701 - 04/05 0700 In: 1320 [P.O.:720; IV Piggyback:600] Out: 2025 [Urine:2025] Intake/Output this shift:    Physical exam: remains afebrile Palpable right popliteal pulse. Incisions through calf and thigh looked very good. Groin wounds with marked resolution of erythema. Still with clear serous drainage. No fluctuance.  Lab Results:  Recent Labs  08/10/13 0325 08/11/13 0509  WBC 10.8* 10.4  HGB 9.1* 8.6*  HCT 27.3* 25.8*  PLT 415* 384   BMET  Recent Labs  08/11/13 0509 08/12/13 0325  NA 138 139  K 4.9 4.9  CL 99 100  CO2 22 23  GLUCOSE 149* 115*  BUN 60* 54*  CREATININE 4.81* 4.85*  CALCIUM 8.7 8.9    Studies/Results: Dg Chest 2 View  07/17/2013   CLINICAL DATA:  Hypertension.  Preop  EXAM: CHEST  2 VIEW  COMPARISON:  03/27/2012  FINDINGS: Normal heart size and mediastinal contours are within normal limits from minor hypoaeration. Hiatal. No acute infiltrate or edema. No effusion or pneumothorax. No acute osseous findings.  IMPRESSION: No active cardiopulmonary disease.   Electronically Signed   By: Tiburcio Pea M.D.   On: 07/17/2013 06:59   Dg Chest Port 1 View  08/08/2013   CLINICAL DATA:  Shortness of breath.  EXAM: PORTABLE CHEST - 1 VIEW  COMPARISON:  07/17/2013  FINDINGS: Normal heart size and unchanged mediastinal contours (when accounting for distortion by right rotation). No edema or consolidation. No effusion or pneumothorax.  IMPRESSION: No active disease.   Electronically Signed   By: Tiburcio Pea M.D.   On: 08/08/2013 01:29   Anti-infectives: Anti-infectives   Start     Dose/Rate Route  Frequency Ordered Stop   08/11/13 1015  vancomycin (VANCOCIN) IVPB 1000 mg/200 mL premix     1,000 mg 200 mL/hr over 60 Minutes Intravenous  Once 08/11/13 1008 08/11/13 1308   08/09/13 1600  piperacillin-tazobactam (ZOSYN) IVPB 2.25 g     2.25 g 100 mL/hr over 30 Minutes Intravenous Every 8 hours 08/09/13 1348     08/09/13 0400  vancomycin (VANCOCIN) IVPB 750 mg/150 ml premix  Status:  Discontinued     750 mg 150 mL/hr over 60 Minutes Intravenous Every 24 hours 08/08/13 0457 08/09/13 1350   08/08/13 0800  piperacillin-tazobactam (ZOSYN) IVPB 3.375 g  Status:  Discontinued     3.375 g 12.5 mL/hr over 240 Minutes Intravenous Every 8 hours 08/08/13 0457 08/09/13 1348   08/08/13 0245  piperacillin-tazobactam (ZOSYN) IVPB 3.375 g     3.375 g 100 mL/hr over 30 Minutes Intravenous  Once 08/08/13 0233 08/08/13 0340   08/08/13 0245  vancomycin (VANCOCIN) IVPB 1000 mg/200 mL premix     1,000 mg 200 mL/hr over 60 Minutes Intravenous  Once 08/08/13 0233 08/08/13 0449      Assessment/Plan: s/p * No surgery found * Stable overall. Continued with antibiotics and limited activity. Marked resolution of erythema throughout his groin.   LOS: 4 days   Jessicia Napolitano 08/12/2013, 9:02 AM

## 2013-08-12 NOTE — Progress Notes (Signed)
[facility] ROUNDING NOTE  SUMMARY ASSESSMENT 43 y.o. year-old male with background of DM, HTN, CAD, ischemic cardiomyopathy, gout. Known advanced CKD (baseline most recently 5-6) who underwent a right fem pop, common femoral and external iliac endarterectomy and profundaplasty with graft material placed (3/10) . During that admission his creatinine ranged anywhere from 4.8 to 6.5. There was a discussion about access placement, with the decision to do as outpt once "over that surgery" and he was to have followed up with [facility]. Labs done in anticipation of that visit showed a potassium of 6.1 (although the message was that K was 6.9), creatinine of 5.3 - on call MD asked him to go to the ED for his potassium. Incidentally he had a WBC of 15,300.   ED evaluation revealed  right leg swelling and drainage from the right groin - was seen by [facility] who drained a lymphocele in the proximal right thigh, and started vanco and zosyn for the cellulitis in the right groin, in that his repair last month included prosthetic material. We were asked to see for management of renal related issues  Impression/Plans  1. CKD5 - no access as plan was to wait until healed from fem pop. That course may now be more prolonged with the groin cellulitis. No overt uremic symptoms - I think twitching was due to gabapentin (says started coincident with that as new med in last 2 weeks - was not reordered on admission and symptoms better). K  controlled with conservative measures and dietary restriction; readdressed with [facility] timing of access - per [facility] extender - they want to wait until this current infection is resolved. Fortunately renal stable.  Vein mapping done this admission.  Given stability of renal function will sign off.  Will reschedule with Dr. Lowell GuitarPowell as outpt (originally scheduled for 4/2).  We will call him.  Dietitian to see about low K diet.  Food list given to wife and pt by me yesterday, Please  make sure pt on Renvela when he leaves the hospital (phos binder started this admit)  2. Hyperkalemia - d/t CKD 5; improved after kayexalate and stable. Low K diet. 3. 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 Continue oral calcitriol; phos up; added binder (Renvela) 5. Hypertension - Not goal. Increase amlodipine to 10 mg/day 6. CAD - by cath 2013; med Rx and risk factor reduction 7. Anemia - Started Darbe 100/week for Hb under 10 8. Neuropathy - do not restart gabapentin; twitching has entirely resolved  Camille Balynthia Devann Cribb, MD [facility] [phone] Pager 08/12/2013, 7:08 AM  Subjective:  No new issues Dressing changes and ATB's  Objective Vital signs in last 24 hours: Filed Vitals:   08/11/13 0500 08/11/13 1410 08/11/13 2214 08/12/13 0343  BP:  165/83 158/79 155/71  Pulse:  95 78 78  Temp:  98.3 F (36.8 C) 98.2 F (36.8 C) 97.9 F (36.6 C)  TempSrc:  Oral Oral Oral  Resp:  17 16 16   Height:      Weight: 95.6 kg (210 lb 12.2 oz)     SpO2:  100% 100% 98%   Weight change:   Intake/Output Summary (Last 24 hours) at 08/12/13 0703 Last data filed at 08/12/13 0343  Gross per 24 hour  Intake   1320 ml  Output   2025 ml  Net   -705 ml   Physical Exam:  Blood pressure 150/74, pulse 83, temperature 98.5 F (36.9 C), temperature source Oral, resp. rate 18, height 5\' 9"  (1.753 m), weight 98.113 kg (216 lb 4.8 oz), SpO2 100.00%. Gen: very nice AAM NAD VS as noted  Neck: no JVD  Chest: clear  Heart: regular, no rub or gallop  Abdomen: soft, no focal abd tenderness  Ext: no LLE edema RLD with long incision, scaling, groin wound dressed; right great toe dry gangrenous changes  Neuro: alert, Ox3, no twitching or asterixus   Recent Labs Lab 08/08/13 0055 08/08/13 0105 08/08/13 0855 08/08/13 1525 08/09/13 0609 08/10/13 0325  08/11/13 0509 08/12/13 0325  NA 132* 132* 133* 136* 138 139 138 139  K 6.1* 5.9* 5.9* 5.2 5.5* 4.8 4.9 4.9  CL 96 101 97 100 100 99 99 100  CO2 18*  --  20 20 22 23 22 23   GLUCOSE 211* 218* 223* 182* 74 75 149* 115*  BUN 77* 94* 83* 76* 74* 69* 60* 54*  CREATININE 5.62* 6.40* 5.61* 5.58* 5.31* 5.13* 4.81* 4.85*  CALCIUM 8.6  --  9.0 8.4 9.2 8.9 8.7 8.9  PHOS  --   --   --   --  6.7* 6.5* 6.0* 5.8*    Recent Labs Lab 08/08/13 0055 08/08/13 0855  08/10/13 0325 08/11/13 0509 08/12/13 0325  AST 11 11  --   --   --   --   ALT 10 9  --   --   --   --   ALKPHOS 63 57  --   --   --   --   BILITOT <0.2* 0.2*  --   --   --   --   PROT 6.8 6.7  --   --   --   --   ALBUMIN 2.3* 2.3*  < > 2.1* 2.0* 2.1*  < > = values in this interval not displayed. Recent Labs Lab 08/08/13 0055  08/08/13 0855 08/09/13 0609 08/10/13 0325 08/11/13 0509  WBC 15.9*  --  14.3* 11.7* 10.8* 10.4  NEUTROABS 12.4*  --   --   --   --   --   HGB 8.8*  < > 9.0* 8.8* 9.1* 8.6*  HCT 26.8*  < > 26.8* 26.5* 27.3* 25.8*  MCV 90.8  --  89.9 89.2 88.9 89.3  PLT 453*  --  385 391 415* 384  < > = values in this interval not displayed.  Recent Labs Lab 08/11/13 0525 08/11/13 1119 08/11/13 1616 08/11/13 2208 08/12/13 0629  GLUCAP 146* 189* 141* 158* 104*   Medications:   . amLODipine  5 mg Oral Daily  . aspirin  81 mg Oral Daily  . calcitRIOL  0.25 mcg Oral Daily  . carvedilol  25 mg Oral BID WC  . cholecalciferol  400 Units Oral Daily  . darbepoetin (ARANESP) injection - NON-DIALYSIS  100 mcg Subcutaneous Q Wed-1800  . enoxaparin (LOVENOX) injection  30 mg Subcutaneous Q24H  . furosemide  40 mg Oral BID  . insulin aspart  0-9 Units Subcutaneous TID WC  . multivitamin  1 tablet Oral QHS  . omega-3 acid ethyl esters  1 g Oral Daily  . pantoprazole  40 mg Oral Daily  . piperacillin-tazobactam (ZOSYN)  IV  2.25 g Intravenous Q8H  . pravastatin  40 mg Oral Daily  . sevelamer carbonate  800 mg Oral TID WC   . sodium bicarbonate  650 mg  Oral TID

## 2013-08-13 LAB — RENAL FUNCTION PANEL
Albumin: 2.3 g/dL — ABNORMAL LOW (ref 3.5–5.2)
BUN: 49 mg/dL — AB (ref 6–23)
CHLORIDE: 97 meq/L (ref 96–112)
CO2: 22 mEq/L (ref 19–32)
CREATININE: 4.89 mg/dL — AB (ref 0.50–1.35)
Calcium: 9.1 mg/dL (ref 8.4–10.5)
GFR calc Af Amer: 15 mL/min — ABNORMAL LOW (ref >90)
GFR, EST NON AFRICAN AMERICAN: 13 mL/min — AB (ref >90)
Glucose, Bld: 116 mg/dL — ABNORMAL HIGH (ref 70–99)
Phosphorus: 5.6 mg/dL — ABNORMAL HIGH (ref 2.3–4.6)
Potassium: 4.7 mEq/L (ref 3.7–5.3)
Sodium: 135 mEq/L — ABNORMAL LOW (ref 137–147)

## 2013-08-13 LAB — WOUND CULTURE

## 2013-08-13 LAB — GLUCOSE, CAPILLARY
GLUCOSE-CAPILLARY: 116 mg/dL — AB (ref 70–99)
GLUCOSE-CAPILLARY: 177 mg/dL — AB (ref 70–99)
Glucose-Capillary: 173 mg/dL — ABNORMAL HIGH (ref 70–99)

## 2013-08-13 NOTE — Progress Notes (Signed)
[facility] [place]  Subjective  - Feels better wants to go home   Objective 163/77 76 98.8 F (37.1 C) (Oral) 16 97%  Intake/Output Summary (Last 24 hours) at 08/13/13 0744 Last data filed at 08/13/13 0400  Gross per 24 hour  Intake    990 ml  Output   1975 ml  Net   -985 ml   Right groin maceration some lymph leak, no erythema, right inner thigh indurated firm with clear serous drainage Right foot stable dry ulcer clean  Assessment/Planning: Right leg lymphocele/edema post fem pop Will continue IV antibiotics for 1 more day Possible d/c home tomorrow Will continue current wound care groin  FIELDS,CHARLES E 08/13/2013 7:44 AM --  Laboratory Lab Results:  Recent Labs  08/11/13 0509  WBC 10.4  HGB 8.6*  HCT 25.8*  PLT 384   BMET  Recent Labs  08/12/13 0325 08/13/13 0534  NA 139 135*  K 4.9 4.7  CL 100 97  CO2 23 22  GLUCOSE 115* 116*  BUN 54* 49*  CREATININE 4.85* 4.89*  CALCIUM 8.9 9.1    COAG Lab Results  Component Value Date   INR 1.19 08/08/2013   INR 1.07 07/17/2013   INR 0.9 01/02/2007   No results found for this basename: PTT

## 2013-08-13 NOTE — Plan of Care (Signed)
Problem: Food- and Nutrition-Related Knowledge Deficit (NB-1.1) Goal: Nutrition education Formal process to instruct or train a patient/client in a skill or to impart knowledge to help patients/clients voluntarily manage or modify food choices and eating behavior to maintain or improve health. Outcome: Completed/Met Date Met:  08/13/13  RD consulted for low potassium diet.  Provided patient with Potassium and Your Renal Diet handout with [facility].  Patient was also interested in DM diet education information.  RD also provided "Carbohydrate Counting for People with Diabetes" and discussed CHO portion control, intake of diet/sugar-free beverages and limitation of sweets.    Lab Results  Component Value Date    HGBA1C 8.3* 07/18/2013    Expect fair compliance.  Body mass index is 31.11 kg/(m^2). Pt meets criteria for Obesity Class I based on current BMI.  Current diet order is Renal/Carbohydrate Modified with 1200 ml fluid restriction, patient is consuming approximately 100% of meals at this time. Labs and medications reviewed. No further nutrition interventions warranted at this time. If additional nutrition issues arise, please re-consult RD.  Arthur Holms, RD, LDN Pager #: [phone] After-Hours Pager #: [phone]

## 2013-08-14 ENCOUNTER — Telehealth: Payer: Self-pay | Admitting: Vascular Surgery

## 2013-08-14 LAB — GLUCOSE, CAPILLARY
GLUCOSE-CAPILLARY: 160 mg/dL — AB (ref 70–99)
Glucose-Capillary: 113 mg/dL — ABNORMAL HIGH (ref 70–99)

## 2013-08-14 MED ORDER — DOXYCYCLINE HYCLATE 50 MG PO CAPS: 50.0000 mg | ORAL_CAPSULE | Freq: Two times a day (BID) | ORAL | Status: DC

## 2013-08-14 MED ORDER — OXYCODONE-ACETAMINOPHEN 10-325 MG PO TABS: 1.0000 | ORAL_TABLET | Freq: Four times a day (QID) | ORAL | Status: DC | PRN

## 2013-08-14 MED ORDER — PRAVASTATIN SODIUM 40 MG PO TABS: 40.0000 mg | ORAL_TABLET | Freq: Every day | ORAL | Status: DC

## 2013-08-14 MED ORDER — LEVOFLOXACIN 500 MG PO TABS: 500.0000 mg | ORAL_TABLET | ORAL | Status: DC

## 2013-08-14 NOTE — Progress Notes (Signed)
[facility] [place]  Subjective  - Feels better wants to go home   Objective 156/80 76 98.9 F (37.2 C) (Oral) 18 98%  Intake/Output Summary (Last 24 hours) at 08/14/13 0757 Last data filed at 08/14/13 0055  Gross per 24 hour  Intake    600 ml  Output   2577 ml  Net  -1977 ml   Dry gangrene first toe stable Right groin macerated but unchanged Large amount of fluid expressed from upper thigh today with resolution of induration and erythema  Assessment/Planning: Slowly improving post fem pop Will d/c home today on doxycycline and levaquin Also needs renewal of cholesterol meds Will arrange follow up in 2 weeks  Naidelin Gugliotta E 08/14/2013 7:57 AM --  Laboratory Lab Results: No results found for this basename: WBC, HGB, HCT, PLT,  in the last 72 hours BMET  Recent Labs  08/12/13 0325 08/13/13 0534  NA 139 135*  K 4.9 4.7  CL 100 97  CO2 23 22  GLUCOSE 115* 116*  BUN 54* 49*  CREATININE 4.85* 4.89*  CALCIUM 8.9 9.1    COAG Lab Results  Component Value Date   INR 1.19 08/08/2013   INR 1.07 07/17/2013   INR 0.9 01/02/2007   No results found for this basename: PTT

## 2013-08-14 NOTE — Discharge Summary (Signed)
[facility] Discharge Summary  [name] [birth year] 43 y.o. male  [id number]  Admission Date: 08/08/2013  Discharge Date: 08/14/13  Physician: Sherren Kernsharles E Fields, MD  Admission Diagnosis: Hyperkalemia [276.7] Renal failure [586] Post op infection [998.59]   HPI:   This is a 43 y.o. male who is s/p right femoral to below knee popliteal bypass with Propaten, common femoral and external iliac endarterectomy, extended profundaplasty on 07/17/13. He was d/c'd on 07/23/13. He was doing well except for some continued pain in the right great toe. 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 He states that he has had significant right lower extremity swelling which is gradually gotten worse. He denies fever or chills. He notices some clear drainage from the right proximal thigh.  He apparently had some labs drawn outside of the hospital and was noted to have hyperkalemia. He was told to come to the emergency department. He was evaluated by the emergency department physician and vascular surgery was consulted because of the swelling in the right leg and drainage from the right groin.  He has a history of chronic kidney disease and is followed by Dr. Lowell GuitarPowell. He was being considered for access for dialysis but during his hospitalization it was decided to wait until after he recovered from his surgery as his kidney function was stable.   Hospital Course:  The patient was admitted to the hospital and vascular surgery was consulted due to his recent surgery and swelling in his RLE that has gradually worsened as well as drainage from his right groin.  He was found to be hyperkalemic and a renal consult was obtained.  He was started on IV ABx.  He was given Kayexalate and an amp of bicarb and labs were redrawn.  This eventually improved as did his BUN/Cr.  A right lower extremity venous duplex was obtained and was negative for DVT and no evidence of  right Baker's cyst.  On hospital day 2, his wound was slightly separated and cultured with the results of MRSA.  It was expressed in great detail to the pt the importance of keeping wound clean and dry.  He expressed understanding.  On hospital day 6, there was a large amount of fluid expressed from the upper thigh with resolution of induration and erythema.  He was discharged home on doxycycline and Levaquin.  I did call the pharmacist, Lauren for the appropriate dosing of his Levaquin given his renal function.  He will go home on Levaquin 500 mg every other day.  No dosing changes are required for doxycycline.  Per renal, the pt is seen by the dietitian and given info for renal diet.  The remainder of the hospital course consisted of increasing mobilization and increasing intake of solids without difficulty.  CBC    Component Value Date/Time   WBC 10.4 08/11/2013 0509   RBC 2.89* 08/11/2013 0509   HGB 8.6* 08/11/2013 0509   HCT 25.8* 08/11/2013 0509   PLT 384 08/11/2013 0509   MCV 89.3 08/11/2013 0509   MCH 29.8 08/11/2013 0509   MCHC 33.3 08/11/2013 0509   RDW 13.0 08/11/2013 0509   LYMPHSABS 2.1 08/08/2013 0055   MONOABS 1.1* 08/08/2013 0055   EOSABS 0.3 08/08/2013 0055   BASOSABS 0.0 08/08/2013 0055    BMET    Component Value Date/Time   NA 135* 08/13/2013 0534   K 4.7 08/13/2013 0534   CL 97 08/13/2013 0534  CO2 22 08/13/2013 0534   GLUCOSE 116* 08/13/2013 0534   BUN 49* 08/13/2013 0534   CREATININE 4.89* 08/13/2013 0534   CALCIUM 9.1 08/13/2013 0534   GFRNONAA 13* 08/13/2013 0534   GFRAA 15* 08/13/2013 0534     Discharge Instructions:   The patient is discharged to home with extensive instructions on wound care and progressive ambulation.  They are instructed not to drive or perform any heavy lifting until returning to see the physician in his office.  Discharge Orders   Future Appointments Provider Department Dept Phone   08/23/2013 8:45 AM Sherren Kerns, MD [facility]  [phone]   09/12/2013 11:00 AM [facility] [phone]   09/28/2013 10:00 AM Antoine Poche, MD [facility] [phone]   Future Orders Complete By Expires   Call MD for:  redness, tenderness, or signs of infection (pain, swelling, bleeding, redness, odor or green/yellow discharge around incision site)  As directed    Call MD for:  severe or increased pain, loss or decreased feeling  in affected limb(s)  As directed    Call MD for:  temperature >100.5  As directed    Discharge wound care:  As directed    Comments:     Wash the groin wound with soap and water daily and pat dry. (No tub bath-only shower)  Then put a dry gauze or washcloth there to keep this area dry daily and as needed.  Do not use Vaseline or neosporin on your incisions.  Only use soap and water on your incisions and then protect and keep dry.   Driving Restrictions  As directed    Comments:     No driving for 2 weeks   Lifting restrictions  As directed    Comments:     No lifting for 4 weeks   Resume previous diet  As directed       Discharge Diagnosis:  Hyperkalemia [276.7] Renal failure [586] Post op infection [998.59]  Secondary Diagnosis: Patient Active Problem List   Diagnosis Date Noted  . Cellulitis 08/08/2013  . Acute on chronic renal failure 07/09/2013  . Noninfectious gastroenteritis and colitis 07/09/2013  . CKD (chronic kidney disease), stage IV 07/09/2013  . PVD (peripheral vascular disease) 06/28/2013  . Ischemic toe 05/31/2013  . Ischemic cardiomyopathy   . Coronary atherosclerosis of native coronary artery 07/12/2012  . CKD (chronic kidney disease) stage 3, GFR 30-59 ml/min   . NSTEMI (non-ST elevated myocardial infarction) 03/28/2012  . Hyperlipidemia 03/28/2012  . Type 2 diabetes mellitus with diabetic chronic kidney disease 03/28/2012  . PAD (peripheral artery disease) 03/28/2012  . Tobacco abuse 03/28/2012  .  Essential hypertension, benign 03/28/2012   Past Medical History  Diagnosis Date  . Essential hypertension, benign   . Mixed hyperlipidemia   . Coronary atherosclerosis of native coronary artery     100% apical LAD and distal OM1, Rx medically - 11/13  . Ischemic cardiomyopathy     LVEF 40-45%  . RBBB   . PAD (peripheral artery disease)     Normal ABIs, 2011; focal left EIA stenosis; mild bilateral distal SFA disease, 2008  . CHF (congestive heart failure)   . Anginal pain   . Type 2 diabetes mellitus   . History of blood transfusion 1988    "arm went thru glass window" (10/12/2012)  . CKD (chronic kidney disease) stage 3, GFR 30-59 ml/min   . Gouty arthritis   .  Depression   . NSTEMI (non-ST elevated myocardial infarction) 02/2012 and 10/2012  . Shortness of breath     when he has fluid he gets sob       Medication List         amLODipine 5 MG tablet  Commonly known as:  NORVASC  Take 5 mg by mouth daily.     aspirin 81 MG chewable tablet  Chew 81 mg by mouth daily.     calcitRIOL 0.25 MCG capsule  Commonly known as:  ROCALTROL  Take 1 capsule (0.25 mcg total) by mouth daily.     carvedilol 12.5 MG tablet  Commonly known as:  COREG  Take 25 mg by mouth 2 (two) times daily with a meal.     cholecalciferol 400 UNITS Tabs tablet  Commonly known as:  VITAMIN D  Take 400 Units by mouth daily.     doxycycline 50 MG capsule  Commonly known as:  VIBRAMYCIN  Take 1 capsule (50 mg total) by mouth 2 (two) times daily.     fish oil-omega-3 fatty acids 1000 MG capsule  Take 1 g by mouth daily.     furosemide 40 MG tablet  Commonly known as:  LASIX  Take 40 mg by mouth 2 (two) times daily.     glipiZIDE 5 MG tablet  Commonly known as:  GLUCOTROL  Take 1 tablet (5 mg total) by mouth daily.     levofloxacin 500 MG tablet  Commonly known as:  LEVAQUIN  Take 1 tablet (500 mg total) by mouth every other day.     nitroGLYCERIN 0.4 MG SL tablet  Commonly known as:   NITROSTAT  Place 1 tablet (0.4 mg total) under the tongue every 5 (five) minutes as needed for chest pain.     ondansetron 4 MG tablet  Commonly known as:  ZOFRAN  Take 4 mg by mouth every 8 (eight) hours as needed for nausea or vomiting.     oxyCODONE-acetaminophen 10-325 MG per tablet  Commonly known as:  PERCOCET  Take 1 tablet by mouth every 6 (six) hours as needed for pain.     pravastatin 40 MG tablet  Commonly known as:  PRAVACHOL  Take 1 tablet (40 mg total) by mouth at bedtime.        Percoet #30 No Refill He is given a refill on his statin medication Rx given for doxycycline and Levaquin.  Disposition: home  Patient's condition: is Good  Follow up: 1. Dr. Darrick Penna in 08/23/13    Doreatha Massed, PA-C [facility] [phone] 08/14/2013  8:13 AM  - For VQI Registry use --- Instructions: Press F2 to tab through selections.  Delete question if not applicable.   Post-op:  Wound infection: Yes  Graft infection: No  Transfusion: No  If yes, n/a units given New Arrhythmia: No Ipsilateral amputation: No, [ ]  Minor, [ ]  BKA, [ ]  AKA Discharge patency: [ ]  Primary, [ ]  Primary assisted, [ ]  Secondary, [ ]  Occluded Patency judged by: [ ]  Dopper only, [ ]  Palpable graft pulse, [ ]  Palpable distal pulse, [ ]  ABI inc. > 0.15, [ ]  Duplex Discharge ABI: R , L  Discharge TBI: R , L  D/C Ambulatory Status: Ambulatory  Complications: MI: No, [ ]  Troponin only, [ ]  EKG or Clinical CHF: No Resp failure:No, [ ]  Pneumonia, [ ]  Ventilator Chg in renal function: Yes-decreased (4.89 on discharge), [ ]  Inc. Cr > 0.5, [ ]  Temp. Dialysis, [ ]   Permanent dialysis Stroke: No, [ ]  Minor, [ ]  Major Return to OR: No  Reason for return to OR: [ ]  Bleeding, [ ]  Infection, [ ]  Thrombosis, [ ]  Revision  Discharge medications: Statin use:  yes ASA use:  yes Plavix use:  no Beta blocker use: yes Coumadin use: no

## 2013-08-14 NOTE — Telephone Encounter (Addendum)
Message copied by Fredrich Birks on Tue Aug 14, 2013 11:11 AM ------      Message from: Sharee Pimple      Created: Tue Aug 14, 2013  8:34 AM      Regarding: Schedule                   ----- Message -----         From: Dara Lords, PA-C         Sent: 08/14/2013   8:28 AM           To: [facility] Charge Pool            Right groin wound infection.  Needs to f/u with Dr. Darrick Penna 08/23/13.             Thanks,      Samantha ------  08/14/13: patient is already scheduled for 04/16 at 845 am, dpm

## 2013-08-20 ENCOUNTER — Telehealth: Payer: Self-pay

## 2013-08-20 NOTE — Telephone Encounter (Signed)
Phone call from pt. to report a small amt. of bloody drainage from the right groin incision.  Denies any pus-like color/consistency to the bloody drainage.  Denies fever/ chills.  Pt. emphasized that the drainage is "a very small amt."  Stated "the lower right leg incision is doing good."   Has v/u appt. On 4/16.  Encouraged to keep area clean / dry, and to report is symptoms worsen.  Verb. Understanding.

## 2013-08-22 ENCOUNTER — Encounter: Payer: Self-pay | Admitting: Vascular Surgery

## 2013-08-23 ENCOUNTER — Ambulatory Visit (INDEPENDENT_AMBULATORY_CARE_PROVIDER_SITE_OTHER): Payer: PRIVATE HEALTH INSURANCE | Admitting: Vascular Surgery

## 2013-08-23 ENCOUNTER — Other Ambulatory Visit: Payer: Self-pay | Admitting: *Deleted

## 2013-08-23 ENCOUNTER — Other Ambulatory Visit: Payer: Self-pay | Admitting: Vascular Surgery

## 2013-08-23 ENCOUNTER — Encounter: Payer: Self-pay | Admitting: Vascular Surgery

## 2013-08-23 VITALS — BP 117/76 | HR 68 | Ht 69.0 in | Wt 197.0 lb

## 2013-08-23 DIAGNOSIS — I739 Peripheral vascular disease, unspecified: Secondary | ICD-10-CM

## 2013-08-23 NOTE — Progress Notes (Signed)
Patient is a 43 year old male who underwent right femoral to below-knee popliteal bypass with propaten as well as femoral endarterectomy and extended profundoplasty on March 10. He returns today for followup. He has gangrenous changes to his right first toe. He reports some drainage from his right groin incision. He has had no fever or chills.  He denies rest pain although he states the first toe still is occasionally painful. He also still has some continued pain in his groin incision. He has had reaccumulation of a mass in his right medial thigh. He is currently on Levaquin and doxycycline.  Physical exam:    Filed Vitals:   08/23/13 0833  BP: 117/76  Pulse: 68  Height: 5\' 9"  (1.753 m)  Weight: 197 lb (89.359 kg)  SpO2: 100%    Right lower extremity: Some maceration of his right groin incision no significant surrounding erythema good granulation tissue forming, 5 cm mass with erythema right midthigh, biphasic to triphasic posterior tibial Doppler flow right foot monophasic peroneal Doppler flow all other leg incisions healing.  the mass in the right medial thigh was incised today with drainage of some bloody and serous fluid. A culture was taken. The wound was packed. Dry gangrene right first toe stable  Assessment: Slowly improving right lower extremity post femoropopliteal bypass with endarterectomy and profundoplasty slowly healing right lower extremity wounds with dry gangrene right first toe.  Plan: Followup with me in 1 weeks to reassess wounds in the right lower extremity. He will eventually need a permanent dialysis access; however, I'm not going to place this until his right lower extremity is completely healed. If he requires dialysis prior to this he will need a Diatek catheter. Continue doxycycline and Levaquin for now. Followup cultures.  Home health wound care normal saline wet to dry twice daily for right thigh incision  Fabienne Bruns, MD [facility]  [place] Office: [phone] Pager: [phone]

## 2013-08-26 LAB — CULTURE, ROUTINE-ABSCESS
GRAM STAIN: NONE SEEN
Gram Stain: NONE SEEN
Organism ID, Bacteria: NO GROWTH

## 2013-08-28 ENCOUNTER — Other Ambulatory Visit: Payer: Self-pay | Admitting: Vascular Surgery

## 2013-08-28 ENCOUNTER — Telehealth: Payer: Self-pay

## 2013-08-28 DIAGNOSIS — T8140XA Infection following a procedure, unspecified, initial encounter: Secondary | ICD-10-CM

## 2013-08-28 NOTE — Telephone Encounter (Signed)
Message copied by Phillips Odor on Tue Aug 28, 2013  2:33 PM ------      Message from: Sherren Kerns      Created: Tue Aug 28, 2013  1:17 PM      Regarding: RE: antibiotic refill?       Refill 2 weeks            Leonette Most      ----- Message -----         From: Erenest Blank, RN         Sent: 08/28/2013   1:07 PM           To: Sherren Kerns, MD      Subject: antibiotic refill?                                       Do you want to refill his Doxycycline?  He has completed the prev. Script and right leg is not completely healed.  He has appt. With you on Thursday.  The pharmacy has sent a request for refill.        ------

## 2013-08-28 NOTE — Telephone Encounter (Signed)
Sent new order to pt's pharmacy for Doxycycline 50 mg po, bid x 2 weeks, no refills, per v.o. Dr. Darrick Penna.  Pt. made aware.

## 2013-08-29 ENCOUNTER — Encounter: Payer: Self-pay | Admitting: Vascular Surgery

## 2013-08-30 ENCOUNTER — Ambulatory Visit (INDEPENDENT_AMBULATORY_CARE_PROVIDER_SITE_OTHER): Payer: PRIVATE HEALTH INSURANCE | Admitting: Vascular Surgery

## 2013-08-30 ENCOUNTER — Encounter: Payer: Self-pay | Admitting: Vascular Surgery

## 2013-08-30 VITALS — BP 119/78 | HR 76 | Temp 97.4°F | Ht 69.0 in | Wt 191.0 lb

## 2013-08-30 DIAGNOSIS — I739 Peripheral vascular disease, unspecified: Secondary | ICD-10-CM

## 2013-08-30 MED ORDER — ONDANSETRON HCL 4 MG PO TABS: 4.0000 mg | ORAL_TABLET | Freq: Every day | ORAL | Status: DC | PRN

## 2013-08-30 NOTE — Progress Notes (Signed)
Patient is a 43 year old male who underwent right femoral to below-knee popliteal bypass with propaten as well as femoral endarterectomy and extended profundoplasty on March 10. He returns today for followup. He has gangrenous changes to his right first toe. He reports some drainage from his right groin incision. He has had no fever or chills. He denies rest pain although he states the first toe still is occasionally painful. He also still has some continued pain in his groin incision. I drained a collection in his right thigh last week. He is currently on Levaquin and doxycycline.  Physical exam:   Filed Vitals:   08/30/13 0849  BP: 119/78  Pulse: 76  Temp: 97.4 F (36.3 C)  TempSrc: Oral  Height: 5\' 9"  (1.753 m)  Weight: 191 lb (86.637 kg)  SpO2: 100%    Right lower extremity: Some maceration of his right groin incision no significant surrounding erythema good granulation tissue forming, some fibrinous exudate debrided today. Granulating wound right midthigh, biphasic to triphasic posterior tibial Doppler flow right foot monophasic peroneal Doppler flow all other leg incisions healing.   Dry gangrene right first toe stable.  Culture from right thigh wound last week was negative  Assessment: Slowly improving right lower extremity post femoropopliteal bypass with endarterectomy and profundoplasty slowly healing right lower extremity wounds with dry gangrene right first toe.  Plan: Followup with me in 2-3 weeks to reassess wounds in the right lower extremity. He will eventually need a permanent dialysis access; however, I'm not going to place this until his right lower extremity is completely healed. If he requires dialysis prior to this he will need a Diatek catheter. Continue doxycycline for now. Stop levaquin.  Pt given a renewal of Zofran Rx today.   Home health wound care normal saline wet to dry twice daily for right thigh and groin incision  Fabienne Bruns, MD [facility] [place] Office: [phone] Pager: [phone]

## 2013-09-04 ENCOUNTER — Telehealth: Payer: Self-pay

## 2013-09-04 NOTE — Telephone Encounter (Signed)
Pt. called to report continued upset stomach and diarrhea while taking Doxycycline.  Reported that the diarrhea is worsening; reported having 2-7 stools/day; having problems now with control.  Is requesting to d/c the Doxycycline.  Will make Dr. Darrick Penna aware of worsening symptoms.

## 2013-09-05 ENCOUNTER — Telehealth: Payer: Self-pay

## 2013-09-05 NOTE — Telephone Encounter (Signed)
Rec'd order from Dr. Darrick Penna to discontinue Doxycycline, and to order stool sample for C-Diff.  Notified pt. of Dr. Darrick Penna recommendations.  Instructed pt. to report to [facility]. Lab to submit a stool sample.  Verb. understanding.  Order for stool for C-Diff. faxed to [facility].

## 2013-09-05 NOTE — Telephone Encounter (Signed)
Phone call from pt.  Reported his mother changed his dressing to the right groin, and at the end of the incision there was a "boil" with a thin bloody fluid draining from it.  Stated "the boil is about the size of a wart".  Also reported his home care nurse put packing in the groin wound, and it is now painful.  Pt. reported that the wound had closed up, and the [facility] RN put packing in it anyway.  Pt. questioned if this was the appropriate care.  C/o chills; unsure if he has a fever.  Doxycycline was d/c'd today due to worsening diarrheal stools.  Pt. submitted a stool sample this afternoon to the [facility] to eval. for C-Diff.  Advised pt. will sched appt. to check wounds tomorrow @ 2:20 PM.  Agrees with plan.

## 2013-09-06 ENCOUNTER — Ambulatory Visit (INDEPENDENT_AMBULATORY_CARE_PROVIDER_SITE_OTHER): Payer: PRIVATE HEALTH INSURANCE | Admitting: Family

## 2013-09-06 ENCOUNTER — Encounter: Payer: Self-pay | Admitting: Family

## 2013-09-06 VITALS — BP 108/66 | HR 68 | Temp 97.8°F | Resp 16 | Ht 69.0 in | Wt 195.0 lb

## 2013-09-06 DIAGNOSIS — L24A9 Irritant contact dermatitis due friction or contact with other specified body fluids: Secondary | ICD-10-CM

## 2013-09-06 DIAGNOSIS — L02224 Furuncle of groin: Secondary | ICD-10-CM | POA: Insufficient documentation

## 2013-09-06 DIAGNOSIS — K921 Melena: Secondary | ICD-10-CM

## 2013-09-06 DIAGNOSIS — L819 Disorder of pigmentation, unspecified: Secondary | ICD-10-CM

## 2013-09-06 DIAGNOSIS — L02239 Carbuncle of trunk, unspecified: Secondary | ICD-10-CM

## 2013-09-06 DIAGNOSIS — T148XXA Other injury of unspecified body region, initial encounter: Secondary | ICD-10-CM | POA: Insufficient documentation

## 2013-09-06 DIAGNOSIS — L02229 Furuncle of trunk, unspecified: Secondary | ICD-10-CM

## 2013-09-06 DIAGNOSIS — M79609 Pain in unspecified limb: Secondary | ICD-10-CM | POA: Insufficient documentation

## 2013-09-06 MED ORDER — OXYCODONE-ACETAMINOPHEN 10-325 MG PO TABS: 1.0000 | ORAL_TABLET | Freq: Four times a day (QID) | ORAL | Status: DC | PRN

## 2013-09-06 NOTE — Progress Notes (Signed)
Established Dialysis Access  History of Present Illness  [name] is a 43 y.o. (1970-08-04) male patient of Dr. Darrick PennaFields who presents with c/o 1) Right groin Incision wound drainage- 4-5 days ( 2) Boil Right groin, one day (3) Right great toe discoloration- 2-3 days (4) Pt saw BLOOD in Stool this morning.  Patient is s/p right femoral to below-knee popliteal bypass with propaten as well as femoral endarterectomy and extended profundoplasty on March 10.  He has gangrenous changes to his right first toe. He reports some drainage from his right groin incision. He has had no fever or chills. He denies rest pain although he states the first toe still is occasionally painful. He also still has some continued pain in his groin incision. Patient saw Dr. Darrick PennaFields a week ago for gangrenous changes to his right first toe, drainage and pain from his right groin incision. He is not on dialysis yet.  He stopped both doxycycline and Levaquin on advice. He stopped his analgesics as his pain was almost gone in his right great toe and right upper leg incisions, but pain has returned.  Past Medical History  Diagnosis Date  . Essential hypertension, benign   . Mixed hyperlipidemia   . Coronary atherosclerosis of native coronary artery     100% apical LAD and distal OM1, Rx medically - 11/13  . Ischemic cardiomyopathy     LVEF 40-45%  . RBBB   . PAD (peripheral artery disease)     Normal ABIs, 2011; focal left EIA stenosis; mild bilateral distal SFA disease, 2008  . CHF (congestive heart failure)   . Anginal pain   . Type 2 diabetes mellitus   . History of blood transfusion 1988    "arm went thru glass window" (10/12/2012)  . CKD (chronic kidney disease) stage 3, GFR 30-59 ml/min   . Gouty arthritis   . Depression   . NSTEMI (non-ST elevated myocardial infarction) 02/2012 and 10/2012  . Shortness of breath     when he has fluid he gets sob   Past Surgical History  Procedure Laterality Date  . Arm  surgery Right [phone]'s    "@ least 3 ORs; took nerves out of my left leg and put them in my arm" (10/12/2012)  . Cardiac catheterization  03/28/12    20% prox and mid LAD, 100% distal LAD, 30% prox OM, 100% distal OM, 40% prox RCA, 40% mid RCA, 25% distal RCA, small PDA w/ 80% diffuse dz, PLB small w/ 50% diffuse stenoses. No focal lesions for PCI. Recommendations for continued medical management and aggressive RF reduction  . Refractive surgery Bilateral ~ 2005  . Eye surgery Left 1990's    "blood vessel ruptured" (10/12/2012)  . Colonoscopy    . Femoral-popliteal bypass graft Right 07/17/2013    Procedure: BYPASS GRAFT FEMORAL-POPLITEAL ARTERY-RIGHT;  Surgeon: Sherren Kernsharles E Fields, MD;  [facility];  Service: Vascular;  Laterality: Right;  . Endarterectomy femoral Right 07/17/2013    Procedure: ENDARTERECTOMY FEMORAL WITH PROFUNDAPLOASTY;  Surgeon: Sherren Kernsharles E Fields, MD;  [facility];  Service: Vascular;  Laterality: Right;  . Patch angioplasty Right 07/17/2013    Procedure: PATCH ANGIOPLASTY OF COMMON FEMORAL AND PROFUNDA USING SEGMENT OF GREATER SAPHENOUS VEIN ;  Surgeon: Sherren Kernsharles E Fields, MD;  [facility];  Service: Vascular;  Laterality: Right;   History   Social History  . Marital Status: Married    Spouse Name: N/A    Number of Children: N/A  .  Years of Education: N/A   Occupational History  . Not on file.   Social History Main Topics  . Smoking status: Former Smoker -- 0.50 packs/day for 25 years    Types: Cigarettes    Quit date: 07/11/2013  . Smokeless tobacco: Never Used  . Alcohol Use: No     Comment: 10/12/2012 "quit drinking > 10 yr ago; was an alcoholic"  . Drug Use: Yes    Special: Cocaine, "Crack" cocaine, Marijuana     Comment: 10/12/2012 "it's been 14-15 years since I've used cocaine"  . Sexual Activity: Yes   Other Topics Concern  . Not on file   Social History Narrative  . No narrative on file   Family History  Problem Relation Age of Onset  . Heart  attack Father 44  . Hypertension Father   . Hypertension Mother   . Cancer Mother   . Lupus Mother    Current Outpatient Prescriptions on File Prior to Visit  Medication Sig Dispense Refill  . amLODipine (NORVASC) 5 MG tablet Take 5 mg by mouth daily.      Marland Kitchen aspirin 81 MG chewable tablet Chew 81 mg by mouth daily.      . calcitRIOL (ROCALTROL) 0.25 MCG capsule Take 1 capsule (0.25 mcg total) by mouth daily.  30 capsule  0  . carvedilol (COREG) 12.5 MG tablet Take 25 mg by mouth 2 (two) times daily with a meal.      . cholecalciferol (VITAMIN D) 400 UNITS TABS tablet Take 400 Units by mouth daily.      Marland Kitchen doxycycline (VIBRAMYCIN) 50 MG capsule TAKE ONE CAPSULE BY MOUTH TWICE A DAY  28 capsule  0  . fish oil-omega-3 fatty acids 1000 MG capsule Take 1 g by mouth daily.      . furosemide (LASIX) 40 MG tablet Take 40 mg by mouth 2 (two) times daily.      . nitroGLYCERIN (NITROSTAT) 0.4 MG SL tablet Place 1 tablet (0.4 mg total) under the tongue every 5 (five) minutes as needed for chest pain.  25 tablet  11  . ondansetron (ZOFRAN) 4 MG tablet Take 4 mg by mouth every 8 (eight) hours as needed for nausea or vomiting.      . ondansetron (ZOFRAN) 4 MG tablet Take 1 tablet (4 mg total) by mouth daily as needed for nausea or vomiting.  30 tablet  1  . pravastatin (PRAVACHOL) 40 MG tablet Take 1 tablet (40 mg total) by mouth at bedtime.  30 tablet  2  . glipiZIDE (GLUCOTROL) 5 MG tablet Take 1 tablet (5 mg total) by mouth daily.  30 tablet  1   No current facility-administered medications on file prior to visit.   Allergies  Allergen Reactions  . Vytorin [Ezetimibe-Simvastatin]     Weakness, muscle aches bad.  . Oxycodone-Acetaminophen Nausea And Vomiting    Vomiting      REVIEW OF SYSTEMS:  See HPI for pertinent positives and negatives.   Physical Examination  PHYSICAL EXAMINATION: General: The patient appears their stated age.   HEENT:  No gross abnormalities Pulmonary: Respirations  are non-labored Abdomen: Soft and non-tender. Musculoskeletal: There are no major deformities.   Neurologic: No focal weakness or paresthesias are detected, Skin: There are no ulcer or rashes noted. Psychiatric: The patient has normal affect. Vascular: Right upper thigh incision with shallow dehissed area, healthy appearing tissue. Right groin with moderate amount fibrous exudate expressed, silver nitrate applied. Right great to with blackened tip,  but pink healthy skin appearing underneath, appearing to form underneath the scabbed material.   Medical Decision Making  RAND ETCHISON is a 43 y.o. male who presents s/p right femoral to below-knee popliteal bypass with propaten as well as femoral endarterectomy and extended profundoplasty on March 10, with increased pain and exudate from surgical site in right extremity.  Dr. Darrick Penna spoke with pt and his mother.  He probed the right groin and thigh wounds with sterile Q-tips, and moderate amount of fibrous exudate was drained. Dr. Darrick Penna then applied silver nitrate to right groin wound. Right groin wound needs damp daily or twice daily wet to dry dressing change, no packing, wife and mother to perform dressing changes as they have been along with Home Health, Home Health no longer needed at this point if family can do dressing changes. Return as scheduled in about 3 weeks to see Dr. Darrick Penna, call before then for problems or concerns, e.g, signs of infection, worsening pain, etc. Reordered oxycodone 10-325, 1 tab every 6 hours prn pain, disp. #30, no refills.  Carma Lair Shelby Peltz, RN, MSN, FNP-C [facility] [place] Office: [phone]  09/06/2013, 2:38 PM  Clinic MD: Darrick Penna

## 2013-09-12 ENCOUNTER — Encounter (INDEPENDENT_AMBULATORY_CARE_PROVIDER_SITE_OTHER): Payer: PRIVATE HEALTH INSURANCE

## 2013-09-12 DIAGNOSIS — R0989 Other specified symptoms and signs involving the circulatory and respiratory systems: Secondary | ICD-10-CM

## 2013-09-12 DIAGNOSIS — I6529 Occlusion and stenosis of unspecified carotid artery: Secondary | ICD-10-CM

## 2013-09-17 ENCOUNTER — Telehealth: Payer: Self-pay

## 2013-09-17 NOTE — Telephone Encounter (Signed)
Phone call from pt.  Reports "small amt. of white seepage with blood mixed in" from right great toe over the weekend.  States today there is no drainage.  Denies any redness or inflammation in tissue of right great toe.  Wife describes site as having dry, peeling skin.  Denies fever/ chills.  Has appt. on Thursday, 5/14, to see Dr. Darrick Penna.  Advised to keep area clean/dry and protected.  Monitor for s/s of infection; ie: increased pain/tenderness, fever/ chills, worsening drainage,and call office w/any concerns.  Verb. Understanding.

## 2013-09-18 ENCOUNTER — Telehealth: Payer: Self-pay | Admitting: *Deleted

## 2013-09-18 NOTE — Telephone Encounter (Signed)
Message copied by Eustace Moore on Tue Sep 18, 2013  4:48 PM ------      Message from: Dina Rich F      Created: Tue Sep 18, 2013  2:09 PM       Carotid US shows no significant blockages.            Dominga Ferry MD ------

## 2013-09-18 NOTE — Telephone Encounter (Signed)
Patient informed. 

## 2013-09-19 ENCOUNTER — Encounter: Payer: Self-pay | Admitting: Vascular Surgery

## 2013-09-20 ENCOUNTER — Ambulatory Visit (INDEPENDENT_AMBULATORY_CARE_PROVIDER_SITE_OTHER): Payer: Self-pay | Admitting: Vascular Surgery

## 2013-09-20 ENCOUNTER — Encounter: Payer: Self-pay | Admitting: Vascular Surgery

## 2013-09-20 VITALS — BP 147/81 | HR 70 | Ht 69.0 in | Wt 191.0 lb

## 2013-09-20 DIAGNOSIS — T148XXA Other injury of unspecified body region, initial encounter: Secondary | ICD-10-CM

## 2013-09-20 DIAGNOSIS — I998 Other disorder of circulatory system: Secondary | ICD-10-CM

## 2013-09-20 DIAGNOSIS — I999 Unspecified disorder of circulatory system: Secondary | ICD-10-CM

## 2013-09-20 DIAGNOSIS — L24A9 Irritant contact dermatitis due friction or contact with other specified body fluids: Secondary | ICD-10-CM

## 2013-09-20 NOTE — Progress Notes (Signed)
Patient is a 43 year old male who underwent right femoral to below-knee popliteal bypass with propaten as well as femoral endarterectomy and extended profundoplasty on March 10. He returns today for followup. He has gangrenous changes to his right first toe. He reports some drainage from his right groin incision. He has had no fever or chills. He denies rest pain although he states the first toe still is occasionally painful. He also still has some continued pain in his groin incision. He is now off of antibiotics. He also has renal insufficiency and needs a permanent hemodialysis access once his leg has healed.  Physical exam:   Filed Vitals:   09/20/13 1016  BP: 147/81  Pulse: 70  Height: 5\' 9"  (1.753 m)  Weight: 191 lb (86.637 kg)  SpO2: 100%       Right lower extremity: Some maceration of his right groin incision no significant surrounding erythema good granulation tissue forming, some fibrinous exudate debrided today. All other leg incisions healing.   Dry gangrene right first toe stable.   Assessment: Slowly improving right lower extremity post femoropopliteal bypass with endarterectomy and profundoplasty slowly healing right lower extremity wounds with dry gangrene right first toe.  Plan: Followup with me in 4 weeks to reassess wounds in the right lower extremity. He will eventually need a permanent dialysis access; however, I'm not going to place this until his right lower extremity is completely healed. If he requires dialysis prior to this he will need a Diatek catheter.  Home health wound care normal saline wet to dry twice daily for right thigh and groin incision.  I reviewed his previous vein mapping today. He would be a candidate for a right basilic vein transposition or potentially a left radiocephalic AV fistula.  Fabienne Bruns, MD [facility] [place] Office: [phone] Pager: [phone]

## 2013-09-24 ENCOUNTER — Telehealth: Payer: Self-pay | Admitting: *Deleted

## 2013-09-24 NOTE — Telephone Encounter (Signed)
Patient called in to inquire about whether or not he can shower. I instructed him on showering, wound cleansing and care of his surgical sites. He voiced understanding.

## 2013-09-28 ENCOUNTER — Encounter: Payer: Self-pay | Admitting: Cardiology

## 2013-09-28 ENCOUNTER — Ambulatory Visit (INDEPENDENT_AMBULATORY_CARE_PROVIDER_SITE_OTHER): Payer: PRIVATE HEALTH INSURANCE | Admitting: Cardiology

## 2013-09-28 VITALS — BP 143/83 | HR 87 | Ht 69.0 in | Wt 188.0 lb

## 2013-09-28 DIAGNOSIS — R0602 Shortness of breath: Secondary | ICD-10-CM

## 2013-09-28 DIAGNOSIS — I10 Essential (primary) hypertension: Secondary | ICD-10-CM

## 2013-09-28 DIAGNOSIS — I2510 Atherosclerotic heart disease of native coronary artery without angina pectoris: Secondary | ICD-10-CM

## 2013-09-28 DIAGNOSIS — E785 Hyperlipidemia, unspecified: Secondary | ICD-10-CM

## 2013-09-28 DIAGNOSIS — I255 Ischemic cardiomyopathy: Secondary | ICD-10-CM

## 2013-09-28 DIAGNOSIS — I2589 Other forms of chronic ischemic heart disease: Secondary | ICD-10-CM

## 2013-09-28 NOTE — Patient Instructions (Signed)
   Your physician has requested that you have an echocardiogram. Echocardiography is a painless test that uses sound waves to create images of your heart. It provides your doctor with information about the size and shape of your heart and how well your heart's chambers and valves are working. This procedure takes approximately one hour. There are no restrictions for this procedure. Office will contact with results via phone or letter.   Continue all current medications. Your physician wants you to follow up in: 6 months.  You will receive a reminder letter in the mail one-two months in advance.  If you don't receive a letter, please call our office to schedule the follow up appointment     

## 2013-09-28 NOTE — Progress Notes (Signed)
Clinical Summary Mr. Ciolli is a 43 y.o.male seen today for follow up of the following medical problems.   1. CAD/ICM  -prior cath 100% apical LAD stenosis, medically managed  - LVEF 40-45% by echo 03/2012  - denies any chest pain. No othopnea, no PND, occas LE edema.  - He previously stopped imdur because of headaches.  - at last visit increased coreg to 25mg  bid, denies any significant side effects  2. HL  - Last panel 02/2013: TC 255 TG 545 HDL 32 LDL not calc  - he stopped pravastatin recently because he said he thought he heard statins and cause strokes, and wanted to discuss before restarting  3. HTN  - does not check at home  - compliant with meds, has not taken yet today   4. PAD - followed by vascular Dr Darrick Penna  - recent right femoropopiliteal bypass in March 2015.   5. CKD  - going to be followed by [facility] in [place]  6. Tobacco  - he has stopped smoking surgery 07/23/13.    Past Medical History  Diagnosis Date  . Essential hypertension, benign   . Mixed hyperlipidemia   . Coronary atherosclerosis of native coronary artery     100% apical LAD and distal OM1, Rx medically - 11/13  . Ischemic cardiomyopathy     LVEF 40-45%  . RBBB   . PAD (peripheral artery disease)     Normal ABIs, 2011; focal left EIA stenosis; mild bilateral distal SFA disease, 2008  . CHF (congestive heart failure)   . Anginal pain   . Type 2 diabetes mellitus   . History of blood transfusion 1988    "arm went thru glass window" (10/12/2012)  . CKD (chronic kidney disease) stage 3, GFR 30-59 ml/min   . Gouty arthritis   . Depression   . NSTEMI (non-ST elevated myocardial infarction) 02/2012 and 10/2012  . Shortness of breath     when he has fluid he gets sob     Allergies  Allergen Reactions  . Vytorin [Ezetimibe-Simvastatin]     Weakness, muscle aches bad.  . Oxycodone-Acetaminophen Nausea And Vomiting    Vomiting       Current Outpatient Prescriptions    Medication Sig Dispense Refill  . amLODipine (NORVASC) 5 MG tablet Take 5 mg by mouth daily.      Marland Kitchen aspirin 81 MG chewable tablet Chew 81 mg by mouth daily.      . calcitRIOL (ROCALTROL) 0.25 MCG capsule Take 1 capsule (0.25 mcg total) by mouth daily.  30 capsule  0  . carvedilol (COREG) 12.5 MG tablet Take 25 mg by mouth 2 (two) times daily with a meal.      . cholecalciferol (VITAMIN D) 400 UNITS TABS tablet Take 400 Units by mouth daily.      Marland Kitchen doxycycline (VIBRAMYCIN) 50 MG capsule TAKE ONE CAPSULE BY MOUTH TWICE A DAY  28 capsule  0  . fish oil-omega-3 fatty acids 1000 MG capsule Take 1 g by mouth daily.      . furosemide (LASIX) 40 MG tablet Take 40 mg by mouth 2 (two) times daily.      Marland Kitchen glipiZIDE (GLUCOTROL) 5 MG tablet Take 1 tablet (5 mg total) by mouth daily.  30 tablet  1  . nitroGLYCERIN (NITROSTAT) 0.4 MG SL tablet Place 1 tablet (0.4 mg total) under the tongue every 5 (five) minutes as needed for chest pain.  25 tablet  11  .  ondansetron (ZOFRAN) 4 MG tablet Take 4 mg by mouth every 8 (eight) hours as needed for nausea or vomiting.      . ondansetron (ZOFRAN) 4 MG tablet Take 1 tablet (4 mg total) by mouth daily as needed for nausea or vomiting.  30 tablet  1  . oxyCODONE-acetaminophen (PERCOCET) 10-325 MG per tablet Take 1 tablet by mouth every 6 (six) hours as needed for pain.  30 tablet  0  . pravastatin (PRAVACHOL) 40 MG tablet Take 1 tablet (40 mg total) by mouth at bedtime.  30 tablet  2  . Probiotic Product (PROBIOTIC DAILY PO) Take by mouth daily.       No current facility-administered medications for this visit.     Past Surgical History  Procedure Laterality Date  . Arm surgery Right [phone]    "@ least 3 ORs; took nerves out of my left leg and put them in my arm" (10/12/2012)  . Cardiac catheterization  03/28/12    20% prox and mid LAD, 100% distal LAD, 30% prox OM, 100% distal OM, 40% prox RCA, 40% mid RCA, 25% distal RCA, small PDA w/ 80% diffuse dz, PLB  small w/ 50% diffuse stenoses. No focal lesions for PCI. Recommendations for continued medical management and aggressive RF reduction  . Refractive surgery Bilateral ~ 2005  . Eye surgery Left 1990's    "blood vessel ruptured" (10/12/2012)  . Colonoscopy    . Femoral-popliteal bypass graft Right 07/17/2013    Procedure: BYPASS GRAFT FEMORAL-POPLITEAL ARTERY-RIGHT;  Surgeon: Sherren Kernsharles E Fields, MD;  [facility];  Service: Vascular;  Laterality: Right;  . Endarterectomy femoral Right 07/17/2013    Procedure: ENDARTERECTOMY FEMORAL WITH PROFUNDAPLOASTY;  Surgeon: Sherren Kernsharles E Fields, MD;  [facility];  Service: Vascular;  Laterality: Right;  . Patch angioplasty Right 07/17/2013    Procedure: PATCH ANGIOPLASTY OF COMMON FEMORAL AND PROFUNDA USING SEGMENT OF GREATER SAPHENOUS VEIN ;  Surgeon: Sherren Kernsharles E Fields, MD;  [facility];  Service: Vascular;  Laterality: Right;     Allergies  Allergen Reactions  . Vytorin [Ezetimibe-Simvastatin]     Weakness, muscle aches bad.  . Oxycodone-Acetaminophen Nausea And Vomiting    Vomiting        Family History  Problem Relation Age of Onset  . Heart attack Father [id number]  . Hypertension Father   . Hypertension Mother   . Cancer Mother   . Lupus Mother      Social History Mr. Rana SnareLowe reports that he quit smoking about 2 months ago. His smoking use included Cigarettes. He has a 12.5 pack-year smoking history. He has never used smokeless tobacco. Mr. Rana SnareLowe reports that he does not drink alcohol.   Review of Systems CONSTITUTIONAL: No weight loss, fever, chills, weakness or fatigue.  HEENT: Eyes: No visual loss, blurred vision, double vision or yellow sclerae.No hearing loss, sneezing, congestion, runny nose or sore throat.  SKIN: No rash or itching.  CARDIOVASCULAR: per HPI RESPIRATORY: No shortness of breath, cough or sputum.  GASTROINTESTINAL: No anorexia, nausea, vomiting or diarrhea. No abdominal pain or blood.  GENITOURINARY: No burning on  urination, no polyuria NEUROLOGICAL: No headache, dizziness, syncope, paralysis, ataxia, numbness or tingling in the extremities. No change in bowel or bladder control.  MUSCULOSKELETAL: No muscle, back pain, joint pain or stiffness.  LYMPHATICS: No enlarged nodes. No history of splenectomy.  PSYCHIATRIC: No history of depression or anxiety.  ENDOCRINOLOGIC: No reports of sweating, cold or heat intolerance. No polyuria or polydipsia.  .Marland Kitchen  Physical Examination p 64 bp 123/74 Wt 171 lbs BMI 31 Gen: resting comfortably, no acute distress HEENT: no scleral icterus, pupils equal round and reactive, no palptable cervical adenopathy,  CV: RRR, no m/r/g, no JVD, no carotid bruits Resp: Clear to auscultation bilaterally GI: abdomen is soft, non-tender, non-distended, normal bowel sounds, no hepatosplenomegaly MSK: extremities are warm, no edema.  Skin: warm, no rash Neuro:  no focal deficits Psych: appropriate affect   Diagnostic Studies Cath 03/2012  Hemodynamic Findings:  Central aortic pressure: 150/100  Left ventricular pressure: 148/17/21  Angiographic Findings:  Left main: No obstructive disease.  Left Anterior Descending Artery: Large vessel that coursed to the apex. The proximal and mid vessel had serial 20% lesions. The distal vessel had 100% occlusion just before the apex. The vessel was 1.75 mm in this location. The diagonal Johnwesley Lederman was small in caliber and patent with mild diffuse disease.  Circumflex Artery: Large caliber vessel with large first obtuse marginal Mehar Sagen. There is a 30% stenosis in the proximal portion of the obtuse marginal Travanti Mcmanus. The distal segment of the OM Satia Winger is 100% occluded.  Right Coronary Artery: Large, dominant vessel with 40% proximal stenosis, long tubular 40% mid stenosis, serial 25% distal stenoses. The PDA is very small caliber and has diffuse 80% stenosis. The Posterolateral Andyn Sales is small in caliber and has diffuse 50% stenosis.  Left Ventricular  Angiogram: Deferred.  Impression:  1. 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 No focal lesions for PCI  2. NSTEMI secondary to above.  Recommendations: Will continue medical management with aggressive risk factor reduction. He will need to stop smoking. We will continue statin, beta blocker. Will continue ASA and will load with Plavix. Will check echo to assess LVEF.  Complications: None. The patient tolerated the procedure well.   03/2012 echo  LVEF 40-45%, mild LVH, multiple WMAs, grade II diastolic dysfunction,   09/2013 Carotid US Bilateral 1-39% ICA stenosis, patent antegrade vertebrals   Assessment and Plan  1. CAD/ICM  - LVEF 40-45% BY echo 03/2012  - recent cath showed diffuse disease as described above, no PCI targets.  - patient appears euvolemic today - He is not on ACE or spironolactone because of his significant renal dysfunction. Imdur causes headaches  -repeat echo to reevalute LVEF, consider hydral/low dose nitrates(isordil) if persistent dysfunction.   2. HL  - counseled on importance of statin therapy for him, especially given his extensive vascular disease - patient to resume statin  3. HTN  - continue current meds  4. PAD  - follow with vascular   5. Tobacco  - he has succesfully stopped          Antoine Poche, M.D., F.A.C.C.

## 2013-10-09 ENCOUNTER — Other Ambulatory Visit: Payer: Self-pay | Admitting: *Deleted

## 2013-10-09 DIAGNOSIS — Z01810 Encounter for preprocedural cardiovascular examination: Secondary | ICD-10-CM

## 2013-10-09 DIAGNOSIS — N185 Chronic kidney disease, stage 5: Secondary | ICD-10-CM

## 2013-10-10 ENCOUNTER — Telehealth: Payer: Self-pay

## 2013-10-10 NOTE — Telephone Encounter (Signed)
Phone call from pt.  C/o increased pain in right great toe with report of inflammation and redness.  Reported that the toenail is loose and about to come off.  Reported clear drainage, and some bleeding from base of toenail.  Stated the pain has increased over past 3-4 days.  Denies fever.  Voiced concern about how to take care of the toe when the toenail falls off. Due to increased pain, and report of inflammation, will schedule appt. 6/4 @ 2:15 pm.  Agrees with plan.

## 2013-10-11 ENCOUNTER — Other Ambulatory Visit: Payer: Self-pay

## 2013-10-11 ENCOUNTER — Ambulatory Visit (INDEPENDENT_AMBULATORY_CARE_PROVIDER_SITE_OTHER): Payer: Self-pay | Admitting: Vascular Surgery

## 2013-10-11 ENCOUNTER — Encounter: Payer: Self-pay | Admitting: Vascular Surgery

## 2013-10-11 VITALS — BP 138/79 | HR 69 | Temp 97.8°F | Ht 69.0 in | Wt 198.6 lb

## 2013-10-11 DIAGNOSIS — M79609 Pain in unspecified limb: Secondary | ICD-10-CM

## 2013-10-11 DIAGNOSIS — I739 Peripheral vascular disease, unspecified: Secondary | ICD-10-CM

## 2013-10-11 NOTE — Progress Notes (Signed)
 [facility]  HPI:43-year-old male who underwent right femoral to below-knee popliteal bypass with propaten as well as femoral endarterectomy and extended profundoplasty on July 17 2013. He returns today for followup. He has gangrenous changes to his right first toe. He reports some drainage from his right groin incision, but it is less than his previous visit. He has had no fever or chills.  He is also here to discuss his dialysis access planning.  He is not currently on dialysis.   Current Outpatient Prescriptions on File Prior to Visit  Medication Sig Dispense Refill  . amLODipine (NORVASC) 5 MG tablet Take 5 mg by mouth daily.      . aspirin 81 MG chewable tablet Chew 81 mg by mouth daily.      . calcitRIOL (ROCALTROL) 0.25 MCG capsule Take 1 capsule (0.25 mcg total) by mouth daily.  30 capsule  0  . carvedilol (COREG) 12.5 MG tablet Take 25 mg by mouth 2 (two) times daily with a meal.      . cholecalciferol (VITAMIN D) 400 UNITS TABS tablet Take 400 Units by mouth daily.      . fish oil-omega-3 fatty acids 1000 MG capsule Take 1 g by mouth daily.      . furosemide (LASIX) 40 MG tablet Take 40 mg by mouth 2 (two) times daily.      . nitroGLYCERIN (NITROSTAT) 0.4 MG SL tablet Place 1 tablet (0.4 mg total) under the tongue every 5 (five) minutes as needed for chest pain.  25 tablet  11  . ondansetron (ZOFRAN) 4 MG tablet Take 4 mg by mouth every 8 (eight) hours as needed for nausea or vomiting.      . ondansetron (ZOFRAN) 4 MG tablet Take 1 tablet (4 mg total) by mouth daily as needed for nausea or vomiting.  30 tablet  1  . pravastatin (PRAVACHOL) 40 MG tablet Take 1 tablet (40 mg total) by mouth at bedtime.  30 tablet  2  . doxycycline (VIBRAMYCIN) 50 MG capsule TAKE ONE CAPSULE BY MOUTH TWICE A DAY  28 capsule  0  . oxyCODONE-acetaminophen (PERCOCET) 10-325 MG per tablet Take 1 tablet by mouth every 6 (six) hours as needed for pain.  30 tablet  0  .  Probiotic Product (PROBIOTIC DAILY PO) Take by mouth daily.       No current facility-administered medications on file prior to visit.   Past Medical History  Diagnosis Date  . Essential hypertension, benign   . Mixed hyperlipidemia   . Coronary atherosclerosis of native coronary artery     100% apical LAD and distal OM1, Rx medically - 11/13  . Ischemic cardiomyopathy     LVEF 40-45%  . RBBB   . PAD (peripheral artery disease)     Normal ABIs, 2011; focal left EIA stenosis; mild bilateral distal SFA disease, 2008  . CHF (congestive heart failure)   . Anginal pain   . Type 2 diabetes mellitus   . History of blood transfusion 1988    "arm went thru glass window" (10/12/2012)  . CKD (chronic kidney disease) stage 3, GFR 30-59 ml/min   . Gouty arthritis   . Depression   . NSTEMI (non-ST elevated myocardial infarction) 02/2012 and 10/2012  . 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 No change. Skin is warm to touch right foot. Right groin min. Drainage. Palpable radial pulses  Assessment/Planning: s/p right femoral to below knee popliteal bypass with Propaten, common femoral and external iliac endarterectomy, extended profundaplasty on 07/17/13.  Continue daily dressing changes to right groin  Dr. Darrick Penna has given him to option of right great toe amputation verses continued watching.  He has also scheduled him for left AV fistula creation in [place] June.   Lars Mage 10/11/2013 2:49 PM --  Still small amount of drainage from right groin sinus tract but overall improved Dry gangrene tip right first toe stable but with some pain  Needs hemodialysis access Plan for left radial vs brachial cephalic AVF next week.  Risk benefit complication procedure details discussed.  Pt wishes to proceed.  Have also offered right first toe amputation for  pain control but pt currently resistant to this.  Fabienne Bruns, MD [facility] Office: [phone] Pager: [phone]

## 2013-10-12 ENCOUNTER — Encounter (HOSPITAL_COMMUNITY): Payer: Self-pay | Admitting: Pharmacist

## 2013-10-15 ENCOUNTER — Encounter (HOSPITAL_COMMUNITY): Payer: Self-pay | Admitting: *Deleted

## 2013-10-15 MED ORDER — DEXTROSE 5 % IV SOLN
1.5000 g | INTRAVENOUS | Status: AC
  Administered 2013-10-16: 1.5 g via INTRAVENOUS
  Filled 2013-10-15: qty 1.5

## 2013-10-16 ENCOUNTER — Ambulatory Visit (HOSPITAL_COMMUNITY)
Admission: RE | Admit: 2013-10-16 | Discharge: 2013-10-16 | Disposition: A | Discharge status: Home / Self Care | Payer: PRIVATE HEALTH INSURANCE | Source: Ambulatory Visit | Attending: Vascular Surgery | Admitting: Vascular Surgery

## 2013-10-16 ENCOUNTER — Encounter (HOSPITAL_COMMUNITY)
Admission: RE | Disposition: A | Discharge status: Home / Self Care | Payer: Self-pay | Source: Ambulatory Visit | Attending: Vascular Surgery

## 2013-10-16 ENCOUNTER — Encounter (HOSPITAL_COMMUNITY): Payer: Self-pay | Admitting: *Deleted

## 2013-10-16 ENCOUNTER — Ambulatory Visit (HOSPITAL_COMMUNITY): Payer: PRIVATE HEALTH INSURANCE | Admitting: Certified Registered Nurse Anesthetist

## 2013-10-16 ENCOUNTER — Encounter (HOSPITAL_COMMUNITY): Payer: PRIVATE HEALTH INSURANCE | Admitting: Certified Registered Nurse Anesthetist

## 2013-10-16 DIAGNOSIS — I509 Heart failure, unspecified: Secondary | ICD-10-CM | POA: Insufficient documentation

## 2013-10-16 DIAGNOSIS — E119 Type 2 diabetes mellitus without complications: Secondary | ICD-10-CM | POA: Insufficient documentation

## 2013-10-16 DIAGNOSIS — M79609 Pain in unspecified limb: Secondary | ICD-10-CM

## 2013-10-16 DIAGNOSIS — I4510 Unspecified right bundle-branch block: Secondary | ICD-10-CM | POA: Insufficient documentation

## 2013-10-16 DIAGNOSIS — F329 Major depressive disorder, single episode, unspecified: Secondary | ICD-10-CM | POA: Insufficient documentation

## 2013-10-16 DIAGNOSIS — Z87891 Personal history of nicotine dependence: Secondary | ICD-10-CM | POA: Insufficient documentation

## 2013-10-16 DIAGNOSIS — F3289 Other specified depressive episodes: Secondary | ICD-10-CM | POA: Insufficient documentation

## 2013-10-16 DIAGNOSIS — N186 End stage renal disease: Secondary | ICD-10-CM | POA: Insufficient documentation

## 2013-10-16 DIAGNOSIS — Z7982 Long term (current) use of aspirin: Secondary | ICD-10-CM | POA: Insufficient documentation

## 2013-10-16 DIAGNOSIS — E785 Hyperlipidemia, unspecified: Secondary | ICD-10-CM | POA: Insufficient documentation

## 2013-10-16 DIAGNOSIS — I2589 Other forms of chronic ischemic heart disease: Secondary | ICD-10-CM | POA: Insufficient documentation

## 2013-10-16 DIAGNOSIS — N185 Chronic kidney disease, stage 5: Secondary | ICD-10-CM

## 2013-10-16 DIAGNOSIS — I739 Peripheral vascular disease, unspecified: Secondary | ICD-10-CM | POA: Insufficient documentation

## 2013-10-16 DIAGNOSIS — I2510 Atherosclerotic heart disease of native coronary artery without angina pectoris: Secondary | ICD-10-CM | POA: Insufficient documentation

## 2013-10-16 DIAGNOSIS — Z79899 Other long term (current) drug therapy: Secondary | ICD-10-CM | POA: Insufficient documentation

## 2013-10-16 DIAGNOSIS — I252 Old myocardial infarction: Secondary | ICD-10-CM | POA: Insufficient documentation

## 2013-10-16 DIAGNOSIS — I120 Hypertensive chronic kidney disease with stage 5 chronic kidney disease or end stage renal disease: Secondary | ICD-10-CM | POA: Insufficient documentation

## 2013-10-16 HISTORY — PX: AV FISTULA PLACEMENT: SHX1204

## 2013-10-16 LAB — POCT I-STAT 4, (NA,K, GLUC, HGB,HCT)
GLUCOSE: 151 mg/dL — AB (ref 70–99)
HCT: 29 % — ABNORMAL LOW (ref 39.0–52.0)
Hemoglobin: 9.9 g/dL — ABNORMAL LOW (ref 13.0–17.0)
Potassium: 3.9 mEq/L (ref 3.7–5.3)
Sodium: 141 mEq/L (ref 137–147)

## 2013-10-16 LAB — GLUCOSE, CAPILLARY: GLUCOSE-CAPILLARY: 154 mg/dL — AB (ref 70–99)

## 2013-10-16 SURGERY — ARTERIOVENOUS (AV) FISTULA CREATION
Anesthesia: Monitor Anesthesia Care | Site: Arm Lower | Laterality: Left

## 2013-10-16 MED ORDER — MIDAZOLAM HCL 5 MG/5ML IJ SOLN
INTRAMUSCULAR | Status: DC | PRN
  Administered 2013-10-16: 2 mg via INTRAVENOUS

## 2013-10-16 MED ORDER — PROPOFOL INFUSION 10 MG/ML OPTIME
INTRAVENOUS | Status: DC | PRN
  Administered 2013-10-16: 100 ug/kg/min via INTRAVENOUS

## 2013-10-16 MED ORDER — FENTANYL CITRATE 0.05 MG/ML IJ SOLN
INTRAMUSCULAR | Status: AC
  Filled 2013-10-16: qty 5

## 2013-10-16 MED ORDER — OXYCODONE-ACETAMINOPHEN 5-325 MG PO TABS
ORAL_TABLET | ORAL | Status: AC
  Filled 2013-10-16: qty 1

## 2013-10-16 MED ORDER — LIDOCAINE HCL (PF) 1 % IJ SOLN
INTRAMUSCULAR | Status: AC
Start: 2013-10-16 — End: 2013-10-16
  Filled 2013-10-16: qty 30

## 2013-10-16 MED ORDER — SODIUM CHLORIDE 0.9 % IV SOLN: INTRAVENOUS | Status: DC

## 2013-10-16 MED ORDER — PROPOFOL 10 MG/ML IV BOLUS
INTRAVENOUS | Status: AC
  Filled 2013-10-16: qty 20

## 2013-10-16 MED ORDER — OXYCODONE-ACETAMINOPHEN 5-325 MG PO TABS
1.0000 | ORAL_TABLET | Freq: Once | ORAL | Status: AC
  Administered 2013-10-16: 1 via ORAL

## 2013-10-16 MED ORDER — HEPARIN SODIUM (PORCINE) 1000 UNIT/ML IJ SOLN
INTRAMUSCULAR | Status: DC | PRN
  Administered 2013-10-16: 7000 [IU] via INTRAVENOUS

## 2013-10-16 MED ORDER — CARVEDILOL 12.5 MG PO TABS
12.5000 mg | ORAL_TABLET | Freq: Once | ORAL | Status: AC
  Administered 2013-10-16: 12.5 mg via ORAL

## 2013-10-16 MED ORDER — LIDOCAINE HCL (PF) 1 % IJ SOLN
INTRAMUSCULAR | Status: AC
  Filled 2013-10-16: qty 30

## 2013-10-16 MED ORDER — THROMBIN 20000 UNITS EX SOLR
CUTANEOUS | Status: AC
  Filled 2013-10-16: qty 20000

## 2013-10-16 MED ORDER — CARVEDILOL 12.5 MG PO TABS: 12.5000 mg | ORAL_TABLET | Freq: Two times a day (BID) | ORAL | Status: DC

## 2013-10-16 MED ORDER — ONDANSETRON HCL 4 MG/2ML IJ SOLN
INTRAMUSCULAR | Status: DC | PRN
  Administered 2013-10-16: 4 mg via INTRAVENOUS

## 2013-10-16 MED ORDER — SODIUM CHLORIDE 0.9 % IV SOLN
INTRAVENOUS | Status: DC | PRN
  Administered 2013-10-16: 07:00:00 via INTRAVENOUS

## 2013-10-16 MED ORDER — LIDOCAINE HCL (PF) 1 % IJ SOLN
INTRAMUSCULAR | Status: DC | PRN
  Administered 2013-10-16: 21.5 mL

## 2013-10-16 MED ORDER — 0.9 % SODIUM CHLORIDE (POUR BTL) OPTIME
TOPICAL | Status: DC | PRN
  Administered 2013-10-16: 1000 mL

## 2013-10-16 MED ORDER — MIDAZOLAM HCL 2 MG/2ML IJ SOLN
INTRAMUSCULAR | Status: AC
  Filled 2013-10-16: qty 2

## 2013-10-16 MED ORDER — OXYCODONE-ACETAMINOPHEN 10-325 MG PO TABS: 1.0000 | ORAL_TABLET | Freq: Four times a day (QID) | ORAL | Status: DC | PRN

## 2013-10-16 MED ORDER — CHLORHEXIDINE GLUCONATE CLOTH 2 % EX PADS: 6.0000 | MEDICATED_PAD | Freq: Once | CUTANEOUS | Status: DC

## 2013-10-16 MED ORDER — CARVEDILOL 12.5 MG PO TABS
ORAL_TABLET | ORAL | Status: AC
  Filled 2013-10-16: qty 1

## 2013-10-16 MED ORDER — FENTANYL CITRATE 0.05 MG/ML IJ SOLN
INTRAMUSCULAR | Status: DC | PRN
  Administered 2013-10-16 (×4): 12.5 ug via INTRAVENOUS
  Administered 2013-10-16: 50 ug via INTRAVENOUS
  Administered 2013-10-16 (×2): 12.5 ug via INTRAVENOUS
  Administered 2013-10-16: 25 ug via INTRAVENOUS
  Administered 2013-10-16 (×2): 12.5 ug via INTRAVENOUS

## 2013-10-16 MED ORDER — HEPARIN SODIUM (PORCINE) 5000 UNIT/ML IJ SOLN
INTRAMUSCULAR | Status: DC | PRN
  Administered 2013-10-16: 08:00:00

## 2013-10-16 SURGICAL SUPPLY — 40 items
ADH SKN CLS APL DERMABOND .7 (GAUZE/BANDAGES/DRESSINGS) ×3
ARMBAND PINK RESTRICT EXTREMIT (MISCELLANEOUS) ×2 IMPLANT
BLADE 10 SAFETY STRL DISP (BLADE) IMPLANT
CANISTER SUCTION 2500CC (MISCELLANEOUS) ×2 IMPLANT
CLIP TI MEDIUM 6 (CLIP) ×2 IMPLANT
CLIP TI WIDE RED SMALL 6 (CLIP) ×2 IMPLANT
COVER PROBE W GEL 5X96 (DRAPES) ×2 IMPLANT
COVER SURGICAL LIGHT HANDLE (MISCELLANEOUS) ×2 IMPLANT
DECANTER SPIKE VIAL GLASS SM (MISCELLANEOUS) ×1 IMPLANT
DERMABOND ADVANCED (GAUZE/BANDAGES/DRESSINGS) ×3
DERMABOND ADVANCED .7 DNX12 (GAUZE/BANDAGES/DRESSINGS) ×1 IMPLANT
DRAIN PENROSE 1/2X12 LTX STRL (WOUND CARE) ×1 IMPLANT
DRAIN PENROSE 1/4X12 LTX STRL (WOUND CARE) ×2 IMPLANT
ELECT REM PT RETURN 9FT ADLT (ELECTROSURGICAL) ×2
ELECTRODE REM PT RTRN 9FT ADLT (ELECTROSURGICAL) ×1 IMPLANT
GEL ULTRASOUND 20GR AQUASONIC (MISCELLANEOUS) IMPLANT
GLOVE BIO SURGEON STRL SZ 6.5 (GLOVE) ×4 IMPLANT
GLOVE BIO SURGEON STRL SZ7.5 (GLOVE) ×2 IMPLANT
GLOVE BIOGEL PI IND STRL 6.5 (GLOVE) IMPLANT
GLOVE BIOGEL PI IND STRL 7.0 (GLOVE) IMPLANT
GLOVE BIOGEL PI INDICATOR 6.5 (GLOVE) ×4
GLOVE BIOGEL PI INDICATOR 7.0 (GLOVE) ×1
GLOVE ECLIPSE 6.5 STRL STRAW (GLOVE) ×2 IMPLANT
GOWN STRL REUS W/ TWL LRG LVL3 (GOWN DISPOSABLE) ×3 IMPLANT
GOWN STRL REUS W/TWL LRG LVL3 (GOWN DISPOSABLE) ×8
KIT BASIN OR (CUSTOM PROCEDURE TRAY) ×2 IMPLANT
KIT ROOM TURNOVER OR (KITS) ×2 IMPLANT
LOOP VESSEL MINI RED (MISCELLANEOUS) IMPLANT
NS IRRIG 1000ML POUR BTL (IV SOLUTION) ×2 IMPLANT
PACK CV ACCESS (CUSTOM PROCEDURE TRAY) ×2 IMPLANT
PAD ARMBOARD 7.5X6 YLW CONV (MISCELLANEOUS) ×4 IMPLANT
SPONGE SURGIFOAM ABS GEL 100 (HEMOSTASIS) IMPLANT
SUT PROLENE 7 0 BV 1 (SUTURE) ×2 IMPLANT
SUT VIC AB 3-0 SH 27 (SUTURE) ×4
SUT VIC AB 3-0 SH 27X BRD (SUTURE) ×2 IMPLANT
SUT VICRYL 4-0 PS2 18IN ABS (SUTURE) ×3 IMPLANT
TOWEL OR 17X24 6PK STRL BLUE (TOWEL DISPOSABLE) ×2 IMPLANT
TOWEL OR 17X26 10 PK STRL BLUE (TOWEL DISPOSABLE) ×2 IMPLANT
UNDERPAD 30X30 INCONTINENT (UNDERPADS AND DIAPERS) ×2 IMPLANT
WATER STERILE IRR 1000ML POUR (IV SOLUTION) ×2 IMPLANT

## 2013-10-16 NOTE — H&P (View-Only) (Signed)
[facility]  HPI:43 year old male who underwent right femoral to below-knee popliteal bypass with propaten as well as femoral endarterectomy and extended profundoplasty on July 17 2013. He returns today for followup. He has gangrenous changes to his right first toe. He reports some drainage from his right groin incision, but it is less than his previous visit. He has had no fever or chills.  He is also here to discuss his dialysis access planning.  He is not currently on dialysis.   Current Outpatient Prescriptions on File Prior to Visit  Medication Sig Dispense Refill  . amLODipine (NORVASC) 5 MG tablet Take 5 mg by mouth daily.      Marland Kitchen. aspirin 81 MG chewable tablet Chew 81 mg by mouth daily.      . calcitRIOL (ROCALTROL) 0.25 MCG capsule Take 1 capsule (0.25 mcg total) by mouth daily.  30 capsule  0  . carvedilol (COREG) 12.5 MG tablet Take 25 mg by mouth 2 (two) times daily with a meal.      . cholecalciferol (VITAMIN D) 400 UNITS TABS tablet Take 400 Units by mouth daily.      . fish oil-omega-3 fatty acids 1000 MG capsule Take 1 g by mouth daily.      . furosemide (LASIX) 40 MG tablet Take 40 mg by mouth 2 (two) times daily.      . nitroGLYCERIN (NITROSTAT) 0.4 MG SL tablet Place 1 tablet (0.4 mg total) under the tongue every 5 (five) minutes as needed for chest pain.  25 tablet  11  . ondansetron (ZOFRAN) 4 MG tablet Take 4 mg by mouth every 8 (eight) hours as needed for nausea or vomiting.      . ondansetron (ZOFRAN) 4 MG tablet Take 1 tablet (4 mg total) by mouth daily as needed for nausea or vomiting.  30 tablet  1  . pravastatin (PRAVACHOL) 40 MG tablet Take 1 tablet (40 mg total) by mouth at bedtime.  30 tablet  2  . doxycycline (VIBRAMYCIN) 50 MG capsule TAKE ONE CAPSULE BY MOUTH TWICE A DAY  28 capsule  0  . oxyCODONE-acetaminophen (PERCOCET) 10-325 MG per tablet Take 1 tablet by mouth every 6 (six) hours as needed for pain.  30 tablet  0  .  Probiotic Product (PROBIOTIC DAILY PO) Take by mouth daily.       No current facility-administered medications on file prior to visit.   Past Medical History  Diagnosis Date  . Essential hypertension, benign   . Mixed hyperlipidemia   . Coronary atherosclerosis of native coronary artery     100% apical LAD and distal OM1, Rx medically - 11/13  . Ischemic cardiomyopathy     LVEF 40-45%  . RBBB   . PAD (peripheral artery disease)     Normal ABIs, 2011; focal left EIA stenosis; mild bilateral distal SFA disease, 2008  . CHF (congestive heart failure)   . Anginal pain   . Type 2 diabetes mellitus   . History of blood transfusion 1988    "arm went thru glass window" (10/12/2012)  . CKD (chronic kidney disease) stage 3, GFR 30-59 ml/min   . Gouty arthritis   . Depression   . NSTEMI (non-ST elevated myocardial infarction) 02/2012 and 10/2012  . Shortness of breath     when he has fluid he gets sob    Objective 138/79 69 97.8 F (36.6 C) (Oral)   100%   Right great toe tip with dry  gangrene and auto nail evulsion. No change. Skin is warm to touch right foot. Right groin min. Drainage. Palpable radial pulses  Assessment/Planning: s/p right femoral to below knee popliteal bypass with Propaten, common femoral and external iliac endarterectomy, extended profundaplasty on 07/17/13.  Continue daily dressing changes to right groin  Dr. Darrick Penna has given him to option of right great toe amputation verses continued watching.  He has also scheduled him for left AV fistula creation in [place] June.   Lars Mage 10/11/2013 2:49 PM --  Still small amount of drainage from right groin sinus tract but overall improved Dry gangrene tip right first toe stable but with some pain  Needs hemodialysis access Plan for left radial vs brachial cephalic AVF next week.  Risk benefit complication procedure details discussed.  Pt wishes to proceed.  Have also offered right first toe amputation for  pain control but pt currently resistant to this.  Fabienne Bruns, MD [facility] Office: [phone] Pager: [phone]

## 2013-10-16 NOTE — Interval H&P Note (Signed)
History and Physical Interval Note:  10/16/2013 7:33 AM  [name]  has presented today for surgery, with the diagnosis of End stage renal disease   The various methods of treatment have been discussed with the patient and family. After consideration of risks, benefits and other options for treatment, the patient has consented to  Procedure(s): ARTERIOVENOUS (AV) FISTULA CREATION (Left) as a surgical intervention .  The patient's history has been reviewed, patient examined, no change in status, stable for surgery.  I have reviewed the patient's chart and labs.  Questions were answered to the patient's satisfaction.     Sherren Kerns

## 2013-10-16 NOTE — Op Note (Signed)
Procedure: #1 exploration of left radial artery and cephalic vein #2 left basilic to brachial artery fistula (first stage basilic vein transposition)  Preoperative Diagnosis: End-stage renal disease  Postoperative Operative diagnosis: Same  Anesthesia: Local with IV sedation  Assistant: Doreatha MassedSamantha Rhyne PA-C  Operative findings: #1 diffusely calcified small radial artery unusable for fistula                                 #2 no usable antecubital veins for fistula                                 #3 2.5-3 mm basilic vein  Operative details: After obtaining informed consent, the patient was taken to the operating room. The patient was placed in supine position on the operating room table. The patient's entire left upper extremity was prepped and draped in usual sterile fashion. Ultrasound was used to identify the left cephalic vein. This seemed of reasonable quality. Local anesthesia was infiltrated it was between the radial artery and cephalic vein. A longitudinal incision was made in this location and carried to the subcutaneous tissues. The cephalic vein was identified and dissected free circumferentially. It was fairly small 2-1/2-3 mm diameter. Next radial artery was dissected free in the medial portion incision. Radial artery was 2 mm in diameter and heavily calcified. I did not think the combination of a small vein with a diseased radial artery would result in a very durable fistula. At this point attention was turned to the antecubital area. Local anesthesia was infiltrated in this area. A transverse incision was made in the antecubital region and carried into the saphenous tissues. Thorough exploration of the antecubital area showed no suitable veins usable for a fistula. Ultrasound was then used to identify the basilic vein on the ulnar side of the arm near the elbow. Local anesthesia was infiltrated over this. An incision was made in this location and carried down through the subcutaneous  tissues down to level the basilic vein. This had several branches. One side branch was ligated and this was used to infiltrate heparin saline to distend the vein. The vein was fairly small but was at least 2.5-3 mm in diameter. It was decided to use this as a brachial basilic fistula. Next the brachial artery was dissected free circumferentially in the previously made antecubital incision. The brachial artery was thickened on palpation but patent. Patient was given 7000 units of intravenous heparin. After appropriate circulation time, the vessel loops were used to control the brachial artery proximally and distally. Longitudinal opening was made in the brachial artery. The distal basilic vein was ligated and divided between silk ties. The vein was swung over to level the artery and sewn end of vein to side of artery using running 7-0 Prolene suture. Just prior to completion of the anastomosis it was forebled backbled and thoroughly flushed. Anastomosis was secured Vesseloops released and there was a palpable thrill in the fistula immediately. Hemostasis was obtained. The subcutaneous tissues of all 3 incisions were reapproximated using running 3-0 Vicryl suture. The skin of all 3 incisions was closed with a 4-0 Vicryl subcuticular stitch. Dermabond was applied to all 3 incisions. The patient tolerated the procedure well and there were no complications. Instrument sponge and needle count was correct at the end of the case. The patient was taken to recovery room in stable  condition.  Fabienne Bruns, MD [facility] Office: [phone] Pager: [phone]

## 2013-10-16 NOTE — Discharge Instructions (Signed)
° ° °  10/16/2013 [name] [id number] 11-24-1970  Surgeon(s): Sherren Kerns, MD  Procedure(s): CREATION OF LEFT ARM BASILIC TO BRACHIAL FISTULA  x Do not stick graft for 12 weeks   What to eat:  For your first meals, you should eat lightly; only small meals initially.  If you do not have nausea, you may eat larger meals.  Avoid spicy, greasy and heavy food.    General Anesthesia, Adult, Care After  Refer to this sheet in the next few weeks. These instructions provide you with information on caring for yourself after your procedure. Your health care provider may also give you more specific instructions. Your treatment has been planned according to current medical practices, but problems sometimes occur. Call your health care provider if you have any problems or questions after your procedure.  WHAT TO EXPECT AFTER THE PROCEDURE  After the procedure, it is typical to experience:  Sleepiness.  Nausea and vomiting. HOME CARE INSTRUCTIONS  For the first 24 hours after general anesthesia:  Have a responsible person with you.  Do not drive a car. If you are alone, do not take public transportation.  Do not drink alcohol.  Do not take medicine that has not been prescribed by your health care provider.  Do not sign important papers or make important decisions.  You may resume a normal diet and activities as directed by your health care provider.  Change bandages (dressings) as directed.  If you have questions or problems that seem related to general anesthesia, call the hospital and ask for the anesthetist or anesthesiologist on call. SEEK MEDICAL CARE IF:  You have nausea and vomiting that continue the day after anesthesia.  You develop a rash. SEEK IMMEDIATE MEDICAL CARE IF:  You have difficulty breathing.  You have chest pain.  You have any allergic problems. Document Released: 08/02/2000 Document Revised: 12/27/2012 Document Reviewed: 11/09/2012  [facility] Patient Information  2014 [place], [place].

## 2013-10-16 NOTE — Transfer of Care (Signed)
Immediate Anesthesia Transfer of Care Note  Patient: [name]  Procedure(s) Performed: Procedure(s): CREATION OF LEFT ARM BASILIC TO BRACHIAL FISTULA (Left)  Patient Location: PACU  Anesthesia Type:MAC  Level of Consciousness: awake, alert  and oriented  Airway & Oxygen Therapy: Patient Spontanous Breathing  Post-op Assessment: Report given to PACU RN  Post vital signs: Reviewed and stable  Complications: No apparent anesthesia complications

## 2013-10-16 NOTE — Progress Notes (Signed)
Pt asking about help to get his meds.  He is out of coreg. Message left for case manger to return call.

## 2013-10-16 NOTE — Care Management (Signed)
Received a message regarding medication assistance.  Currently the patient is in the OR.  I left my information with PACU to call after surgery.    Lourdes Sledge RN Case Mgr [phone]

## 2013-10-16 NOTE — Anesthesia Preprocedure Evaluation (Addendum)
Anesthesia Evaluation  Patient identified by MRN, date of birth, ID band Patient awake    Reviewed: Allergy & Precautions, H&P , NPO status , Patient's Chart, lab work & pertinent test results  Airway Mallampati: I TM Distance: >3 FB Neck ROM: Full    Dental  (+) Missing   Pulmonary shortness of breath and with exertion, former smoker,          Cardiovascular hypertension, + angina + CAD, + Peripheral Vascular Disease and +CHF Rhythm:Regular Rate:Normal     Neuro/Psych    GI/Hepatic negative GI ROS, Neg liver ROS,   Endo/Other  diabetes, Well Controlled, Type 2  Renal/GU Renal InsufficiencyRenal disease     Musculoskeletal   Abdominal (+) + obese,   Peds  Hematology   Anesthesia Other Findings   Reproductive/Obstetrics                          Anesthesia Physical Anesthesia Plan  ASA: IV  Anesthesia Plan: MAC   Post-op Pain Management:    Induction: Intravenous  Airway Management Planned:   Additional Equipment:   Intra-op Plan:   Post-operative Plan:   Informed Consent: I have reviewed the patients History and Physical, chart, labs and discussed the procedure including the risks, benefits and alternatives for the proposed anesthesia with the patient or authorized representative who has indicated his/her understanding and acceptance.   Dental advisory given  Plan Discussed with: CRNA, Anesthesiologist and Surgeon  Anesthesia Plan Comments:        Anesthesia Quick Evaluation

## 2013-10-16 NOTE — Anesthesia Postprocedure Evaluation (Signed)
  Anesthesia Post-op Note  Patient: [name]  Procedure(s) Performed: Procedure(s): CREATION OF LEFT ARM BASILIC TO BRACHIAL FISTULA (Left)  Patient Location: PACU  Anesthesia Type:MAC  Level of Consciousness: awake and alert   Airway and Oxygen Therapy: Patient Spontanous Breathing  Post-op Pain: mild  Post-op Assessment: Post-op Vital signs reviewed, Patient's Cardiovascular Status Stable, Respiratory Function Stable, Patent Airway, No signs of Nausea or vomiting and Pain level controlled  Post-op Vital Signs: Reviewed and stable  Last Vitals:  Filed Vitals:   10/16/13 1208  BP: 127/74  Pulse: 65  Temp:   Resp: 15    Complications: No apparent anesthesia complications

## 2013-10-17 ENCOUNTER — Other Ambulatory Visit: Payer: PRIVATE HEALTH INSURANCE

## 2013-10-17 ENCOUNTER — Telehealth: Payer: Self-pay | Admitting: Vascular Surgery

## 2013-10-17 NOTE — Telephone Encounter (Addendum)
Message copied by Fredrich Birks on Wed Oct 17, 2013 10:33 AM ------      Message from: Sharee Pimple      Created: Tue Oct 16, 2013 10:37 AM      Regarding: Schedule                   ----- Message -----         From: Dara Lords, PA-C         Sent: 10/16/2013  10:17 AM           To: Vvs Charge Pool            S/p left basilic to brachial AVF.  F/u with Dr. Darrick Penna in 2 weeks.  He wants to check his other incisions and toe at that time.  No duplex.             Thanks,      Lelon Mast ------  10/17/13: lm for pt and mailed letter, dpm

## 2013-10-18 ENCOUNTER — Other Ambulatory Visit (HOSPITAL_COMMUNITY): Payer: PRIVATE HEALTH INSURANCE

## 2013-10-18 ENCOUNTER — Encounter (HOSPITAL_COMMUNITY): Payer: Self-pay | Admitting: Vascular Surgery

## 2013-10-18 DIAGNOSIS — Z0279 Encounter for issue of other medical certificate: Secondary | ICD-10-CM

## 2013-10-23 ENCOUNTER — Encounter (HOSPITAL_BASED_OUTPATIENT_CLINIC_OR_DEPARTMENT_OTHER): Payer: PRIVATE HEALTH INSURANCE | Attending: General Surgery

## 2013-10-23 ENCOUNTER — Other Ambulatory Visit (HOSPITAL_BASED_OUTPATIENT_CLINIC_OR_DEPARTMENT_OTHER): Payer: Self-pay | Admitting: General Surgery

## 2013-10-23 DIAGNOSIS — I96 Gangrene, not elsewhere classified: Secondary | ICD-10-CM | POA: Insufficient documentation

## 2013-10-23 DIAGNOSIS — E1159 Type 2 diabetes mellitus with other circulatory complications: Secondary | ICD-10-CM | POA: Insufficient documentation

## 2013-10-23 DIAGNOSIS — M869 Osteomyelitis, unspecified: Secondary | ICD-10-CM

## 2013-10-23 DIAGNOSIS — L97509 Non-pressure chronic ulcer of other part of unspecified foot with unspecified severity: Secondary | ICD-10-CM | POA: Insufficient documentation

## 2013-10-23 DIAGNOSIS — I739 Peripheral vascular disease, unspecified: Secondary | ICD-10-CM | POA: Insufficient documentation

## 2013-10-23 LAB — GLUCOSE, CAPILLARY: GLUCOSE-CAPILLARY: 232 mg/dL — AB (ref 70–99)

## 2013-10-24 NOTE — H&P (Signed)
[name], [name]                  ACCOUNT NO.:  [id number]  MEDICAL RECORD NO.:  [id number]  LOCATION:  FOOT                         FACILITY:  [facility]  PHYSICIAN:  Joanne Gavel, M.D.        DATE OF BIRTH:  1970-06-29  DATE OF ADMISSION:  10/23/2013 DATE OF DISCHARGE:                             HISTORY & PHYSICAL   CHIEF COMPLAINT:  Pain, right great toe.  HISTORY OF PRESENT ILLNESS:  This 43 year old male has a long history of peripheral vascular disease and type 2 diabetes.  He underwent a fem-pop bypass in March of this year.  Results after which reveals still some obstruction of flow.  He has a wound at the tip and the plantar surface of left great toe, which has the appearance of dry gangrene.  The area around the toe is very tender and he has some element of rest pain. Amputation has been offered although not strongly advised, and the patient is desirous of saving the toe.  PAST MEDICAL HISTORY:  Significant for, 1. Hypertension. 2. Hyperlipidemia. 3. Coronary artery disease with ischemic myopathy. 4. Peripheral vascular disease. 5. Congestive heart failure. 6. Angina. 7. Type 2 diabetes. 8. History of MI without elevation of ST. 9. Shortness of breath.  PAST SURGICAL HISTORY: 1. Arm surgery. 2. Eye surgery. 3. Fem-pop bypass in March 2015. 4. AV fistula in June 2015.  CIGARETTES:  He was smoking half pack a day until 3 months ago when he quit.  ALCOHOL:  None for 15 years.  REVIEW OF SYSTEMS:  As above.  PHYSICAL EXAMINATION:  VITAL SIGNS:  Temp 97.9, pulse 75, respirations 18, blood pressure 125/77, glucose is 232. GENERAL APPEARANCE:  Well developed, well nourished, no distress. CHEST:  Clear. HEART:  Regular rhythm. ABDOMEN:  Not examined. EXTREMITIES:  Examination of the great toe reveals an 1.4 x 2.5 area of dry gangrene.  The rest of the toe is slightly tender.  Peripheral pulses are not palpable.  ABI is, however, measured at 1.02.  PLAN OF  TREATMENT:  We will start treating with Santyl.  The toe is too sensitive now for debridement.  We will apply for hyperbaric oxygen on the basis of a Wagner ulcer grade 4.  The patient will be seen in 7 days.  He is going to have chest x-ray, EKG, hemoglobin A1c, sed rate, and cardiac ultrasound and he will be seeing his vascular surgeon in the next 2 weeks.     Joanne Gavel, M.D.     RA/MEDQ  D:  10/23/2013  T:  10/24/2013  Job:  [id number]

## 2013-10-30 ENCOUNTER — Ambulatory Visit (HOSPITAL_COMMUNITY)
Admission: RE | Admit: 2013-10-30 | Discharge: 2013-10-30 | Disposition: A | Discharge status: Home / Self Care | Payer: PRIVATE HEALTH INSURANCE | Source: Ambulatory Visit | Attending: General Surgery | Admitting: General Surgery

## 2013-10-30 DIAGNOSIS — M869 Osteomyelitis, unspecified: Secondary | ICD-10-CM

## 2013-10-30 DIAGNOSIS — E1169 Type 2 diabetes mellitus with other specified complication: Secondary | ICD-10-CM | POA: Insufficient documentation

## 2013-10-30 DIAGNOSIS — L97409 Non-pressure chronic ulcer of unspecified heel and midfoot with unspecified severity: Secondary | ICD-10-CM | POA: Insufficient documentation

## 2013-10-31 ENCOUNTER — Encounter: Payer: Self-pay | Admitting: Vascular Surgery

## 2013-11-01 ENCOUNTER — Ambulatory Visit (INDEPENDENT_AMBULATORY_CARE_PROVIDER_SITE_OTHER): Payer: Self-pay | Admitting: Vascular Surgery

## 2013-11-01 ENCOUNTER — Other Ambulatory Visit (HOSPITAL_COMMUNITY): Payer: PRIVATE HEALTH INSURANCE

## 2013-11-01 ENCOUNTER — Ambulatory Visit: Payer: PRIVATE HEALTH INSURANCE | Admitting: Vascular Surgery

## 2013-11-01 ENCOUNTER — Encounter: Payer: Self-pay | Admitting: Vascular Surgery

## 2013-11-01 VITALS — BP 125/63 | HR 72 | Ht 69.0 in | Wt 195.0 lb

## 2013-11-01 DIAGNOSIS — Z4931 Encounter for adequacy testing for hemodialysis: Secondary | ICD-10-CM

## 2013-11-01 DIAGNOSIS — N186 End stage renal disease: Secondary | ICD-10-CM

## 2013-11-01 DIAGNOSIS — I739 Peripheral vascular disease, unspecified: Secondary | ICD-10-CM

## 2013-11-01 NOTE — Addendum Note (Signed)
Addended by: Sharee Pimple on: 11/01/2013 03:58 PM   Modules accepted: Orders

## 2013-11-01 NOTE — Progress Notes (Signed)
[facility] POST OPERATIVE OFFICE NOTE  CC:  F/u for surgery  HPI:  This is a 43 y.o. male who is s/p exploration of the left radial artery and cephalic vein and left basilic to brachial artery fistula (1st stage basilic vein transposition) on 10/16/13.  He states that he has done quite well with this and has not had any problems.    He states that his right groin wound has healed.  He also states that his right great toe is getting better, but still with some pain.  States he has been approved for hyperbaric therapy.   Very pleased with his care.  Allergies  Allergen Reactions  . Vytorin [Ezetimibe-Simvastatin]     Weakness, muscle aches bad.  . Gabapentin Other (See Comments)    Twitching    Current Outpatient Prescriptions  Medication Sig Dispense Refill  . amLODipine (NORVASC) 5 MG tablet Take 5 mg by mouth daily.      Marland Kitchen aspirin 81 MG chewable tablet Chew 81 mg by mouth daily.      . calcitRIOL (ROCALTROL) 0.25 MCG capsule Take 1 capsule (0.25 mcg total) by mouth daily.  30 capsule  0  . carvedilol (COREG) 12.5 MG tablet Take 12.5 mg by mouth 2 (two) times daily with a meal.       . cholecalciferol (VITAMIN D) 400 UNITS TABS tablet Take 400 Units by mouth daily.      . diphenhydramine-acetaminophen (TYLENOL PM) 25-500 MG TABS Take 1-2 tablets by mouth at bedtime as needed (for pain and sleep).      . fish oil-omega-3 fatty acids 1000 MG capsule Take 1 g by mouth daily.      . furosemide (LASIX) 40 MG tablet Take 40 mg by mouth 2 (two) times daily.      . nitroGLYCERIN (NITROSTAT) 0.4 MG SL tablet Place 1 tablet (0.4 mg total) under the tongue every 5 (five) minutes as needed for chest pain.  25 tablet  11  . ondansetron (ZOFRAN) 4 MG tablet Take 1 tablet (4 mg total) by mouth daily as needed for nausea or vomiting.  30 tablet  1  . oxyCODONE-acetaminophen (PERCOCET) 10-325 MG per tablet Take 1 tablet by mouth every 6 (six) hours as needed for pain.  30 tablet  0  .  pravastatin (PRAVACHOL) 40 MG tablet Take 1 tablet (40 mg total) by mouth at bedtime.  30 tablet  2   No current facility-administered medications for this visit.     ROS:  See HPI  Physical Exam:  Filed Vitals:   11/01/13 1317  BP: 125/63  Pulse: 72    Incision:  Well healed at the left wrist and antecubital space.  +thrill within the fistula.  His right groin is completely healed. Extremities:  Right great toe with gangrene.  Improved overall.   A/P:  This is a 43 y.o. male here for follow up after his left basilic to brachial artery fistula on 10/16/13.  He is doing quite well today.  There is a thrill within the fistula.  His right groin is completely healed and he  No longer needs a bandage for this.  He is still having pain in his right great toe.  He does not want a toe amputation to help with the pain.  He will take extra strength Tylenol as needed for pain.   Doreatha Massed, PA-C [facility] [phone]   Clinic MD:  Pt seen and examined with Dr. Darrick Penna  Patient seen and examined. History exam as above. He underwent first stage basilic transposition as a palpable thrill in the fistula. We'll bring him back in a few weeks to do an ultrasound of the fistula to check for diameter and depth at that point. We will then consider second stage basilic vein transposition. His right leg bypass graft appears to be working well Asst. continues to heal although he does still have some pain on that side. All of his incisions are well healed at this point. He returns for followup he will have a graft duplex exam ABIs and duplex of his AV fistula.  Fabienne Brunsharles Fields, MD [facility] [place] Office: [phone] Pager: [phone]

## 2013-11-07 ENCOUNTER — Other Ambulatory Visit (INDEPENDENT_AMBULATORY_CARE_PROVIDER_SITE_OTHER): Payer: PRIVATE HEALTH INSURANCE

## 2013-11-07 ENCOUNTER — Other Ambulatory Visit: Payer: Self-pay

## 2013-11-07 DIAGNOSIS — R0602 Shortness of breath: Secondary | ICD-10-CM

## 2013-11-07 DIAGNOSIS — I219 Acute myocardial infarction, unspecified: Secondary | ICD-10-CM

## 2013-11-20 ENCOUNTER — Telehealth: Payer: Self-pay | Admitting: *Deleted

## 2013-11-20 NOTE — Telephone Encounter (Signed)
Message copied by Lesle Chris on Tue Nov 20, 2013  1:55 PM ------      Message from: [place] F      Created: Thu Nov 15, 2013  4:18 PM       Echo shows heart function has improved. The pumping function is back to normal            Dominga Ferry MD ------

## 2013-11-20 NOTE — Telephone Encounter (Signed)
Notes Recorded by Lesle Chris, LPN on 6/00/4599 at 1:54 PM Patient notified.

## 2013-11-22 ENCOUNTER — Ambulatory Visit (INDEPENDENT_AMBULATORY_CARE_PROVIDER_SITE_OTHER): Payer: Self-pay | Admitting: Vascular Surgery

## 2013-11-22 ENCOUNTER — Encounter: Payer: Self-pay | Admitting: Vascular Surgery

## 2013-11-22 VITALS — BP 135/65 | HR 72 | Resp 16 | Ht 70.0 in | Wt 209.0 lb

## 2013-11-22 DIAGNOSIS — N186 End stage renal disease: Secondary | ICD-10-CM

## 2013-11-22 MED ORDER — CEPHALEXIN 250 MG PO CAPS: 250.0000 mg | ORAL_CAPSULE | Freq: Three times a day (TID) | ORAL | Status: DC

## 2013-11-22 NOTE — Progress Notes (Signed)
   Patient name: [name] MRN: [id number] DOB: 03-23-1971 Sex: male  REASON FOR VISIT: Redness, warmth, and swelling at left arm fistula site.  HPI: [name] is a 43 y.o. male who had a first stage left basilic vein transposition by Dr. Darrick Penna on 10/16/2013. He was seen by Dr. Darrick Penna on 11/01/2013. At that time the fistula was patent. He had also had a bypass in the right lower extremity and was being followed with her groin wound which had essentially healed by that time. He comes in today as he was concerned about some swelling at the incision. He denies fever.   REVIEW OF SYSTEMS: Arly.Keller ] denotes positive finding; [  ] denotes negative finding  CARDIOVASCULAR:  [ ]  chest pain   [ ]  dyspnea on exertion    CONSTITUTIONAL:  [ ]  fever   [ ]  chills  PHYSICAL EXAM: Filed Vitals:   11/22/13 1329  BP: 135/65  Pulse: 72  Resp: 16  Height: 5\' 10"  (1.778 m)  Weight: 209 lb (94.802 kg)   Body mass index is 29.99 kg/(m^2). GENERAL: The patient is a well-nourished male, in no acute distress. The vital signs are documented above. CARDIOVASCULAR: There is a regular rate and rhythm. PULMONARY: There is good air exchange bilaterally without wheezing or rales. This fistula has a good thrill. There is some mild swelling at the medial aspect of his antecubital incision at the superior aspect of his incision above the antecubital level. There is no drainage or erythema.  MEDICAL ISSUES: I've instructed him to put warm compresses on the incision and the drains to cover with dry gauze. I've also put him on Keflex adjusted for his renal dose for one week. I offered to make a small incision with the 11 blade the antecubital incision medially hour he was opposed to this and I think that this will likely drained spontaneously and appears to be superficial. He'll keep his regularly scheduled appointment with Dr. Darrick Penna in August. He'll call sooner if there are any problems.  Sina Lucchesi S [facility] [place]: [phone]

## 2013-12-04 ENCOUNTER — Telehealth: Payer: Self-pay

## 2013-12-04 NOTE — Telephone Encounter (Signed)
Discussed pt's right LE swelling with Dr. Arbie Cookey.  Advised to have pt. keep f/u with Dr. Darrick Penna next week, and wouldn't schedule a venous duplex of right LE at this time, since the swelling improves with rest/elevation.  Notified pt. Of Dr. Bosie Helper response.  Encouraged pt. to elevate right LE at intervals during day, to reduce the swelling, but to continue to be active.  Verb. Understanding.

## 2013-12-04 NOTE — Telephone Encounter (Signed)
Phone call from pt.  C/o right leg starting to swell again with activity.  Reported the swelling of the right leg had completely resolved, but recently, has recurred.  Reported the swelling occurs from the foot to above the knee. Stated if he lays down with right leg elevated, the swelling goes down.  Denies any redness or warmth.  Also reports that the right great toe is almost healed.  Stated he has been followed by the Podiatrist every 2 weeks, and he is applying Santyl Oint. as directed.  Reported the Podiatrist removes layers of the dead skin at times, and there is only a small black area of discoloration.  Advised will discuss with MD in office re: recurance of swelling, for further recommendation.

## 2013-12-10 ENCOUNTER — Ambulatory Visit (HOSPITAL_COMMUNITY)
Admission: RE | Admit: 2013-12-10 | Discharge: 2013-12-10 | Disposition: A | Discharge status: Home / Self Care | Payer: PRIVATE HEALTH INSURANCE | Source: Ambulatory Visit | Attending: Vascular Surgery | Admitting: Vascular Surgery

## 2013-12-10 ENCOUNTER — Ambulatory Visit (INDEPENDENT_AMBULATORY_CARE_PROVIDER_SITE_OTHER)
Admission: RE | Admit: 2013-12-10 | Discharge: 2013-12-10 | Disposition: A | Discharge status: Home / Self Care | Payer: PRIVATE HEALTH INSURANCE | Source: Ambulatory Visit | Attending: Vascular Surgery | Admitting: Vascular Surgery

## 2013-12-10 DIAGNOSIS — N186 End stage renal disease: Secondary | ICD-10-CM

## 2013-12-10 DIAGNOSIS — Z4931 Encounter for adequacy testing for hemodialysis: Secondary | ICD-10-CM

## 2013-12-10 DIAGNOSIS — I739 Peripheral vascular disease, unspecified: Secondary | ICD-10-CM

## 2013-12-12 ENCOUNTER — Encounter: Payer: Self-pay | Admitting: Vascular Surgery

## 2013-12-13 ENCOUNTER — Ambulatory Visit (INDEPENDENT_AMBULATORY_CARE_PROVIDER_SITE_OTHER): Payer: Self-pay | Admitting: Vascular Surgery

## 2013-12-13 ENCOUNTER — Encounter: Payer: Self-pay | Admitting: Vascular Surgery

## 2013-12-13 VITALS — BP 146/75 | HR 66 | Ht 70.0 in | Wt 209.0 lb

## 2013-12-13 DIAGNOSIS — N186 End stage renal disease: Secondary | ICD-10-CM

## 2013-12-13 DIAGNOSIS — I739 Peripheral vascular disease, unspecified: Secondary | ICD-10-CM

## 2013-12-13 DIAGNOSIS — Z4931 Encounter for adequacy testing for hemodialysis: Secondary | ICD-10-CM

## 2013-12-13 NOTE — Addendum Note (Signed)
Addended by: Sharee Pimple on: 12/13/2013 03:42 PM   Modules accepted: Orders

## 2013-12-13 NOTE — Progress Notes (Signed)
POST OPERATIVE OFFICE NOTE    CC:  F/u for surgery  HPI:  This is a 43 y.o. male who is s/p 1st stage basilic vein transposition 10/16/13 and and right femoral to below knee popliteal bypass with propaten, common femoral and external iliac endarterectomy and extended profundoplasty on 07/17/13.  He states that he is doing well and his right great toe has also gotten better.  He states that he was having some swelling in his right leg.  He states he was taking in salt.  After speaking to Dr. Lowell Guitar, he stopped taking the salt and his leg swelling improved.  He states that he is not on HD and Dr. Lowell Guitar did not give him any indication when he would go on HD.  He see's him again in 3 months.    He saw Dr. Edilia Bo on 11/22/13 for swelling around his left antecubital space.  At that time, he was placed on Keflex.  This has improved.  He states that he has had some bleeding from both incisions.    Allergies  Allergen Reactions  . Vytorin [Ezetimibe-Simvastatin]     Weakness, muscle aches bad.  . Gabapentin Other (See Comments)    Twitching    Current Outpatient Prescriptions  Medication Sig Dispense Refill  . amLODipine (NORVASC) 5 MG tablet Take 5 mg by mouth daily.      Marland Kitchen aspirin 81 MG chewable tablet Chew 81 mg by mouth daily.      . calcitRIOL (ROCALTROL) 0.25 MCG capsule Take 1 capsule (0.25 mcg total) by mouth daily.  30 capsule  0  . carvedilol (COREG) 12.5 MG tablet Take 12.5 mg by mouth 2 (two) times daily with a meal.       . cephALEXin (KEFLEX) 250 MG capsule Take 1 capsule (250 mg total) by mouth 3 (three) times daily.  21 capsule  0  . cholecalciferol (VITAMIN D) 400 UNITS TABS tablet Take 400 Units by mouth daily.      . diphenhydramine-acetaminophen (TYLENOL PM) 25-500 MG TABS Take 1-2 tablets by mouth at bedtime as needed (for pain and sleep).      . fish oil-omega-3 fatty acids 1000 MG capsule Take 1 g by mouth daily.      . furosemide (LASIX) 40 MG tablet Take 40 mg by mouth 2  (two) times daily.      Marland Kitchen l-methylfolate-B6-B12 (METANX) 3-35-2 MG TABS Take 1 tablet by mouth daily.      . nitroGLYCERIN (NITROSTAT) 0.4 MG SL tablet Place 1 tablet (0.4 mg total) under the tongue every 5 (five) minutes as needed for chest pain.  25 tablet  11  . ondansetron (ZOFRAN) 4 MG tablet Take 1 tablet (4 mg total) by mouth daily as needed for nausea or vomiting.  30 tablet  1  . oxyCODONE-acetaminophen (PERCOCET) 10-325 MG per tablet Take 1 tablet by mouth every 6 (six) hours as needed for pain.  30 tablet  0  . pravastatin (PRAVACHOL) 40 MG tablet Take 1 tablet (40 mg total) by mouth at bedtime.  30 tablet  2   No current facility-administered medications for this visit.     ROS:  See HPI  Physical Exam:  Filed Vitals:   12/13/13 1314  BP: 146/75  Pulse: 66    Incision:  There is a small area at the radial incision on the left arm that is ulcerated.  The swelling at the antecubital space has improved.  There is a very small ulceration of  the medial aspect of the antecubital incision. Extremities:  Right foot is warm.  Right great toe continues to heal  Non-invasive studies: Lower extremity arterial duplex 12/10/13: 1.  Patent right femoral to popliteal bypass graft  ABI's 12/10/13: Right:  0.87 Left:  0.98  Dialysis fistula dupex 12/10/13: Diameter ranges from 0.17cm to 0.56cm.   A/P:  This is a 43 y.o. 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  -his bypass is patent and his right great toe is healing.  We will see him back in 3 months for repeat duplex and ABI's of his graft -his fistula is not maturing adequately with a diameter of 0.17 cm in the distal vein.  We will see him in 3 months, which should be adequate time for his ulcerations at his prior incision to heal.  At that time, we will get a duplex of his fistula and if this has not improved, he may need a left forearm graft.  If he needs dialysis before then, he will f/u  with us sooner.   -compression stockings are suggested for the pt's RLE swelling.  He is given pamphlet for [facility] [place].   Doreatha MassedSamantha Wasil Wolke, PA-C [facility] [phone]  Clinic MD:  Pt seen and examined with Dr. Darrick PennaFields  History and exam details as above. Patent bypass with healing toe right leg. Poorly maturing basilic vein first stage fistula but incisions not completely healed at this point. We'll repeat duplex when he returns for his followup graft scan in 3 months. He is currently not on hemodialysis. If the fistula has not developed any at the time of his return appointment we will consider placing a forearm graft if dialysis is imminent. However, would want Dr. Roanna BanningPowell's opinion before placing the graft so that we do not get into a situation where where maintaining graft is not being used.  Fabienne Brunsharles Fields, MD [facility] [place] Office: [phone] Pager: [phone]

## 2014-02-05 DIAGNOSIS — Z0279 Encounter for issue of other medical certificate: Secondary | ICD-10-CM

## 2014-02-11 ENCOUNTER — Telehealth: Payer: Self-pay | Admitting: *Deleted

## 2014-02-11 ENCOUNTER — Other Ambulatory Visit: Payer: Self-pay | Admitting: *Deleted

## 2014-02-11 MED ORDER — LOVASTATIN 20 MG PO TABS: 20.0000 mg | ORAL_TABLET | Freq: Two times a day (BID) | ORAL | Status: DC

## 2014-02-11 NOTE — Telephone Encounter (Signed)
Pt called for Lovastatin refill. Rx was not listed in his med list. After further research pt was to be taking Lovastatin 20mg  twice daily. Rx was originally D/c because he had not been taking it. Dr. Wyline Mood continued med but it was not added back to pts med list at 07/02/13 OV .  Rx sent to pharmacy.

## 2014-02-13 ENCOUNTER — Encounter: Payer: Self-pay | Admitting: Cardiology

## 2014-02-13 ENCOUNTER — Encounter: Payer: Self-pay | Admitting: *Deleted

## 2014-02-13 ENCOUNTER — Ambulatory Visit (INDEPENDENT_AMBULATORY_CARE_PROVIDER_SITE_OTHER): Payer: PRIVATE HEALTH INSURANCE | Admitting: Cardiology

## 2014-02-13 VITALS — BP 125/73 | HR 78 | Ht 69.0 in | Wt 224.5 lb

## 2014-02-13 DIAGNOSIS — E785 Hyperlipidemia, unspecified: Secondary | ICD-10-CM

## 2014-02-13 DIAGNOSIS — R079 Chest pain, unspecified: Secondary | ICD-10-CM

## 2014-02-13 DIAGNOSIS — I2510 Atherosclerotic heart disease of native coronary artery without angina pectoris: Secondary | ICD-10-CM

## 2014-02-13 DIAGNOSIS — I5022 Chronic systolic (congestive) heart failure: Secondary | ICD-10-CM

## 2014-02-13 NOTE — Patient Instructions (Addendum)
There were no changes to your medications. Continue as directed. Your physician has requested that you have a lexiscan myoview. For further information please visit [email or web address] Please follow instruction sheet, as given. Office will contact with results via phone or letter.   Your physician wants you to follow up in:  4 months.  You will receive a reminder letter in the mail one-two months in advance.  If you don't receive a letter, please call our office to schedule the follow up appointment.

## 2014-02-13 NOTE — Progress Notes (Signed)
Clinical Summary [name] is a 42 y.o.male seen today for follow up of the following medical problems.   1. CAD/ICM  -cath 03/2012 severe distal disease too small for intervention, multiple mild to moderate lesions. Managed medically - LVEF 40-45% by echo 03/2012  - reports some chest pain x 3-4 weeks. Tightness left chest, 5/10. Can occur at rest or with exertion. + SOB. Can last a few minutes or up to 3-4 hours. Notes sometimes can get this feeling after drinking fluids, however they also can be exertional - compliant with meds. He is not on ACE/ARB or aldactone due to poor renal function  2. HL  - on lovastatin due to costs, compliant  3. HTN  - compliant with meds  4. PAD  - followed by vascular Dr Darrick Penna  - recent right femoropopiliteal bypass in March 2015.   5. CKD  - going to be followed by [facility] in [place]  - being considered for dialysis  6. Tobacco  - he has stopped smoking  07/23/13.     Past Medical History  Diagnosis Date  . Essential hypertension, benign   . Mixed hyperlipidemia   . Coronary atherosclerosis of native coronary artery     100% apical LAD and distal OM1, Rx medically - 11/13  . Ischemic cardiomyopathy     LVEF 40-45%  . RBBB   . PAD (peripheral artery disease)     Normal ABIs, 2011; focal left EIA stenosis; mild bilateral distal SFA disease, 2008  . CHF (congestive heart failure)   . Anginal pain   . Type 2 diabetes mellitus   . History of blood transfusion 1988    "arm went thru glass window" (10/12/2012)  . CKD (chronic kidney disease) stage 3, GFR 30-59 ml/min   . Gouty arthritis   . Depression   . NSTEMI (non-ST elevated myocardial infarction) 02/2012 and 10/2012  . Shortness of breath     when he has fluid he gets sob     Allergies  Allergen Reactions  . Vytorin [Ezetimibe-Simvastatin]     Weakness, muscle aches bad.  . Gabapentin Other (See Comments)    Twitching     Current Outpatient Prescriptions    Medication Sig Dispense Refill  . amLODipine (NORVASC) 5 MG tablet Take 5 mg by mouth daily.      Marland Kitchen aspirin 81 MG chewable tablet Chew 81 mg by mouth daily.      . calcitRIOL (ROCALTROL) 0.25 MCG capsule Take 1 capsule (0.25 mcg total) by mouth daily.  30 capsule  0  . carvedilol (COREG) 12.5 MG tablet Take 12.5 mg by mouth 2 (two) times daily with a meal.       . cephALEXin (KEFLEX) 250 MG capsule Take 1 capsule (250 mg total) by mouth 3 (three) times daily.  21 capsule  0  . cholecalciferol (VITAMIN D) 400 UNITS TABS tablet Take 400 Units by mouth daily.      . diphenhydramine-acetaminophen (TYLENOL PM) 25-500 MG TABS Take 1-2 tablets by mouth at bedtime as needed (for pain and sleep).      . fish oil-omega-3 fatty acids 1000 MG capsule Take 1 g by mouth daily.      . furosemide (LASIX) 40 MG tablet Take 40 mg by mouth 2 (two) times daily.      Marland Kitchen l-methylfolate-B6-B12 (METANX) 3-35-2 MG TABS Take 1 tablet by mouth daily.      Marland Kitchen lovastatin (MEVACOR) 20 MG tablet Take 1 tablet (  20 mg total) by mouth 2 (two) times daily.  60 tablet  6  . nitroGLYCERIN (NITROSTAT) 0.4 MG SL tablet Place 1 tablet (0.4 mg total) under the tongue every 5 (five) minutes as needed for chest pain.  25 tablet  11  . ondansetron (ZOFRAN) 4 MG tablet Take 1 tablet (4 mg total) by mouth daily as needed for nausea or vomiting.  30 tablet  1  . oxyCODONE-acetaminophen (PERCOCET) 10-325 MG per tablet Take 1 tablet by mouth every 6 (six) hours as needed for pain.  30 tablet  0  . pravastatin (PRAVACHOL) 40 MG tablet Take 1 tablet (40 mg total) by mouth at bedtime.  30 tablet  2   No current facility-administered medications for this visit.     Past Surgical History  Procedure Laterality Date  . Arm surgery Right [phone]    "@ least 3 ORs; took nerves out of my left leg and put them in my arm" (10/12/2012)  . Cardiac catheterization  03/28/12    20% prox and mid LAD, 100% distal LAD, 30% prox OM, 100% distal OM, 40%  prox RCA, 40% mid RCA, 25% distal RCA, small PDA w/ 80% diffuse dz, PLB small w/ 50% diffuse stenoses. No focal lesions for PCI. Recommendations for continued medical management and aggressive RF reduction  . Refractive surgery Bilateral ~ 2005  . Eye surgery Left 1990's    "blood vessel ruptured" (10/12/2012)  . Colonoscopy    . Femoral-popliteal bypass graft Right 07/17/2013    Procedure: BYPASS GRAFT FEMORAL-POPLITEAL ARTERY-RIGHT;  Surgeon: Sherren Kerns, MD;  Location: [facility] OR;  Service: Vascular;  Laterality: Right;  . Endarterectomy femoral Right 07/17/2013    Procedure: ENDARTERECTOMY FEMORAL WITH PROFUNDAPLOASTY;  Surgeon: Sherren Kerns, MD;  Location: [facility] OR;  Service: Vascular;  Laterality: Right;  . Patch angioplasty Right 07/17/2013    Procedure: PATCH ANGIOPLASTY OF COMMON FEMORAL AND PROFUNDA USING SEGMENT OF GREATER SAPHENOUS VEIN ;  Surgeon: Sherren Kerns, MD;  Location: [facility] OR;  Service: Vascular;  Laterality: Right;  . Av fistula placement Left 10/16/2013    Procedure: CREATION OF LEFT ARM BASILIC TO BRACHIAL FISTULA;  Surgeon: Sherren Kerns, MD;  Location: [facility] OR;  Service: Vascular;  Laterality: Left;     Allergies  Allergen Reactions  . Vytorin [Ezetimibe-Simvastatin]     Weakness, muscle aches bad.  . Gabapentin Other (See Comments)    Twitching      Family History  Problem Relation Age of Onset  . Heart attack Father 55  . Hypertension Father   . Hypertension Mother   . Cancer Mother   . Lupus Mother      Social History [name] reports that he quit smoking about 7 months ago. His smoking use included Cigarettes. He has a 12.5 pack-year smoking history. He has never used smokeless tobacco. [name] reports that he does not drink alcohol.   Review of Systems CONSTITUTIONAL: No weight loss, fever, chills, weakness or fatigue.  HEENT: Eyes: No visual loss, blurred vision, double vision or yellow sclerae.No hearing loss, sneezing, congestion, runny nose  or sore throat.  SKIN: No rash or itching.  CARDIOVASCULAR: per HPI RESPIRATORY: No shortness of breath, cough or sputum.  GASTROINTESTINAL: No anorexia, nausea, vomiting or diarrhea. No abdominal pain or blood.  GENITOURINARY: No burning on urination, no polyuria NEUROLOGICAL: No headache, dizziness, syncope, paralysis, ataxia, numbness or tingling in the extremities. No change in bowel or bladder control.  MUSCULOSKELETAL: No muscle,  back pain, joint pain or stiffness.  LYMPHATICS: No enlarged nodes. No history of splenectomy.  PSYCHIATRIC: No history of depression or anxiety.  ENDOCRINOLOGIC: No reports of sweating, cold or heat intolerance. No polyuria or polydipsia.  Marland Kitchen.   Physical Examination p 78 bp 125/73 Wt 224 lbs BMI 33 Gen: resting comfortably, no acute distress HEENT: no scleral icterus, pupils equal round and reactive, no palptable cervical adenopathy,  CV: RRR, no m/r/g, no JVD, no carotid bruits Resp: Clear to auscultation bilaterally GI: abdomen is soft, non-tender, non-distended, normal bowel sounds, no hepatosplenomegaly MSK: extremities are warm, no edema.  Skin: warm, no rash Neuro:  no focal deficits Psych: appropriate affect   Diagnostic Studies Cath 03/2012  Hemodynamic Findings:  Central aortic pressure: 150/100  Left ventricular pressure: 148/17/21  Angiographic Findings:  Left main: No obstructive disease.  Left Anterior Descending Artery: Large vessel that coursed to the apex. The proximal and mid vessel had serial 20% lesions. The distal vessel had 100% occlusion just before the apex. The vessel was 1.75 mm in this location. The diagonal Esmerelda Finnigan was small in caliber and patent with mild diffuse disease.  Circumflex Artery: Large caliber vessel with large first obtuse marginal Selvin Yun. There is a 30% stenosis in the proximal portion of the obtuse marginal Konner Saiz. The distal segment of the OM Shameer Molstad is 100% occluded.  Right Coronary Artery: Large, dominant  vessel with 40% proximal stenosis, long tubular 40% mid stenosis, serial 25% distal stenoses. The PDA is very small caliber and has diffuse 80% stenosis. The Posterolateral Nicholus Chandran is small in caliber and has diffuse 50% stenosis.  Left Ventricular Angiogram: Deferred.  Impression:  1. Triple vessel CAD with occlusion of the apical LAD and distal portion of the first obtuse marginal Lyndi Holbein. No focal lesions for PCI  2. NSTEMI secondary to above.  Recommendations: Will continue medical management with aggressive risk factor reduction. He will need to stop smoking. We will continue statin, beta blocker. Will continue ASA and will load with Plavix. Will check echo to assess LVEF.  Complications: None. The patient tolerated the procedure well.   03/2012 echo  LVEF 40-45%, mild LVH, multiple WMAs, grade II diastolic dysfunction,   09/2013 Carotid US  Bilateral 1-39% ICA stenosis, patent antegrade vertebrals     Assessment and Plan  1. CAD/ICM/chronic systolic HF - LVEF 40-45% BY echo 03/2012  - recent cath showed diffuse disease as described above - describes new onset chest pain, fairly mixed in description as potential cardiac chest pain. Given known disease will obtain Lexiscan MPI to further risk stratify - patient appears euvolemic today  - He is not on ACE or spironolactone because of his significant renal dysfunction. Imdur causes headaches    2. HL  - continue statin, he is awaiting medicaid. Once active he will need a more potent statin.   3. HTN  - continue current meds   4. PAD  - follow with vascular   5. Tobacco  - he has succesfully stopped   6. CKD - continue to follow with nephrology, appears he is borderline needing HD.    F/u 4 months       Antoine PocheJonathan F. Nialah Saravia, M.D.

## 2014-02-14 ENCOUNTER — Ambulatory Visit: Payer: PRIVATE HEALTH INSURANCE | Admitting: Cardiology

## 2014-02-20 ENCOUNTER — Ambulatory Visit (HOSPITAL_COMMUNITY): Admission: RE | Admit: 2014-02-20 | Payer: 59 | Source: Ambulatory Visit

## 2014-02-20 ENCOUNTER — Encounter (HOSPITAL_COMMUNITY): Admission: RE | Admit: 2014-02-20 | Payer: 59 | Source: Ambulatory Visit

## 2014-02-20 ENCOUNTER — Encounter (HOSPITAL_COMMUNITY)
Admission: RE | Admit: 2014-02-20 | Discharge: 2014-02-20 | Disposition: A | Discharge status: Home / Self Care | Payer: 59 | Source: Ambulatory Visit | Attending: Cardiology | Admitting: Cardiology

## 2014-02-20 DIAGNOSIS — R079 Chest pain, unspecified: Secondary | ICD-10-CM | POA: Insufficient documentation

## 2014-02-22 ENCOUNTER — Encounter (HOSPITAL_COMMUNITY)
Admission: RE | Admit: 2014-02-22 | Discharge: 2014-02-22 | Disposition: A | Discharge status: Home / Self Care | Payer: 59 | Source: Ambulatory Visit | Attending: Cardiology | Admitting: Cardiology

## 2014-02-22 ENCOUNTER — Encounter (HOSPITAL_COMMUNITY): Payer: Self-pay

## 2014-02-22 ENCOUNTER — Ambulatory Visit (HOSPITAL_COMMUNITY)
Admission: RE | Admit: 2014-02-22 | Discharge: 2014-02-22 | Disposition: A | Discharge status: Home / Self Care | Payer: 59 | Source: Ambulatory Visit | Attending: Cardiology | Admitting: Cardiology

## 2014-02-22 ENCOUNTER — Encounter (HOSPITAL_COMMUNITY)
Admission: RE | Admit: 2014-02-22 | Discharge: 2014-02-22 | Disposition: A | Discharge status: Home / Self Care | Payer: 59 | Source: Ambulatory Visit | Attending: Cardiovascular Disease | Admitting: Cardiovascular Disease

## 2014-02-22 DIAGNOSIS — I2119 ST elevation (STEMI) myocardial infarction involving other coronary artery of inferior wall: Secondary | ICD-10-CM | POA: Diagnosis not present

## 2014-02-22 DIAGNOSIS — I5189 Other ill-defined heart diseases: Secondary | ICD-10-CM | POA: Insufficient documentation

## 2014-02-22 DIAGNOSIS — I2510 Atherosclerotic heart disease of native coronary artery without angina pectoris: Secondary | ICD-10-CM | POA: Insufficient documentation

## 2014-02-22 DIAGNOSIS — I255 Ischemic cardiomyopathy: Secondary | ICD-10-CM | POA: Insufficient documentation

## 2014-02-22 DIAGNOSIS — R079 Chest pain, unspecified: Secondary | ICD-10-CM | POA: Insufficient documentation

## 2014-02-22 MED ORDER — SODIUM CHLORIDE 0.9 % IJ SOLN
INTRAMUSCULAR | Status: AC
  Administered 2014-02-22: 10 mL via INTRAVENOUS
  Filled 2014-02-22: qty 10

## 2014-02-22 MED ORDER — SODIUM CHLORIDE 0.9 % IJ SOLN
INTRAMUSCULAR | Status: AC
  Filled 2014-02-22: qty 30

## 2014-02-22 MED ORDER — TECHNETIUM TC 99M SESTAMIBI - CARDIOLITE
30.0000 | Freq: Once | INTRAVENOUS | Status: AC | PRN
  Administered 2014-02-22: 30 via INTRAVENOUS

## 2014-02-22 MED ORDER — REGADENOSON 0.4 MG/5ML IV SOLN
0.4000 mg | Freq: Once | INTRAVENOUS | Status: AC | PRN
Start: 2014-02-22 — End: 2014-02-22
  Administered 2014-02-22: 0.4 mg via INTRAVENOUS

## 2014-02-22 MED ORDER — SODIUM CHLORIDE 0.9 % IJ SOLN
10.0000 mL | INTRAMUSCULAR | Status: DC | PRN
  Administered 2014-02-22: 10 mL via INTRAVENOUS

## 2014-02-22 MED ORDER — TECHNETIUM TC 99M SESTAMIBI GENERIC - CARDIOLITE
10.0000 | Freq: Once | INTRAVENOUS | Status: AC | PRN
  Administered 2014-02-22: 10 via INTRAVENOUS

## 2014-02-22 MED ORDER — REGADENOSON 0.4 MG/5ML IV SOLN
INTRAVENOUS | Status: AC
  Administered 2014-02-22: 0.4 mg via INTRAVENOUS
  Filled 2014-02-22: qty 5

## 2014-02-22 NOTE — Progress Notes (Signed)
Stress Lab Nurses Notes - [name]  [name] 02/22/2014 Reason for doing test: CAD and Chest Pain Type of test: [name] Nurse performing test: Parke Poisson, RN Nuclear Medicine Tech: Marcella Dubs Echo Tech: Not Applicable MD performing test: Margaretmary Lombard MD: [name] Test explained and consent signed: Yes.   IV started: Saline lock flushed, No redness or edema and Saline lock started in radiology Symptoms: nausea, SOB, stomache discomfort & chest discomfort Treatment/Intervention: None Reason test stopped: protocol completed After recovery IV was: Discontinued via X-ray tech and No redness or edema Patient to return to Nuc. Med at : 11:15 Patient discharged: Home Patient's Condition upon discharge was: stable Comments: During test BP 140/70 & HR 82. Recovery BP 141/73 & HR 77.  Symptoms resolved in recovery. [name] T

## 2014-02-25 ENCOUNTER — Encounter (HOSPITAL_COMMUNITY): Payer: 59

## 2014-02-25 ENCOUNTER — Telehealth: Payer: Self-pay | Admitting: *Deleted

## 2014-02-25 ENCOUNTER — Ambulatory Visit (HOSPITAL_COMMUNITY): Payer: 59

## 2014-02-25 NOTE — Telephone Encounter (Signed)
Left message for pt to return call.

## 2014-02-25 NOTE — Telephone Encounter (Signed)
Message copied by Jerrye Beavers on Mon Feb 25, 2014 11:44 AM ------      Message from: Murdock F      Created: Mon Feb 25, 2014 11:37 AM       Stress test shows no evidence of new blockages, only evidence of old blockages. Would like to see back in 1 month to see how symptoms are doing            Dominga Ferry MD ------

## 2014-03-05 NOTE — Telephone Encounter (Signed)
Notes Recorded by Lesle Chris, LPN on 49/67/5916 at 10:25 AM Patient notified. Stated that he feels that his breathing is labored. Does seem to notice this more with activity. Stated that he did feel some SOB at last OV with you, but was not sure about telling MD. Was not sure if he should have PMD address issue or not. Scheduled follow up for 03/27/2014 at 3:20.

## 2014-03-12 IMAGING — CR DG FOOT COMPLETE 3+V*R*
3 series · 3 of 3 positions shown · non-contrast
Comparison: None.

CLINICAL DATA: pain

EXAM:
RIGHT FOOT COMPLETE - 3+ VIEW

[x foot ap right]
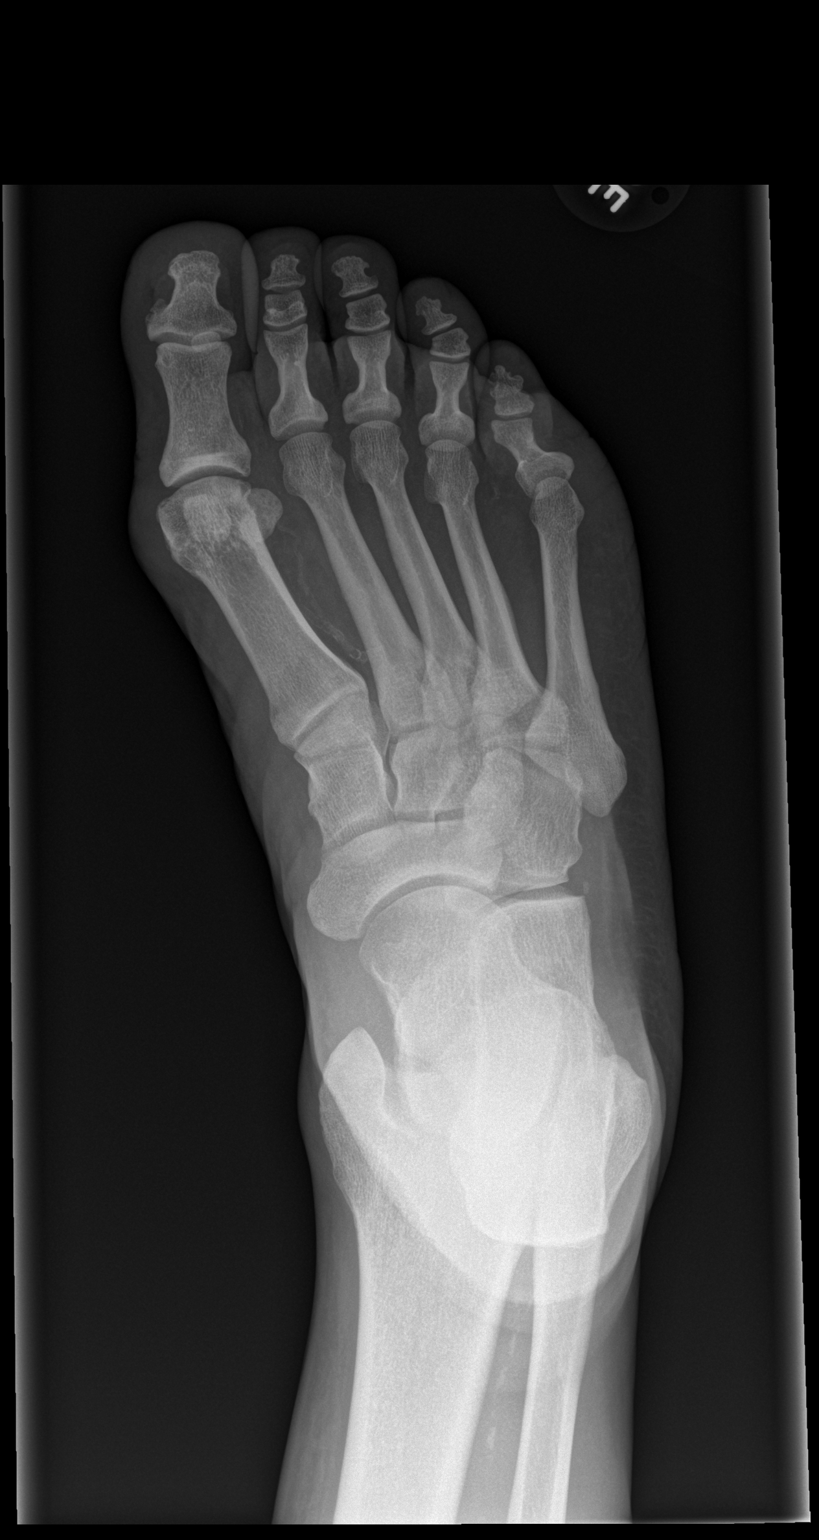

[x foot obl right]
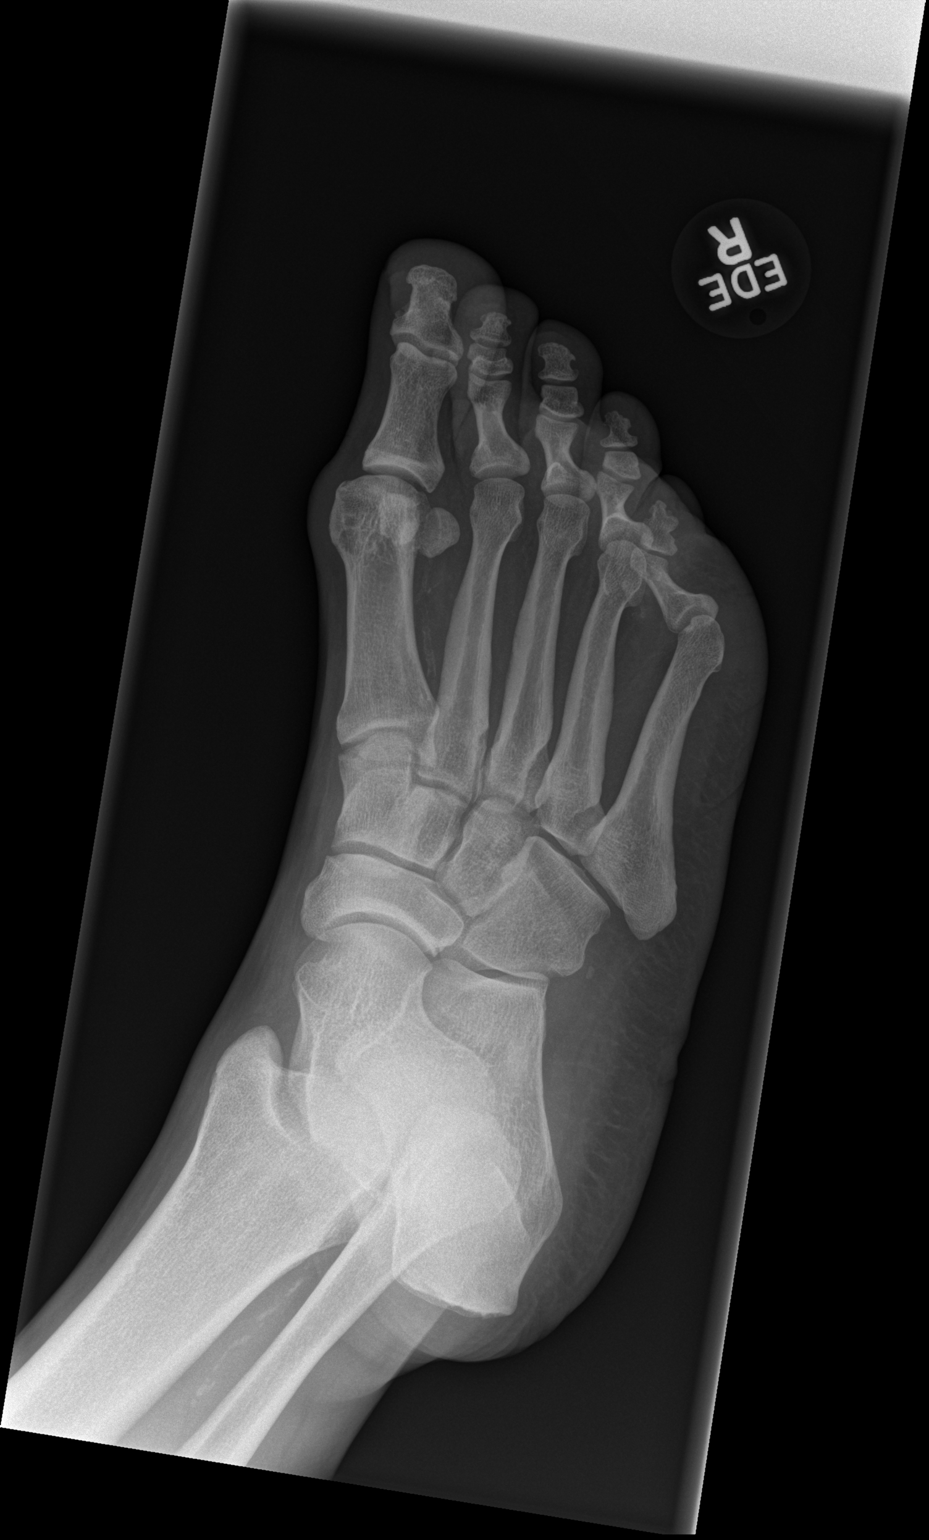

[x foot lat right]
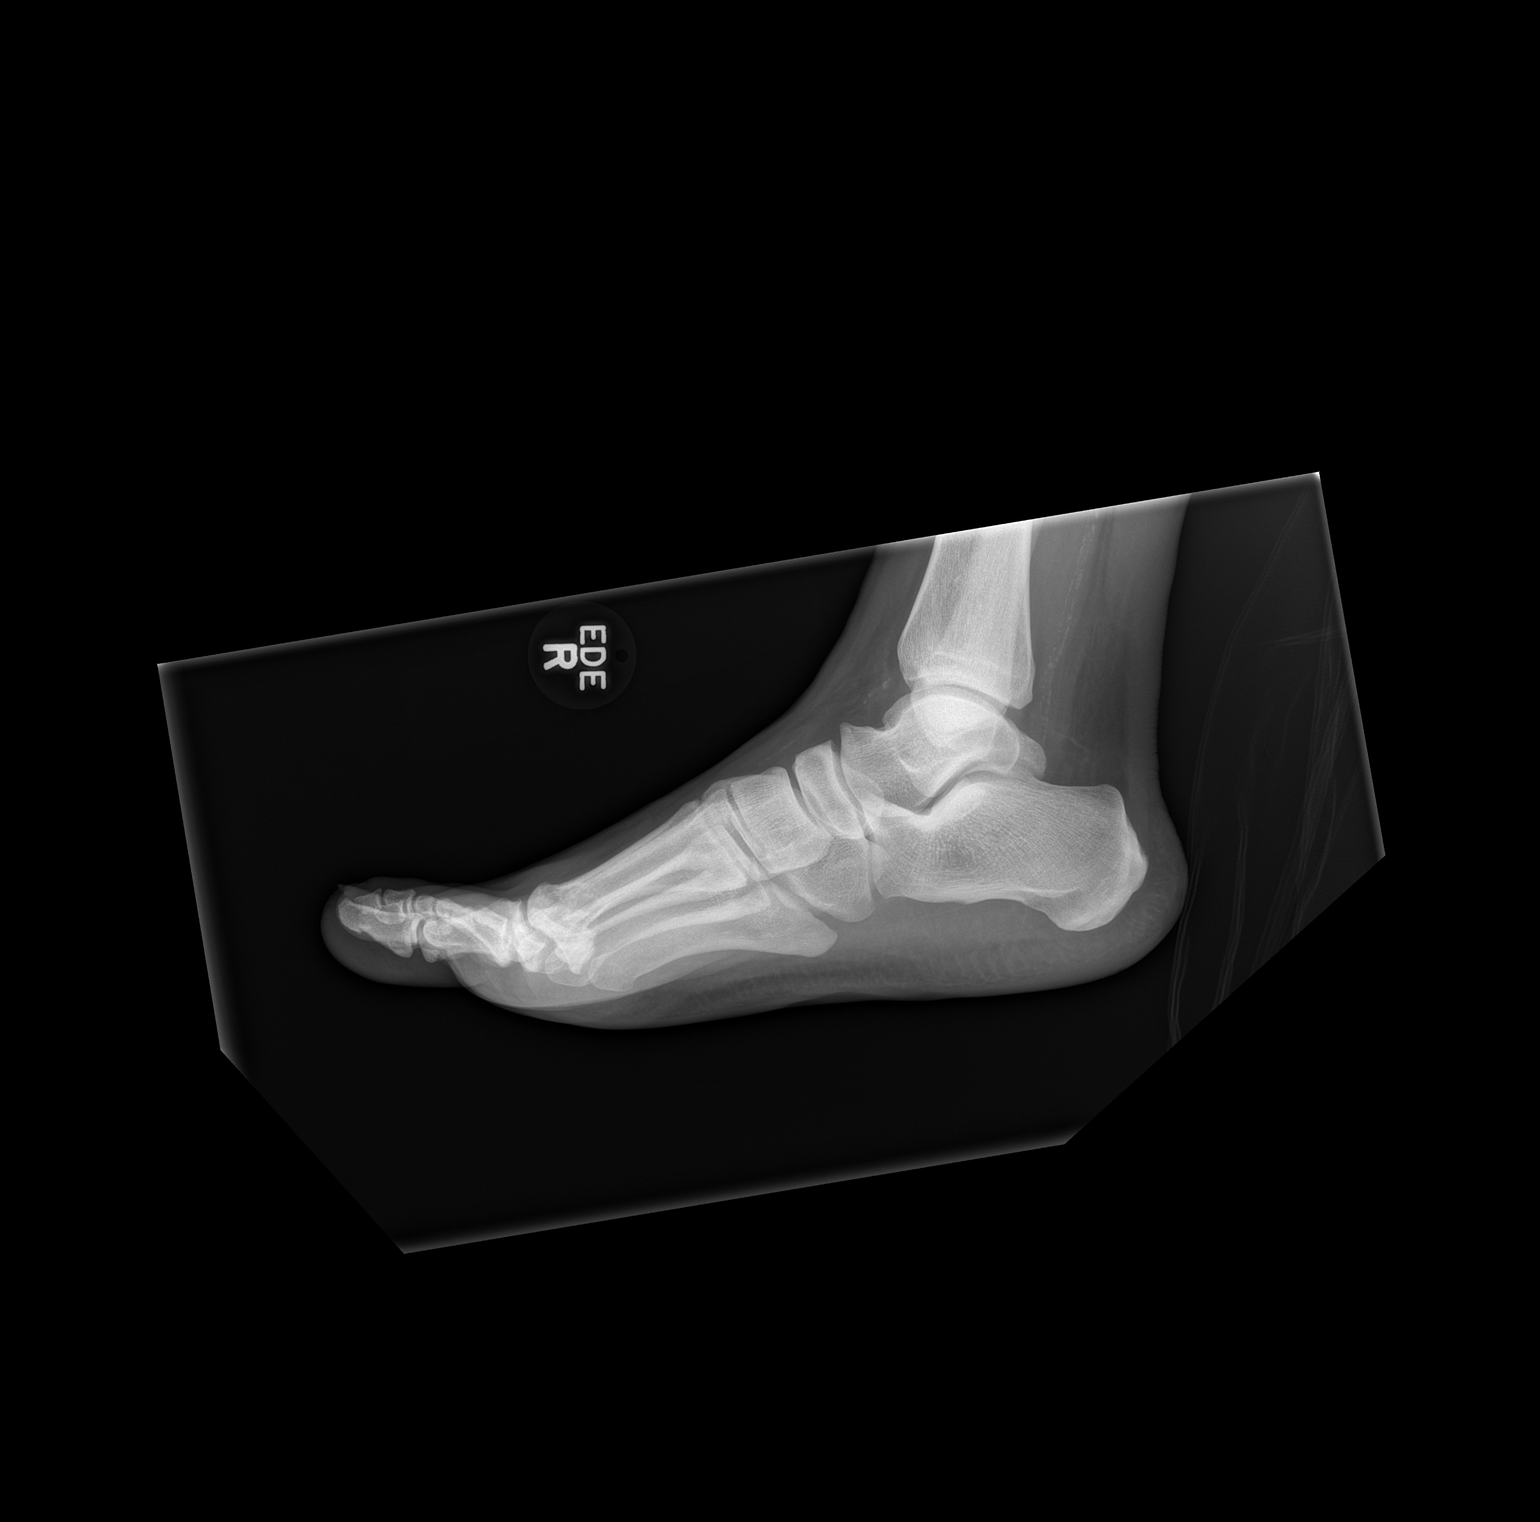

[3 of 3 positions shown; findings below may reference images not displayed]

FINDINGS: There is no evidence of fracture or dislocation. There is hallux
valgus with mild osteoarthritis of the first MTP joint. Soft tissues
are unremarkable. There is peripheral vascular atherosclerotic
disease.
IMPRESSION: No acute osseous injury of the right foot.

## 2014-03-20 ENCOUNTER — Encounter: Payer: Self-pay | Admitting: Vascular Surgery

## 2014-03-21 ENCOUNTER — Ambulatory Visit (INDEPENDENT_AMBULATORY_CARE_PROVIDER_SITE_OTHER): Payer: PRIVATE HEALTH INSURANCE | Admitting: Vascular Surgery

## 2014-03-21 ENCOUNTER — Ambulatory Visit (INDEPENDENT_AMBULATORY_CARE_PROVIDER_SITE_OTHER)
Admission: RE | Admit: 2014-03-21 | Discharge: 2014-03-21 | Disposition: A | Discharge status: Home / Self Care | Payer: 59 | Source: Ambulatory Visit | Attending: Vascular Surgery | Admitting: Vascular Surgery

## 2014-03-21 ENCOUNTER — Ambulatory Visit (HOSPITAL_COMMUNITY)
Admission: RE | Admit: 2014-03-21 | Discharge: 2014-03-21 | Disposition: A | Discharge status: Home / Self Care | Payer: 59 | Source: Ambulatory Visit | Attending: Vascular Surgery | Admitting: Vascular Surgery

## 2014-03-21 ENCOUNTER — Encounter: Payer: Self-pay | Admitting: Vascular Surgery

## 2014-03-21 VITALS — BP 144/74 | HR 69 | Resp 16 | Ht 69.0 in | Wt 223.7 lb

## 2014-03-21 DIAGNOSIS — I739 Peripheral vascular disease, unspecified: Secondary | ICD-10-CM | POA: Insufficient documentation

## 2014-03-21 DIAGNOSIS — Z4931 Encounter for adequacy testing for hemodialysis: Secondary | ICD-10-CM

## 2014-03-21 DIAGNOSIS — E785 Hyperlipidemia, unspecified: Secondary | ICD-10-CM | POA: Diagnosis not present

## 2014-03-21 DIAGNOSIS — Z48812 Encounter for surgical aftercare following surgery on the circulatory system: Secondary | ICD-10-CM | POA: Insufficient documentation

## 2014-03-21 DIAGNOSIS — I10 Essential (primary) hypertension: Secondary | ICD-10-CM | POA: Insufficient documentation

## 2014-03-21 DIAGNOSIS — N186 End stage renal disease: Secondary | ICD-10-CM

## 2014-03-21 NOTE — Progress Notes (Signed)
OFFICE NOTE    CC:  F/u for surgery  HPI:  This is a 43 y.o. male who is s/p 1st stage basilic vein transposition 10/16/13 and and right femoral to below knee popliteal bypass with propaten, common femoral and external iliac endarterectomy and extended profundoplasty on 07/17/13.  He states that he is doing well and his right great toe has also gotten better.  He states that he is not on HD and Dr. Lowell Guitar did not give him any indication when he would go on HD.  He see's him again in 3 months.       Allergies   Allergen  Reactions   .  Vytorin [Ezetimibe-Simvastatin]         Weakness, muscle aches bad.   .  Gabapentin  Other (See Comments)       Twitching       Current Outpatient Prescriptions on File Prior to Visit  Medication Sig Dispense Refill  . amLODipine (NORVASC) 5 MG tablet Take 5 mg by mouth daily.    Marland Kitchen aspirin 81 MG chewable tablet Chew 81 mg by mouth daily.    . diphenhydramine-acetaminophen (TYLENOL PM) 25-500 MG TABS Take 1-2 tablets by mouth at bedtime as needed (for pain and sleep).    . furosemide (LASIX) 80 MG tablet Take 80 mg by mouth 3 (three) times daily.    Marland Kitchen lovastatin (MEVACOR) 20 MG tablet Take 20 mg by mouth daily.    . nitroGLYCERIN (NITROSTAT) 0.4 MG SL tablet Place 1 tablet (0.4 mg total) under the tongue every 5 (five) minutes as needed for chest pain. 25 tablet 11  . carvedilol (COREG) 12.5 MG tablet Take 12.5 mg by mouth 2 (two) times daily with a meal.     . cholecalciferol (VITAMIN D) 400 UNITS TABS tablet Take 400 Units by mouth daily.     No current facility-administered medications on file prior to visit.    ROS:  See HPI  Physical Exam:    Filed Vitals:   03/21/14 1427  BP: 144/74  Pulse: 69  Resp: 16  Height: 5\' 9"  (1.753 m)  Weight: 223 lb 11.2 oz (101.47 kg)   Extremities:  Right foot is warm.  Right great toe continues to heal, Left upper extremity: Healed incisions palpable thrill in fistula  Non-invasive studies: Lower extremity  arterial duplex 03/21/14: 1.  Patent right femoral to popliteal bypass graft  No evidence of narrowing, ABI 1.2 right, left ABI 0.99  ABI's 12/10/13: Right:  0.87 Left:  0.98  Dialysis fistula dupex 12/10/13: Diameter ranges from 0.17cm to 0.56cm.  On today's duplex of the fistula diameter is 3-6 mm however there was an area of narrowing in the proximal third and potentially at the anastomosis   A/P:  This is a 43 y.o. 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  -his bypass is patent and his right great toe is healing.  We will see him back in 3 months for repeat duplex and ABI's of his graft -his fistula is not maturing adequately with a proximal narrowing and possible anastomotic narrowing.  However the vein has dilated somewhat.I offered him second stage transposition as well as revision of the fistula. However he prefer to defer this until early next year. He will follow-up with me again in February. He needs repeat duplex exam of his bypass graft in 3 months as well as an ultrasound of his fistula.   Fabienne Bruns, MD  [facility] Office: [phone] Pager: [phone]

## 2014-03-26 DIAGNOSIS — Z0279 Encounter for issue of other medical certificate: Secondary | ICD-10-CM

## 2014-03-27 ENCOUNTER — Ambulatory Visit (INDEPENDENT_AMBULATORY_CARE_PROVIDER_SITE_OTHER): Payer: 59 | Admitting: Cardiology

## 2014-03-27 ENCOUNTER — Encounter: Payer: Self-pay | Admitting: Cardiology

## 2014-03-27 VITALS — BP 115/67 | HR 75 | Ht 69.0 in | Wt 224.0 lb

## 2014-03-27 DIAGNOSIS — R079 Chest pain, unspecified: Secondary | ICD-10-CM

## 2014-03-27 DIAGNOSIS — I2510 Atherosclerotic heart disease of native coronary artery without angina pectoris: Secondary | ICD-10-CM

## 2014-03-27 MED ORDER — AMLODIPINE BESYLATE 10 MG PO TABS: 10.0000 mg | ORAL_TABLET | Freq: Every day | ORAL | Status: DC

## 2014-03-27 MED ORDER — RANITIDINE HCL 150 MG PO TABS: 150.0000 mg | ORAL_TABLET | Freq: Two times a day (BID) | ORAL | Status: DC

## 2014-03-27 NOTE — Patient Instructions (Signed)
Your physician recommends that you schedule a follow-up appointment in: 3 months. Your physician has recommended you make the following change in your medication:  Increase your amlodipine to 10 mg daily. Please take (2) of your 5 mg tablets daily until they are finished. Start over the counter zantac 150 mg twice daily. Continue all other medications the same.

## 2014-03-27 NOTE — Progress Notes (Signed)
Clinical Summary [name] is a 43 y.o.male seen today for follow up of the following medical problems. This is a focused visit on his history of CAD and recent chest pain.   1. CAD/ICM  -cath 03/2012 severe distal disease too small for intervention, multiple mild to moderate lesions. Managed medically  echo 11/2013 LVEF 50-55%, grade II diastolic dysfunction, inferior hypokinesis.  - last visit he reported some chest pain x 3-4 weeks. Tightness left chest, 5/10. Can occur at rest or with exertion. + SOB. Can last a few minutes or up to 3-4 hours. Notes sometimes can get this feeling after drinking fluids, however they also can be exertional - 02/2014 Lexiscan MPI inferior scar without ischemia, LVEF 33%.  - since last visit continues to have some occassional symptoms.   Past Medical History  Diagnosis Date  . Essential hypertension, benign   . Mixed hyperlipidemia   . Coronary atherosclerosis of native coronary artery     100% apical LAD and distal OM1, Rx medically - 11/13  . Ischemic cardiomyopathy     LVEF 40-45%  . RBBB   . PAD (peripheral artery disease)     Normal ABIs, 2011; focal left EIA stenosis; mild bilateral distal SFA disease, 2008  . CHF (congestive heart failure)   . Anginal pain   . Type 2 diabetes mellitus   . History of blood transfusion 1988    "arm went thru glass window" (10/12/2012)  . CKD (chronic kidney disease) stage 3, GFR 30-59 ml/min   . Gouty arthritis   . Depression   . NSTEMI (non-ST elevated myocardial infarction) 02/2012 and 10/2012  . Shortness of breath     when he has fluid he gets sob     Allergies  Allergen Reactions  . Vytorin [Ezetimibe-Simvastatin]     Weakness, muscle aches bad.  . Gabapentin Other (See Comments)    Twitching     Current Outpatient Prescriptions  Medication Sig Dispense Refill  . amLODipine (NORVASC) 5 MG tablet Take 5 mg by mouth daily.    Marland Kitchen aspirin 81 MG chewable tablet Chew 81 mg by mouth daily.    .  carvedilol (COREG) 12.5 MG tablet Take 12.5 mg by mouth 2 (two) times daily with a meal.     . cholecalciferol (VITAMIN D) 400 UNITS TABS tablet Take 400 Units by mouth daily.    . diphenhydramine-acetaminophen (TYLENOL PM) 25-500 MG TABS Take 1-2 tablets by mouth at bedtime as needed (for pain and sleep).    . furosemide (LASIX) 80 MG tablet Take 80 mg by mouth 3 (three) times daily.    Marland Kitchen lovastatin (MEVACOR) 20 MG tablet Take 20 mg by mouth daily.    . nitroGLYCERIN (NITROSTAT) 0.4 MG SL tablet Place 1 tablet (0.4 mg total) under the tongue every 5 (five) minutes as needed for chest pain. 25 tablet 11   No current facility-administered medications for this visit.     Past Surgical History  Procedure Laterality Date  . Arm surgery Right [phone]    "@ least 3 ORs; took nerves out of my left leg and put them in my arm" (10/12/2012)  . Cardiac catheterization  03/28/12    20% prox and mid LAD, 100% distal LAD, 30% prox OM, 100% distal OM, 40% prox RCA, 40% mid RCA, 25% distal RCA, small PDA w/ 80% diffuse dz, PLB small w/ 50% diffuse stenoses. No focal lesions for PCI. Recommendations for continued medical management and aggressive RF reduction  .  Refractive surgery Bilateral ~ 2005  . Eye surgery Left 1990's    "blood vessel ruptured" (10/12/2012)  . Colonoscopy    . Femoral-popliteal bypass graft Right 07/17/2013    Procedure: BYPASS GRAFT FEMORAL-POPLITEAL ARTERY-RIGHT;  Surgeon: Sherren Kerns, MD;  Location: [facility] OR;  Service: Vascular;  Laterality: Right;  . Endarterectomy femoral Right 07/17/2013    Procedure: ENDARTERECTOMY FEMORAL WITH PROFUNDAPLOASTY;  Surgeon: Sherren Kerns, MD;  Location: [facility] OR;  Service: Vascular;  Laterality: Right;  . Patch angioplasty Right 07/17/2013    Procedure: PATCH ANGIOPLASTY OF COMMON FEMORAL AND PROFUNDA USING SEGMENT OF GREATER SAPHENOUS VEIN ;  Surgeon: Sherren Kerns, MD;  Location: [facility] OR;  Service: Vascular;  Laterality: Right;  . Av fistula  placement Left 10/16/2013    Procedure: CREATION OF LEFT ARM BASILIC TO BRACHIAL FISTULA;  Surgeon: Sherren Kerns, MD;  Location: [facility] OR;  Service: Vascular;  Laterality: Left;     Allergies  Allergen Reactions  . Vytorin [Ezetimibe-Simvastatin]     Weakness, muscle aches bad.  . Gabapentin Other (See Comments)    Twitching      Family History  Problem Relation Age of Onset  . Heart attack Father 9  . Hypertension Father   . Hypertension Mother   . Cancer Mother   . Lupus Mother      Social History [name] reports that he quit smoking about 8 months ago. His smoking use included Cigarettes. He has a 12.5 pack-year smoking history. He has never used smokeless tobacco. [name] reports that he does not drink alcohol.   Review of Systems CONSTITUTIONAL: No weight loss, fever, chills, weakness or fatigue.  HEENT: Eyes: No visual loss, blurred vision, double vision or yellow sclerae.No hearing loss, sneezing, congestion, runny nose or sore throat.  SKIN: No rash or itching.  CARDIOVASCULAR: per HPI RESPIRATORY: No shortness of breath, cough or sputum.  GASTROINTESTINAL: No anorexia, nausea, vomiting or diarrhea. No abdominal pain or blood.  GENITOURINARY: No burning on urination, no polyuria NEUROLOGICAL: No headache, dizziness, syncope, paralysis, ataxia, numbness or tingling in the extremities. No change in bowel or bladder control.  MUSCULOSKELETAL: No muscle, back pain, joint pain or stiffness.  LYMPHATICS: No enlarged nodes. No history of splenectomy.  PSYCHIATRIC: No history of depression or anxiety.  ENDOCRINOLOGIC: No reports of sweating, cold or heat intolerance. No polyuria or polydipsia.  Marland Kitchen   Physical Examination p 75 bp 115/67 Wt 224 lbs BMI 33 Gen: resting comfortably, no acute distress HEENT: no scleral icterus, pupils equal round and reactive, no palptable cervical adenopathy,  CVL: RRR, no m/r/g, no JVD, no carotid bruits Resp: Clear to auscultation  bilaterally GI: abdomen is soft, non-tender, non-distended, normal bowel sounds, no hepatosplenomegaly MSK: extremities are warm, no edema.  Skin: warm, no rash Neuro:  no focal deficits Psych: appropriate affect   Diagnostic Studies Cath 03/2012  Hemodynamic Findings:  Central aortic pressure: 150/100  Left ventricular pressure: 148/17/21  Angiographic Findings:  Left main: No obstructive disease.  Left Anterior Descending Artery: Large vessel that coursed to the apex. The proximal and mid vessel had serial 20% lesions. The distal vessel had 100% occlusion just before the apex. The vessel was 1.75 mm in this location. The diagonal Iseah Plouff was small in caliber and patent with mild diffuse disease.  Circumflex Artery: Large caliber vessel with large first obtuse marginal Josua Ferrebee. There is a 30% stenosis in the proximal portion of the obtuse marginal Domenick Quebedeaux. The distal segment of the  OM Landers Prajapati is 100% occluded.  Right Coronary Artery: Large, dominant vessel with 40% proximal stenosis, long tubular 40% mid stenosis, serial 25% distal stenoses. The PDA is very small caliber and has diffuse 80% stenosis. The Posterolateral Retha Bither is small in caliber and has diffuse 50% stenosis.  Left Ventricular Angiogram: Deferred.  Impression:  1. Triple vessel CAD with occlusion of the apical LAD and distal portion of the first obtuse marginal Artesha Wemhoff. No focal lesions for PCI  2. NSTEMI secondary to above.  Recommendations: Will continue medical management with aggressive risk factor reduction. He will need to stop smoking. We will continue statin, beta blocker. Will continue ASA and will load with Plavix. Will check echo to assess LVEF.  Complications: None. The patient tolerated the procedure well.   03/2012 echo  LVEF 40-45%, mild LVH, multiple WMAs, grade II diastolic dysfunction,   09/2013 Carotid US  Bilateral 1-39% ICA stenosis, patent antegrade vertebrals  02/2014 Lexiscan  MPI IMPRESSION: 1. Inferior wall infarct extending from apex to base. No reversible ischemia.  2. Severe inferior wall hypokinesis.  3. Left ventricular ejection fraction 33%  4. High-risk stress test findings based upon extent of myocardial scar and severely reduced left ventricular systolic function.  11/2013 Echo Study Conclusions  - Left ventricle: The cavity size was mildly dilated. Wall thickness was increased in a pattern of mild LVH. Systolic function was normal. The estimated ejection fraction was approximately 55%. Features are consistent with a pseudonormal left ventricular filling pattern, with concomitant abnormal relaxation and increased filling pressure (grade 2 diastolic dysfunction). Doppler parameters are consistent with high ventricular filling pressure. - Regional wall motion abnormality: Moderate hypokinesis of the basal inferior and mid inferolateral myocardium; mild hypokinesis of the mid inferior, basal-mid anterolateral, and apical lateral myocardium. - Aortic valve: Mildly thickened leaflets. - Mitral valve: Mildly thickened leaflets . There was trivial regurgitation. - Tricuspid valve: There was trivial regurgitation. - Pulmonic valve: There was trivial regurgitation.   Assessment and Plan  1. CAD/ICM/chronic systolic HF - has had some improvement in LVEF, most recently LVEF 55% 11/2013.  - recent cath showed diffuse disease as described above - describes recent episodes of fairly mixed symptoms for cardiac chest pain, MPI with evidence of inferior scar but no active ischemia. Continue medical therapy, increase coreg to 18.75mg  bid for additional antianginal effects     F/u 3 months     Antoine PocheJonathan F. Chin Wachter, M.D.

## 2014-03-28 ENCOUNTER — Telehealth: Payer: Self-pay | Admitting: *Deleted

## 2014-03-28 MED ORDER — CARVEDILOL 12.5 MG PO TABS: 18.7500 mg | ORAL_TABLET | Freq: Two times a day (BID) | ORAL | Status: DC

## 2014-03-28 NOTE — Telephone Encounter (Signed)
Received a call from pharmacy in response to dose increase of amlodipine that was sent stating patient has already been on amlodipine 10 mg for quite some time. Please advise.

## 2014-03-28 NOTE — Telephone Encounter (Signed)
Patient informed and verbalized understanding of plan. 

## 2014-03-28 NOTE — Telephone Encounter (Signed)
Continue norvasc 10mg  daily. Increase coreg to 18.75mg  bid   Dominga Ferry MD

## 2014-03-28 NOTE — Telephone Encounter (Signed)
Message left on patient's vm to call office.

## 2014-03-29 NOTE — Telephone Encounter (Signed)
Office note faxed by front staff yesterday morning.

## 2014-04-18 ENCOUNTER — Encounter (HOSPITAL_COMMUNITY): Payer: Self-pay | Admitting: Cardiovascular Disease

## 2014-06-21 ENCOUNTER — Encounter: Payer: Self-pay | Admitting: Cardiology

## 2014-06-21 ENCOUNTER — Ambulatory Visit (INDEPENDENT_AMBULATORY_CARE_PROVIDER_SITE_OTHER): Payer: PRIVATE HEALTH INSURANCE | Admitting: Cardiology

## 2014-06-21 VITALS — BP 129/70 | HR 73 | Ht 69.0 in | Wt 223.0 lb

## 2014-06-21 DIAGNOSIS — I5022 Chronic systolic (congestive) heart failure: Secondary | ICD-10-CM

## 2014-06-21 DIAGNOSIS — I2510 Atherosclerotic heart disease of native coronary artery without angina pectoris: Secondary | ICD-10-CM

## 2014-06-21 DIAGNOSIS — R079 Chest pain, unspecified: Secondary | ICD-10-CM

## 2014-06-21 DIAGNOSIS — E785 Hyperlipidemia, unspecified: Secondary | ICD-10-CM

## 2014-06-21 MED ORDER — CARVEDILOL 25 MG PO TABS: 25.0000 mg | ORAL_TABLET | Freq: Two times a day (BID) | ORAL | Status: DC

## 2014-06-21 NOTE — Progress Notes (Signed)
Clinical Summary [name] is a 44 y.o.male seen today for follow up of the following medical problems.   1. CAD/ICM  -cath 03/2012 severe distal disease too small for intervention, multiple mild to moderate lesions. Managed medically echo 11/2013 LVEF 50-55%, grade II diastolic dysfunction, inferior hypokinesis.  - 02/2014 Lexiscan MPI inferior scar without ischemia, LVEF 33%.   - since last visit cp improved. Can have some symptoms with eating or drinking too much. Often better with ASA.  - denies any significant SOB, though can have some DOE with higher levels of activity.    2. HL  - on lovastatin due to costs, compliant  3. HTN  - compliant with meds  4. PAD  - followed by vascular Dr Darrick Penna  - right femoropopiliteal bypass in March 2015.   5. CKD  - followed by [facility] in [place]  - being considered for dialysis     Past Medical History  Diagnosis Date  . Essential hypertension, benign   . Mixed hyperlipidemia   . Coronary atherosclerosis of native coronary artery     100% apical LAD and distal OM1, Rx medically - 11/13  . Ischemic cardiomyopathy     LVEF 40-45%  . RBBB   . PAD (peripheral artery disease)     Normal ABIs, 2011; focal left EIA stenosis; mild bilateral distal SFA disease, 2008  . CHF (congestive heart failure)   . Anginal pain   . Type 2 diabetes mellitus   . History of blood transfusion 1988    "arm went thru glass window" (10/12/2012)  . CKD (chronic kidney disease) stage 3, GFR 30-59 ml/min   . Gouty arthritis   . Depression   . NSTEMI (non-ST elevated myocardial infarction) 02/2012 and 10/2012  . Shortness of breath     when he has fluid he gets sob     Allergies  Allergen Reactions  . Vytorin [Ezetimibe-Simvastatin]     Weakness, muscle aches bad.  . Gabapentin Other (See Comments)    Twitching     Current Outpatient Prescriptions  Medication Sig Dispense Refill  . amLODipine (NORVASC) 10 MG tablet Take  1 tablet (10 mg total) by mouth daily. 90 tablet 3  . aspirin 81 MG chewable tablet Chew 81 mg by mouth daily.    . carvedilol (COREG) 12.5 MG tablet Take 1.5 tablets (18.75 mg total) by mouth 2 (two) times daily with a meal. 90 tablet 6  . cholecalciferol (VITAMIN D) 400 UNITS TABS tablet Take 400 Units by mouth daily.    . diphenhydramine-acetaminophen (TYLENOL PM) 25-500 MG TABS Take 1-2 tablets by mouth at bedtime as needed (for pain and sleep).    . furosemide (LASIX) 80 MG tablet Take 80 mg by mouth 3 (three) times daily.    Marland Kitchen lovastatin (MEVACOR) 20 MG tablet Take 20 mg by mouth daily.    . nitroGLYCERIN (NITROSTAT) 0.4 MG SL tablet Place 1 tablet (0.4 mg total) under the tongue every 5 (five) minutes as needed for chest pain. 25 tablet 11  . ranitidine (ZANTAC) 150 MG tablet Take 1 tablet (150 mg total) by mouth 2 (two) times daily. 180 tablet 3   No current facility-administered medications for this visit.     Past Surgical History  Procedure Laterality Date  . Arm surgery Right [phone]    "@ least 3 ORs; took nerves out of my left leg and put them in my arm" (10/12/2012)  . Cardiac catheterization  03/28/12  20% prox and mid LAD, 100% distal LAD, 30% prox OM, 100% distal OM, 40% prox RCA, 40% mid RCA, 25% distal RCA, small PDA w/ 80% diffuse dz, PLB small w/ 50% diffuse stenoses. No focal lesions for PCI. Recommendations for continued medical management and aggressive RF reduction  . Refractive surgery Bilateral ~ 2005  . Eye surgery Left 1990's    "blood vessel ruptured" (10/12/2012)  . Colonoscopy    . Femoral-popliteal bypass graft Right 07/17/2013    Procedure: BYPASS GRAFT FEMORAL-POPLITEAL ARTERY-RIGHT;  Surgeon: Sherren Kerns, MD;  Location: [facility] OR;  Service: Vascular;  Laterality: Right;  . Endarterectomy femoral Right 07/17/2013    Procedure: ENDARTERECTOMY FEMORAL WITH PROFUNDAPLOASTY;  Surgeon: Sherren Kerns, MD;  Location: [facility] OR;  Service: Vascular;  Laterality:  Right;  . Patch angioplasty Right 07/17/2013    Procedure: PATCH ANGIOPLASTY OF COMMON FEMORAL AND PROFUNDA USING SEGMENT OF GREATER SAPHENOUS VEIN ;  Surgeon: Sherren Kerns, MD;  Location: [facility] OR;  Service: Vascular;  Laterality: Right;  . Av fistula placement Left 10/16/2013    Procedure: CREATION OF LEFT ARM BASILIC TO BRACHIAL FISTULA;  Surgeon: Sherren Kerns, MD;  Location: [facility] OR;  Service: Vascular;  Laterality: Left;  . Left heart catheterization with coronary angiogram N/A 03/28/2012    Procedure: LEFT HEART CATHETERIZATION WITH CORONARY ANGIOGRAM;  Surgeon: Kathleene Hazel, MD;  Location: [facility] CATH LAB;  Service: Cardiovascular;  Laterality: N/A;  . Lower extremity angiogram Right 07/11/2013    Procedure: LOWER EXTREMITY ANGIOGRAM;  Surgeon: Fransisco Hertz, MD;  Location: [facility] CATH LAB;  Service: Cardiovascular;  Laterality: Right;  rt leg angio     Allergies  Allergen Reactions  . Vytorin [Ezetimibe-Simvastatin]     Weakness, muscle aches bad.  . Gabapentin Other (See Comments)    Twitching      Family History  Problem Relation Age of Onset  . Heart attack Father 68  . Hypertension Father   . Hypertension Mother   . Cancer Mother   . Lupus Mother      Social History [name] reports that he quit smoking about a year ago. His smoking use included Cigarettes. He started smoking about 32 years ago. He has a 12.5 pack-year smoking history. He has never used smokeless tobacco. [name] reports that he does not drink alcohol.   Review of Systems CONSTITUTIONAL: No weight loss, fever, chills, weakness or fatigue.  HEENT: Eyes: No visual loss, blurred vision, double vision or yellow sclerae.No hearing loss, sneezing, congestion, runny nose or sore throat.  SKIN: No rash or itching.  CARDIOVASCULAR: per HPI RESPIRATORY: No shortness of breath, cough or sputum.  GASTROINTESTINAL: No anorexia, nausea, vomiting or diarrhea. No abdominal pain or blood.  GENITOURINARY: No  burning on urination, no polyuria NEUROLOGICAL: No headache, dizziness, syncope, paralysis, ataxia, numbness or tingling in the extremities. No change in bowel or bladder control.  MUSCULOSKELETAL: No muscle, back pain, joint pain or stiffness.  LYMPHATICS: No enlarged nodes. No history of splenectomy.  PSYCHIATRIC: No history of depression or anxiety.  ENDOCRINOLOGIC: No reports of sweating, cold or heat intolerance. No polyuria or polydipsia.  Marland Kitchen   Physical Examination p 73 bp 129/70 Wt 33 BMI 33 Gen: resting comfortably, no acute distress HEENT: no scleral icterus, pupils equal round and reactive, no palptable cervical adenopathy,  CV: RRR, no m/r/g, no JVD, no carotid bruits Resp: Clear to auscultation bilaterally GI: abdomen is soft, non-tender, non-distended, normal bowel sounds, no hepatosplenomegaly MSK:  extremities are warm, no edema.  Skin: warm, no rash Neuro:  no focal deficits Psych: appropriate affect   Diagnostic Studies Cath 03/2012  Hemodynamic Findings:  Central aortic pressure: 150/100  Left ventricular pressure: 148/17/21  Angiographic Findings:  Left main: No obstructive disease.  Left Anterior Descending Artery: Large vessel that coursed to the apex. The proximal and mid vessel had serial 20% lesions. The distal vessel had 100% occlusion just before the apex. The vessel was 1.75 mm in this location. The diagonal [name] was small in caliber and patent with mild diffuse disease.  Circumflex Artery: Large caliber vessel with large first obtuse marginal [name]. There is a 30% stenosis in the proximal portion of the obtuse marginal [name]. The distal segment of the OM [name] is 100% occluded.  Right Coronary Artery: Large, dominant vessel with 40% proximal stenosis, long tubular 40% mid stenosis, serial 25% distal stenoses. The PDA is very small caliber and has diffuse 80% stenosis. The Posterolateral [name] is small in caliber and has diffuse 50% stenosis.   Left Ventricular Angiogram: Deferred.  Impression:  1. Triple vessel CAD with occlusion of the apical LAD and distal portion of the first obtuse marginal [name]. No focal lesions for PCI  2. NSTEMI secondary to above.  Recommendations: Will continue medical management with aggressive risk factor reduction. He will need to stop smoking. We will continue statin, beta blocker. Will continue ASA and will load with Plavix. Will check echo to assess LVEF.  Complications: None. The patient tolerated the procedure well.   03/2012 echo  LVEF 40-45%, mild LVH, multiple WMAs, grade II diastolic dysfunction,   09/2013 Carotid US  Bilateral 1-39% ICA stenosis, patent antegrade vertebrals  02/2014 Lexiscan MPI IMPRESSION: 1. Inferior wall infarct extending from apex to base. No reversible ischemia.  2. Severe inferior wall hypokinesis.  3. Left ventricular ejection fraction 33%  4. High-risk stress test findings based upon extent of myocardial scar and severely reduced left ventricular systolic function.  11/2013 Echo Study Conclusions  - Left ventricle: The cavity size was mildly dilated. Wall thickness was increased in a pattern of mild LVH. Systolic function was normal. The estimated ejection fraction was approximately 55%. Features are consistent with a pseudonormal left ventricular filling pattern, with concomitant abnormal relaxation and increased filling pressure (grade 2 diastolic dysfunction). Doppler parameters are consistent with high ventricular filling pressure. - Regional wall motion abnormality: Moderate hypokinesis of the basal inferior and mid inferolateral myocardium; mild hypokinesis of the mid inferior, basal-mid anterolateral, and apical lateral myocardium. - Aortic valve: Mildly thickened leaflets. - Mitral valve: Mildly thickened leaflets . There was trivial regurgitation. - Tricuspid valve: There was trivial regurgitation. -  Pulmonic valve: There was trivial regurgitation.     Assessment and Plan  1. CAD/ICM/chronic systolic HF - has had some improvement in LVEF, most recently LVEF 55% 11/2013.  - recent cath showed diffuse disease as described above - describes recent episodes of fairly mixed symptoms for cardiac chest pain, MPI with evidence of inferior scar but no active ischemia. - will increase coreg to 25mg  bid  2. HL  - continue statin, he is awaiting medicaid. Once active he will need a more potent statin.   3. HTN  - continue current meds , at goal  4. PAD  - follow with vascular   5. Tobacco  - he has succesfully stopped   6. CKD - continue to follow with nephrology, appears he is borderline needing HD.     Dorothe Pea.  Harl Bowie, M.D.

## 2014-06-21 NOTE — Patient Instructions (Addendum)
   Increase Coreg to 25mg  twice a day  - you may take 2 tabs of your 12.5mg  tablet twice a day till finish current supply.  New prescription sent to [facility] pharmacy today. Continue all other medications.   Your physician wants you to follow up in: 6 months.  You will receive a reminder letter in the mail one-two months in advance.  If you don't receive a letter, please call our office to schedule the follow up appointment

## 2014-06-26 ENCOUNTER — Encounter: Payer: Self-pay | Admitting: Vascular Surgery

## 2014-06-27 ENCOUNTER — Ambulatory Visit (HOSPITAL_COMMUNITY)
Admission: RE | Admit: 2014-06-27 | Discharge: 2014-06-27 | Disposition: A | Discharge status: Home / Self Care | Payer: PRIVATE HEALTH INSURANCE | Source: Ambulatory Visit | Attending: Vascular Surgery | Admitting: Vascular Surgery

## 2014-06-27 ENCOUNTER — Encounter: Payer: Self-pay | Admitting: Vascular Surgery

## 2014-06-27 ENCOUNTER — Ambulatory Visit (INDEPENDENT_AMBULATORY_CARE_PROVIDER_SITE_OTHER)
Admission: RE | Admit: 2014-06-27 | Discharge: 2014-06-27 | Disposition: A | Discharge status: Home / Self Care | Payer: PRIVATE HEALTH INSURANCE | Source: Ambulatory Visit | Attending: Vascular Surgery | Admitting: Vascular Surgery

## 2014-06-27 ENCOUNTER — Ambulatory Visit (INDEPENDENT_AMBULATORY_CARE_PROVIDER_SITE_OTHER): Payer: PRIVATE HEALTH INSURANCE | Admitting: Vascular Surgery

## 2014-06-27 VITALS — BP 150/70 | HR 73 | Ht 69.0 in | Wt 223.0 lb

## 2014-06-27 DIAGNOSIS — I739 Peripheral vascular disease, unspecified: Secondary | ICD-10-CM | POA: Diagnosis present

## 2014-06-27 DIAGNOSIS — N186 End stage renal disease: Secondary | ICD-10-CM

## 2014-06-27 DIAGNOSIS — Z48812 Encounter for surgical aftercare following surgery on the circulatory system: Secondary | ICD-10-CM

## 2014-06-27 DIAGNOSIS — N184 Chronic kidney disease, stage 4 (severe): Secondary | ICD-10-CM

## 2014-06-27 NOTE — Progress Notes (Signed)
OFFICE NOTE    CC:  F/u for surgery  HPI:  This is a 44 y.o. male who is s/p 1st stage basilic vein transposition 10/16/13 and and right femoral to below knee popliteal bypass with propaten, common femoral and external iliac endarterectomy and extended profundoplasty on 07/17/13.  He states that he is doing well.  he denies symptoms of claudication or rest pain. He has no open wounds on his feet. He states that he is not on HD and Dr. Lowell Guitar did not give him any indication when he would go on HD.  He is scheduled to see Dr. Lowell Guitar soon.     Allergies    Allergen   Reactions    .   Vytorin [Ezetimibe-Simvastatin]             Weakness, muscle aches bad.    .   Gabapentin   Other (See Comments)          Twitching        Current Outpatient Prescriptions on File Prior to Visit   Medication  Sig  Dispense  Refill   .  amLODipine (NORVASC) 5 MG tablet  Take 5 mg by mouth daily.       Marland Kitchen  aspirin 81 MG chewable tablet  Chew 81 mg by mouth daily.       .  diphenhydramine-acetaminophen (TYLENOL PM) 25-500 MG TABS  Take 1-2 tablets by mouth at bedtime as needed (for pain and sleep).       .  furosemide (LASIX) 80 MG tablet  Take 80 mg by mouth 3 (three) times daily.       Marland Kitchen  lovastatin (MEVACOR) 20 MG tablet  Take 20 mg by mouth daily.       .  nitroGLYCERIN (NITROSTAT) 0.4 MG SL tablet  Place 1 tablet (0.4 mg total) under the tongue every 5 (five) minutes as needed for chest pain.  25 tablet  11   .  carvedilol (COREG) 12.5 MG tablet  Take 12.5 mg by mouth 2 (two) times daily with a meal.        .  cholecalciferol (VITAMIN D) 400 UNITS TABS tablet  Take 400 Units by mouth daily.          No current facility-administered medications on file prior to visit.     ROS:  he denies shortness of breath. He denies chest pain. He denies skin itching.  Physical Exam:    Filed Vitals:   06/27/14 1446  BP: 150/70  Pulse: 73  Height: 5\' 9"  (1.753 m)  Weight: 223 lb (101.152 kg)  SpO2: 100%    Extremities:  Right foot is warm.  Right great toe completely healed, Left upper extremity: Healed incisions palpable thrill in fistula  Non-invasive studies:  Patient had an arterial duplex of his bypass graft today. I reviewed and interpreted this study. Patent right femoral to popliteal bypass graft  No evidence of narrowing, ABI 1.05 right, left ABI 1.02  Dialysis fistula dupex 12/10/13: Diameter ranges from 0.17cm to 0.56cm.  On today's duplex of the fistula diameter is 3-7 mm however there was an area of narrowing in the proximal third and potentially at the anastomosis.   A/P:  This is a 44 y.o. 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  -his bypass is patent and his right great toe is healed.  We will see him back in 3 months for repeat duplex and ABI's of  his graft -his fistula still needs second stage procedure and may have possible anastomotic narrowing which could be addressed simultaneously. He is scheduled to see Dr. Lowell Guitar in March of this year. If Dr. Lowell Guitar thinks his kidneys have deteriorated significantly we will go ahead and proceed with a second stage procedure. Otherwise we will consider doing this when he returns for follow-up in 3 months.   Fabienne Bruns, MD [facility] Office: [phone] Pager: [phone]

## 2014-07-01 ENCOUNTER — Telehealth: Payer: Self-pay | Admitting: *Deleted

## 2014-07-01 MED ORDER — ISOSORBIDE MONONITRATE ER 30 MG PO TB24: ORAL_TABLET | ORAL | Status: DC

## 2014-07-01 NOTE — Telephone Encounter (Signed)
Lets start him on imdur 15mg  daily. Have him keep [place] updated on the symptoms.  Dominga Ferry MD

## 2014-07-01 NOTE — Telephone Encounter (Signed)
Pt made aware of Imdur 15 mg daily. Medication sent to pharmacy. Will f/u with pt in 1 week.

## 2014-07-01 NOTE — Addendum Note (Signed)
Addended by: Burman Nieves T on: 07/01/2014 03:58 PM   Modules accepted: Orders

## 2014-07-01 NOTE — Telephone Encounter (Signed)
Pt requesting to see Dr. Wyline Mood. C/o CP same as previous visit on 2/12. Pt says he has seen no improvement with adding carvedilol BID and CP has not improved. Told pt to go to the ED and he refused stating "he doesn't think its that serious", says has not gotten any worse just hasn't gotten any better. Asked for soonest appt in [place] and scheduled for 3/2. Will forward to Dr. Melony Overly

## 2014-07-10 ENCOUNTER — Encounter: Payer: Self-pay | Admitting: Cardiology

## 2014-07-10 ENCOUNTER — Ambulatory Visit (INDEPENDENT_AMBULATORY_CARE_PROVIDER_SITE_OTHER): Payer: PRIVATE HEALTH INSURANCE | Admitting: Cardiology

## 2014-07-10 VITALS — BP 163/80 | HR 74 | Ht 69.0 in | Wt 225.0 lb

## 2014-07-10 DIAGNOSIS — R0789 Other chest pain: Secondary | ICD-10-CM

## 2014-07-10 DIAGNOSIS — I2510 Atherosclerotic heart disease of native coronary artery without angina pectoris: Secondary | ICD-10-CM

## 2014-07-10 NOTE — Patient Instructions (Signed)

## 2014-07-10 NOTE — Progress Notes (Signed)
Clinical Summary Mr. Zentz is a 44 y.o.male seen today for follow up of the following medical problems. This is a focused visit on his history of CAD and recent chest pain.   1. CAD/ICM  -cath 03/2012 severe distal disease too small for intervention, multiple mild to moderate lesions. Managed medically echo 11/2013 LVEF 50-55%, grade II diastolic dysfunction, inferior hypokinesis.  - 02/2014 Lexiscan MPI inferior scar without ischemia, LVEF 33%.   - Over the last few visits he has been having some chest pain symptoms.  - aching/sharp pain with some SOB, left chest. 7/10. Could occur at rest or exertion, but more with exertion. Could be diaphoretic at time. Better with movement. Could be brought on by eating or drinking. Baby ASA made, rubbing could make better. Pain could last for hours consistently.  - symptoms did not improve with increasing his coreg. Significant improvement in symptoms after starting imdur  daily.     Past Medical History  Diagnosis Date  . Essential hypertension, benign   . Mixed hyperlipidemia   . Coronary atherosclerosis of native coronary artery     100% apical LAD and distal OM1, Rx medically - 11/13  . Ischemic cardiomyopathy     LVEF 40-45%  . RBBB   . PAD (peripheral artery disease)     Normal ABIs, 2011; focal left EIA stenosis; mild bilateral distal SFA disease, 2008  . CHF (congestive heart failure)   . Anginal pain   . Type 2 diabetes mellitus   . History of blood transfusion 1988    "arm went thru glass window" (10/12/2012)  . CKD (chronic kidney disease) stage 3, GFR 30-59 ml/min   . Gouty arthritis   . Depression   . NSTEMI (non-ST elevated myocardial infarction) 02/2012 and 10/2012  . Shortness of breath     when he has fluid he gets sob     Allergies  Allergen Reactions  . Vytorin [Ezetimibe-Simvastatin]     Weakness, muscle aches bad.  . Gabapentin Other (See Comments)    Twitching     Current Outpatient Prescriptions    Medication Sig Dispense Refill  . amLODipine (NORVASC) 10 MG tablet Take 1 tablet (10 mg total) by mouth daily. 90 tablet 3  . aspirin 81 MG chewable tablet Chew 81 mg by mouth daily.    . carvedilol (COREG) 25 MG tablet Take 1 tablet (25 mg total) by mouth 2 (two) times daily. 60 tablet 6  . cholecalciferol (VITAMIN D) 400 UNITS TABS tablet Take 400 Units by mouth daily.    . diphenhydramine-acetaminophen (TYLENOL PM) 25-500 MG TABS Take 1-2 tablets by mouth at bedtime as needed (for pain and sleep).    . furosemide (LASIX) 80 MG tablet Take 80 mg by mouth 3 (three) times daily.    . isosorbide mononitrate (IMDUR) 30 MG 24 hr tablet Take 15 mg daily 15 tablet 3  . lovastatin (MEVACOR) 40 MG tablet Take 40 mg by mouth at bedtime.    . nitroGLYCERIN (NITROSTAT) 0.4 MG SL tablet Place 1 tablet (0.4 mg total) under the tongue every 5 (five) minutes as needed for chest pain. 25 tablet 11   No current facility-administered medications for this visit.     Past Surgical History  Procedure Laterality Date  . Arm surgery Right [phone]    "@ least 3 ORs; took nerves out of my left leg and put them in my arm" (10/12/2012)  . Cardiac catheterization  03/28/12    20%  prox and mid LAD, 100% distal LAD, 30% prox OM, 100% distal OM, 40% prox RCA, 40% mid RCA, 25% distal RCA, small PDA w/ 80% diffuse dz, PLB small w/ 50% diffuse stenoses. No focal lesions for PCI. Recommendations for continued medical management and aggressive RF reduction  . Refractive surgery Bilateral ~ 2005  . Eye surgery Left 1990's    "blood vessel ruptured" (10/12/2012)  . Colonoscopy    . Femoral-popliteal bypass graft Right 07/17/2013    Procedure: BYPASS GRAFT FEMORAL-POPLITEAL ARTERY-RIGHT;  Surgeon: Sherren Kerns, MD;  Location: [facility] OR;  Service: Vascular;  Laterality: Right;  . Endarterectomy femoral Right 07/17/2013    Procedure: ENDARTERECTOMY FEMORAL WITH PROFUNDAPLOASTY;  Surgeon: Sherren Kerns, MD;  Location: [facility] OR;   Service: Vascular;  Laterality: Right;  . Patch angioplasty Right 07/17/2013    Procedure: PATCH ANGIOPLASTY OF COMMON FEMORAL AND PROFUNDA USING SEGMENT OF GREATER SAPHENOUS VEIN ;  Surgeon: Sherren Kerns, MD;  Location: [facility] OR;  Service: Vascular;  Laterality: Right;  . Av fistula placement Left 10/16/2013    Procedure: CREATION OF LEFT ARM BASILIC TO BRACHIAL FISTULA;  Surgeon: Sherren Kerns, MD;  Location: [facility] OR;  Service: Vascular;  Laterality: Left;  . Left heart catheterization with coronary angiogram N/A 03/28/2012    Procedure: LEFT HEART CATHETERIZATION WITH CORONARY ANGIOGRAM;  Surgeon: Kathleene Hazel, MD;  Location: [facility] CATH LAB;  Service: Cardiovascular;  Laterality: N/A;  . Lower extremity angiogram Right 07/11/2013    Procedure: LOWER EXTREMITY ANGIOGRAM;  Surgeon: Fransisco Hertz, MD;  Location: [facility] CATH LAB;  Service: Cardiovascular;  Laterality: Right;  rt leg angio     Allergies  Allergen Reactions  . Vytorin [Ezetimibe-Simvastatin]     Weakness, muscle aches bad.  . Gabapentin Other (See Comments)    Twitching      Family History  Problem Relation Age of Onset  . Heart attack Father 42  . Hypertension Father   . Hypertension Mother   . Cancer Mother   . Lupus Mother      Social History Mr. Smialek reports that he quit smoking about a year ago. His smoking use included Cigarettes. He started smoking about 32 years ago. He has a 12.5 pack-year smoking history. He has never used smokeless tobacco. Mr. Greenstone reports that he does not drink alcohol.   Review of Systems CONSTITUTIONAL: No weight loss, fever, chills, weakness or fatigue.  HEENT: Eyes: No visual loss, blurred vision, double vision or yellow sclerae.No hearing loss, sneezing, congestion, runny nose or sore throat.  SKIN: No rash or itching.  CARDIOVASCULAR: per HPI RESPIRATORY: No shortness of breath, cough or sputum.  GASTROINTESTINAL: No anorexia, nausea, vomiting or diarrhea. No abdominal pain  or blood.  GENITOURINARY: No burning on urination, no polyuria NEUROLOGICAL: No headache, dizziness, syncope, paralysis, ataxia, numbness or tingling in the extremities. No change in bowel or bladder control.  MUSCULOSKELETAL: No muscle, back pain, joint pain or stiffness.  LYMPHATICS: No enlarged nodes. No history of splenectomy.  PSYCHIATRIC: No history of depression or anxiety.  ENDOCRINOLOGIC: No reports of sweating, cold or heat intolerance. No polyuria or polydipsia.  Marland Kitchen   Physical Examination p 74 bp 163/80 Wt 225 lbs BMI 33 Gen: resting comfortably, no acute distress HEENT: no scleral icterus, pupils equal round and reactive, no palptable cervical adenopathy,  CV: RRR. No m/r/g, no JVD, no carotid bruits Resp: Clear to auscultation bilaterally GI: abdomen is soft, non-tender, non-distended, normal bowel sounds, no hepatosplenomegaly MSK:  extremities are warm, no edema.  Skin: warm, no rash Neuro:  no focal deficits Psych: appropriate affect   Diagnostic Studies Cath 03/2012  Hemodynamic Findings:  Central aortic pressure: 150/100  Left ventricular pressure: 148/17/21  Angiographic Findings:  Left main: No obstructive disease.  Left Anterior Descending Artery: Large vessel that coursed to the apex. The proximal and mid vessel had serial 20% lesions. The distal vessel had 100% occlusion just before the apex. The vessel was 1.75 mm in this location. The diagonal Caden Fatica was small in caliber and patent with mild diffuse disease.  Circumflex Artery: Large caliber vessel with large first obtuse marginal Shahid Flori. There is a 30% stenosis in the proximal portion of the obtuse marginal Charnay Nazario. The distal segment of the OM Staisha Winiarski is 100% occluded.  Right Coronary Artery: Large, dominant vessel with 40% proximal stenosis, long tubular 40% mid stenosis, serial 25% distal stenoses. The PDA is very small caliber and has diffuse 80% stenosis. The Posterolateral Roshana Shuffield is small in caliber  and has diffuse 50% stenosis.  Left Ventricular Angiogram: Deferred.  Impression:  1. Triple vessel CAD with occlusion of the apical LAD and distal portion of the first obtuse marginal Cleatus Goodin. No focal lesions for PCI  2. NSTEMI secondary to above.  Recommendations: Will continue medical management with aggressive risk factor reduction. He will need to stop smoking. We will continue statin, beta blocker. Will continue ASA and will load with Plavix. Will check echo to assess LVEF.  Complications: None. The patient tolerated the procedure well.   03/2012 echo  LVEF 40-45%, mild LVH, multiple WMAs, grade II diastolic dysfunction,   09/2013 Carotid US  Bilateral 1-39% ICA stenosis, patent antegrade vertebrals  02/2014 Lexiscan MPI IMPRESSION: 1. Inferior wall infarct extending from apex to base. No reversible ischemia.  2. Severe inferior wall hypokinesis.  3. Left ventricular ejection fraction 33%  4. High-risk stress test findings based upon extent of myocardial scar and severely reduced left ventricular systolic function.  11/2013 Echo Study Conclusions  - Left ventricle: The cavity size was mildly dilated. Wall thickness was increased in a pattern of mild LVH. Systolic function was normal. The estimated ejection fraction was approximately 55%. Features are consistent with a pseudonormal left ventricular filling pattern, with concomitant abnormal relaxation and increased filling pressure (grade 2 diastolic dysfunction). Doppler parameters are consistent with high ventricular filling pressure. - Regional wall motion abnormality: Moderate hypokinesis of the basal inferior and mid inferolateral myocardium; mild hypokinesis of the mid inferior, basal-mid anterolateral, and apical lateral myocardium. - Aortic valve: Mildly thickened leaflets. - Mitral valve: Mildly thickened leaflets . There was trivial regurgitation. - Tricuspid valve: There was  trivial regurgitation. - Pulmonic valve: There was trivial regurgitation.      Assessment and Plan  1. CAD/ICM - recent episodes of chest pain mixed in characteristics for cardiac. Recent MPI with inferior scar and no active ischemia.  Symptoms significantly improved with low dose imdur, will continue current medical therapy    F/u 6 months      Antoine Poche, M.D.

## 2014-07-16 ENCOUNTER — Encounter: Payer: Self-pay | Admitting: *Deleted

## 2014-07-16 ENCOUNTER — Telehealth: Payer: Self-pay | Admitting: *Deleted

## 2014-07-16 MED ORDER — NITROGLYCERIN 0.4 MG SL SUBL: 0.4000 mg | SUBLINGUAL_TABLET | SUBLINGUAL | Status: DC | PRN

## 2014-07-16 NOTE — Telephone Encounter (Signed)
Pt called for carvedilol and nitro refills. Pt recently in [facility] for CKD which is being followed by [facility]. Pt Hgb was 7.4 from labs at [facility]. Pt left AMA and has appt with nephrologist tomorrow possible dialysis. Will forward to Dr. Wyline Mood as Lorain Childes and request records/labs from [facility]. Refilled Nitro and pt had 6 refills left of carvedilol

## 2014-07-17 ENCOUNTER — Other Ambulatory Visit: Payer: Self-pay

## 2014-07-19 ENCOUNTER — Encounter (HOSPITAL_COMMUNITY): Admission: RE | Payer: Self-pay | Source: Ambulatory Visit

## 2014-07-19 ENCOUNTER — Ambulatory Visit (HOSPITAL_COMMUNITY)
Admission: RE | Admit: 2014-07-19 | Payer: PRIVATE HEALTH INSURANCE | Source: Ambulatory Visit | Admitting: Vascular Surgery

## 2014-07-19 SURGERY — INSERTION OF DIALYSIS CATHETER
Anesthesia: Choice

## 2014-07-22 ENCOUNTER — Other Ambulatory Visit: Payer: Self-pay | Admitting: Cardiology

## 2014-07-23 ENCOUNTER — Other Ambulatory Visit: Payer: Self-pay

## 2014-08-12 ENCOUNTER — Encounter (HOSPITAL_COMMUNITY): Payer: Self-pay | Admitting: *Deleted

## 2014-08-12 MED ORDER — CHLORHEXIDINE GLUCONATE CLOTH 2 % EX PADS: 6.0000 | MEDICATED_PAD | Freq: Once | CUTANEOUS | Status: DC

## 2014-08-12 MED ORDER — DEXTROSE 5 % IV SOLN
1.5000 g | INTRAVENOUS | Status: AC
  Administered 2014-08-13: 1.5 g via INTRAVENOUS
  Filled 2014-08-12: qty 1.5

## 2014-08-12 MED ORDER — SODIUM CHLORIDE 0.9 % IV SOLN: INTRAVENOUS | Status: DC

## 2014-08-12 NOTE — Progress Notes (Signed)
Pt was not home when I called for pre-op call. Wife, Steward Drone states she usually does this for pt. She states pt has started dialysis (since 07/22/14) and is doing so much better. I asked her about his low Hgb and she states that it has been resolved since starting dialysis. States he's not had sob or chest pain since starting dialysis. She states they were told he no longer has CHF. He quit smoking a year ago. [name] pre-op instructions and she voiced understanding.

## 2014-08-12 NOTE — Progress Notes (Signed)
Anesthesia Chart Review: SAME DAY WORK-UP.  Patient is a 44 year old male posted for second stage basilic vein transposition (BVT) tomorrow by Dr. Darrick Penna.    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 Cardiologist is Dr. Dina Rich, last visit 07/10/14. His chest pain symptoms improved with low dose Imdur and stress test 02/2014 showed inferior scar but no ischemia and continued medical therapy was recommended. Nephrologist is Dr. Lowell Guitar.   02/22/2014 Lexiscan MPI IMPRESSION: 1. Inferior wall infarct extending from apex to base. No reversible ischemia. 2. Severe inferior wall hypokinesis. 3. Left ventricular ejection fraction 33% 4. High-risk stress test findings based upon extent of myocardial scar and severely reduced left ventricular systolic function.  11/07/2013 Echo Study Conclusions - Left ventricle: The cavity size was mildly dilated. Wall thickness was increased in a pattern of mild LVH. Systolic function was normal. The estimated ejection fraction was approximately 55%. Features are consistent with a pseudonormal left ventricular filling pattern, with concomitant abnormalrelaxation and increased filling pressure (grade 2 diastolicdysfunction). Doppler parameters are consistent with highventricular filling pressure. - Regional wall motion abnormality: Moderate hypokinesis of the basal inferior and mid inferolateral myocardium; mild hypokinesis of the mid inferior, basal-mid anterolateral, and apical lateralmyocardium. - Aortic valve: Mildly thickened leaflets. - Mitral valve: Mildly thickened leaflets . There was trivial regurgitation. - Tricuspid valve: There was trivial regurgitation. - Pulmonic valve: There was trivial regurgitation.  Cath 03/27/2012 (Dr.  Verne Carrow) Hemodynamic Findings:  Central aortic pressure: 150/100  Left ventricular pressure: 148/17/21  Angiographic Findings:  Left main: No obstructive disease.  Left Anterior Descending Artery: Large vessel that coursed to the apex. The proximal and mid vessel had serial 20% lesions. The distal vessel had 100% occlusion just before the apex. The vessel was 1.75 mm in this location. The diagonal branch was small in caliber and patent with mild diffuse disease.  Circumflex Artery: Large caliber vessel with large first obtuse marginal branch. There is a 30% stenosis in the proximal portion of the obtuse marginal branch. The distal segment of the OM branch is 100% occluded.  Right Coronary Artery: Large, dominant vessel with 40% proximal stenosis, long tubular 40% mid stenosis, serial 25% distal stenoses. The PDA is very small caliber and has diffuse 80% stenosis. The Posterolateral branch is small in caliber and has diffuse 50% stenosis.  Left Ventricular Angiogram: Deferred.  Impression:  1. 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 No focal lesions for PCI  2. NSTEMI secondary to above.  Recommendations: Will continue medical management with aggressive risk factor reduction. He will need to stop smoking. We will continue statin, beta blocker. Will continue ASA and will load with Plavix. Will check echo to assess LVEF.   09/2013 Carotid US  Bilateral 1-39% ICA stenosis, patent antegrade vertebrals  02/13/14 EKG: SR, RBBB, RAD, consider RVH or pulmonary disease, inferior infarct (old), non-specific T wave abnormality.   He will get labs on arrival and further evaluation by his surgeon and anesthesiologist at that time.  If labs are acceptable and no acute CV symptoms then I would anticipate that he could proceed as planned.  Velna Ochs [facility] Short Stay Center/Anesthesiology Phone [phone] 08/12/2014 12:15 PM

## 2014-08-13 ENCOUNTER — Encounter (HOSPITAL_COMMUNITY): Payer: Self-pay | Admitting: *Deleted

## 2014-08-13 ENCOUNTER — Ambulatory Visit (HOSPITAL_COMMUNITY)
Admission: RE | Admit: 2014-08-13 | Discharge: 2014-08-13 | Disposition: A | Discharge status: Home / Self Care | Payer: PRIVATE HEALTH INSURANCE | Source: Ambulatory Visit | Attending: Vascular Surgery | Admitting: Vascular Surgery

## 2014-08-13 ENCOUNTER — Ambulatory Visit (HOSPITAL_COMMUNITY): Payer: PRIVATE HEALTH INSURANCE | Admitting: Vascular Surgery

## 2014-08-13 ENCOUNTER — Encounter (HOSPITAL_COMMUNITY)
Admission: RE | Disposition: A | Discharge status: Home / Self Care | Payer: Self-pay | Source: Ambulatory Visit | Attending: Vascular Surgery

## 2014-08-13 DIAGNOSIS — I739 Peripheral vascular disease, unspecified: Secondary | ICD-10-CM | POA: Insufficient documentation

## 2014-08-13 DIAGNOSIS — Z992 Dependence on renal dialysis: Secondary | ICD-10-CM | POA: Diagnosis not present

## 2014-08-13 DIAGNOSIS — Z7982 Long term (current) use of aspirin: Secondary | ICD-10-CM | POA: Insufficient documentation

## 2014-08-13 DIAGNOSIS — I509 Heart failure, unspecified: Secondary | ICD-10-CM | POA: Diagnosis not present

## 2014-08-13 DIAGNOSIS — N186 End stage renal disease: Secondary | ICD-10-CM | POA: Diagnosis present

## 2014-08-13 DIAGNOSIS — Z79899 Other long term (current) drug therapy: Secondary | ICD-10-CM | POA: Diagnosis not present

## 2014-08-13 DIAGNOSIS — Z87891 Personal history of nicotine dependence: Secondary | ICD-10-CM | POA: Insufficient documentation

## 2014-08-13 DIAGNOSIS — I120 Hypertensive chronic kidney disease with stage 5 chronic kidney disease or end stage renal disease: Secondary | ICD-10-CM | POA: Diagnosis not present

## 2014-08-13 DIAGNOSIS — I2510 Atherosclerotic heart disease of native coronary artery without angina pectoris: Secondary | ICD-10-CM | POA: Insufficient documentation

## 2014-08-13 DIAGNOSIS — N185 Chronic kidney disease, stage 5: Secondary | ICD-10-CM

## 2014-08-13 DIAGNOSIS — E119 Type 2 diabetes mellitus without complications: Secondary | ICD-10-CM | POA: Diagnosis not present

## 2014-08-13 DIAGNOSIS — I252 Old myocardial infarction: Secondary | ICD-10-CM | POA: Diagnosis not present

## 2014-08-13 HISTORY — PX: BASCILIC VEIN TRANSPOSITION: SHX5742

## 2014-08-13 HISTORY — DX: Anemia, unspecified: D64.9

## 2014-08-13 LAB — POCT I-STAT 4, (NA,K, GLUC, HGB,HCT)
Glucose, Bld: 129 mg/dL — ABNORMAL HIGH (ref 70–99)
HEMATOCRIT: 30 % — AB (ref 39.0–52.0)
HEMOGLOBIN: 10.2 g/dL — AB (ref 13.0–17.0)
Potassium: 3.2 mmol/L — ABNORMAL LOW (ref 3.5–5.1)
Sodium: 139 mmol/L (ref 135–145)

## 2014-08-13 LAB — GLUCOSE, CAPILLARY: Glucose-Capillary: 105 mg/dL — ABNORMAL HIGH (ref 70–99)

## 2014-08-13 SURGERY — TRANSPOSITION, VEIN, BASILIC
Anesthesia: General | Site: Arm Upper | Laterality: Left

## 2014-08-13 MED ORDER — ARTIFICIAL TEARS OP OINT
TOPICAL_OINTMENT | OPHTHALMIC | Status: AC
  Filled 2014-08-13: qty 3.5

## 2014-08-13 MED ORDER — MIDAZOLAM HCL 2 MG/2ML IJ SOLN
INTRAMUSCULAR | Status: AC
  Filled 2014-08-13: qty 2

## 2014-08-13 MED ORDER — THROMBIN 20000 UNITS EX SOLR
CUTANEOUS | Status: AC
  Filled 2014-08-13: qty 20000

## 2014-08-13 MED ORDER — LIDOCAINE HCL (PF) 1 % IJ SOLN
INTRAMUSCULAR | Status: AC
  Filled 2014-08-13: qty 30

## 2014-08-13 MED ORDER — 0.9 % SODIUM CHLORIDE (POUR BTL) OPTIME
TOPICAL | Status: DC | PRN
  Administered 2014-08-13: 1000 mL

## 2014-08-13 MED ORDER — SUCCINYLCHOLINE CHLORIDE 20 MG/ML IJ SOLN
INTRAMUSCULAR | Status: AC
  Filled 2014-08-13: qty 1

## 2014-08-13 MED ORDER — ONDANSETRON HCL 4 MG/2ML IJ SOLN
INTRAMUSCULAR | Status: DC | PRN
  Administered 2014-08-13: 4 mg via INTRAVENOUS

## 2014-08-13 MED ORDER — SODIUM CHLORIDE 0.9 % IV SOLN
INTRAVENOUS | Status: DC | PRN
  Administered 2014-08-13: 07:00:00 via INTRAVENOUS

## 2014-08-13 MED ORDER — PROPOFOL 10 MG/ML IV BOLUS
INTRAVENOUS | Status: DC | PRN
  Administered 2014-08-13: 40 mg via INTRAVENOUS
  Administered 2014-08-13: 50 mg via INTRAVENOUS
  Administered 2014-08-13: 150 mg via INTRAVENOUS

## 2014-08-13 MED ORDER — MIDAZOLAM HCL 5 MG/5ML IJ SOLN
INTRAMUSCULAR | Status: DC | PRN
  Administered 2014-08-13 (×2): 1 mg via INTRAVENOUS

## 2014-08-13 MED ORDER — LIDOCAINE HCL (CARDIAC) 20 MG/ML IV SOLN
INTRAVENOUS | Status: AC
  Filled 2014-08-13: qty 5

## 2014-08-13 MED ORDER — PHENYLEPHRINE HCL 10 MG/ML IJ SOLN
10.0000 mg | INTRAMUSCULAR | Status: DC | PRN
  Administered 2014-08-13: 20 ug/min via INTRAVENOUS

## 2014-08-13 MED ORDER — HEPARIN SODIUM (PORCINE) 1000 UNIT/ML IJ SOLN
INTRAMUSCULAR | Status: AC
  Filled 2014-08-13: qty 1

## 2014-08-13 MED ORDER — SUCCINYLCHOLINE CHLORIDE 20 MG/ML IJ SOLN
INTRAMUSCULAR | Status: DC | PRN
  Administered 2014-08-13: 100 mg via INTRAVENOUS

## 2014-08-13 MED ORDER — PROPOFOL 10 MG/ML IV BOLUS
INTRAVENOUS | Status: AC
  Filled 2014-08-13: qty 20

## 2014-08-13 MED ORDER — EPHEDRINE SULFATE 50 MG/ML IJ SOLN
INTRAMUSCULAR | Status: AC
  Filled 2014-08-13: qty 1

## 2014-08-13 MED ORDER — PROTAMINE SULFATE 10 MG/ML IV SOLN
INTRAVENOUS | Status: DC | PRN
  Administered 2014-08-13: 20 mg via INTRAVENOUS
  Administered 2014-08-13 (×2): 30 mg via INTRAVENOUS

## 2014-08-13 MED ORDER — LIDOCAINE HCL (CARDIAC) 20 MG/ML IV SOLN
INTRAVENOUS | Status: DC | PRN
  Administered 2014-08-13: 60 mg via INTRAVENOUS

## 2014-08-13 MED ORDER — OXYCODONE-ACETAMINOPHEN 5-325 MG PO TABS
1.0000 | ORAL_TABLET | Freq: Once | ORAL | Status: AC
  Administered 2014-08-13: 1 via ORAL

## 2014-08-13 MED ORDER — ROCURONIUM BROMIDE 50 MG/5ML IV SOLN
INTRAVENOUS | Status: AC
  Filled 2014-08-13: qty 1

## 2014-08-13 MED ORDER — OXYCODONE-ACETAMINOPHEN 5-325 MG PO TABS: 1.0000 | ORAL_TABLET | Freq: Four times a day (QID) | ORAL | Status: DC | PRN

## 2014-08-13 MED ORDER — FENTANYL CITRATE 0.05 MG/ML IJ SOLN
INTRAMUSCULAR | Status: AC
  Filled 2014-08-13: qty 5

## 2014-08-13 MED ORDER — HYDROMORPHONE HCL 1 MG/ML IJ SOLN
INTRAMUSCULAR | Status: AC
  Filled 2014-08-13: qty 1

## 2014-08-13 MED ORDER — SODIUM CHLORIDE 0.9 % IJ SOLN
INTRAMUSCULAR | Status: AC
  Filled 2014-08-13: qty 10

## 2014-08-13 MED ORDER — HEPARIN SODIUM (PORCINE) 1000 UNIT/ML IJ SOLN
INTRAMUSCULAR | Status: DC | PRN
  Administered 2014-08-13: 8000 [IU] via INTRAVENOUS
  Administered 2014-08-13: 9000 [IU] via INTRAVENOUS

## 2014-08-13 MED ORDER — HYDROMORPHONE HCL 1 MG/ML IJ SOLN
0.2500 mg | INTRAMUSCULAR | Status: DC | PRN
  Administered 2014-08-13: 0.5 mg via INTRAVENOUS

## 2014-08-13 MED ORDER — FENTANYL CITRATE 0.05 MG/ML IJ SOLN
INTRAMUSCULAR | Status: DC | PRN
  Administered 2014-08-13 (×3): 50 ug via INTRAVENOUS

## 2014-08-13 MED ORDER — SODIUM CHLORIDE 0.9 % IR SOLN
Status: DC | PRN
  Administered 2014-08-13: 500 mL

## 2014-08-13 MED ORDER — PROMETHAZINE HCL 25 MG/ML IJ SOLN: 6.2500 mg | INTRAMUSCULAR | Status: DC | PRN

## 2014-08-13 MED ORDER — PROTAMINE SULFATE 10 MG/ML IV SOLN
INTRAVENOUS | Status: AC
  Filled 2014-08-13: qty 5

## 2014-08-13 MED ORDER — OXYCODONE-ACETAMINOPHEN 5-325 MG PO TABS
ORAL_TABLET | ORAL | Status: AC
  Filled 2014-08-13: qty 1

## 2014-08-13 MED ORDER — ONDANSETRON HCL 4 MG/2ML IJ SOLN
INTRAMUSCULAR | Status: AC
  Filled 2014-08-13: qty 2

## 2014-08-13 SURGICAL SUPPLY — 44 items
CANISTER SUCTION 2500CC (MISCELLANEOUS) ×2 IMPLANT
CANNULA VESSEL 3MM 2 BLNT TIP (CANNULA) IMPLANT
CATH EMB 3FR 80CM (CATHETERS) ×2 IMPLANT
CATH EMB 4FR 80CM (CATHETERS) ×1 IMPLANT
CLIP TI MEDIUM 24 (CLIP) ×2 IMPLANT
CLIP TI WIDE RED SMALL 24 (CLIP) ×2 IMPLANT
COVER PROBE W GEL 5X96 (DRAPES) ×2 IMPLANT
DECANTER SPIKE VIAL GLASS SM (MISCELLANEOUS) ×2 IMPLANT
ELECT REM PT RETURN 9FT ADLT (ELECTROSURGICAL) ×2
ELECTRODE REM PT RTRN 9FT ADLT (ELECTROSURGICAL) ×1 IMPLANT
GEL ULTRASOUND 20GR AQUASONIC (MISCELLANEOUS) IMPLANT
GLOVE BIO SURGEON STRL SZ 6 (GLOVE) ×1 IMPLANT
GLOVE BIO SURGEON STRL SZ 6.5 (GLOVE) ×1 IMPLANT
GLOVE BIO SURGEON STRL SZ7.5 (GLOVE) ×6 IMPLANT
GLOVE BIOGEL PI IND STRL 6.5 (GLOVE) IMPLANT
GLOVE BIOGEL PI IND STRL 7.5 (GLOVE) IMPLANT
GLOVE BIOGEL PI IND STRL 8 (GLOVE) IMPLANT
GLOVE BIOGEL PI INDICATOR 6.5 (GLOVE) ×1
GLOVE BIOGEL PI INDICATOR 7.5 (GLOVE) ×1
GLOVE BIOGEL PI INDICATOR 8 (GLOVE) ×1
GLOVE ECLIPSE 7.0 STRL STRAW (GLOVE) ×1 IMPLANT
GOWN BRE IMP SLV AUR XL STRL (GOWN DISPOSABLE) ×1 IMPLANT
GOWN STRL REUS W/ TWL LRG LVL3 (GOWN DISPOSABLE) ×3 IMPLANT
GOWN STRL REUS W/TWL LRG LVL3 (GOWN DISPOSABLE) ×8
KIT BASIN OR (CUSTOM PROCEDURE TRAY) ×2 IMPLANT
KIT ROOM TURNOVER OR (KITS) ×2 IMPLANT
LIQUID BAND (GAUZE/BANDAGES/DRESSINGS) ×2 IMPLANT
LOOP VESSEL MINI RED (MISCELLANEOUS) IMPLANT
NS IRRIG 1000ML POUR BTL (IV SOLUTION) ×2 IMPLANT
PACK CV ACCESS (CUSTOM PROCEDURE TRAY) ×2 IMPLANT
PAD ARMBOARD 7.5X6 YLW CONV (MISCELLANEOUS) ×4 IMPLANT
SPONGE SURGIFOAM ABS GEL 100 (HEMOSTASIS) IMPLANT
SUT PROLENE 5 0 C 1 24 (SUTURE) ×2 IMPLANT
SUT PROLENE 6 0 BV (SUTURE) ×4 IMPLANT
SUT PROLENE 7 0 BV 1 (SUTURE) ×4 IMPLANT
SUT SILK 2 0 SH (SUTURE) ×2 IMPLANT
SUT SILK 3 0 (SUTURE) ×2
SUT SILK 3-0 18XBRD TIE 12 (SUTURE) IMPLANT
SUT VIC AB 3-0 SH 27 (SUTURE) ×8
SUT VIC AB 3-0 SH 27X BRD (SUTURE) ×1 IMPLANT
SUT VICRYL 4-0 PS2 18IN ABS (SUTURE) ×6 IMPLANT
SYR 3ML LL SCALE MARK (SYRINGE) ×1 IMPLANT
UNDERPAD 30X30 INCONTINENT (UNDERPADS AND DIAPERS) ×2 IMPLANT
WATER STERILE IRR 1000ML POUR (IV SOLUTION) ×2 IMPLANT

## 2014-08-13 NOTE — Transfer of Care (Signed)
Immediate Anesthesia Transfer of Care Note  Patient: [name]  Procedure(s) Performed: Procedure(s): Left arm SECOND STAGE BASILIC VEIN TRANSPOSITION (Left)  Patient Location: PACU  Anesthesia Type:General  Level of Consciousness: awake, alert  and oriented  Airway & Oxygen Therapy: Patient Spontanous Breathing and Patient connected to nasal cannula oxygen  Post-op Assessment: Report given to RN and Post -op Vital signs reviewed and stable  Post vital signs: Reviewed and stable  Last Vitals:  Filed Vitals:   08/13/14 0624  BP: 154/79  Pulse: 77  Temp: 36.7 C  Resp: 18    Complications: No apparent anesthesia complications

## 2014-08-13 NOTE — Op Note (Signed)
Procedure: 2nd stage basilic vein transposition fistula Preop: ESRD Postop: ESRD Anesthesia: General Asst: Lianne [facility] Findings 4-6  mm fistula  Operative details: After obtaining informed consent, the patient was taken to the operating room. The patient was placed in supine position on the operating room table. After administration of general anesthesia, the patient's entire left upper extremity was prepped and draped in usual sterile fashion. A longitudinal incision was made in the left antecubital area. Incision was carried down through the subcutaneous tissues down to the level of the preexisting basilic to brachial artery fistula. The vein was approximately 4-6 mm in diameter. The fistula was dissected free circumferentially and side branches ligated and divided between silk ties. The sensory nerve adjacent to the antecubital area was inadvertantly transected while taking down scar tissue.  The vein also had a H configuration mid upper arm and the vein was duplicated all the way to the axilla.  The vein was also tangled and looped over several nerves in the upper arm which made dissection fairly tedious. Several additional longitudinal incisions were made to dissect out the vein to the level of the axilla.  There was one broad based venous branch which were oversewn with 5 0 prolene while the distal end was ligated with a silk tie.  The remainder of the branches were smaller and ligated and divided between clips or ties. Since the nerves were wrapped around the vein in several locations the only option to bring the vein up to the skin surface was to transect it.  The patient was given 8000 units of heparin.  After 2 minutes the fistula was clamped just above the anastomosis.  The vein was divided at this level.  It was slightly thickened but had a good lumen.  The vein was then pulled out of its deep position and away from the nerves.  It was then tunneled subcutaneously over the biceps back to  the antecubital region.  An end to end anastomosis was then performed using a running 6 0 prolene suture.  Just prior to completion clamps were used to forward and backbleed and flush the fistula.  Anastomosis was secured and clamps released.  The fistula filled immediately and had good doppler flow.  The radial artery also had good doppler flow.  Hemostasis was obtained with direct pressure and 80 mg protamine.The subcutaneous tissues were then closed with running 3 0 vicryl suture. The skin incisions were closed with 4-0 Vicryl subcuticular stitch. At this point the flow in the fistula was noted to be greatly diminished.  The antecubital incision was reopened and the proximal fistula was pulsatile over the first centimeter then no flow.  The artery was dissected free circumferentially proximal and distal to the anastomosis.  Vessel loops were placed around this.  The patient was again given 8000 heparin.  After 2 minutes, the artery was controlled proximally and distally with vessel loops the recent vein to vein anastomosis was taken down.  There was some fresh clot and the intimal hyper plasia previously noted was thought to be obstructive.  This was debrided back to nice clean vein edges and spatulated on both ends.  The anastomosis was then redone vein to vein end to end just above the anastomosis.  Everything was again forebled and backbled and the anastomosis secured.  After the clamps were released there was a much improved thrill.  Hemostasis was obtained.  The subcutaneous tissues were reapproximated with a 3 0 vicryl followed by 4 0 subcuticular  stitch.  Dermabond was applied to all incisions.  The patient tolerated the procedure well and there were no complications. Instrument sponge and needle counts were correct at the end of the case. The patient was taken to the recovery room in stable condition. The patient had an audible radial doppler signal at the end of the case.  Fabienne Bruns, MD

## 2014-08-13 NOTE — Anesthesia Postprocedure Evaluation (Signed)
  Anesthesia Post-op Note  Patient: [name]  Procedure(s) Performed: Procedure(s): Left arm SECOND STAGE BASILIC VEIN TRANSPOSITION (Left)  Patient Location: PACU  Anesthesia Type:General  Level of Consciousness: awake  Airway and Oxygen Therapy: Patient Spontanous Breathing  Post-op Pain: mild  Post-op Assessment: Post-op Vital signs reviewed  Post-op Vital Signs: Reviewed  Last Vitals:  Filed Vitals:   08/13/14 1325  BP:   Pulse: 72  Temp: 36.7 C  Resp: 15    Complications: No apparent anesthesia complications

## 2014-08-13 NOTE — Anesthesia Preprocedure Evaluation (Addendum)
Anesthesia Evaluation  Patient identified by MRN, date of birth, ID band Patient awake    Reviewed: Allergy & Precautions, NPO status , Patient's Chart, lab work & pertinent test results  Airway Mallampati: II  TM Distance: >3 FB Neck ROM: Full    Dental   Pulmonary shortness of breath, former smoker,  breath sounds clear to auscultation        Cardiovascular hypertension, + angina + CAD, + Past MI, + Peripheral Vascular Disease and +CHF + dysrhythmias Rhythm:Regular Rate:Normal     Neuro/Psych    GI/Hepatic negative GI ROS, Neg liver ROS,   Endo/Other  diabetes  Renal/GU Renal disease     Musculoskeletal   Abdominal   Peds  Hematology   Anesthesia Other Findings   Reproductive/Obstetrics                            Anesthesia Physical Anesthesia Plan  ASA: III  Anesthesia Plan: General   Post-op Pain Management:    Induction: Intravenous  Airway Management Planned: LMA  Additional Equipment:   Intra-op Plan:   Post-operative Plan: Possible Post-op intubation/ventilation  Informed Consent: I have reviewed the patients History and Physical, chart, labs and discussed the procedure including the risks, benefits and alternatives for the proposed anesthesia with the patient or authorized representative who has indicated his/her understanding and acceptance.   Dental advisory given  Plan Discussed with: CRNA and Anesthesiologist  Anesthesia Plan Comments:        Anesthesia Quick Evaluation

## 2014-08-13 NOTE — Anesthesia Procedure Notes (Signed)
Procedure Name: Intubation Date/Time: 08/13/2014 7:55 AM Performed by: Leonel Ramsay Pre-anesthesia Checklist: Patient identified, Timeout performed, Emergency Drugs available, Suction available and Patient being monitored Patient Re-evaluated:Patient Re-evaluated prior to inductionOxygen Delivery Method: Circle system utilized Preoxygenation: Pre-oxygenation with 100% oxygen Intubation Type: IV induction Ventilation: Mask ventilation without difficulty and Oral airway inserted - appropriate to patient size Laryngoscope Size: Mac and 3 Grade View: Grade I Tube type: Oral Tube size: 7.5 mm Number of attempts: 1 Airway Equipment and Method: Stylet and Oral airway Placement Confirmation: ETT inserted through vocal cords under direct vision,  positive ETCO2 and breath sounds checked- equal and bilateral Secured at: 22 cm Tube secured with: Tape Dental Injury: Teeth and Oropharynx as per pre-operative assessment  Comments: Unable to adequately seat LMA #4 or #5 and achieve adequate TVs. Easy to mask with 9cm OA until change to GA with ETT. VSS throughout

## 2014-08-13 NOTE — H&P (Signed)
OFFICE NOTE    CC:  F/u for surgery  HPI:  This is a 44 y.o. male who is s/p 1st stage basilic vein transposition 10/16/13 and and right femoral to below knee popliteal bypass with propaten, common femoral and external iliac endarterectomy and extended profundoplasty on 07/17/13.  He states that he is doing well.  he denies symptoms of claudication or rest pain. He has no open wounds on his feet. He is now on HD via right side catheter.    Allergies     Allergen    Reactions     .    Vytorin [Ezetimibe-Simvastatin]                 Weakness, muscle aches bad.     .    Gabapentin    Other (See Comments)             Twitching         No current facility-administered medications on file prior to encounter.   Current Outpatient Prescriptions on File Prior to Encounter  Medication Sig Dispense Refill  . amLODipine (NORVASC) 10 MG tablet Take 1 tablet (10 mg total) by mouth daily. 90 tablet 3  . aspirin 81 MG chewable tablet Chew 81 mg by mouth daily.    . carvedilol (COREG) 25 MG tablet Take 1 tablet (25 mg total) by mouth 2 (two) times daily. 60 tablet 6  . diphenhydramine-acetaminophen (TYLENOL PM) 25-500 MG TABS Take 1-2 tablets by mouth at bedtime as needed (for pain and sleep).    . docusate sodium (COLACE) 100 MG capsule Take 200 mg by mouth daily.    Marland Kitchen lovastatin (MEVACOR) 40 MG tablet Take 40 mg by mouth at bedtime.    . isosorbide mononitrate (IMDUR) 30 MG 24 hr tablet Take 15 mg daily (Patient not taking: Reported on 08/09/2014) 15 tablet 3  . lovastatin (MEVACOR) 40 MG tablet TAKE (1) TABLET BY MOUTH IN THE EVENING. (Patient not taking: Reported on 08/09/2014) 30 tablet 11  . nitroGLYCERIN (NITROSTAT) 0.4 MG SL tablet Place 1 tablet (0.4 mg total) under the tongue every 5 (five) minutes as needed for chest pain. 25 tablet 3      ROS:  he denies shortness of breath. He denies chest pain. He denies skin itching.  Physical Exam:    Filed Vitals:   08/13/14 0624 08/13/14 0625  BP:  154/79   Pulse: 77   Temp: 98.1 F (36.7 C)   TempSrc: Oral   Resp: 18   Height: 5' 9.5" (1.765 m)   Weight: 220 lb (99.791 kg) 203 lb (92.08 kg)  SpO2: 99%     Extremities:  Right foot is warm.  Right great toe completely healed, Left upper extremity: Healed incisions palpable thrill in fistula  Non-invasive studies:   Dialysis fistula dupex 12/10/13: Diameter ranges from 0.17cm to 0.56cm.  On today's duplex of the fistula diameter is 3-7 mm however there was an area of narrowing in the proximal third and potentially at the anastomosis.   A/P:  2nd stage basilic vein transposition today   Fabienne Bruns, MD [facility] [place] Office: [phone] Pager: [phone]

## 2014-08-14 ENCOUNTER — Encounter (HOSPITAL_COMMUNITY): Payer: Self-pay | Admitting: Vascular Surgery

## 2014-08-14 ENCOUNTER — Telehealth: Payer: Self-pay | Admitting: Vascular Surgery

## 2014-08-14 NOTE — Telephone Encounter (Signed)
-----   Message from Sharee Pimple, RN sent at 08/13/2014 12:08 PM EDT ----- Regarding: Schedule   ----- Message -----    From: Lars Mage, PA-C    Sent: 08/13/2014  12:07 PM      To: [facility] Charge Pool  F/U with Dr. Darrick Penna in 4 weeks s/p transposition left av fistula no lab studies needed.

## 2014-08-14 NOTE — Telephone Encounter (Addendum)
Left message for patient to call [facility] for appt information, dpm  08/21/14: spoke with pt to confirm, dpm

## 2014-08-23 ENCOUNTER — Telehealth: Payer: Self-pay | Admitting: *Deleted

## 2014-08-23 NOTE — Telephone Encounter (Signed)
[name] called just now to report that he has had swelling, numbness, and tingling in his left forearm x 2-3 days. He had 2nd stage BVT on 08-13-14 by Dr. Darrick Penna. His hand has been cramping off and on since this surgery but it is not swollen; only the forearm is swollen circumferentially . He reports that he is afebrile and has no erythema or drainage coming from incision. He is able to grasp and hold a glass without any pain but his forearm and hand hurts when elevated. He is now having HD at [facility] on Tuesday, Thursday, and Saturday. We don't have anyone who can see him in the office at this time ( Friday at 1645). He says that he will talk to the [facility] nurse tomorrow at HD and will let us know on Monday if he needs to move up his appt with Dr. Darrick Penna. He is currently scheduled for postop on 09-12-14.  He also says that he still has pain medication left over from discharge; he only takes it when pain is severe. I instructed him to go to the ED if needed.

## 2014-08-27 ENCOUNTER — Telehealth: Payer: Self-pay

## 2014-08-27 NOTE — Telephone Encounter (Signed)
Phone call from nurse @ [facility]; reported pt. Has increased pain, swelling ,and warmth of left arm, below access site.  Also reported pt. C/o intermittent numbness in left hand.  Reported there is a good bruit and thrill.  Requested appt. for evaluation.  Reported pt. dialyzes on T-Th-Sat.  Advised will call pt. with appt. Info.

## 2014-08-28 ENCOUNTER — Encounter: Payer: Self-pay | Admitting: Vascular Surgery

## 2014-08-28 NOTE — Telephone Encounter (Signed)
Tried to call patient on 04/19- no answer Called on 04/20- LM for pt on home #, and cell # Mary at [facility] is aware of appt as well, dpm

## 2014-08-29 ENCOUNTER — Ambulatory Visit (INDEPENDENT_AMBULATORY_CARE_PROVIDER_SITE_OTHER): Payer: Self-pay | Admitting: Vascular Surgery

## 2014-08-29 ENCOUNTER — Encounter: Payer: Self-pay | Admitting: Vascular Surgery

## 2014-08-29 VITALS — BP 156/71 | HR 79 | Temp 97.9°F | Ht 69.5 in | Wt 214.0 lb

## 2014-08-29 DIAGNOSIS — Z992 Dependence on renal dialysis: Secondary | ICD-10-CM

## 2014-08-29 DIAGNOSIS — N186 End stage renal disease: Secondary | ICD-10-CM

## 2014-08-29 NOTE — Progress Notes (Signed)
Patient is a 44 year old male who returns for follow-up today after recent second stage basilic vein transposition fistula. This was done on 08/13/2014. He complains of swelling in the left upper arm and hand. He also complains of numbness on the medial aspect of the left forearm.  Physical exam:  Filed Vitals:   08/29/14 0952  BP: 156/71  Pulse: 79  Temp: 97.9 F (36.6 C)  TempSrc: Oral  Height: 5' 9.5" (1.765 m)  Weight: 214 lb (97.07 kg)  SpO2: 99%    Diffuse edema from the left hand up to the shoulder no significant skin compromise, healing incisions left upper extremity no drainage audible bruit palpable thrill over upper arm fistula  Assessment: Left upper extremity edema secondary to disruption of multiple branches to mobilize his basilic and deep system. Reassure patient that usually this resolves over time. Continue to elevate arm minimize exercise to the left arm until all wounds are completely healed. I do not believe this represents a steal. As far as the numbness in his left forearm is concerned. A sensory nerve that was crossing over the basilic vein was sacrificed during his operation and most likely this will be an area of permanent numbness. Hopefully his body will resolve this over time. This was discussed with the patient today. There was no evidence of infection in the left upper extremity today.  The patient has further follow-up scheduled with me on May 5.  Fabienne Bruns, MD [facility] [place] Office: [phone] Pager: [phone]

## 2014-09-11 ENCOUNTER — Encounter: Payer: Self-pay | Admitting: Vascular Surgery

## 2014-09-12 ENCOUNTER — Ambulatory Visit (INDEPENDENT_AMBULATORY_CARE_PROVIDER_SITE_OTHER): Payer: Self-pay | Admitting: Vascular Surgery

## 2014-09-12 ENCOUNTER — Encounter: Payer: Self-pay | Admitting: Vascular Surgery

## 2014-09-12 VITALS — BP 156/72 | HR 77 | Ht 69.5 in | Wt 217.0 lb

## 2014-09-12 DIAGNOSIS — N186 End stage renal disease: Secondary | ICD-10-CM

## 2014-09-12 DIAGNOSIS — Z48812 Encounter for surgical aftercare following surgery on the circulatory system: Secondary | ICD-10-CM

## 2014-09-12 DIAGNOSIS — Z992 Dependence on renal dialysis: Secondary | ICD-10-CM

## 2014-09-12 DIAGNOSIS — I739 Peripheral vascular disease, unspecified: Secondary | ICD-10-CM

## 2014-09-12 NOTE — Progress Notes (Signed)
Patient is a 44 year old male who returns for follow-up today after recent second stage basilic vein transposition fistula. This was done on 08/13/2014.   Physical exam:  Filed Vitals:   09/12/14 0840  BP: 156/72  Pulse: 77  Height: 5' 9.5" (1.765 m)  Weight: 217 lb (98.431 kg)  SpO2: 100%    lessedema from the left hand up to the shoulder no significant skin compromise, healing incisions left upper extremity, no drainage, audible bruit, palpable thrill over upper arm fistula  Assessment: Left upper extremity edema secondary to disruption of multiple branches to mobilize his basilic and deep system. Reassured patient that usually this resolves over time. Continue to elevate arm minimize exercise to the left arm until all wounds are completely healed.  The patient's fistula should be ready for cannulation in July  He will follow up with me in 6 months for bilateral ABIs and graft duplex of his legs.  Fabienne Bruns, MD [facility] [place] Office: [phone] Pager: [phone]

## 2014-09-12 NOTE — Addendum Note (Signed)
Addended by: Sharee Pimple on: 09/12/2014 11:31 AM   Modules accepted: Orders

## 2014-09-19 ENCOUNTER — Telehealth: Payer: Self-pay | Admitting: Vascular Surgery

## 2014-09-19 ENCOUNTER — Telehealth: Payer: Self-pay

## 2014-09-19 NOTE — Telephone Encounter (Signed)
Phone call from pt.  Reported his left arm Fistula site appears more red and warm, since Dr. Darrick Penna saw him last week.  Denied any fever/ chills.  Denied any separation of the incision or drainage.  Discussed with Dr. Darrick Penna.  Recommended to schedule appt. next Thurs., 5/19.  Advised pt. he will receive a call from our scheduling dept. with appt. for 5/19.  Advised to call office if symptoms worsen prior to 5/19.  Verb. Understanding.

## 2014-09-19 NOTE — Telephone Encounter (Signed)
Notified pt of appt

## 2014-09-24 ENCOUNTER — Encounter: Payer: Self-pay | Admitting: Vascular Surgery

## 2014-09-26 ENCOUNTER — Ambulatory Visit: Payer: Self-pay | Admitting: Vascular Surgery

## 2014-10-10 ENCOUNTER — Other Ambulatory Visit (HOSPITAL_COMMUNITY): Payer: PRIVATE HEALTH INSURANCE

## 2014-10-10 ENCOUNTER — Encounter (HOSPITAL_COMMUNITY): Payer: PRIVATE HEALTH INSURANCE

## 2014-10-10 ENCOUNTER — Ambulatory Visit: Payer: PRIVATE HEALTH INSURANCE | Admitting: Vascular Surgery

## 2015-02-25 ENCOUNTER — Telehealth: Payer: Self-pay | Admitting: Cardiology

## 2015-02-25 NOTE — Telephone Encounter (Signed)
[name] called today stating that he has been having Chest Pains off and on for 2 weeks now.  Went to Dialysis and was told by Nurse to have this checked. Appointment was made for [facility] on 03-03-15 to see Joni Reining.

## 2015-02-27 ENCOUNTER — Other Ambulatory Visit: Payer: Self-pay

## 2015-02-28 ENCOUNTER — Encounter (HOSPITAL_COMMUNITY)
Admission: RE | Disposition: A | Discharge status: Home / Self Care | Payer: Self-pay | Source: Ambulatory Visit | Attending: Vascular Surgery

## 2015-02-28 ENCOUNTER — Ambulatory Visit (HOSPITAL_COMMUNITY)
Admission: RE | Admit: 2015-02-28 | Discharge: 2015-02-28 | Disposition: A | Discharge status: Home / Self Care | Payer: BLUE CROSS/BLUE SHIELD | Source: Ambulatory Visit | Attending: Vascular Surgery | Admitting: Vascular Surgery

## 2015-02-28 ENCOUNTER — Encounter (HOSPITAL_COMMUNITY): Payer: Self-pay | Admitting: Vascular Surgery

## 2015-02-28 DIAGNOSIS — D649 Anemia, unspecified: Secondary | ICD-10-CM | POA: Insufficient documentation

## 2015-02-28 DIAGNOSIS — Z87891 Personal history of nicotine dependence: Secondary | ICD-10-CM | POA: Diagnosis not present

## 2015-02-28 DIAGNOSIS — I2510 Atherosclerotic heart disease of native coronary artery without angina pectoris: Secondary | ICD-10-CM | POA: Diagnosis not present

## 2015-02-28 DIAGNOSIS — F329 Major depressive disorder, single episode, unspecified: Secondary | ICD-10-CM | POA: Insufficient documentation

## 2015-02-28 DIAGNOSIS — T82858A Stenosis of vascular prosthetic devices, implants and grafts, initial encounter: Secondary | ICD-10-CM | POA: Diagnosis present

## 2015-02-28 DIAGNOSIS — I739 Peripheral vascular disease, unspecified: Secondary | ICD-10-CM | POA: Diagnosis not present

## 2015-02-28 DIAGNOSIS — I4510 Unspecified right bundle-branch block: Secondary | ICD-10-CM | POA: Insufficient documentation

## 2015-02-28 DIAGNOSIS — E1122 Type 2 diabetes mellitus with diabetic chronic kidney disease: Secondary | ICD-10-CM | POA: Diagnosis not present

## 2015-02-28 DIAGNOSIS — I509 Heart failure, unspecified: Secondary | ICD-10-CM | POA: Diagnosis not present

## 2015-02-28 DIAGNOSIS — I255 Ischemic cardiomyopathy: Secondary | ICD-10-CM | POA: Diagnosis not present

## 2015-02-28 DIAGNOSIS — I252 Old myocardial infarction: Secondary | ICD-10-CM | POA: Insufficient documentation

## 2015-02-28 DIAGNOSIS — Y832 Surgical operation with anastomosis, bypass or graft as the cause of abnormal reaction of the patient, or of later complication, without mention of misadventure at the time of the procedure: Secondary | ICD-10-CM | POA: Insufficient documentation

## 2015-02-28 DIAGNOSIS — Z7982 Long term (current) use of aspirin: Secondary | ICD-10-CM | POA: Diagnosis not present

## 2015-02-28 DIAGNOSIS — Z992 Dependence on renal dialysis: Secondary | ICD-10-CM | POA: Insufficient documentation

## 2015-02-28 DIAGNOSIS — I130 Hypertensive heart and chronic kidney disease with heart failure and stage 1 through stage 4 chronic kidney disease, or unspecified chronic kidney disease: Secondary | ICD-10-CM | POA: Insufficient documentation

## 2015-02-28 DIAGNOSIS — M1000 Idiopathic gout, unspecified site: Secondary | ICD-10-CM | POA: Diagnosis not present

## 2015-02-28 DIAGNOSIS — E782 Mixed hyperlipidemia: Secondary | ICD-10-CM | POA: Insufficient documentation

## 2015-02-28 DIAGNOSIS — N183 Chronic kidney disease, stage 3 (moderate): Secondary | ICD-10-CM | POA: Diagnosis not present

## 2015-02-28 DIAGNOSIS — T82898A Other specified complication of vascular prosthetic devices, implants and grafts, initial encounter: Secondary | ICD-10-CM | POA: Diagnosis not present

## 2015-02-28 HISTORY — PX: PERIPHERAL VASCULAR CATHETERIZATION: SHX172C

## 2015-02-28 LAB — POCT I-STAT, CHEM 8
BUN: 34 mg/dL — ABNORMAL HIGH (ref 6–20)
CALCIUM ION: 1.22 mmol/L (ref 1.12–1.23)
Chloride: 100 mmol/L — ABNORMAL LOW (ref 101–111)
Creatinine, Ser: 9 mg/dL — ABNORMAL HIGH (ref 0.61–1.24)
Glucose, Bld: 138 mg/dL — ABNORMAL HIGH (ref 65–99)
HCT: 32 % — ABNORMAL LOW (ref 39.0–52.0)
HEMOGLOBIN: 10.9 g/dL — AB (ref 13.0–17.0)
Potassium: 3.9 mmol/L (ref 3.5–5.1)
Sodium: 140 mmol/L (ref 135–145)
TCO2: 27 mmol/L (ref 0–100)

## 2015-02-28 SURGERY — A/V SHUNTOGRAM/FISTULAGRAM
Anesthesia: LOCAL

## 2015-02-28 MED ORDER — ACETAMINOPHEN 325 MG RE SUPP: 325.0000 mg | RECTAL | Status: DC | PRN

## 2015-02-28 MED ORDER — SODIUM CHLORIDE 0.9 % IJ SOLN: 3.0000 mL | INTRAMUSCULAR | Status: DC | PRN

## 2015-02-28 MED ORDER — LIDOCAINE HCL (PF) 1 % IJ SOLN
INTRAMUSCULAR | Status: DC | PRN
  Administered 2015-02-28: 10:00:00

## 2015-02-28 MED ORDER — ACETAMINOPHEN 325 MG PO TABS: 325.0000 mg | ORAL_TABLET | ORAL | Status: DC | PRN

## 2015-02-28 MED ORDER — HYDRALAZINE HCL 20 MG/ML IJ SOLN: 5.0000 mg | INTRAMUSCULAR | Status: DC | PRN

## 2015-02-28 MED ORDER — LIDOCAINE HCL (PF) 1 % IJ SOLN
INTRAMUSCULAR | Status: AC
  Filled 2015-02-28: qty 30

## 2015-02-28 MED ORDER — IODIXANOL 320 MG/ML IV SOLN
INTRAVENOUS | Status: DC | PRN
  Administered 2015-02-28: 55 mL via INTRAVENOUS

## 2015-02-28 MED ORDER — ONDANSETRON HCL 4 MG/2ML IJ SOLN: 4.0000 mg | Freq: Four times a day (QID) | INTRAMUSCULAR | Status: DC | PRN

## 2015-02-28 MED ORDER — METOPROLOL TARTRATE 1 MG/ML IV SOLN: 2.0000 mg | INTRAVENOUS | Status: DC | PRN

## 2015-02-28 MED ORDER — HEPARIN SODIUM (PORCINE) 1000 UNIT/ML IJ SOLN
INTRAMUSCULAR | Status: AC
  Filled 2015-02-28: qty 1

## 2015-02-28 MED ORDER — HEPARIN (PORCINE) IN NACL 2-0.9 UNIT/ML-% IJ SOLN
INTRAMUSCULAR | Status: AC
  Filled 2015-02-28: qty 500

## 2015-02-28 MED ORDER — LABETALOL HCL 5 MG/ML IV SOLN: 10.0000 mg | INTRAVENOUS | Status: DC | PRN

## 2015-02-28 SURGICAL SUPPLY — 17 items
BAG SNAP BAND KOVER 36X36 (MISCELLANEOUS) ×3 IMPLANT
BALLN MUSTANG 6.0X40 75 (BALLOONS) ×3
BALLN MUSTANG 8.0X40 75 (BALLOONS) ×3
BALLOON MUSTANG 6.0X40 75 (BALLOONS) IMPLANT
BALLOON MUSTANG 8.0X40 75 (BALLOONS) ×2 IMPLANT
COVER DOME SNAP 22 D (MISCELLANEOUS) ×3 IMPLANT
COVER PRB 48X5XTLSCP FOLD TPE (BAG) ×2 IMPLANT
COVER PROBE 5X48 (BAG) ×3
KIT ENCORE 26 ADVANTAGE (KITS) ×2 IMPLANT
KIT MICROINTRODUCER STIFF 5F (SHEATH) ×3 IMPLANT
PROTECTION STATION PRESSURIZED (MISCELLANEOUS) ×3
SHEATH PINNACLE R/O II 6F 4CM (SHEATH) ×1 IMPLANT
STATION PROTECTION PRESSURIZED (MISCELLANEOUS) ×2 IMPLANT
STOPCOCK MORSE 400PSI 3WAY (MISCELLANEOUS) ×3 IMPLANT
TRAY PV CATH (CUSTOM PROCEDURE TRAY) ×3 IMPLANT
TUBING CIL FLEX 10 FLL-RA (TUBING) ×3 IMPLANT
WIRE BENTSON .035X145CM (WIRE) ×4 IMPLANT

## 2015-02-28 NOTE — Discharge Instructions (Signed)
Fistulogram, Care After °Refer to this sheet in the next few weeks. These instructions provide you with information on caring for yourself after your procedure. Your health care provider may also give you more specific instructions. Your treatment has been planned according to current medical practices, but problems sometimes occur. Call your health care provider if you have any problems or questions after your procedure. °WHAT TO EXPECT AFTER THE PROCEDURE °After your procedure, it is typical to have the following: °· A small amount of discomfort in the area where the catheters were placed. °· A small amount of bruising around the fistula. °· Sleepiness and fatigue. °HOME CARE INSTRUCTIONS °· Rest at home for the day following your procedure. °· Do not drive or operate heavy machinery while taking pain medicine. °· Take medicines only as directed by your health care provider. °· Do not take baths, swim, or use a hot tub until your health care provider approves. You may shower 24 hours after the procedure or as directed by your health care provider. °· There are many different ways to close and cover an incision, including stitches, skin glue, and adhesive strips. Follow your health care provider's instructions on: °¨ Incision care. °¨ Bandage (dressing) changes and removal. °¨ Incision closure removal. °· Monitor your dialysis fistula carefully. °SEEK MEDICAL CARE IF: °· You have drainage, redness, swelling, or pain at your catheter site. °· You have a fever. °· You have chills. °SEEK IMMEDIATE MEDICAL CARE IF: °· You feel weak. °· You have trouble balancing. °· You have trouble moving your arms or legs. °· You have problems with your speech or vision. °· You can no longer feel a vibration or buzz when you put your fingers over your dialysis fistula. °· The limb that was used for the procedure: °¨ Swells. °¨ Is painful. °¨ Is cold. °¨ Is discolored, such as blue or pale white. °  °This information is not intended  to replace advice given to you by your health care provider. Make sure you discuss any questions you have with your health care provider. °  °Document Released: 09/10/2013 Document Reviewed: 09/10/2013 °Elsevier Interactive Patient Education ©2016 [employer] ° °

## 2015-02-28 NOTE — H&P (Signed)
[facility] SHORT STAY H&P  CC:  Poorly functioning av fistula  HPI: Patient is a 44 year old male noted to have increased venous pressure on dialysis.  This was a basilic vein transposition fistula placed in April 2016. The patient currently dialyzes on Monday Wednesday and Friday.  Review of systems: He denies shortness of breath. He denies chest pain.  Past Medical History  Diagnosis Date  . Essential hypertension, benign   . Mixed hyperlipidemia   . Coronary atherosclerosis of native coronary artery     100% apical LAD and distal OM1, Rx medically - 11/13  . Ischemic cardiomyopathy     LVEF 40-45%  . RBBB   . PAD (peripheral artery disease)     Normal ABIs, 2011; focal left EIA stenosis; mild bilateral distal SFA disease, 2008  . CHF (congestive heart failure)   . Anginal pain   . History of blood transfusion 1988    "arm went thru glass window" (10/12/2012)  . Gouty arthritis   . Depression   . NSTEMI (non-ST elevated myocardial infarction) 02/2012 and 10/2012  . Shortness of breath     when he has fluid he gets sob  . Type 2 diabetes mellitus     diet controlled  . CKD (chronic kidney disease) stage 3, GFR 30-59 ml/min     on dialysis M/W/F - started March 2016  . Anemia     FH:  Non-Contributory  Social HX Social History  Substance Use Topics  . Smoking status: Former Smoker -- 0.50 packs/day for 25 years    Types: Cigarettes    Start date: 07/23/1981    Quit date: 07/11/2013  . Smokeless tobacco: Never Used  . 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 Vytorin [Ezetimibe-Simvastatin]     Weakness, muscle aches bad.  . Gabapentin Other (See Comments)    Twitching   No current facility-administered medications on file prior to encounter.   Current Outpatient Prescriptions on File Prior to Encounter  Medication Sig Dispense Refill  . amLODipine  (NORVASC) 10 MG tablet Take 1 tablet (10 mg total) by mouth daily. 90 tablet 3  . aspirin 81 MG chewable tablet Chew 81 mg by mouth daily.    . carvedilol (COREG) 25 MG tablet Take 1 tablet (25 mg total) by mouth 2 (two) times daily. 60 tablet 6  . diphenhydramine-acetaminophen (TYLENOL PM) 25-500 MG TABS Take 1-2 tablets by mouth at bedtime as needed (for pain and sleep).    . docusate sodium (COLACE) 100 MG capsule Take 200 mg by mouth 2 (two) times daily as needed for mild constipation.     . lovastatin (MEVACOR) 40 MG tablet Take 40 mg by mouth at bedtime.    . nitroGLYCERIN (NITROSTAT) 0.4 MG SL tablet Place 1 tablet (0.4 mg total) under the tongue every 5 (five) minutes as needed for chest pain. 25 tablet 3  . colchicine 0.6 MG tablet Take 0.6 mg by mouth daily as needed (gout).       PHYSICAL EXAM  Filed Vitals:   02/28/15 0705  BP: 163/79  Pulse: 80  Temp: 98.3 F (36.8 C)  Resp: 18    General:  WDWN in NAD HENT: WNL Cardiac: RRR, Vascular Exam/Pulses:  Slightly pulsatile left upper arm AV fistula, positive bruit  Extremities without ischemic changes, no Gangrene , no cellulitis; no open wounds;  Neuro A&O x 3; good sensation; motion in all extremities  Impression: Patient with outflow obstruction left arm AV fistula.  Plan: Fistulogram possible intervention today. Risks benefits possible complications and procedure details were discussed with patient and he wishes to proceed. These include but are not limited to bleeding infection contrast reaction rupture of fistula.   Fabienne Bruns @TODAY @ 8:55 AM

## 2015-03-04 ENCOUNTER — Encounter: Payer: Medicaid Other | Admitting: Adult Health

## 2015-03-04 NOTE — Progress Notes (Signed)
Cardiology Office Note   Date:  03/04/2015   ID:  ZYKEL OZMENT, DOB [birth year], MRN [id number]  PCP:  Louie Boston, MD  Cardiologist: Arlington Calix, NP   No chief complaint on file.  ERROR Cancelled appointment

## 2015-03-14 ENCOUNTER — Ambulatory Visit: Payer: Medicaid Other | Admitting: Cardiology

## 2015-03-20 ENCOUNTER — Ambulatory Visit: Payer: Self-pay | Admitting: Vascular Surgery

## 2015-03-20 ENCOUNTER — Encounter (HOSPITAL_COMMUNITY): Payer: Self-pay

## 2015-03-26 ENCOUNTER — Ambulatory Visit: Payer: Medicaid Other | Admitting: Cardiology

## 2015-04-08 ENCOUNTER — Encounter: Payer: Self-pay | Admitting: Vascular Surgery

## 2015-04-10 ENCOUNTER — Encounter (HOSPITAL_COMMUNITY): Payer: Self-pay

## 2015-04-10 ENCOUNTER — Ambulatory Visit: Payer: Self-pay | Admitting: Vascular Surgery

## 2015-04-14 ENCOUNTER — Encounter: Payer: Self-pay | Admitting: Cardiology

## 2015-04-14 ENCOUNTER — Ambulatory Visit (INDEPENDENT_AMBULATORY_CARE_PROVIDER_SITE_OTHER): Payer: BLUE CROSS/BLUE SHIELD | Admitting: Cardiology

## 2015-04-14 VITALS — BP 155/78 | HR 81 | Ht 69.0 in | Wt 221.0 lb

## 2015-04-14 DIAGNOSIS — I5022 Chronic systolic (congestive) heart failure: Secondary | ICD-10-CM | POA: Diagnosis not present

## 2015-04-14 DIAGNOSIS — I2510 Atherosclerotic heart disease of native coronary artery without angina pectoris: Secondary | ICD-10-CM | POA: Diagnosis not present

## 2015-04-14 DIAGNOSIS — R0789 Other chest pain: Secondary | ICD-10-CM | POA: Diagnosis not present

## 2015-04-14 MED ORDER — ALBUTEROL SULFATE HFA 108 (90 BASE) MCG/ACT IN AERS: 2.0000 | INHALATION_SPRAY | Freq: Four times a day (QID) | RESPIRATORY_TRACT | Status: AC | PRN

## 2015-04-14 NOTE — Progress Notes (Signed)
Patient ID: [name], male   DOB: 13-Nov-1970, 44 y.o.   MRN: [id number]     Clinical Summary Mr. Suddreth is a 44 y.o.male seen today for follow up of the following medical problems This is a focused visit on his history of CAD and chest pain.   1. CAD/ICM  -cath 03/2012 severe distal disease too small for intervention, multiple mild to moderate lesions. Managed medically echo 11/2013 LVEF 50-55%, grade II diastolic dysfunction, inferior hypokinesis.  - 02/2014 Lexiscan MPI inferior scar without ischemia, LVEF 33%.   - Over the last few visits he has been having some chest pain symptoms.  - aching/sharp pain with some SOB, left chest. 7/10. Could occur at rest or exertion, but more with exertion. Could be diaphoretic at time. Better with movement. Could be brought on by eating or drinking. Baby ASA made, rubbing could make better. Pain could last for hours consistently.  - symptoms did not improve with increasing his coreg. Significant improvement in symptoms after starting imdur  daily. Headaches on imdur, now off.  - has upcoming stress test at [facility] where he is being evaluated for kidney transplant. Was at [facility] for test but was unable to do because could not get IV. Has f/u on Wednesday.    2.COPD - reports recent PFTs that were abnormal - feels tightness in chest with increased SOB. Can occur at rest or with exertion. Notes some wheezing at times.   Past Medical History  Diagnosis Date  . Essential hypertension, benign   . Mixed hyperlipidemia   . Coronary atherosclerosis of native coronary artery     100% apical LAD and distal OM1, Rx medically - 11/13  . Ischemic cardiomyopathy     LVEF 40-45%  . RBBB   . PAD (peripheral artery disease) (HCC)     Normal ABIs, 2011; focal left EIA stenosis; mild bilateral distal SFA disease, 2008  . CHF (congestive heart failure) (HCC)   . Anginal pain (HCC)   . History of blood transfusion 1988    "arm went thru glass window"  (10/12/2012)  . Gouty arthritis   . Depression   . NSTEMI (non-ST elevated myocardial infarction) (HCC) 02/2012 and 10/2012  . Shortness of breath     when he has fluid he gets sob  . Type 2 diabetes mellitus (HCC)     diet controlled  . CKD (chronic kidney disease) stage 3, GFR 30-59 ml/min     on dialysis M/W/F - started March 2016  . Anemia      Allergies  Allergen Reactions  . Vytorin [Ezetimibe-Simvastatin]     Weakness, muscle aches bad.  . Gabapentin Other (See Comments)    Twitching     Current Outpatient Prescriptions  Medication Sig Dispense Refill  . amLODipine (NORVASC) 10 MG tablet Take 1 tablet (10 mg total) by mouth daily. 90 tablet 3  . aspirin 81 MG chewable tablet Chew 81 mg by mouth daily.    Marland Kitchen b complex-vitamin c-folic acid (NEPHRO-VITE) 0.8 MG TABS tablet Take 1 tablet by mouth at bedtime.    . calcium acetate (PHOSLO) 667 MG capsule Take 2 capsules by mouth 3 (three) times daily.  4  . carvedilol (COREG) 25 MG tablet Take 1 tablet (25 mg total) by mouth 2 (two) times daily. 60 tablet 6  . colchicine 0.6 MG tablet Take 0.6 mg by mouth daily as needed (gout).     Marland Kitchen diphenhydramine-acetaminophen (TYLENOL PM) 25-500 MG TABS Take 1-2 tablets by  mouth at bedtime as needed (for pain and sleep).    . docusate sodium (COLACE) 100 MG capsule Take 200 mg by mouth 2 (two) times daily as needed for mild constipation.     . furosemide (LASIX) 40 MG tablet Take 120 mg by mouth daily.  2  . lidocaine-prilocaine (EMLA) cream Apply 1 application topically as needed. For port access  10  . lovastatin (MEVACOR) 40 MG tablet Take 40 mg by mouth at bedtime.    . nitroGLYCERIN (NITROSTAT) 0.4 MG SL tablet Place 1 tablet (0.4 mg total) under the tongue every 5 (five) minutes as needed for chest pain. 25 tablet 3   No current facility-administered medications for this visit.     Past Surgical History  Procedure Laterality Date  . Arm surgery Right [phone]    "@ least 3 ORs;  took nerves out of my left leg and put them in my arm" (10/12/2012)  . Cardiac catheterization  03/28/12    20% prox and mid LAD, 100% distal LAD, 30% prox OM, 100% distal OM, 40% prox RCA, 40% mid RCA, 25% distal RCA, small PDA w/ 80% diffuse dz, PLB small w/ 50% diffuse stenoses. No focal lesions for PCI. Recommendations for continued medical management and aggressive RF reduction  . Refractive surgery Bilateral ~ 2005  . Eye surgery Left 1990's    "blood vessel ruptured" (10/12/2012)  . Colonoscopy    . Femoral-popliteal bypass graft Right 07/17/2013    Procedure: BYPASS GRAFT FEMORAL-POPLITEAL ARTERY-RIGHT;  Surgeon: Sherren Kerns, MD;  Location: [facility] OR;  Service: Vascular;  Laterality: Right;  . Endarterectomy femoral Right 07/17/2013    Procedure: ENDARTERECTOMY FEMORAL WITH PROFUNDAPLOASTY;  Surgeon: Sherren Kerns, MD;  Location: [facility] OR;  Service: Vascular;  Laterality: Right;  . Patch angioplasty Right 07/17/2013    Procedure: PATCH ANGIOPLASTY OF COMMON FEMORAL AND PROFUNDA USING SEGMENT OF GREATER SAPHENOUS VEIN ;  Surgeon: Sherren Kerns, MD;  Location: [facility] OR;  Service: Vascular;  Laterality: Right;  . Av fistula placement Left 10/16/2013    Procedure: CREATION OF LEFT ARM BASILIC TO BRACHIAL FISTULA;  Surgeon: Sherren Kerns, MD;  Location: [facility] OR;  Service: Vascular;  Laterality: Left;  . Left heart catheterization with coronary angiogram N/A 03/28/2012    Procedure: LEFT HEART CATHETERIZATION WITH CORONARY ANGIOGRAM;  Surgeon: Kathleene Hazel, MD;  Location: [facility] CATH LAB;  Service: Cardiovascular;  Laterality: N/A;  . Lower extremity angiogram Right 07/11/2013    Procedure: LOWER EXTREMITY ANGIOGRAM;  Surgeon: Fransisco Hertz, MD;  Location: [facility] CATH LAB;  Service: Cardiovascular;  Laterality: Right;  rt leg angio  . Bascilic vein transposition Left 08/13/2014    Procedure: Left arm SECOND STAGE BASILIC VEIN TRANSPOSITION;  Surgeon: Sherren Kerns, MD;  Location: [facility] OR;  Service:  Vascular;  Laterality: Left;  . Peripheral vascular catheterization N/A 02/28/2015    Procedure: Fistulagram;  Surgeon: Sherren Kerns, MD;  Location: [facility] INVASIVE CV LAB;  Service: Cardiovascular;  Laterality: N/A;  . Peripheral vascular catheterization Left 02/28/2015    Procedure: A/V Shunt Intervention;  Surgeon: Sherren Kerns, MD;  Location: [facility] INVASIVE CV LAB;  Service: Cardiovascular;  Laterality: Left;     Allergies  Allergen Reactions  . Vytorin [Ezetimibe-Simvastatin]     Weakness, muscle aches bad.  . Gabapentin Other (See Comments)    Twitching      Family History  Problem Relation Age of Onset  . Heart attack Father 50  . Hypertension  Father   . Hypertension Mother   . Cancer Mother   . Lupus Mother      Social History Mr. Philson reports that he quit smoking about 21 months ago. His smoking use included Cigarettes. He started smoking about 33 years ago. He has a 12.5 pack-year smoking history. He has never used smokeless tobacco. Mr. Gipe reports that he does not drink alcohol.   Review of Systems CONSTITUTIONAL: No weight loss, fever, chills, weakness or fatigue.  HEENT: Eyes: No visual loss, blurred vision, double vision or yellow sclerae.No hearing loss, sneezing, congestion, runny nose or sore throat.  SKIN: No rash or itching.  CARDIOVASCULAR: per hpi RESPIRATORY: per hpi GASTROINTESTINAL: No anorexia, nausea, vomiting or diarrhea. No abdominal pain or blood.  GENITOURINARY: No burning on urination, no polyuria NEUROLOGICAL: No headache, dizziness, syncope, paralysis, ataxia, numbness or tingling in the extremities. No change in bowel or bladder control.  MUSCULOSKELETAL: No muscle, back pain, joint pain or stiffness.  LYMPHATICS: No enlarged nodes. No history of splenectomy.  PSYCHIATRIC: No history of depression or anxiety.  ENDOCRINOLOGIC: No reports of sweating, cold or heat intolerance. No polyuria or polydipsia.  Marland Kitchen   Physical  Examination Filed Vitals:   04/14/15 1136  BP: 155/78  Pulse: 81   Filed Vitals:   04/14/15 1136  Height:  (1.753 m)  Weight: 221 lb (100.245 kg)    Gen: resting comfortably, no acute distress HEENT: no scleral icterus, pupils equal round and reactive, no palptable cervical adenopathy,  CV: RRR, no m/r/g, no jvd Resp: Clear to auscultation bilaterally GI: abdomen is soft, non-tender, non-distended, normal bowel sounds, no hepatosplenomegaly MSK: extremities are warm, no edema.  Skin: warm, no rash Neuro:  no focal deficits Psych: appropriate affect   Diagnostic Studies Cath 03/2012  Hemodynamic Findings:  Central aortic pressure: 150/100  Left ventricular pressure: 148/17/21  Angiographic Findings:  Left main: No obstructive disease.  Left Anterior Descending Artery: Large vessel that coursed to the apex. The proximal and mid vessel had serial 20% lesions. The distal vessel had 100% occlusion just before the apex. The vessel was 1.75 mm in this location. The diagonal Yani Coventry was small in caliber and patent with mild diffuse disease.  Circumflex Artery: Large caliber vessel with large first obtuse marginal Deryl Giroux. There is a 30% stenosis in the proximal portion of the obtuse marginal Sindy Mccune. The distal segment of the OM Abanoub Hanken is 100% occluded.  Right Coronary Artery: Large, dominant vessel with 40% proximal stenosis, long tubular 40% mid stenosis, serial 25% distal stenoses. The PDA is very small caliber and has diffuse 80% stenosis. The Posterolateral Jaiyanna Safran is small in caliber and has diffuse 50% stenosis.  Left Ventricular Angiogram: Deferred.  Impression:  1. Triple vessel CAD with occlusion of the apical LAD and distal portion of the first obtuse marginal Benton Tooker. No focal lesions for PCI  2. NSTEMI secondary to above.  Recommendations: Will continue medical management with aggressive risk factor reduction. He will need to stop smoking. We will continue  statin, beta blocker. Will continue ASA and will load with Plavix. Will check echo to assess LVEF.  Complications: None. The patient tolerated the procedure well.   03/2012 echo  LVEF 40-45%, mild LVH, multiple WMAs, grade II diastolic dysfunction,   09/2013 Carotid US  Bilateral 1-39% ICA stenosis, patent antegrade vertebrals  02/2014 Lexiscan MPI IMPRESSION: 1. Inferior wall infarct extending from apex to base. No reversible ischemia.  2. Severe inferior wall hypokinesis.  3. Left ventricular  ejection fraction 33%  4. High-risk stress test findings based upon extent of myocardial scar and severely reduced left ventricular systolic function.  11/2013 Echo Study Conclusions  - Left ventricle: The cavity size was mildly dilated. Wall thickness was increased in a pattern of mild LVH. Systolic function was normal. The estimated ejection fraction was approximately 55%. Features are consistent with a pseudonormal left ventricular filling pattern, with concomitant abnormal relaxation and increased filling pressure (grade 2 diastolic dysfunction). Doppler parameters are consistent with high ventricular filling pressure. - Regional wall motion abnormality: Moderate hypokinesis of the basal inferior and mid inferolateral myocardium; mild hypokinesis of the mid inferior, basal-mid anterolateral, and apical lateral myocardium. - Aortic valve: Mildly thickened leaflets. - Mitral valve: Mildly thickened leaflets . There was trivial regurgitation. - Tricuspid valve: There was trivial regurgitation. - Pulmonic valve: There was trivial regurgitation.     Assessment and Plan   1. CAD/ICM - recent episodes of chest pain mixed in characteristics for cardiac. 02/2014 MPI with inferior scar and no active ischemia. Has repeat nuclear stress later this week at [facility], f/u results. He is now on hemodialysis, if needed cath would be an option.  2. COPD - per  patient reports recent abnormal PFTs - notes some occasional wheezing, will try prn albuterol    F/u 2 months      Antoine Poche, M.D.

## 2015-04-14 NOTE — Patient Instructions (Signed)
Your physician recommends that you schedule a follow-up appointment in: 2 MONTHS WITH DR. BRANCH  Your physician has recommended you make the following change in your medication:   START ALBUTEROL INHALER 2 PUFFS EVERY 6 HOURS AS NEEDED  Thank you for choosing [facility]!!

## 2015-04-21 ENCOUNTER — Telehealth: Payer: Self-pay | Admitting: Cardiology

## 2015-05-20 ENCOUNTER — Encounter: Payer: Medicare Other | Admitting: Cardiology

## 2015-05-20 NOTE — Progress Notes (Signed)
Patient ID: [name], male   DOB: [birth year], 45 y.o.   MRN: [id number]   Encounter opened in error

## 2015-05-23 ENCOUNTER — Encounter: Payer: Medicare Other | Admitting: Cardiology

## 2015-05-23 NOTE — Progress Notes (Signed)
Patient ID: [name], male   DOB: 31-May-1970, 45 y.o.   MRN: [id number]     Clinical Summary [name] is a 45 y.o.male seen today for follow up of the following medical problems.   1. CAD/ICM  -cath 03/2012 severe distal disease too small for intervention, multiple mild to moderate lesions. Managed medically echo 11/2013 LVEF 50-55%, grade II diastolic dysfunction, inferior hypokinesis.  - 02/2014 Lexiscan MPI inferior scar without ischemia, LVEF 33%.  - Over the last few visits he has been having some chest pain symptoms.  - aching/sharp pain with some SOB, left chest. 7/10. Could occur at rest or exertion, but more with exertion. Could be diaphoretic at time. Better with movement. Could be brought on by eating or drinking. Baby ASA made, rubbing could make better. Pain could last for hours consistently.  - symptoms did not improve with increasing his coreg. Significant improvement in symptoms after starting imdur 15mg  daily. Headaches on imdur, now off.  - recent DSE at [facility] showed decreased LVEF with stress. He later had a cath which showed RCA CTO, chronic distal LAD and LCX disease overall unchanged. Managed medically   2.COPD - reports recent PFTs that were abnormal - feels tightness in chest with increased SOB. Can occur at rest or with exertion. Notes some wheezing at times.  2. HL  - on lovastatin due to costs, compliant  3. HTN  - compliant with meds  4. PAD  - followed by vascular Dr Darrick Penna  - right femoropopiliteal bypass in March 2015.   5. CKD  - followed by [facility] in [place]  - being considered for dialysis Past Medical History  Diagnosis Date  . Essential hypertension, benign   . Mixed hyperlipidemia   . Coronary atherosclerosis of native coronary artery     100% apical LAD and distal OM1, Rx medically - 11/13  . Ischemic cardiomyopathy     LVEF 40-45%  . RBBB   . PAD (peripheral artery disease) (HCC)     Normal ABIs, 2011;  focal left EIA stenosis; mild bilateral distal SFA disease, 2008  . CHF (congestive heart failure) (HCC)   . Anginal pain (HCC)   . History of blood transfusion 1988    "arm went thru glass window" (10/12/2012)  . Gouty arthritis   . Depression   . NSTEMI (non-ST elevated myocardial infarction) (HCC) 02/2012 and 10/2012  . Shortness of breath     when he has fluid he gets sob  . Type 2 diabetes mellitus (HCC)     diet controlled  . CKD (chronic kidney disease) stage 3, GFR 30-59 ml/min     on dialysis M/W/F - started March 2016  . Anemia      Allergies  Allergen Reactions  . Vytorin [Ezetimibe-Simvastatin]     Weakness, muscle aches bad.  . Gabapentin Other (See Comments)    Twitching     Current Outpatient Prescriptions  Medication Sig Dispense Refill  . albuterol (PROVENTIL HFA;VENTOLIN HFA) 108 (90 BASE) MCG/ACT inhaler Inhale 2 puffs into the lungs every 6 (six) hours as needed for wheezing or shortness of breath. 1 Inhaler 2  . amLODipine (NORVASC) 10 MG tablet Take 1 tablet (10 mg total) by mouth daily. 90 tablet 3  . aspirin 81 MG chewable tablet Chew 81 mg by mouth daily.    Marland Kitchen b complex-vitamin c-folic acid (NEPHRO-VITE) 0.8 MG TABS tablet Take 1 tablet by mouth at bedtime.    . calcium acetate (PHOSLO)  667 MG capsule Take 2-4 capsules by mouth 3 (three) times daily. And with snacks  4  . carvedilol (COREG) 25 MG tablet Take 1 tablet (25 mg total) by mouth 2 (two) times daily. 60 tablet 6  . colchicine 0.6 MG tablet Take 0.6 mg by mouth 2 (two) times daily as needed (gout).     Marland Kitchen diphenhydramine-acetaminophen (TYLENOL PM) 25-500 MG TABS Take 1-2 tablets by mouth at bedtime as needed (for pain and sleep).    . docusate sodium (COLACE) 100 MG capsule Take 3 mg by mouth 2 (two) times daily as needed for mild constipation.     . furosemide (LASIX) 40 MG tablet Take 120 mg by mouth daily.  2  . lidocaine-prilocaine (EMLA) cream Apply 1 application topically as needed. For port  access  10  . lovastatin (MEVACOR) 40 MG tablet Take 40 mg by mouth at bedtime.    . nitroGLYCERIN (NITROSTAT) 0.4 MG SL tablet Place 1 tablet (0.4 mg total) under the tongue every 5 (five) minutes as needed for chest pain. 25 tablet 3   No current facility-administered medications for this visit.     Past Surgical History  Procedure Laterality Date  . Arm surgery Right [phone]    "@ least 3 ORs; took nerves out of my left leg and put them in my arm" (10/12/2012)  . Cardiac catheterization  03/28/12    20% prox and mid LAD, 100% distal LAD, 30% prox OM, 100% distal OM, 40% prox RCA, 40% mid RCA, 25% distal RCA, small PDA w/ 80% diffuse dz, PLB small w/ 50% diffuse stenoses. No focal lesions for PCI. Recommendations for continued medical management and aggressive RF reduction  . Refractive surgery Bilateral ~ 2005  . Eye surgery Left 1990's    "blood vessel ruptured" (10/12/2012)  . Colonoscopy    . Femoral-popliteal bypass graft Right 07/17/2013    Procedure: BYPASS GRAFT FEMORAL-POPLITEAL ARTERY-RIGHT;  Surgeon: Sherren Kerns, MD;  Location: [facility] OR;  Service: Vascular;  Laterality: Right;  . Endarterectomy femoral Right 07/17/2013    Procedure: ENDARTERECTOMY FEMORAL WITH PROFUNDAPLOASTY;  Surgeon: Sherren Kerns, MD;  Location: [facility] OR;  Service: Vascular;  Laterality: Right;  . Patch angioplasty Right 07/17/2013    Procedure: PATCH ANGIOPLASTY OF COMMON FEMORAL AND PROFUNDA USING SEGMENT OF GREATER SAPHENOUS VEIN ;  Surgeon: Sherren Kerns, MD;  Location: [facility] OR;  Service: Vascular;  Laterality: Right;  . Av fistula placement Left 10/16/2013    Procedure: CREATION OF LEFT ARM BASILIC TO BRACHIAL FISTULA;  Surgeon: Sherren Kerns, MD;  Location: [facility] OR;  Service: Vascular;  Laterality: Left;  . Left heart catheterization with coronary angiogram N/A 03/28/2012    Procedure: LEFT HEART CATHETERIZATION WITH CORONARY ANGIOGRAM;  Surgeon: Kathleene Hazel, MD;  Location: [facility] CATH LAB;   Service: Cardiovascular;  Laterality: N/A;  . Lower extremity angiogram Right 07/11/2013    Procedure: LOWER EXTREMITY ANGIOGRAM;  Surgeon: Fransisco Hertz, MD;  Location: [facility] CATH LAB;  Service: Cardiovascular;  Laterality: Right;  rt leg angio  . Bascilic vein transposition Left 08/13/2014    Procedure: Left arm SECOND STAGE BASILIC VEIN TRANSPOSITION;  Surgeon: Sherren Kerns, MD;  Location: [facility] OR;  Service: Vascular;  Laterality: Left;  . Peripheral vascular catheterization N/A 02/28/2015    Procedure: Fistulagram;  Surgeon: Sherren Kerns, MD;  Location: [facility] INVASIVE CV LAB;  Service: Cardiovascular;  Laterality: N/A;  . Peripheral vascular catheterization Left 02/28/2015    Procedure: A/V  Shunt Intervention;  Surgeon: Sherren Kerns, MD;  Location: [facility] INVASIVE CV LAB;  Service: Cardiovascular;  Laterality: Left;     Allergies  Allergen Reactions  . Vytorin [Ezetimibe-Simvastatin]     Weakness, muscle aches bad.  . Gabapentin Other (See Comments)    Twitching      Family History  Problem Relation Age of Onset  . Heart attack Father 67  . Hypertension Father   . Hypertension Mother   . Cancer Mother   . Lupus Mother      Social History Mr. Hence reports that he quit smoking about 22 months ago. His smoking use included Cigarettes. He started smoking about 33 years ago. He has a 12.5 pack-year smoking history. He has never used smokeless tobacco. Mr. Hoard reports that he does not drink alcohol.   Review of Systems CONSTITUTIONAL: No weight loss, fever, chills, weakness or fatigue.  HEENT: Eyes: No visual loss, blurred vision, double vision or yellow sclerae.No hearing loss, sneezing, congestion, runny nose or sore throat.  SKIN: No rash or itching.  CARDIOVASCULAR:  RESPIRATORY: No shortness of breath, cough or sputum.  GASTROINTESTINAL: No anorexia, nausea, vomiting or diarrhea. No abdominal pain or blood.  GENITOURINARY: No burning on urination, no polyuria NEUROLOGICAL:  No headache, dizziness, syncope, paralysis, ataxia, numbness or tingling in the extremities. No change in bowel or bladder control.  MUSCULOSKELETAL: No muscle, back pain, joint pain or stiffness.  LYMPHATICS: No enlarged nodes. No history of splenectomy.  PSYCHIATRIC: No history of depression or anxiety.  ENDOCRINOLOGIC: No reports of sweating, cold or heat intolerance. No polyuria or polydipsia.  Marland Kitchen   Physical Examination There were no vitals filed for this visit. There were no vitals filed for this visit.  Gen: resting comfortably, no acute distress HEENT: no scleral icterus, pupils equal round and reactive, no palptable cervical adenopathy,  CV Resp: Clear to auscultation bilaterally GI: abdomen is soft, non-tender, non-distended, normal bowel sounds, no hepatosplenomegaly MSK: extremities are warm, no edema.  Skin: warm, no rash Neuro:  no focal deficits Psych: appropriate affect   Diagnostic Studies  Cath 03/2012  Hemodynamic Findings:  Central aortic pressure: 150/100  Left ventricular pressure: 148/17/21  Angiographic Findings:  Left main: No obstructive disease.  Left Anterior Descending Artery: Large vessel that coursed to the apex. The proximal and mid vessel had serial 20% lesions. The distal vessel had 100% occlusion just before the apex. The vessel was 1.75 mm in this location. The diagonal Everly Rubalcava was small in caliber and patent with mild diffuse disease.  Circumflex Artery: Large caliber vessel with large first obtuse marginal Suede Greenawalt. There is a 30% stenosis in the proximal portion of the obtuse marginal Ayannah Faddis. The distal segment of the OM Acy Orsak is 100% occluded.  Right Coronary Artery: Large, dominant vessel with 40% proximal stenosis, long tubular 40% mid stenosis, serial 25% distal stenoses. The PDA is very small caliber and has diffuse 80% stenosis. The Posterolateral Elimelech Houseman is small in caliber and has diffuse 50% stenosis.  Left Ventricular Angiogram:  Deferred.  Impression:  1. Triple vessel CAD with occlusion of the apical LAD and distal portion of the first obtuse marginal Keyani Rigdon. No focal lesions for PCI  2. NSTEMI secondary to above.  Recommendations: Will continue medical management with aggressive risk factor reduction. He will need to stop smoking. We will continue statin, beta blocker. Will continue ASA and will load with Plavix. Will check echo to assess LVEF.  Complications: None. The patient tolerated the procedure well.  03/2012 echo  LVEF 40-45%, mild LVH, multiple WMAs, grade II diastolic dysfunction,   09/2013 Carotid US  Bilateral 1-39% ICA stenosis, patent antegrade vertebrals  02/2014 Lexiscan MPI IMPRESSION: 1. Inferior wall infarct extending from apex to base. No reversible ischemia.  2. Severe inferior wall hypokinesis.  3. Left ventricular ejection fraction 33%  4. High-risk stress test findings based upon extent of myocardial scar and severely reduced left ventricular systolic function.  11/2013 Echo Study Conclusions  - Left ventricle: The cavity size was mildly dilated. Wall thickness was increased in a pattern of mild LVH. Systolic function was normal. The estimated ejection fraction was approximately 55%. Features are consistent with a pseudonormal left ventricular filling pattern, with concomitant abnormal relaxation and increased filling pressure (grade 2 diastolic dysfunction). Doppler parameters are consistent with high ventricular filling pressure. - Regional wall motion abnormality: Moderate hypokinesis of the basal inferior and mid inferolateral myocardium; mild hypokinesis of the mid inferior, basal-mid anterolateral, and apical lateral myocardium. - Aortic valve: Mildly thickened leaflets. - Mitral valve: Mildly thickened leaflets . There was trivial regurgitation. - Tricuspid valve: There was trivial regurgitation. - Pulmonic valve: There was trivial  regurgitation.   04/2015 [facility] Cath (cannot find actual report in Careeverywhere. Results referred to in clinic note) Left Heart Cath done on 12/08 which showed similar disease when compared to [facility] report. Documentation mentioned distal, small disease that is causing his symptoms. No noticeable large defects that could be intervened upon. So he was medically optimized and discharged home on Imdur 15   Assessment and Plan  1. CAD/ICM - recent episodes of chest pain mixed in characteristics for cardiac. 02/2014 MPI with inferior scar and no active ischemia. Has repeat nuclear stress later this week at [facility], f/u results. He is now on hemodialysis, if needed cath would be an option.  2. COPD - per patient reports recent abnormal PFTs - notes some occasional wheezing, will try prn albuterol  2. HL  - continue statin, he is awaiting medicaid. Once active he will need a more potent statin.   3. HTN  - continue current meds , at goal  4. PAD  - follow with vascular    6. CKD - continue to follow with nephrology, appears he is borderline needing HD.   Antoine Poche, M.D., F.A.C.C.

## 2015-05-27 ENCOUNTER — Telehealth: Payer: Self-pay

## 2015-05-27 DIAGNOSIS — R252 Cramp and spasm: Secondary | ICD-10-CM

## 2015-05-27 DIAGNOSIS — Z95828 Presence of other vascular implants and grafts: Secondary | ICD-10-CM

## 2015-05-27 DIAGNOSIS — I739 Peripheral vascular disease, unspecified: Secondary | ICD-10-CM

## 2015-05-27 NOTE — Telephone Encounter (Signed)
Discussed pt's symptoms with Dr. Arbie Cookey.  Advised to schedule for ABI's and Bilateral Lower Extremity Arterial duplex with office appt.

## 2015-05-27 NOTE — Telephone Encounter (Signed)
Phone call from pt.  Reported that he hasn't been able to tolerate a "full" hemodialysis treatment in approx. 1 mo., due to cramping in legs and abdomen.  Stated that he has the cramping in his legs while reclined in the chair during treatment, and when gets up to walk, starts having cramping in the abdomen.  Verbalized that he can't take the pain anymore.  Reported that the cramping starts soon after he starts his dialysis treatment.  [facility] appt. with Dr. Darrick Penna for further evaluation.  Admits to bilateral leg pain with walking, that eases with rest.  C/o cramps in bilat. legs at night.  Denied any open sores of lower extremities.  Denied any leg swelling.  Also reported that he was recently hospitalized at [facility]. for chest pain, and found to have 100% blockage in one of his coronary arteries, and that the plan is to medically treat the blockage in his heart.  Advised will have scheduler call him with appt. information.  Agrees with plan.

## 2015-05-28 NOTE — Telephone Encounter (Signed)
LM for pt re appts- asked that he cb to confirm, dpm

## 2015-05-30 ENCOUNTER — Encounter: Payer: Medicare Other | Admitting: Adult Health

## 2015-05-30 ENCOUNTER — Emergency Department (HOSPITAL_COMMUNITY)
Admission: EM | Admit: 2015-05-30 | Discharge: 2015-05-30 | Disposition: A | Discharge status: Home / Self Care | Payer: BLUE CROSS/BLUE SHIELD | Attending: Emergency Medicine | Admitting: Emergency Medicine

## 2015-05-30 ENCOUNTER — Encounter (HOSPITAL_COMMUNITY): Payer: Self-pay

## 2015-05-30 DIAGNOSIS — R079 Chest pain, unspecified: Secondary | ICD-10-CM | POA: Insufficient documentation

## 2015-05-30 DIAGNOSIS — E782 Mixed hyperlipidemia: Secondary | ICD-10-CM | POA: Diagnosis not present

## 2015-05-30 DIAGNOSIS — R0602 Shortness of breath: Secondary | ICD-10-CM | POA: Diagnosis not present

## 2015-05-30 DIAGNOSIS — Z7982 Long term (current) use of aspirin: Secondary | ICD-10-CM | POA: Insufficient documentation

## 2015-05-30 DIAGNOSIS — I2510 Atherosclerotic heart disease of native coronary artery without angina pectoris: Secondary | ICD-10-CM | POA: Diagnosis not present

## 2015-05-30 DIAGNOSIS — H3562 Retinal hemorrhage, left eye: Secondary | ICD-10-CM | POA: Diagnosis not present

## 2015-05-30 DIAGNOSIS — Z9889 Other specified postprocedural states: Secondary | ICD-10-CM | POA: Insufficient documentation

## 2015-05-30 DIAGNOSIS — I129 Hypertensive chronic kidney disease with stage 1 through stage 4 chronic kidney disease, or unspecified chronic kidney disease: Secondary | ICD-10-CM | POA: Insufficient documentation

## 2015-05-30 DIAGNOSIS — Z862 Personal history of diseases of the blood and blood-forming organs and certain disorders involving the immune mechanism: Secondary | ICD-10-CM | POA: Diagnosis not present

## 2015-05-30 DIAGNOSIS — E119 Type 2 diabetes mellitus without complications: Secondary | ICD-10-CM | POA: Insufficient documentation

## 2015-05-30 DIAGNOSIS — Z87891 Personal history of nicotine dependence: Secondary | ICD-10-CM | POA: Diagnosis not present

## 2015-05-30 DIAGNOSIS — Z8659 Personal history of other mental and behavioral disorders: Secondary | ICD-10-CM | POA: Insufficient documentation

## 2015-05-30 DIAGNOSIS — N183 Chronic kidney disease, stage 3 (moderate): Secondary | ICD-10-CM | POA: Insufficient documentation

## 2015-05-30 DIAGNOSIS — H578 Other specified disorders of eye and adnexa: Secondary | ICD-10-CM | POA: Diagnosis present

## 2015-05-30 DIAGNOSIS — M109 Gout, unspecified: Secondary | ICD-10-CM | POA: Insufficient documentation

## 2015-05-30 DIAGNOSIS — I509 Heart failure, unspecified: Secondary | ICD-10-CM | POA: Diagnosis not present

## 2015-05-30 DIAGNOSIS — I252 Old myocardial infarction: Secondary | ICD-10-CM | POA: Diagnosis not present

## 2015-05-30 LAB — COMPREHENSIVE METABOLIC PANEL
ALBUMIN: 4.2 g/dL (ref 3.5–5.0)
ALK PHOS: 34 U/L — AB (ref 38–126)
ALT: 19 U/L (ref 17–63)
ANION GAP: 13 (ref 5–15)
AST: 15 U/L (ref 15–41)
BILIRUBIN TOTAL: 0.9 mg/dL (ref 0.3–1.2)
BUN: 71 mg/dL — AB (ref 6–20)
CHLORIDE: 101 mmol/L (ref 101–111)
CO2: 25 mmol/L (ref 22–32)
Calcium: 9.2 mg/dL (ref 8.9–10.3)
Creatinine, Ser: 10.61 mg/dL — ABNORMAL HIGH (ref 0.61–1.24)
GFR calc Af Amer: 6 mL/min — ABNORMAL LOW (ref >60)
GFR calc non Af Amer: 5 mL/min — ABNORMAL LOW (ref >60)
GLUCOSE: 178 mg/dL — AB (ref 65–99)
POTASSIUM: 4 mmol/L (ref 3.5–5.1)
Sodium: 139 mmol/L (ref 135–145)
TOTAL PROTEIN: 7.4 g/dL (ref 6.5–8.1)

## 2015-05-30 LAB — CBC WITH DIFFERENTIAL/PLATELET
BASOS PCT: 1 %
Basophils Absolute: 0.1 10*3/uL (ref 0.0–0.1)
Eosinophils Absolute: 0.1 10*3/uL (ref 0.0–0.7)
Eosinophils Relative: 1 %
HEMATOCRIT: 30.1 % — AB (ref 39.0–52.0)
HEMOGLOBIN: 10 g/dL — AB (ref 13.0–17.0)
LYMPHS ABS: 1.9 10*3/uL (ref 0.7–4.0)
Lymphocytes Relative: 28 %
MCH: 30.8 pg (ref 26.0–34.0)
MCHC: 33.2 g/dL (ref 30.0–36.0)
MCV: 92.6 fL (ref 78.0–100.0)
MONOS PCT: 6 %
Monocytes Absolute: 0.4 10*3/uL (ref 0.1–1.0)
NEUTROS ABS: 4.4 10*3/uL (ref 1.7–7.7)
NEUTROS PCT: 64 %
Platelets: 203 10*3/uL (ref 150–400)
RBC: 3.25 MIL/uL — ABNORMAL LOW (ref 4.22–5.81)
RDW: 12.8 % (ref 11.5–15.5)
WBC: 6.9 10*3/uL (ref 4.0–10.5)

## 2015-05-30 LAB — I-STAT CHEM 8, ED
BUN: 66 mg/dL — AB (ref 6–20)
CHLORIDE: 100 mmol/L — AB (ref 101–111)
Calcium, Ion: 1.1 mmol/L — ABNORMAL LOW (ref 1.12–1.23)
Creatinine, Ser: 10.2 mg/dL — ABNORMAL HIGH (ref 0.61–1.24)
Glucose, Bld: 173 mg/dL — ABNORMAL HIGH (ref 65–99)
HEMATOCRIT: 32 % — AB (ref 39.0–52.0)
Hemoglobin: 10.9 g/dL — ABNORMAL LOW (ref 13.0–17.0)
Potassium: 3.9 mmol/L (ref 3.5–5.1)
SODIUM: 139 mmol/L (ref 135–145)
TCO2: 23 mmol/L (ref 0–100)

## 2015-05-30 NOTE — Discharge Instructions (Signed)
Elson Clan, ophthalmologist expecting in his office. Go directly there. Address provided above. He has phone numbers.

## 2015-05-30 NOTE — ED Notes (Signed)
Pt reports had just finished having sex this morning and started seeing black spots in left eye.  Reports history of blood vessel rupturing behind left eye a few years ago and had the same symptoms.  Reports required surgery last time.

## 2015-05-30 NOTE — ED Notes (Signed)
Patient advised to go directly to opthamologist's office in [place]. Given address and phone number. Gave verbal understanding.

## 2015-05-30 NOTE — Progress Notes (Signed)
Cardiology Office Note   Date:  05/30/2015   ID:  DOVBER SHALL, DOB 1970/08/14, MRN [id number]  PCP:  Louie Boston, MD  Cardiologist: Arlington Calix, NP    ERROR NO SHOW

## 2015-05-30 NOTE — ED Notes (Signed)
States floaters that started today after sexual activity. Denies eye pain. Visual acuity done. Slit lamp at bedside for MD use.

## 2015-05-30 NOTE — ED Provider Notes (Signed)
CSN: [id number]     Arrival date & time 05/30/15  1221 History   First MD Initiated Contact with Patient 05/30/15 1313     Chief Complaint  Patient presents with  . Eye Problem     (Consider location/radiation/quality/duration/timing/severity/associated sxs/prior Treatment) Patient is a 45 y.o. male presenting with eye problem. The history is provided by the patient and the spouse.  Eye Problem Associated symptoms: no headaches, no nausea and no vomiting    patient is a dialysis patient normally dialyzed Tuesdays Thursdays and Saturdays. Patient was dialyzed yesterday. Patient this morning during sexual intercourse at about 9 in the morning with acute onset of seeing black spots in the left eye. Patient states now that the vision in the left eye is DM. Patient had this happen back in the 90s related to a blood vessel rupture behind the left eye. Patient without a headache without eye pain without nausea or vomiting. Patient was dialyzed yesterday. Patient with a history of diabetes and coronary artery disease and peripheral vascular disease. Patient states that hadn't been for the change in vision he would not be here today no other main concerns. Patient has some baseline chest pain and shortness of breath at all times.  Past Medical History  Diagnosis Date  . Essential hypertension, benign   . Mixed hyperlipidemia   . Coronary atherosclerosis of native coronary artery     100% apical LAD and distal OM1, Rx medically - 11/13  . Ischemic cardiomyopathy     LVEF 40-45%  . RBBB   . PAD (peripheral artery disease) (HCC)     Normal ABIs, 2011; focal left EIA stenosis; mild bilateral distal SFA disease, 2008  . CHF (congestive heart failure) (HCC)   . Anginal pain (HCC)   . History of blood transfusion 1988    "arm went thru glass window" (10/12/2012)  . Gouty arthritis   . Depression   . NSTEMI (non-ST elevated myocardial infarction) (HCC) 02/2012 and 10/2012  . Shortness of breath      when he has fluid he gets sob  . Type 2 diabetes mellitus (HCC)     diet controlled  . CKD (chronic kidney disease) stage 3, GFR 30-59 ml/min     on dialysis M/W/F - started March 2016  . Anemia    Past Surgical History  Procedure Laterality Date  . Arm surgery Right [id number]    "@ least 3 ORs; took nerves out of my left leg and put them in my arm" (10/12/2012)  . Cardiac catheterization  03/28/12    20% prox and mid LAD, 100% distal LAD, 30% prox OM, 100% distal OM, 40% prox RCA, 40% mid RCA, 25% distal RCA, small PDA w/ 80% diffuse dz, PLB small w/ 50% diffuse stenoses. No focal lesions for PCI. Recommendations for continued medical management and aggressive RF reduction  . Refractive surgery Bilateral ~ 2005  . Eye surgery Left 1990's    "blood vessel ruptured" (10/12/2012)  . Colonoscopy    . Femoral-popliteal bypass graft Right 07/17/2013    Procedure: BYPASS GRAFT FEMORAL-POPLITEAL ARTERY-RIGHT;  Surgeon: Sherren Kerns, MD;  Location: [facility] OR;  Service: Vascular;  Laterality: Right;  . Endarterectomy femoral Right 07/17/2013    Procedure: ENDARTERECTOMY FEMORAL WITH PROFUNDAPLOASTY;  Surgeon: Sherren Kerns, MD;  Location: [facility] OR;  Service: Vascular;  Laterality: Right;  . Patch angioplasty Right 07/17/2013    Procedure: PATCH ANGIOPLASTY OF COMMON FEMORAL AND PROFUNDA USING SEGMENT OF GREATER SAPHENOUS VEIN ;  Surgeon: Sherren Kerns, MD;  Location: [facility] OR;  Service: Vascular;  Laterality: Right;  . Av fistula placement Left 10/16/2013    Procedure: CREATION OF LEFT ARM BASILIC TO BRACHIAL FISTULA;  Surgeon: Sherren Kerns, MD;  Location: [facility] OR;  Service: Vascular;  Laterality: Left;  . Left heart catheterization with coronary angiogram N/A 03/28/2012    Procedure: LEFT HEART CATHETERIZATION WITH CORONARY ANGIOGRAM;  Surgeon: Kathleene Hazel, MD;  Location: [facility] CATH LAB;  Service: Cardiovascular;  Laterality: N/A;  . Lower extremity angiogram Right 07/11/2013    Procedure: LOWER  EXTREMITY ANGIOGRAM;  Surgeon: Fransisco Hertz, MD;  Location: [facility] CATH LAB;  Service: Cardiovascular;  Laterality: Right;  rt leg angio  . Bascilic vein transposition Left 08/13/2014    Procedure: Left arm SECOND STAGE BASILIC VEIN TRANSPOSITION;  Surgeon: Sherren Kerns, MD;  Location: [facility] OR;  Service: Vascular;  Laterality: Left;  . Peripheral vascular catheterization N/A 02/28/2015    Procedure: Fistulagram;  Surgeon: Sherren Kerns, MD;  Location: [facility] INVASIVE CV LAB;  Service: Cardiovascular;  Laterality: N/A;  . Peripheral vascular catheterization Left 02/28/2015    Procedure: A/V Shunt Intervention;  Surgeon: Sherren Kerns, MD;  Location: [facility] INVASIVE CV LAB;  Service: Cardiovascular;  Laterality: Left;   Family History  Problem Relation Age of Onset  . Heart attack Father 25  . Hypertension Father   . Hypertension Mother   . Cancer Mother   . Lupus Mother    Social History  Substance Use Topics  . Smoking status: Former Smoker -- 0.50 packs/day for 25 years    Types: Cigarettes    Start date: 07/23/1981    Quit date: 07/11/2013  . Smokeless tobacco: Never Used  . Alcohol Use: No     Comment: 10/12/2012 "quit drinking > 10 yr ago; was an alcoholic"    Review of Systems  Constitutional: Negative for fever.  HENT: Negative for congestion.   Eyes: Positive for visual disturbance. Negative for pain.  Respiratory: Positive for shortness of breath.   Cardiovascular: Positive for chest pain.  Gastrointestinal: Negative for nausea, vomiting and abdominal pain.  Musculoskeletal: Negative for neck pain.  Neurological: Negative for headaches.  Hematological: Bruises/bleeds easily.  Psychiatric/Behavioral: Negative for confusion.      Allergies  Vytorin; Gabapentin; and Oxycodone  Home Medications   Prior to Admission medications   Medication Sig Start Date End Date Taking? Authorizing Provider  albuterol (PROVENTIL HFA;VENTOLIN HFA) 108 (90 BASE) MCG/ACT inhaler Inhale 2  puffs into the lungs every 6 (six) hours as needed for wheezing or shortness of breath. 04/14/15   Antoine Poche, MD  amLODipine (NORVASC) 10 MG tablet Take 1 tablet (10 mg total) by mouth daily. 03/27/14   Antoine Poche, MD  aspirin 81 MG chewable tablet Chew 81 mg by mouth daily.    Historical Provider, MD  b complex-vitamin c-folic acid (NEPHRO-VITE) 0.8 MG TABS tablet Take 1 tablet by mouth at bedtime.    Historical Provider, MD  calcium acetate (PHOSLO) 667 MG capsule Take 2-4 capsules by mouth 3 (three) times daily. And with snacks 02/17/15   Historical Provider, MD  carvedilol (COREG) 25 MG tablet Take 1 tablet (25 mg total) by mouth 2 (two) times daily. 06/21/14   Antoine Poche, MD  colchicine 0.6 MG tablet Take 0.6 mg by mouth 2 (two) times daily as needed (gout).     Historical Provider, MD  diphenhydramine-acetaminophen (TYLENOL PM) 25-500 MG TABS Take 1-2  tablets by mouth at bedtime as needed (for pain and sleep).    Historical Provider, MD  docusate sodium (COLACE) 100 MG capsule Take 3 mg by mouth 2 (two) times daily as needed for mild constipation.     Historical Provider, MD  furosemide (LASIX) 40 MG tablet Take 120 mg by mouth daily. 02/25/15   Historical Provider, MD  lidocaine-prilocaine (EMLA) cream Apply 1 application topically as needed. For port access 01/27/15   Historical Provider, MD  lovastatin (MEVACOR) 40 MG tablet Take 40 mg by mouth at bedtime.    Historical Provider, MD  nitroGLYCERIN (NITROSTAT) 0.4 MG SL tablet Place 1 tablet (0.4 mg total) under the tongue every 5 (five) minutes as needed for chest pain. 07/16/14   Antoine Poche, MD   BP 146/82 mmHg  Pulse 80  Temp(Src) 98 F (36.7 C) (Oral)  Resp 16  Ht  (1.753 m)  Wt 102.059 kg  BMI 33.21 kg/m2  SpO2 97% Physical Exam  Constitutional: He is oriented to person, place, and time. He appears well-developed and well-nourished. No distress.  HENT:  Head: Normocephalic and atraumatic.  Eyes:  Conjunctivae and EOM are normal. Pupils are equal, round, and reactive to light.  Right retina appears normal. Left retina with a graying. Decreased light sensation on the left.  Neck: Normal range of motion. Neck supple.  Cardiovascular: Normal rate, regular rhythm and normal heart sounds.   Pulmonary/Chest: Effort normal and breath sounds normal. No respiratory distress.  Abdominal: Soft. Bowel sounds are normal. There is no tenderness.  Neurological: He is alert and oriented to person, place, and time. He exhibits normal muscle tone. Coordination normal.  Decreased light sensation on the left. Patient states vision is dim.  Nursing note and vitals reviewed.   ED Course  Procedures (including critical care time) Labs Review Labs Reviewed  CBC WITH DIFFERENTIAL/PLATELET  COMPREHENSIVE METABOLIC PANEL  I-STAT CHEM 8, ED    Imaging Review No results found. I have personally reviewed and evaluated these images and lab results as part of my medical decision-making.   EKG Interpretation   Date/Time:  Friday May 30 2015 13:43:33 EST Ventricular Rate:  73 PR Interval:  141 QRS Duration: 168 QT Interval:  447 QTC Calculation: 493 R Axis:   94 Text Interpretation:  Sinus rhythm Ventricular premature complex RBBB and  LPFB Baseline wander in lead(s) V5 No significant change since last  tracing Confirmed by Skyelynn Rambeau  MD, Kaylor Simenson [phone]) on 05/30/2015 1:47:15 PM      MDM   Final diagnoses:  Retinal hemorrhage, left   Patient's left eye with a graying of the light reflex of clinically concerning for retinal hemorrhage. Ophthalmologist agrees once see him in the office. Discuss with Dr.Saha, who will see him in the office this afternoon. Patient's wife will BLD drive him there. Patient discharged. No other acute complaints.  Vanetta Mulders, MD 05/30/15 [phone]

## 2015-06-02 ENCOUNTER — Telehealth: Payer: Self-pay | Admitting: *Deleted

## 2015-06-02 NOTE — Telephone Encounter (Signed)
Pt c/o SOB since Friday and has been worsening throughout the weekend. Pt missed last appt when he went to ED. Added pt to Dr. Caesar Chestnut schedule for tomorrow with Dr. Wyline Mood out of office. Told pt if SOB worsened or new symptoms presented to report to ED. Pt voices understanding.

## 2015-06-03 ENCOUNTER — Ambulatory Visit (INDEPENDENT_AMBULATORY_CARE_PROVIDER_SITE_OTHER): Payer: BLUE CROSS/BLUE SHIELD | Admitting: Cardiovascular Disease

## 2015-06-03 ENCOUNTER — Encounter: Payer: Self-pay | Admitting: Vascular Surgery

## 2015-06-03 ENCOUNTER — Encounter: Payer: Self-pay | Admitting: Cardiovascular Disease

## 2015-06-03 VITALS — BP 170/82 | HR 79 | Ht 69.0 in | Wt 230.0 lb

## 2015-06-03 DIAGNOSIS — R0602 Shortness of breath: Secondary | ICD-10-CM

## 2015-06-03 DIAGNOSIS — N186 End stage renal disease: Secondary | ICD-10-CM

## 2015-06-03 DIAGNOSIS — I10 Essential (primary) hypertension: Secondary | ICD-10-CM

## 2015-06-03 DIAGNOSIS — I739 Peripheral vascular disease, unspecified: Secondary | ICD-10-CM

## 2015-06-03 DIAGNOSIS — R609 Edema, unspecified: Secondary | ICD-10-CM

## 2015-06-03 DIAGNOSIS — I25118 Atherosclerotic heart disease of native coronary artery with other forms of angina pectoris: Secondary | ICD-10-CM

## 2015-06-03 DIAGNOSIS — I5022 Chronic systolic (congestive) heart failure: Secondary | ICD-10-CM

## 2015-06-03 DIAGNOSIS — Z720 Tobacco use: Secondary | ICD-10-CM | POA: Diagnosis not present

## 2015-06-03 DIAGNOSIS — R252 Cramp and spasm: Secondary | ICD-10-CM

## 2015-06-03 DIAGNOSIS — R0789 Other chest pain: Secondary | ICD-10-CM

## 2015-06-03 DIAGNOSIS — E785 Hyperlipidemia, unspecified: Secondary | ICD-10-CM

## 2015-06-03 MED ORDER — METOLAZONE 5 MG PO TABS: 5.0000 mg | ORAL_TABLET | Freq: Every day | ORAL | Status: DC

## 2015-06-03 MED ORDER — ISOSORBIDE MONONITRATE ER 30 MG PO TB24: 30.0000 mg | ORAL_TABLET | Freq: Two times a day (BID) | ORAL | Status: DC

## 2015-06-03 NOTE — Progress Notes (Signed)
Patient ID: [name], male   DOB: 08-23-1970, 45 y.o.   MRN: [id number]      SUBJECTIVE: The patient is a 45 yr old male who normally sees Dr. Wyline Mood. He has a h/o CAD with ischemic cardiomyopathy.  He had been experiencing chest pain in early December occurring primarily with exertion but also at rest.  He is also being evaluated for a kidney transplant.  He underwent coronary angiography at [facility] on 04/17/15 which demonstrated a chronically occluded RCA and diffusely diseased distal LAD and distal left circumflex. A medium-sized first diagonal was also diffusely diseased and there were no regions where PCI appeared beneficial , with notes stating that there were similar findings at his cardiac catheterization in 2013.  Echocardiogram on 03/16/15 demonstrated mildly reduced left reticular systolic function, EF 40-45%, mild LVH, diastolic dysfunction, wall motion abnormalities , and mild mitral regurgitation.  Hemodialysis was recently attempted but had to be stopped due to diffuse muscle cramping primarily of his abdomen and legs. Has been short of breath waking him up at least 5 times per night. He is currently taking Imdur 30 mg daily and Coreg 37.5 mg twice daily. Labs on 1/20 hemoglobin 10, BUN 71, creatinine 10.6.  Says fluid buildup primarily in abdomen and some in legs.     Review of Systems: As per "subjective", otherwise negative.  Allergies  Allergen Reactions  . Vytorin [Ezetimibe-Simvastatin]     Weakness, muscle aches bad.  . Gabapentin Other (See Comments)    Twitching  . Oxycodone Nausea And Vomiting    Current Outpatient Prescriptions  Medication Sig Dispense Refill  . acetaminophen (TYLENOL) 500 MG tablet Take 500 mg by mouth every 6 (six) hours as needed.    Marland Kitchen albuterol (PROVENTIL HFA;VENTOLIN HFA) 108 (90 BASE) MCG/ACT inhaler Inhale 2 puffs into the lungs every 6 (six) hours as needed for wheezing or shortness of breath. 1 Inhaler 2  . amLODipine  (NORVASC) 10 MG tablet Take 1 tablet (10 mg total) by mouth daily. 90 tablet 3  . aspirin 81 MG chewable tablet Chew 81 mg by mouth daily.    . calcium acetate (PHOSLO) 667 MG capsule Take 2-4 capsules by mouth 3 (three) times daily. And with snacks  4  . carvedilol (COREG) 25 MG tablet Take 37.5 mg by mouth 2 (two) times daily with a meal.    . colchicine 0.6 MG tablet Take 0.6 mg by mouth 2 (two) times daily as needed (gout).     Marland Kitchen diphenhydramine-acetaminophen (TYLENOL PM) 25-500 MG TABS Take 1-2 tablets by mouth at bedtime as needed (for pain and sleep).    . docusate sodium (COLACE) 100 MG capsule Take 3 mg by mouth 2 (two) times daily as needed for mild constipation.     . furosemide (LASIX) 40 MG tablet Take 120 mg by mouth daily.  2  . isosorbide mononitrate (IMDUR) 30 MG 24 hr tablet Take 30 mg by mouth daily.    Marland Kitchen lidocaine-prilocaine (EMLA) cream Apply 1 application topically as needed. For port access  10  . lovastatin (MEVACOR) 40 MG tablet Take 40 mg by mouth at bedtime.    . methocarbamol (ROBAXIN) 500 MG tablet Take 500 mg by mouth 3 (three) times daily.    . nitroGLYCERIN (NITROSTAT) 0.4 MG SL tablet Place 1 tablet (0.4 mg total) under the tongue every 5 (five) minutes as needed for chest pain. 25 tablet 3   No current facility-administered medications for this visit.  Past Medical History  Diagnosis Date  . Essential hypertension, benign   . Mixed hyperlipidemia   . Coronary atherosclerosis of native coronary artery     100% apical LAD and distal OM1, Rx medically - 11/13  . Ischemic cardiomyopathy     LVEF 40-45%  . RBBB   . PAD (peripheral artery disease) (HCC)     Normal ABIs, 2011; focal left EIA stenosis; mild bilateral distal SFA disease, 2008  . CHF (congestive heart failure) (HCC)   . Anginal pain (HCC)   . History of blood transfusion 1988    "arm went thru glass window" (10/12/2012)  . Gouty arthritis   . Depression   . NSTEMI (non-ST elevated myocardial  infarction) (HCC) 02/2012 and 10/2012  . Shortness of breath     when he has fluid he gets sob  . Type 2 diabetes mellitus (HCC)     diet controlled  . CKD (chronic kidney disease) stage 3, GFR 30-59 ml/min     on dialysis M/W/F - started March 2016  . Anemia     Past Surgical History  Procedure Laterality Date  . Arm surgery Right [phone]    "@ least 3 ORs; took nerves out of my left leg and put them in my arm" (10/12/2012)  . Cardiac catheterization  03/28/12    20% prox and mid LAD, 100% distal LAD, 30% prox OM, 100% distal OM, 40% prox RCA, 40% mid RCA, 25% distal RCA, small PDA w/ 80% diffuse dz, PLB small w/ 50% diffuse stenoses. No focal lesions for PCI. Recommendations for continued medical management and aggressive RF reduction  . Refractive surgery Bilateral ~ 2005  . Eye surgery Left 1990's    "blood vessel ruptured" (10/12/2012)  . Colonoscopy    . Femoral-popliteal bypass graft Right 07/17/2013    Procedure: BYPASS GRAFT FEMORAL-POPLITEAL ARTERY-RIGHT;  Surgeon: Sherren Kerns, MD;  Location: [facility] OR;  Service: Vascular;  Laterality: Right;  . Endarterectomy femoral Right 07/17/2013    Procedure: ENDARTERECTOMY FEMORAL WITH PROFUNDAPLOASTY;  Surgeon: Sherren Kerns, MD;  Location: [facility] OR;  Service: Vascular;  Laterality: Right;  . Patch angioplasty Right 07/17/2013    Procedure: PATCH ANGIOPLASTY OF COMMON FEMORAL AND PROFUNDA USING SEGMENT OF GREATER SAPHENOUS VEIN ;  Surgeon: Sherren Kerns, MD;  Location: [facility] OR;  Service: Vascular;  Laterality: Right;  . Av fistula placement Left 10/16/2013    Procedure: CREATION OF LEFT ARM BASILIC TO BRACHIAL FISTULA;  Surgeon: Sherren Kerns, MD;  Location: [facility] OR;  Service: Vascular;  Laterality: Left;  . Left heart catheterization with coronary angiogram N/A 03/28/2012    Procedure: LEFT HEART CATHETERIZATION WITH CORONARY ANGIOGRAM;  Surgeon: Kathleene Hazel, MD;  Location: [facility] CATH LAB;  Service: Cardiovascular;  Laterality:  N/A;  . Lower extremity angiogram Right 07/11/2013    Procedure: LOWER EXTREMITY ANGIOGRAM;  Surgeon: Fransisco Hertz, MD;  Location: [facility] CATH LAB;  Service: Cardiovascular;  Laterality: Right;  rt leg angio  . Bascilic vein transposition Left 08/13/2014    Procedure: Left arm SECOND STAGE BASILIC VEIN TRANSPOSITION;  Surgeon: Sherren Kerns, MD;  Location: [facility] OR;  Service: Vascular;  Laterality: Left;  . Peripheral vascular catheterization N/A 02/28/2015    Procedure: Fistulagram;  Surgeon: Sherren Kerns, MD;  Location: [facility] INVASIVE CV LAB;  Service: Cardiovascular;  Laterality: N/A;  . Peripheral vascular catheterization Left 02/28/2015    Procedure: A/V Shunt Intervention;  Surgeon: Sherren Kerns, MD;  Location: [facility] INVASIVE  CV LAB;  Service: Cardiovascular;  Laterality: Left;    Social History   Social History  . Marital Status: Married    Spouse Name: N/A  . Number of Children: N/A  . Years of Education: N/A   Occupational History  . Not on file.   Social History Main Topics  . Smoking status: Former Smoker -- 0.50 packs/day for 25 years    Types: Cigarettes    Start date: 07/23/1981    Quit date: 07/11/2013  . Smokeless tobacco: Never Used  . Alcohol Use: No     Comment: 10/12/2012 "quit drinking > 10 yr ago; was an alcoholic"  . Drug Use: Yes    Special: Cocaine, "Crack" cocaine, Marijuana     Comment: 10/12/2012 "it's been 14-15 years since I've used cocaine"  . Sexual Activity: Yes   Other Topics Concern  . Not on file   Social History Narrative     Filed Vitals:   06/03/15 1304  BP: 170/82  Pulse: 79  Height: 5\' 9"  (1.753 m)  Weight: 230 lb (104.327 kg)  SpO2: 92%    PHYSICAL EXAM General: NAD HEENT: Normal. Neck: No JVD, no thyromegaly. Lungs: Clear to auscultation bilaterally with normal respiratory effort. CV: Regular rate and rhythm, normal S1/S2, no S3/S4, no murmur. Trace pretibial and periankle edema.   Abdomen: Firm, obese, mild distention.    Neurologic: Alert and oriented x 3.  Psych: Normal affect. Skin: Normal. Musculoskeletal: No gross deformities. Extremities: No clubbing or cyanosis.   ECG: Most recent ECG reviewed.      ASSESSMENT AND PLAN: 1. Shortness of breath with CAD: Likely due to fluid retention because of inability to complete dialysis, in context of severe distal CAD. Will increase Imdur to 30 mg bid.  Continue aspirin, amlodipine , carvedilol, and lovastatin.  2. Shortness of breath with chronic systolic heart failure: Fluid retention due to inability to complete dialysis in context of ESRD. Will attempt metolazone 5 mg daily for next four days and I have instructed him to call office this Friday to report if he has had any symptom relief.  3. Muscle cramps: Likely due to stage 5 CKD. Will check serum magnesium.  4. Essential HTN: Elevated. Monitor given increase in Imdur dose. May need hydralazine.  5. Hyperlipidemia: Continue lovastatin.  6. PAD: Followed by vascular. On ASA and statin.  Dispo: f/u next week with Dr. Wyline Mood.  Time spent: 40 minutes, of which greater than 50% was spent reviewing symptoms, relevant blood tests and studies, and discussing management plan with the patient.    Prentice Docker, M.D., F.A.C.C.

## 2015-06-03 NOTE — Patient Instructions (Addendum)
   Increase Imdur to 30mg  twice a day    Begin Metolazone 5mg  daily (beginning today) thru Friday only.  Afterwards, only as directed by MD.  Prescriptions above sent to [facility] today. Continue all other medications.   Lab for Magnesium - order given today.  Office will contact with results via phone or letter.   Follow up in  1 week

## 2015-06-03 NOTE — Addendum Note (Signed)
Addended by: Lesle Chris on: 06/03/2015 01:49 PM   Modules accepted: Orders

## 2015-06-04 ENCOUNTER — Other Ambulatory Visit: Payer: Self-pay | Admitting: Cardiovascular Disease

## 2015-06-04 LAB — MAGNESIUM: MAGNESIUM: 2.1 mg/dL (ref 1.5–2.5)

## 2015-06-06 ENCOUNTER — Ambulatory Visit (INDEPENDENT_AMBULATORY_CARE_PROVIDER_SITE_OTHER)
Admission: RE | Admit: 2015-06-06 | Discharge: 2015-06-06 | Disposition: A | Discharge status: Home / Self Care | Payer: Medicare Other | Source: Ambulatory Visit | Attending: Vascular Surgery | Admitting: Vascular Surgery

## 2015-06-06 ENCOUNTER — Ambulatory Visit (HOSPITAL_COMMUNITY)
Admission: RE | Admit: 2015-06-06 | Discharge: 2015-06-06 | Disposition: A | Discharge status: Home / Self Care | Payer: Medicare Other | Source: Ambulatory Visit | Attending: Vascular Surgery | Admitting: Vascular Surgery

## 2015-06-06 DIAGNOSIS — I10 Essential (primary) hypertension: Secondary | ICD-10-CM | POA: Diagnosis not present

## 2015-06-06 DIAGNOSIS — R252 Cramp and spasm: Secondary | ICD-10-CM | POA: Diagnosis not present

## 2015-06-06 DIAGNOSIS — Z95828 Presence of other vascular implants and grafts: Secondary | ICD-10-CM

## 2015-06-06 DIAGNOSIS — E119 Type 2 diabetes mellitus without complications: Secondary | ICD-10-CM | POA: Insufficient documentation

## 2015-06-06 DIAGNOSIS — E785 Hyperlipidemia, unspecified: Secondary | ICD-10-CM | POA: Insufficient documentation

## 2015-06-06 DIAGNOSIS — I739 Peripheral vascular disease, unspecified: Secondary | ICD-10-CM

## 2015-06-06 DIAGNOSIS — Z87891 Personal history of nicotine dependence: Secondary | ICD-10-CM | POA: Insufficient documentation

## 2015-06-11 ENCOUNTER — Encounter: Payer: Self-pay | Admitting: Physician Assistant

## 2015-06-11 ENCOUNTER — Encounter: Payer: Medicare Other | Admitting: Physician Assistant

## 2015-06-11 NOTE — Progress Notes (Signed)
No show

## 2015-06-12 ENCOUNTER — Ambulatory Visit (INDEPENDENT_AMBULATORY_CARE_PROVIDER_SITE_OTHER): Payer: BLUE CROSS/BLUE SHIELD | Admitting: Vascular Surgery

## 2015-06-12 ENCOUNTER — Encounter: Payer: Self-pay | Admitting: Vascular Surgery

## 2015-06-12 VITALS — BP 165/70 | HR 80 | Temp 98.4°F | Resp 16 | Ht 69.0 in | Wt 227.0 lb

## 2015-06-12 DIAGNOSIS — Z992 Dependence on renal dialysis: Secondary | ICD-10-CM

## 2015-06-12 DIAGNOSIS — N186 End stage renal disease: Secondary | ICD-10-CM

## 2015-06-12 NOTE — Progress Notes (Signed)
Filed Vitals:   06/12/15 1505 06/12/15 1513  BP: 165/76 165/70  Pulse: 82 80  Temp: 98.4 F (36.9 C)   TempSrc: Oral   Resp: 16   Height: 5\' 9"  (1.753 m)   Weight: 227 lb (102.967 kg)   SpO2: 100%

## 2015-06-12 NOTE — Progress Notes (Signed)
  Patient is a 45 year old male who returns for follow-up today after recent second stage basilic vein transposition fistula. This was done on 08/13/2014. He previously had a right femoral to below-knee popliteal bypass with propaten March 2015. He denies any symptoms of steal from his fistula. He denies any claudication symptoms. He has not had any problems with nonhealing wounds. His primary complaint today is the fact that he gets essentially total body cramping while on dialysis. This is not confined to the left arm or the right leg. It is both legs left neck and left arm abdomen essentially total body. It only occurs with dialysis.  Physical exam:  Filed Vitals:   06/12/15 1505 06/12/15 1513  BP: 165/76 165/70  Pulse: 82 80  Temp: 98.4 F (36.9 C)   TempSrc: Oral   Resp: 16   Height: 5\' 9"  (1.753 m)   Weight: 227 lb (102.967 kg)   SpO2: 100%    Left upper extremity: Palpable thrill in fistula no edema left upper extremity and left hand well-perfused  Lower extremities, no palpable pedal pulses well-healed incisions both feet well-perfused  Data: Patient had a graft duplex exam of the right leg today. This showed a widely patent bypass graft with no areas of narrowing. ABI was 1.80 with triphasic waveforms. Left ABI was 1.02 with triphasic waveforms.  Assessment: Body cramps associated with dialysis. No vascular etiology for his current symptoms.  Plan: The patient wished to try a course of Neurontin for his current symptoms. I also told him to discuss his current dialysis therapy with Dr. Fausto Skillern. I do not know if there are any changes that could be made to his current treatments but certainly this seems to be really able to his dialysis therapy.  Previously the patient experienced twitching type symptoms from Neurontin. However, this was before he went on dialysis and had marginal renal function. I believe it is worth a trial of Neurontin to see if this alleviates his symptoms. If he  develops twitching or no relief of his symptoms within 4-6 weeks. He will discontinue this.  He was given a prescription for 300 mg once daily. He will also take an additional 300 mg post dialysis.   He will follow up with me in 12 months for bilateral ABIs and graft duplex of his legs.  Fabienne Bruns, MD [facility] Office: [phone] Pager: [phone]

## 2015-06-16 ENCOUNTER — Telehealth: Payer: Self-pay | Admitting: *Deleted

## 2015-06-16 NOTE — Telephone Encounter (Signed)
Notes Recorded by Lesle Chris, LPN on 10/09/6071 at 11:27 AM Patient notified, copy fwd to pmd.  Notes Recorded by Laqueta Linden, MD on 06/05/2015 at 11:49 AM Normal.

## 2015-06-17 ENCOUNTER — Ambulatory Visit (INDEPENDENT_AMBULATORY_CARE_PROVIDER_SITE_OTHER): Payer: BLUE CROSS/BLUE SHIELD | Admitting: Cardiology

## 2015-06-17 ENCOUNTER — Ambulatory Visit: Payer: Medicare Other | Admitting: Cardiology

## 2015-06-17 ENCOUNTER — Encounter: Payer: Self-pay | Admitting: Cardiology

## 2015-06-17 VITALS — BP 153/83 | HR 89 | Ht 69.0 in | Wt 228.0 lb

## 2015-06-17 DIAGNOSIS — I5022 Chronic systolic (congestive) heart failure: Secondary | ICD-10-CM | POA: Diagnosis not present

## 2015-06-17 DIAGNOSIS — R0602 Shortness of breath: Secondary | ICD-10-CM

## 2015-06-17 DIAGNOSIS — E785 Hyperlipidemia, unspecified: Secondary | ICD-10-CM

## 2015-06-17 DIAGNOSIS — I10 Essential (primary) hypertension: Secondary | ICD-10-CM | POA: Diagnosis not present

## 2015-06-17 MED ORDER — METOLAZONE 2.5 MG PO TABS: ORAL_TABLET | ORAL | Status: DC

## 2015-06-17 MED ORDER — ATORVASTATIN CALCIUM 80 MG PO TABS: 80.0000 mg | ORAL_TABLET | Freq: Every day | ORAL | Status: DC

## 2015-06-17 NOTE — Progress Notes (Signed)
Patient ID: NEDAL SABORI, male   DOB: 04-29-1971, 45 y.o.   MRN: [id number]     Clinical Summary Mr. Mccants is a 45 y.o.male seen today for follow up of the following medical problems.   1. CAD/ICM  -cath 03/2012 severe distal disease too small for intervention, multiple mild to moderate lesions. Managed medically echo 11/2013 LVEF 50-55%, grade II diastolic dysfunction, inferior hypokinesis.  - 02/2014 Lexiscan MPI inferior scar without ischemia, LVEF 33%.  - Over the last few visits he has been having some chest pain symptoms.  - aching/sharp pain with some SOB, left chest. 7/10. Could occur at rest or exertion, but more with exertion. Could be diaphoretic at time. Better with movement. Could be brought on by eating or drinking. Baby ASA made, rubbing could make better. Pain could last for hours consistently.  - symptoms did not improve with increasing his coreg. Tolerating imdur 30mg  bid, previously had caused headaches.   - recent DSE at [facility] showed decreased LVEF with stress. He later had a cath which showed RCA CTO, chronic distal LAD and LCX disease overall unchanged. Managed medically - no significant chest pain since last visit   2. HL  - on lovastatin due to costs, compliant - 04/2015 TC 128 TG 170 HDL 28 LDL 78  3. HTN  - compliant with meds - does not check regularly at home  4. PAD  - followed by vascular Dr Darrick Penna  - right femoropopiliteal bypass in March 2015.   5. SOB - seen by Dr Purvis Sheffield in late January due to SOB. Patient had not been able to tolerate hemodialysis due to muscle cramps (normal K and Mg by labs)and was accumulating fluid. He was started on metolazone 5mg  daily x 4 days. Swelling and SOB did improve.   6. ESRD - can tolerate only 2 hours of dialysis. HD T,TH,Sat - breathing improved with metolazone.  - reports his dry 101 kg.  - reports not eligible for transplant due to his CAD.      Past Medical History  Diagnosis Date  .  Essential hypertension, benign   . Mixed hyperlipidemia   . Coronary atherosclerosis of native coronary artery     100% apical LAD and distal OM1, Rx medically - 11/13  . Ischemic cardiomyopathy     LVEF 40-45%  . RBBB   . PAD (peripheral artery disease) (HCC)     Normal ABIs, 2011; focal left EIA stenosis; mild bilateral distal SFA disease, 2008  . CHF (congestive heart failure) (HCC)   . Anginal pain (HCC)   . History of blood transfusion 1988    "arm went thru glass window" (10/12/2012)  . Gouty arthritis   . Depression   . NSTEMI (non-ST elevated myocardial infarction) (HCC) 02/2012 and 10/2012  . Shortness of breath     when he has fluid he gets sob  . Type 2 diabetes mellitus (HCC)     diet controlled  . CKD (chronic kidney disease) stage 3, GFR 30-59 ml/min     on dialysis M/W/F - started March 2016  . Anemia      Allergies  Allergen Reactions  . Vytorin [Ezetimibe-Simvastatin]     Weakness, muscle aches bad.  . Gabapentin Other (See Comments)    Twitching  . Oxycodone Nausea And Vomiting     Current Outpatient Prescriptions  Medication Sig Dispense Refill  . acetaminophen (TYLENOL) 500 MG tablet Take 500 mg by mouth every 6 (six) hours as needed.    Marland Kitchen  albuterol (PROVENTIL HFA;VENTOLIN HFA) 108 (90 BASE) MCG/ACT inhaler Inhale 2 puffs into the lungs every 6 (six) hours as needed for wheezing or shortness of breath. 1 Inhaler 2  . amLODipine (NORVASC) 10 MG tablet Take 1 tablet (10 mg total) by mouth daily. 90 tablet 3  . aspirin 81 MG chewable tablet Chew 81 mg by mouth daily.    . calcium acetate (PHOSLO) 667 MG capsule Take 2-4 capsules by mouth 3 (three) times daily. And with snacks  4  . carvedilol (COREG) 25 MG tablet Take 37.5 mg by mouth 2 (two) times daily with a meal.    . colchicine 0.6 MG tablet Take 0.6 mg by mouth 2 (two) times daily as needed (gout).     Marland Kitchen diphenhydramine-acetaminophen (TYLENOL PM) 25-500 MG TABS Take 1-2 tablets by mouth at bedtime as  needed (for pain and sleep).    . docusate sodium (COLACE) 100 MG capsule Take 3 mg by mouth 2 (two) times daily as needed for mild constipation.     . furosemide (LASIX) 40 MG tablet Take 120 mg by mouth daily.  2  . isosorbide mononitrate (IMDUR) 30 MG 24 hr tablet Take 1 tablet (30 mg total) by mouth 2 (two) times daily. 60 tablet 6  . lidocaine-prilocaine (EMLA) cream Apply 1 application topically as needed. For port access  10  . lovastatin (MEVACOR) 40 MG tablet Take 40 mg by mouth at bedtime.    . methocarbamol (ROBAXIN) 500 MG tablet Take 500 mg by mouth 3 (three) times daily.    . metolazone (ZAROXOLYN) 5 MG tablet Take 1 tablet (5 mg total) by mouth daily. X 5 days only, then as directed by physician thereafter. 15 tablet 1  . nitroGLYCERIN (NITROSTAT) 0.4 MG SL tablet Place 1 tablet (0.4 mg total) under the tongue every 5 (five) minutes as needed for chest pain. 25 tablet 3   No current facility-administered medications for this visit.     Past Surgical History  Procedure Laterality Date  . Arm surgery Right [phone]    "@ least 3 ORs; took nerves out of my left leg and put them in my arm" (10/12/2012)  . Cardiac catheterization  03/28/12    20% prox and mid LAD, 100% distal LAD, 30% prox OM, 100% distal OM, 40% prox RCA, 40% mid RCA, 25% distal RCA, small PDA w/ 80% diffuse dz, PLB small w/ 50% diffuse stenoses. No focal lesions for PCI. Recommendations for continued medical management and aggressive RF reduction  . Refractive surgery Bilateral ~ 2005  . Eye surgery Left 1990's    "blood vessel ruptured" (10/12/2012)  . Colonoscopy    . Femoral-popliteal bypass graft Right 07/17/2013    Procedure: BYPASS GRAFT FEMORAL-POPLITEAL ARTERY-RIGHT;  Surgeon: Sherren Kerns, MD;  Location: [facility] OR;  Service: Vascular;  Laterality: Right;  . Endarterectomy femoral Right 07/17/2013    Procedure: ENDARTERECTOMY FEMORAL WITH PROFUNDAPLOASTY;  Surgeon: Sherren Kerns, MD;  Location: [facility] OR;   Service: Vascular;  Laterality: Right;  . Patch angioplasty Right 07/17/2013    Procedure: PATCH ANGIOPLASTY OF COMMON FEMORAL AND PROFUNDA USING SEGMENT OF GREATER SAPHENOUS VEIN ;  Surgeon: Sherren Kerns, MD;  Location: [facility] OR;  Service: Vascular;  Laterality: Right;  . Av fistula placement Left 10/16/2013    Procedure: CREATION OF LEFT ARM BASILIC TO BRACHIAL FISTULA;  Surgeon: Sherren Kerns, MD;  Location: [facility] OR;  Service: Vascular;  Laterality: Left;  . Left heart catheterization with coronary angiogram  N/A 03/28/2012    Procedure: LEFT HEART CATHETERIZATION WITH CORONARY ANGIOGRAM;  Surgeon: Kathleene Hazel, MD;  Location: [facility] CATH LAB;  Service: Cardiovascular;  Laterality: N/A;  . Lower extremity angiogram Right 07/11/2013    Procedure: LOWER EXTREMITY ANGIOGRAM;  Surgeon: Fransisco Hertz, MD;  Location: [facility] CATH LAB;  Service: Cardiovascular;  Laterality: Right;  rt leg angio  . Bascilic vein transposition Left 08/13/2014    Procedure: Left arm SECOND STAGE BASILIC VEIN TRANSPOSITION;  Surgeon: Sherren Kerns, MD;  Location: [facility] OR;  Service: Vascular;  Laterality: Left;  . Peripheral vascular catheterization N/A 02/28/2015    Procedure: Fistulagram;  Surgeon: Sherren Kerns, MD;  Location: [facility] INVASIVE CV LAB;  Service: Cardiovascular;  Laterality: N/A;  . Peripheral vascular catheterization Left 02/28/2015    Procedure: A/V Shunt Intervention;  Surgeon: Sherren Kerns, MD;  Location: [facility] INVASIVE CV LAB;  Service: Cardiovascular;  Laterality: Left;     Allergies  Allergen Reactions  . Vytorin [Ezetimibe-Simvastatin]     Weakness, muscle aches bad.  . Gabapentin Other (See Comments)    Twitching  . Oxycodone Nausea And Vomiting      Family History  Problem Relation Age of Onset  . Heart attack Father 24  . Hypertension Father   . Hypertension Mother   . Cancer Mother   . Lupus Mother      Social History Mr. Engel reports that he quit smoking about 23 months ago.  His smoking use included Cigarettes. He started smoking about 33 years ago. He has a 12.5 pack-year smoking history. He has never used smokeless tobacco. Mr. Rison reports that he does not drink alcohol.   Review of Systems CONSTITUTIONAL: No weight loss, fever, chills, weakness or fatigue.  HEENT: Eyes: No visual loss, blurred vision, double vision or yellow sclerae.No hearing loss, sneezing, congestion, runny nose or sore throat.  SKIN: No rash or itching.  CARDIOVASCULAR: per HPI RESPIRATORY: No cough or sputum.  GASTROINTESTINAL: No anorexia, nausea, vomiting or diarrhea. No abdominal pain or blood.  GENITOURINARY: No burning on urination, no polyuria NEUROLOGICAL: No headache, dizziness, syncope, paralysis, ataxia, numbness or tingling in the extremities. No change in bowel or bladder control.  MUSCULOSKELETAL: No muscle, back pain, joint pain or stiffness.  LYMPHATICS: No enlarged nodes. No history of splenectomy.  PSYCHIATRIC: No history of depression or anxiety.  ENDOCRINOLOGIC: No reports of sweating, cold or heat intolerance. No polyuria or polydipsia.  Marland Kitchen   Physical Examination Filed Vitals:   06/17/15 1148  BP: 153/83  Pulse: 89   Filed Vitals:   06/17/15 1148  Height: 5\' 9"  (1.753 m)  Weight: 228 lb (103.42 kg)    Gen: resting comfortably, no acute distress HEENT: no scleral icterus, pupils equal round and reactive, no palptable cervical adenopathy,  CV: RRR, no m/r/g, no jvd Resp: Clear to auscultation bilaterally GI: abdomen is soft, non-tender, non-distended, normal bowel sounds, no hepatosplenomegaly MSK: extremities are warm, no edema.  Skin: warm, no rash Neuro:  no focal deficits Psych: appropriate affect   Diagnostic Studies Cath 03/2012  Hemodynamic Findings:  Central aortic pressure: 150/100  Left ventricular pressure: 148/17/21  Angiographic Findings:  Left main: No obstructive disease.  Left Anterior Descending Artery: Large vessel that  coursed to the apex. The proximal and mid vessel had serial 20% lesions. The distal vessel had 100% occlusion just before the apex. The vessel was 1.75 mm in this location. The diagonal Aydyn Testerman was small in caliber and patent with  mild diffuse disease.  Circumflex Artery: Large caliber vessel with large first obtuse marginal Sarvesh Meddaugh. There is a 30% stenosis in the proximal portion of the obtuse marginal Ahniya Mitchum. The distal segment of the OM Stiven Kaspar is 100% occluded.  Right Coronary Artery: Large, dominant vessel with 40% proximal stenosis, long tubular 40% mid stenosis, serial 25% distal stenoses. The PDA is very small caliber and has diffuse 80% stenosis. The Posterolateral Ayonna Speranza is small in caliber and has diffuse 50% stenosis.  Left Ventricular Angiogram: Deferred.  Impression:  1. Triple vessel CAD with occlusion of the apical LAD and distal portion of the first obtuse marginal Shamina Etheridge. No focal lesions for PCI  2. NSTEMI secondary to above.  Recommendations: Will continue medical management with aggressive risk factor reduction. He will need to stop smoking. We will continue statin, beta blocker. Will continue ASA and will load with Plavix. Will check echo to assess LVEF.  Complications: None. The patient tolerated the procedure well.   03/2012 echo  LVEF 40-45%, mild LVH, multiple WMAs, grade II diastolic dysfunction,   09/2013 Carotid US  Bilateral 1-39% ICA stenosis, patent antegrade vertebrals  02/2014 Lexiscan MPI IMPRESSION: 1. Inferior wall infarct extending from apex to base. No reversible ischemia.  2. Severe inferior wall hypokinesis.  3. Left ventricular ejection fraction 33%  4. High-risk stress test findings based upon extent of myocardial scar and severely reduced left ventricular systolic function.  11/2013 Echo Study Conclusions  - Left ventricle: The cavity size was mildly dilated. Wall thickness was increased in a pattern of mild LVH.  Systolic function was normal. The estimated ejection fraction was approximately 55%. Features are consistent with a pseudonormal left ventricular filling pattern, with concomitant abnormal relaxation and increased filling pressure (grade 2 diastolic dysfunction). Doppler parameters are consistent with high ventricular filling pressure. - Regional wall motion abnormality: Moderate hypokinesis of the basal inferior and mid inferolateral myocardium; mild hypokinesis of the mid inferior, basal-mid anterolateral, and apical lateral myocardium. - Aortic valve: Mildly thickened leaflets. - Mitral valve: Mildly thickened leaflets . There was trivial regurgitation. - Tricuspid valve: There was trivial regurgitation. - Pulmonic valve: There was trivial regurgitation.   04/2015 [facility] Cath (cannot find actual report in Careeverywhere. Results referred to in clinic note) Left Heart Cath done on 12/08 which showed similar disease when compared to [facility] report. Documentation mentioned distal, small disease that is causing his symptoms. No noticeable large defects that could be intervened upon. So he was medically optimized and discharged home on Imdur 15     Assessment and Plan   1. CAD/ICM/Chronic systolic heart failure - no current symptoms, continue current meds.   2. HL  - change to atorvastatin 80mg  daily in setting of known CAD, he now has insurance and should be able to get   3. HTN  - continue current meds , at goal  4. PAD  - follow with vascular   5. ESRD - compliant with HD, cannot tolerate long sessions due to muscle cramps. We will give metolazone 2.5mg  prn, continue lasix 120mg  daily       Antoine Poche, MD

## 2015-06-17 NOTE — Patient Instructions (Signed)
Your physician recommends that you schedule a follow-up appointment in: 3 MONTHS WITH DR. BRANCH  Your physician has recommended you make the following change in your medication:   STOP LOVASTATIN   START ATORVASTATIN 80 MG DAILY  DECREASE METOLAZONE 2.5 MG AS NEEDED FOR WEIGHT OF 220 LBS OR GREATER   Thank you for choosing [facility]!!

## 2015-06-26 ENCOUNTER — Encounter: Payer: Self-pay | Admitting: Cardiology

## 2015-06-30 ENCOUNTER — Ambulatory Visit: Payer: Medicare Other | Admitting: Cardiology

## 2015-07-10 ENCOUNTER — Other Ambulatory Visit: Payer: Self-pay | Admitting: Cardiology

## 2015-07-10 MED ORDER — ISOSORBIDE MONONITRATE ER 30 MG PO TB24: 30.0000 mg | ORAL_TABLET | Freq: Two times a day (BID) | ORAL | Status: DC

## 2015-08-06 ENCOUNTER — Other Ambulatory Visit: Payer: Self-pay | Admitting: *Deleted

## 2015-08-06 MED ORDER — ATORVASTATIN CALCIUM 80 MG PO TABS: 80.0000 mg | ORAL_TABLET | Freq: Every day | ORAL | Status: DC

## 2015-09-25 ENCOUNTER — Other Ambulatory Visit: Payer: Self-pay | Admitting: Cardiology

## 2015-09-29 ENCOUNTER — Ambulatory Visit: Payer: Medicare Other | Admitting: Cardiology

## 2015-09-29 NOTE — Progress Notes (Unsigned)
Patient ID: [name], male   DOB: December 10, 1970, 45 y.o.   MRN: [id number]     Clinical Summary [name] is a 45 y.o.male seen today for follow up of the following medical problems.   1. CAD/ICM  -cath 03/2012 severe distal disease too small for intervention, multiple mild to moderate lesions. Managed medically echo 11/2013 LVEF 50-55%, grade II diastolic dysfunction, inferior hypokinesis.  - 02/2014 Lexiscan MPI inferior scar without ischemia, LVEF 33%.  - Over the last few visits he has been having some chest pain symptoms.  - aching/sharp pain with some SOB, left chest. 7/10. Could occur at rest or exertion, but more with exertion. Could be diaphoretic at time. Better with movement. Could be brought on by eating or drinking. Baby ASA made, rubbing could make better. Pain could last for hours consistently.  - symptoms did not improve with increasing his coreg. Tolerating imdur 30mg  bid, previously had caused headaches.   - recent DSE at [facility] showed decreased LVEF with stress. He later had a cath which showed RCA CTO, chronic distal LAD and LCX disease overall unchanged. Managed medically - no significant chest pain since last visit   2. HL  - on lovastatin due to costs, compliant - 04/2015 TC 128 TG 170 HDL 28 LDL 78  3. HTN  - compliant with meds - does not check regularly at home  4. PAD  - followed by vascular Dr Darrick Penna  - right femoropopiliteal bypass in March 2015.   5. SOB - seen by Dr Purvis Sheffield in late January due to SOB. Patient had not been able to tolerate hemodialysis due to muscle cramps (normal K and Mg by labs)and was accumulating fluid. He was started on metolazone 5mg  daily x 4 days. Swelling and SOB did improve.   6. ESRD - can tolerate only 2 hours of dialysis. HD T,TH,Sat - breathing improved with metolazone.  - reports his dry 101 kg.  - reports not eligible for transplant due to his CAD.  Past Medical History  Diagnosis Date  .  Essential hypertension, benign   . Mixed hyperlipidemia   . Coronary atherosclerosis of native coronary artery     100% apical LAD and distal OM1, Rx medically - 11/13  . Ischemic cardiomyopathy     LVEF 40-45%  . RBBB   . PAD (peripheral artery disease) (HCC)     Normal ABIs, 2011; focal left EIA stenosis; mild bilateral distal SFA disease, 2008  . CHF (congestive heart failure) (HCC)   . Anginal pain (HCC)   . History of blood transfusion 1988    "arm went thru glass window" (10/12/2012)  . Gouty arthritis   . Depression   . NSTEMI (non-ST elevated myocardial infarction) (HCC) 02/2012 and 10/2012  . Shortness of breath     when he has fluid he gets sob  . Type 2 diabetes mellitus (HCC)     diet controlled  . CKD (chronic kidney disease) stage 3, GFR 30-59 ml/min     on dialysis M/W/F - started March 2016  . Anemia      Allergies  Allergen Reactions  . Vytorin [Ezetimibe-Simvastatin]     Weakness, muscle aches bad.  . Gabapentin Other (See Comments)    Twitching  . Oxycodone Nausea And Vomiting     Current Outpatient Prescriptions  Medication Sig Dispense Refill  . acetaminophen (TYLENOL) 500 MG tablet Take 500 mg by mouth every 6 (six) hours as needed.    Marland Kitchen albuterol (  PROVENTIL HFA;VENTOLIN HFA) 108 (90 BASE) MCG/ACT inhaler Inhale 2 puffs into the lungs every 6 (six) hours as needed for wheezing or shortness of breath. 1 Inhaler 2  . amLODipine (NORVASC) 10 MG tablet Take 1 tablet (10 mg total) by mouth daily. 90 tablet 3  . aspirin 81 MG chewable tablet Chew 81 mg by mouth daily.    Marland Kitchen atorvastatin (LIPITOR) 80 MG tablet Take 1 tablet (80 mg total) by mouth daily. 30 tablet 11  . calcium acetate (PHOSLO) 667 MG capsule Take 2-4 capsules by mouth 3 (three) times daily. And with snacks  4  . carvedilol (COREG) 25 MG tablet Take 37.5 mg by mouth 2 (two) times daily with a meal.    . colchicine 0.6 MG tablet Take 0.6 mg by mouth 2 (two) times daily as needed (gout).     Marland Kitchen  diphenhydramine-acetaminophen (TYLENOL PM) 25-500 MG TABS Take 1-2 tablets by mouth at bedtime as needed (for pain and sleep).    . docusate sodium (COLACE) 100 MG capsule Take 3 mg by mouth 2 (two) times daily as needed for mild constipation.     . furosemide (LASIX) 40 MG tablet Take 120 mg by mouth daily.  2  . gabapentin (NEURONTIN) 300 MG capsule Take 1 capsule by mouth daily.  11  . isosorbide mononitrate (IMDUR) 30 MG 24 hr tablet Take 1 tablet (30 mg total) by mouth 2 (two) times daily. 60 tablet 6  . lidocaine-prilocaine (EMLA) cream Apply 1 application topically as needed. For port access  10  . magnesium oxide (MAG-OX) 400 MG tablet Take 400 mg by mouth daily.    . metolazone (ZAROXOLYN) 2.5 MG tablet TAKE 1 BY MOUTH AS NEEDED  FOR WEIGHT OVER 220 POUNDS 30 tablet 2  . multivitamin (RENA-VIT) TABS tablet Take 1 tablet by mouth daily.    . nitroGLYCERIN (NITROSTAT) 0.4 MG SL tablet Place 1 tablet (0.4 mg total) under the tongue every 5 (five) minutes as needed for chest pain. 25 tablet 3   No current facility-administered medications for this visit.     Past Surgical History  Procedure Laterality Date  . Arm surgery Right [id number]    "@ least 3 ORs; took nerves out of my left leg and put them in my arm" (10/12/2012)  . Cardiac catheterization  03/28/12    20% prox and mid LAD, 100% distal LAD, 30% prox OM, 100% distal OM, 40% prox RCA, 40% mid RCA, 25% distal RCA, small PDA w/ 80% diffuse dz, PLB small w/ 50% diffuse stenoses. No focal lesions for PCI. Recommendations for continued medical management and aggressive RF reduction  . Refractive surgery Bilateral ~ 2005  . Eye surgery Left 1990's    "blood vessel ruptured" (10/12/2012)  . Colonoscopy    . Femoral-popliteal bypass graft Right 07/17/2013    Procedure: BYPASS GRAFT FEMORAL-POPLITEAL ARTERY-RIGHT;  Surgeon: Sherren Kerns, MD;  Location: [facility] OR;  Service: Vascular;  Laterality: Right;  . Endarterectomy femoral Right  07/17/2013    Procedure: ENDARTERECTOMY FEMORAL WITH PROFUNDAPLOASTY;  Surgeon: Sherren Kerns, MD;  Location: [facility] OR;  Service: Vascular;  Laterality: Right;  . Patch angioplasty Right 07/17/2013    Procedure: PATCH ANGIOPLASTY OF COMMON FEMORAL AND PROFUNDA USING SEGMENT OF GREATER SAPHENOUS VEIN ;  Surgeon: Sherren Kerns, MD;  Location: [facility] OR;  Service: Vascular;  Laterality: Right;  . Av fistula placement Left 10/16/2013    Procedure: CREATION OF LEFT ARM BASILIC TO BRACHIAL FISTULA;  Surgeon:  Sherren Kerns, MD;  Location: [facility] OR;  Service: Vascular;  Laterality: Left;  . Left heart catheterization with coronary angiogram N/A 03/28/2012    Procedure: LEFT HEART CATHETERIZATION WITH CORONARY ANGIOGRAM;  Surgeon: Kathleene Hazel, MD;  Location: [facility] CATH LAB;  Service: Cardiovascular;  Laterality: N/A;  . Lower extremity angiogram Right 07/11/2013    Procedure: LOWER EXTREMITY ANGIOGRAM;  Surgeon: Fransisco Hertz, MD;  Location: [facility] CATH LAB;  Service: Cardiovascular;  Laterality: Right;  rt leg angio  . Bascilic vein transposition Left 08/13/2014    Procedure: Left arm SECOND STAGE BASILIC VEIN TRANSPOSITION;  Surgeon: Sherren Kerns, MD;  Location: [facility] OR;  Service: Vascular;  Laterality: Left;  . Peripheral vascular catheterization N/A 02/28/2015    Procedure: Fistulagram;  Surgeon: Sherren Kerns, MD;  Location: [facility] INVASIVE CV LAB;  Service: Cardiovascular;  Laterality: N/A;  . Peripheral vascular catheterization Left 02/28/2015    Procedure: A/V Shunt Intervention;  Surgeon: Sherren Kerns, MD;  Location: [facility] INVASIVE CV LAB;  Service: Cardiovascular;  Laterality: Left;     Allergies  Allergen Reactions  . Vytorin [Ezetimibe-Simvastatin]     Weakness, muscle aches bad.  . Gabapentin Other (See Comments)    Twitching  . Oxycodone Nausea And Vomiting      Family History  Problem Relation Age of Onset  . Heart attack Father 75  . Hypertension Father   . Hypertension Mother     . Cancer Mother   . Lupus Mother      Social History Mr. Beverlin reports that he quit smoking about 2 years ago. His smoking use included Cigarettes. He started smoking about 34 years ago. He has a 12.5 pack-year smoking history. He has never used smokeless tobacco. Mr. Mcbryar reports that he does not drink alcohol.   Review of Systems CONSTITUTIONAL: No weight loss, fever, chills, weakness or fatigue.  HEENT: Eyes: No visual loss, blurred vision, double vision or yellow sclerae.No hearing loss, sneezing, congestion, runny nose or sore throat.  SKIN: No rash or itching.  CARDIOVASCULAR:  RESPIRATORY: No shortness of breath, cough or sputum.  GASTROINTESTINAL: No anorexia, nausea, vomiting or diarrhea. No abdominal pain or blood.  GENITOURINARY: No burning on urination, no polyuria NEUROLOGICAL: No headache, dizziness, syncope, paralysis, ataxia, numbness or tingling in the extremities. No change in bowel or bladder control.  MUSCULOSKELETAL: No muscle, back pain, joint pain or stiffness.  LYMPHATICS: No enlarged nodes. No history of splenectomy.  PSYCHIATRIC: No history of depression or anxiety.  ENDOCRINOLOGIC: No reports of sweating, cold or heat intolerance. No polyuria or polydipsia.  Marland Kitchen   Physical Examination There were no vitals filed for this visit. There were no vitals filed for this visit.  Gen: resting comfortably, no acute distress HEENT: no scleral icterus, pupils equal round and reactive, no palptable cervical adenopathy,  CV Resp: Clear to auscultation bilaterally GI: abdomen is soft, non-tender, non-distended, normal bowel sounds, no hepatosplenomegaly MSK: extremities are warm, no edema.  Skin: warm, no rash Neuro:  no focal deficits Psych: appropriate affect   Diagnostic Studies Cath 03/2012  Hemodynamic Findings:  Central aortic pressure: 150/100  Left ventricular pressure: 148/17/21  Angiographic Findings:  Left main: No obstructive disease.  Left  Anterior Descending Artery: Large vessel that coursed to the apex. The proximal and mid vessel had serial 20% lesions. The distal vessel had 100% occlusion just before the apex. The vessel was 1.75 mm in this location. The diagonal Nadean Montanaro was small in caliber and  patent with mild diffuse disease.  Circumflex Artery: Large caliber vessel with large first obtuse marginal Delina Kruczek. There is a 30% stenosis in the proximal portion of the obtuse marginal Srihitha Tagliaferri. The distal segment of the OM Saskia Simerson is 100% occluded.  Right Coronary Artery: Large, dominant vessel with 40% proximal stenosis, long tubular 40% mid stenosis, serial 25% distal stenoses. The PDA is very small caliber and has diffuse 80% stenosis. The Posterolateral Chinyere Galiano is small in caliber and has diffuse 50% stenosis.  Left Ventricular Angiogram: Deferred.  Impression:  1. Triple vessel CAD with occlusion of the apical LAD and distal portion of the first obtuse marginal Alvino Lechuga. No focal lesions for PCI  2. NSTEMI secondary to above.  Recommendations: Will continue medical management with aggressive risk factor reduction. He will need to stop smoking. We will continue statin, beta blocker. Will continue ASA and will load with Plavix. Will check echo to assess LVEF.  Complications: None. The patient tolerated the procedure well.   03/2012 echo  LVEF 40-45%, mild LVH, multiple WMAs, grade II diastolic dysfunction,   09/2013 Carotid US  Bilateral 1-39% ICA stenosis, patent antegrade vertebrals  02/2014 Lexiscan MPI IMPRESSION: 1. Inferior wall infarct extending from apex to base. No reversible ischemia.  2. Severe inferior wall hypokinesis.  3. Left ventricular ejection fraction 33%  4. High-risk stress test findings based upon extent of myocardial scar and severely reduced left ventricular systolic function.  11/2013 Echo Study Conclusions  - Left ventricle: The cavity size was mildly dilated. Wall thickness was  increased in a pattern of mild LVH. Systolic function was normal. The estimated ejection fraction was approximately 55%. Features are consistent with a pseudonormal left ventricular filling pattern, with concomitant abnormal relaxation and increased filling pressure (grade 2 diastolic dysfunction). Doppler parameters are consistent with high ventricular filling pressure. - Regional wall motion abnormality: Moderate hypokinesis of the basal inferior and mid inferolateral myocardium; mild hypokinesis of the mid inferior, basal-mid anterolateral, and apical lateral myocardium. - Aortic valve: Mildly thickened leaflets. - Mitral valve: Mildly thickened leaflets . There was trivial regurgitation. - Tricuspid valve: There was trivial regurgitation. - Pulmonic valve: There was trivial regurgitation.   04/2015 [facility] Cath (cannot find actual report in Careeverywhere. Results referred to in clinic note) Left Heart Cath done on 12/08 which showed similar disease when compared to [facility] report. Documentation mentioned distal, small disease that is causing his symptoms. No noticeable large defects that could be intervened upon. So he was medically optimized and discharged home on Imdur 15    Assessment and Plan  1. CAD/ICM/Chronic systolic heart failure - no current symptoms, continue current meds.   2. HL  - change to atorvastatin  daily in setting of known CAD, he now has insurance and should be able to get   3. HTN  - continue current meds , at goal  4. PAD  - follow with vascular   5. ESRD - compliant with HD, cannot tolerate long sessions due to muscle cramps. We will give metolazone 2.5mg  prn, continue lasix  daily      Antoine Poche, M.D., F.A.C.C.

## 2015-10-01 ENCOUNTER — Other Ambulatory Visit: Payer: Self-pay | Admitting: *Deleted

## 2015-10-01 MED ORDER — NITROGLYCERIN 0.4 MG SL SUBL: 0.4000 mg | SUBLINGUAL_TABLET | SUBLINGUAL | Status: DC | PRN

## 2016-01-01 ENCOUNTER — Emergency Department (HOSPITAL_COMMUNITY): Payer: BLUE CROSS/BLUE SHIELD

## 2016-01-01 ENCOUNTER — Inpatient Hospital Stay (HOSPITAL_COMMUNITY)
Admission: EM | Admit: 2016-01-01 | Discharge: 2016-01-03 | DRG: 377 | Disposition: A | Discharge status: Home / Self Care | Payer: BLUE CROSS/BLUE SHIELD | Attending: Internal Medicine | Admitting: Internal Medicine

## 2016-01-01 ENCOUNTER — Encounter (HOSPITAL_COMMUNITY): Payer: Self-pay

## 2016-01-01 DIAGNOSIS — I252 Old myocardial infarction: Secondary | ICD-10-CM

## 2016-01-01 DIAGNOSIS — Z7982 Long term (current) use of aspirin: Secondary | ICD-10-CM | POA: Diagnosis not present

## 2016-01-01 DIAGNOSIS — K920 Hematemesis: Principal | ICD-10-CM | POA: Diagnosis present

## 2016-01-01 DIAGNOSIS — Z87891 Personal history of nicotine dependence: Secondary | ICD-10-CM

## 2016-01-01 DIAGNOSIS — R0902 Hypoxemia: Secondary | ICD-10-CM

## 2016-01-01 DIAGNOSIS — Z992 Dependence on renal dialysis: Secondary | ICD-10-CM

## 2016-01-01 DIAGNOSIS — I739 Peripheral vascular disease, unspecified: Secondary | ICD-10-CM | POA: Diagnosis present

## 2016-01-01 DIAGNOSIS — R7989 Other specified abnormal findings of blood chemistry: Secondary | ICD-10-CM | POA: Diagnosis present

## 2016-01-01 DIAGNOSIS — R112 Nausea with vomiting, unspecified: Secondary | ICD-10-CM

## 2016-01-01 DIAGNOSIS — R778 Other specified abnormalities of plasma proteins: Secondary | ICD-10-CM | POA: Diagnosis present

## 2016-01-01 DIAGNOSIS — I132 Hypertensive heart and chronic kidney disease with heart failure and with stage 5 chronic kidney disease, or end stage renal disease: Secondary | ICD-10-CM | POA: Diagnosis present

## 2016-01-01 DIAGNOSIS — E782 Mixed hyperlipidemia: Secondary | ICD-10-CM | POA: Diagnosis present

## 2016-01-01 DIAGNOSIS — E785 Hyperlipidemia, unspecified: Secondary | ICD-10-CM | POA: Diagnosis present

## 2016-01-01 DIAGNOSIS — Z6831 Body mass index (BMI) 31.0-31.9, adult: Secondary | ICD-10-CM

## 2016-01-01 DIAGNOSIS — I255 Ischemic cardiomyopathy: Secondary | ICD-10-CM | POA: Diagnosis present

## 2016-01-01 DIAGNOSIS — Z7984 Long term (current) use of oral hypoglycemic drugs: Secondary | ICD-10-CM

## 2016-01-01 DIAGNOSIS — I2510 Atherosclerotic heart disease of native coronary artery without angina pectoris: Secondary | ICD-10-CM

## 2016-01-01 DIAGNOSIS — D649 Anemia, unspecified: Secondary | ICD-10-CM | POA: Diagnosis present

## 2016-01-01 DIAGNOSIS — Z809 Family history of malignant neoplasm, unspecified: Secondary | ICD-10-CM

## 2016-01-01 DIAGNOSIS — E669 Obesity, unspecified: Secondary | ICD-10-CM | POA: Diagnosis present

## 2016-01-01 DIAGNOSIS — E1122 Type 2 diabetes mellitus with diabetic chronic kidney disease: Secondary | ICD-10-CM | POA: Diagnosis present

## 2016-01-01 DIAGNOSIS — I5032 Chronic diastolic (congestive) heart failure: Secondary | ICD-10-CM | POA: Diagnosis present

## 2016-01-01 DIAGNOSIS — Z8249 Family history of ischemic heart disease and other diseases of the circulatory system: Secondary | ICD-10-CM

## 2016-01-01 DIAGNOSIS — N186 End stage renal disease: Secondary | ICD-10-CM | POA: Diagnosis present

## 2016-01-01 DIAGNOSIS — R0602 Shortness of breath: Secondary | ICD-10-CM

## 2016-01-01 DIAGNOSIS — E8889 Other specified metabolic disorders: Secondary | ICD-10-CM | POA: Diagnosis present

## 2016-01-01 DIAGNOSIS — K922 Gastrointestinal hemorrhage, unspecified: Secondary | ICD-10-CM | POA: Diagnosis present

## 2016-01-01 DIAGNOSIS — M109 Gout, unspecified: Secondary | ICD-10-CM | POA: Diagnosis present

## 2016-01-01 LAB — COMPREHENSIVE METABOLIC PANEL
ALT: 11 U/L — ABNORMAL LOW (ref 17–63)
AST: 14 U/L — ABNORMAL LOW (ref 15–41)
Albumin: 4 g/dL (ref 3.5–5.0)
Alkaline Phosphatase: 46 U/L (ref 38–126)
Anion gap: 13 (ref 5–15)
BUN: 73 mg/dL — ABNORMAL HIGH (ref 6–20)
CO2: 22 mmol/L (ref 22–32)
Calcium: 8.5 mg/dL — ABNORMAL LOW (ref 8.9–10.3)
Chloride: 101 mmol/L (ref 101–111)
Creatinine, Ser: 13.16 mg/dL — ABNORMAL HIGH (ref 0.61–1.24)
GFR calc Af Amer: 5 mL/min — ABNORMAL LOW (ref >60)
GFR calc non Af Amer: 4 mL/min — ABNORMAL LOW (ref >60)
Glucose, Bld: 80 mg/dL (ref 65–99)
Potassium: 4.2 mmol/L (ref 3.5–5.1)
Sodium: 136 mmol/L (ref 135–145)
Total Bilirubin: 1 mg/dL (ref 0.3–1.2)
Total Protein: 6.9 g/dL (ref 6.5–8.1)

## 2016-01-01 LAB — CBC WITH DIFFERENTIAL/PLATELET
Basophils Absolute: 0 10*3/uL (ref 0.0–0.1)
Basophils Relative: 0 %
Eosinophils Absolute: 0 10*3/uL (ref 0.0–0.7)
Eosinophils Relative: 0 %
HCT: 28.8 % — ABNORMAL LOW (ref 39.0–52.0)
Hemoglobin: 9 g/dL — ABNORMAL LOW (ref 13.0–17.0)
Lymphocytes Relative: 4 %
Lymphs Abs: 0.6 10*3/uL — ABNORMAL LOW (ref 0.7–4.0)
MCH: 29.3 pg (ref 26.0–34.0)
MCHC: 31.3 g/dL (ref 30.0–36.0)
MCV: 93.8 fL (ref 78.0–100.0)
Monocytes Absolute: 0.6 10*3/uL (ref 0.1–1.0)
Monocytes Relative: 5 %
Neutro Abs: 11.5 10*3/uL — ABNORMAL HIGH (ref 1.7–7.7)
Neutrophils Relative %: 91 %
Platelets: 173 10*3/uL (ref 150–400)
RBC: 3.07 MIL/uL — ABNORMAL LOW (ref 4.22–5.81)
RDW: 15.7 % — ABNORMAL HIGH (ref 11.5–15.5)
WBC: 12.8 10*3/uL — ABNORMAL HIGH (ref 4.0–10.5)

## 2016-01-01 LAB — MRSA PCR SCREENING: MRSA BY PCR: POSITIVE — AB

## 2016-01-01 LAB — TROPONIN I
Troponin I: 0.05 ng/mL (ref <0.03)
Troponin I: 0.07 ng/mL (ref <0.03)

## 2016-01-01 MED ORDER — ONDANSETRON HCL 4 MG/2ML IJ SOLN
4.0000 mg | Freq: Four times a day (QID) | INTRAMUSCULAR | Status: DC | PRN
Start: 2016-01-01 — End: 2016-01-03
  Administered 2016-01-02: 4 mg via INTRAVENOUS
  Filled 2016-01-01: qty 2

## 2016-01-01 MED ORDER — FAMOTIDINE IN NACL 20-0.9 MG/50ML-% IV SOLN
20.0000 mg | Freq: Once | INTRAVENOUS | Status: AC
  Administered 2016-01-01: 20 mg via INTRAVENOUS
  Filled 2016-01-01: qty 50

## 2016-01-01 MED ORDER — CHLORHEXIDINE GLUCONATE CLOTH 2 % EX PADS
6.0000 | MEDICATED_PAD | Freq: Every day | CUTANEOUS | Status: DC
  Administered 2016-01-02 – 2016-01-03 (×2): 6 via TOPICAL

## 2016-01-01 MED ORDER — CALCIUM ACETATE (PHOS BINDER) 667 MG PO CAPS: 1334.0000 mg | ORAL_CAPSULE | Freq: Three times a day (TID) | ORAL | Status: DC

## 2016-01-01 MED ORDER — CALCIUM ACETATE (PHOS BINDER) 667 MG PO CAPS: 1334.0000 mg | ORAL_CAPSULE | ORAL | Status: DC | PRN

## 2016-01-01 MED ORDER — ONDANSETRON HCL 4 MG PO TABS: 4.0000 mg | ORAL_TABLET | Freq: Four times a day (QID) | ORAL | Status: DC | PRN

## 2016-01-01 MED ORDER — MUPIROCIN 2 % EX OINT
1.0000 "application " | TOPICAL_OINTMENT | Freq: Two times a day (BID) | CUTANEOUS | Status: DC
  Administered 2016-01-01 – 2016-01-03 (×4): 1 via NASAL
  Filled 2016-01-01: qty 22

## 2016-01-01 MED ORDER — ISOSORBIDE MONONITRATE ER 60 MG PO TB24
30.0000 mg | ORAL_TABLET | Freq: Two times a day (BID) | ORAL | Status: DC
  Administered 2016-01-01 – 2016-01-03 (×3): 30 mg via ORAL
  Filled 2016-01-01 (×3): qty 1

## 2016-01-01 MED ORDER — PANTOPRAZOLE SODIUM 40 MG IV SOLR
40.0000 mg | Freq: Two times a day (BID) | INTRAVENOUS | Status: DC
  Administered 2016-01-01 – 2016-01-03 (×4): 40 mg via INTRAVENOUS
  Filled 2016-01-01 (×4): qty 40

## 2016-01-01 MED ORDER — ACETAMINOPHEN 650 MG RE SUPP: 650.0000 mg | Freq: Four times a day (QID) | RECTAL | Status: DC | PRN

## 2016-01-01 MED ORDER — ATORVASTATIN CALCIUM 40 MG PO TABS
80.0000 mg | ORAL_TABLET | Freq: Every day | ORAL | Status: DC
  Administered 2016-01-01: 80 mg via ORAL
  Filled 2016-01-01: qty 2

## 2016-01-01 MED ORDER — ACETAMINOPHEN 325 MG PO TABS: 650.0000 mg | ORAL_TABLET | Freq: Four times a day (QID) | ORAL | Status: DC | PRN

## 2016-01-01 MED ORDER — ONDANSETRON HCL 4 MG/2ML IJ SOLN
4.0000 mg | Freq: Once | INTRAMUSCULAR | Status: AC
  Administered 2016-01-01: 4 mg via INTRAVENOUS
  Filled 2016-01-01: qty 2

## 2016-01-01 MED ORDER — CALCIUM ACETATE (PHOS BINDER) 667 MG PO CAPS
2668.0000 mg | ORAL_CAPSULE | Freq: Three times a day (TID) | ORAL | Status: DC
  Administered 2016-01-03: 2668 mg via ORAL
  Filled 2016-01-01 (×3): qty 4

## 2016-01-01 MED ORDER — FUROSEMIDE 80 MG PO TABS
120.0000 mg | ORAL_TABLET | Freq: Every day | ORAL | Status: DC
  Administered 2016-01-03: 120 mg via ORAL
  Filled 2016-01-01: qty 1

## 2016-01-01 MED ORDER — AMLODIPINE BESYLATE 5 MG PO TABS
10.0000 mg | ORAL_TABLET | Freq: Every day | ORAL | Status: DC
  Administered 2016-01-03: 10 mg via ORAL
  Filled 2016-01-01: qty 2

## 2016-01-01 MED ORDER — SODIUM CHLORIDE 0.9% FLUSH
3.0000 mL | Freq: Two times a day (BID) | INTRAVENOUS | Status: DC
  Administered 2016-01-01 – 2016-01-03 (×4): 3 mL via INTRAVENOUS

## 2016-01-01 MED ORDER — ZOLPIDEM TARTRATE 5 MG PO TABS
5.0000 mg | ORAL_TABLET | Freq: Every evening | ORAL | Status: DC | PRN
  Administered 2016-01-01 – 2016-01-02 (×2): 5 mg via ORAL
  Filled 2016-01-01 (×2): qty 1

## 2016-01-01 MED ORDER — METOLAZONE 5 MG PO TABS
2.5000 mg | ORAL_TABLET | Freq: Every day | ORAL | Status: DC
  Administered 2016-01-03: 2.5 mg via ORAL
  Filled 2016-01-01: qty 1

## 2016-01-01 MED ORDER — DOCUSATE SODIUM 100 MG PO CAPS: 100.0000 mg | ORAL_CAPSULE | Freq: Two times a day (BID) | ORAL | Status: DC | PRN

## 2016-01-01 MED ORDER — CARVEDILOL 12.5 MG PO TABS
37.5000 mg | ORAL_TABLET | Freq: Two times a day (BID) | ORAL | Status: DC
  Administered 2016-01-01 – 2016-01-03 (×2): 37.5 mg via ORAL
  Filled 2016-01-01 (×2): qty 3

## 2016-01-01 MED ORDER — ALBUTEROL SULFATE (2.5 MG/3ML) 0.083% IN NEBU: 3.0000 mL | INHALATION_SOLUTION | Freq: Four times a day (QID) | RESPIRATORY_TRACT | Status: DC | PRN

## 2016-01-01 NOTE — ED Provider Notes (Signed)
[facility]-EMERGENCY DEPT Provider Note   CSN: [id number] Arrival date & time: 01/01/16  1410     History   Chief Complaint Chief Complaint  Patient presents with  . Emesis    HPI [name] is a 45 y.o. male.  HPI    45 y.o. male with a history of end-stage renal disease on hemodialysis Monday, Wednesday, Friday, congestive heart failure with grade 2 diastolic dysfunction, CAD U with NSTEMI 2, essential hypertension, diet-controlled diabetes. Patient presented for dialysis today than had emesis that was described as coffee-ground. Family members also stated that she had some blood in his emesis. Patient had nonradiating, mild chest discomfort in the left breast at the same time, which is intermittent now and improving. He states that his chest pain is different from his traditional chest pain with his previous MIs. No previous episodes of hematemesis.   Past Medical History:  Diagnosis Date  . Anemia   . Anginal pain (HCC)   . CHF (congestive heart failure) (HCC)   . CKD (chronic kidney disease) stage 3, GFR 30-59 ml/min    on dialysis M/W/F - started March 2016  . Coronary atherosclerosis of native coronary artery    100% apical LAD and distal OM1, Rx medically - 11/13  . Depression   . Essential hypertension, benign   . Gouty arthritis   . History of blood transfusion 1988   "arm went thru glass window" (10/12/2012)  . Ischemic cardiomyopathy    LVEF 40-45%  . Mixed hyperlipidemia   . NSTEMI (non-ST elevated myocardial infarction) (HCC) 02/2012 and 10/2012  . PAD (peripheral artery disease) (HCC)    Normal ABIs, 2011; focal left EIA stenosis; mild bilateral distal SFA disease, 2008  . RBBB   . Shortness of breath    when he has fluid he gets sob  . Type 2 diabetes mellitus (HCC)    diet controlled    Patient Active Problem List   Diagnosis Date Noted  . End stage renal disease (HCC) 11/01/2013  . Pain in limb-Right groin 09/06/2013  . Discoloration of skin of  toe-Right great toe 09/06/2013  . Wound drainage-Right groin-Right Thigh 09/06/2013  . Boil, groin- Right  09/06/2013  . Blood in stool- Today 09/06/2013  . Cellulitis 08/08/2013  . Acute on chronic renal failure (HCC) 07/09/2013  . Noninfectious gastroenteritis and colitis 07/09/2013  . CKD (chronic kidney disease), stage IV (HCC) 07/09/2013  . PVD (peripheral vascular disease) (HCC) 06/28/2013  . Ischemic toe 05/31/2013  . Ischemic cardiomyopathy   . Coronary atherosclerosis of native coronary artery 07/12/2012  . CKD (chronic kidney disease) stage 3, GFR 30-59 ml/min   . NSTEMI (non-ST elevated myocardial infarction) (HCC) 03/28/2012  . Hyperlipidemia 03/28/2012  . Type 2 diabetes mellitus with diabetic chronic kidney disease (HCC) 03/28/2012  . PAD (peripheral artery disease) (HCC) 03/28/2012  . Tobacco abuse 03/28/2012  . Essential hypertension, benign 03/28/2012    Past Surgical History:  Procedure Laterality Date  . Arm surgery Right [phone]   "@ least 3 ORs; took nerves out of my left leg and put them in my arm" (10/12/2012)  . AV FISTULA PLACEMENT Left 10/16/2013   Procedure: CREATION OF LEFT ARM BASILIC TO BRACHIAL FISTULA;  Surgeon: Sherren Kerns, MD;  Location: [facility] OR;  Service: Vascular;  Laterality: Left;  . BASCILIC VEIN TRANSPOSITION Left 08/13/2014   Procedure: Left arm SECOND STAGE BASILIC VEIN TRANSPOSITION;  Surgeon: Sherren Kerns, MD;  Location: [facility] OR;  Service: Vascular;  Laterality: Left;  . CARDIAC CATHETERIZATION  03/28/12   20% prox and mid LAD, 100% distal LAD, 30% prox OM, 100% distal OM, 40% prox RCA, 40% mid RCA, 25% distal RCA, small PDA w/ 80% diffuse dz, PLB small w/ 50% diffuse stenoses. No focal lesions for PCI. Recommendations for continued medical management and aggressive RF reduction  . COLONOSCOPY    . ENDARTERECTOMY FEMORAL Right 07/17/2013   Procedure: ENDARTERECTOMY FEMORAL WITH PROFUNDAPLOASTY;  Surgeon: Sherren Kerns, MD;  Location:  [facility] OR;  Service: Vascular;  Laterality: Right;  . EYE SURGERY Left 1990's   "blood vessel ruptured" (10/12/2012)  . FEMORAL-POPLITEAL BYPASS GRAFT Right 07/17/2013   Procedure: BYPASS GRAFT FEMORAL-POPLITEAL ARTERY-RIGHT;  Surgeon: Sherren Kerns, MD;  Location: [facility] OR;  Service: Vascular;  Laterality: Right;  . LEFT HEART CATHETERIZATION WITH CORONARY ANGIOGRAM N/A 03/28/2012   Procedure: LEFT HEART CATHETERIZATION WITH CORONARY ANGIOGRAM;  Surgeon: Kathleene Hazel, MD;  Location: [facility] CATH LAB;  Service: Cardiovascular;  Laterality: N/A;  . LOWER EXTREMITY ANGIOGRAM Right 07/11/2013   Procedure: LOWER EXTREMITY ANGIOGRAM;  Surgeon: Fransisco Hertz, MD;  Location: [facility] CATH LAB;  Service: Cardiovascular;  Laterality: Right;  rt leg angio  . PATCH ANGIOPLASTY Right 07/17/2013   Procedure: PATCH ANGIOPLASTY OF COMMON FEMORAL AND PROFUNDA USING SEGMENT OF GREATER SAPHENOUS VEIN ;  Surgeon: Sherren Kerns, MD;  Location: [facility] OR;  Service: Vascular;  Laterality: Right;  . PERIPHERAL VASCULAR CATHETERIZATION N/A 02/28/2015   Procedure: Fistulagram;  Surgeon: Sherren Kerns, MD;  Location: [facility] INVASIVE CV LAB;  Service: Cardiovascular;  Laterality: N/A;  . PERIPHERAL VASCULAR CATHETERIZATION Left 02/28/2015   Procedure: A/V Shunt Intervention;  Surgeon: Sherren Kerns, MD;  Location: [facility] INVASIVE CV LAB;  Service: Cardiovascular;  Laterality: Left;  . REFRACTIVE SURGERY Bilateral ~ 2005       Home Medications    Prior to Admission medications   Medication Sig Start Date End Date Taking? Authorizing Provider  acetaminophen (TYLENOL) 500 MG tablet Take 500 mg by mouth every 6 (six) hours as needed.   Yes Historical Provider, MD  albuterol (PROVENTIL HFA;VENTOLIN HFA) 108 (90 BASE) MCG/ACT inhaler Inhale 2 puffs into the lungs every 6 (six) hours as needed for wheezing or shortness of breath. 04/14/15  Yes Antoine Poche, MD  amLODipine (NORVASC) 10 MG tablet Take 1 tablet (10 mg total) by mouth  daily. 03/27/14  Yes Antoine Poche, MD  aspirin 81 MG chewable tablet Chew 81 mg by mouth daily.   Yes Historical Provider, MD  atorvastatin (LIPITOR) 80 MG tablet Take 1 tablet (80 mg total) by mouth daily. 08/06/15  Yes Antoine Poche, MD  calcium acetate (PHOSLO) 667 MG capsule Take 2-4 capsules by mouth 3 (three) times daily. And with snacks 02/17/15  Yes Historical Provider, MD  carvedilol (COREG) 25 MG tablet Take 37.5 mg by mouth 2 (two) times daily with a meal.   Yes Historical Provider, MD  colchicine 0.6 MG tablet Take 0.6 mg by mouth 2 (two) times daily as needed (gout).    Yes Historical Provider, MD  docusate sodium (COLACE) 100 MG capsule Take 3 mg by mouth 2 (two) times daily as needed for mild constipation.    Yes Historical Provider, MD  furosemide (LASIX) 40 MG tablet Take 120 mg by mouth daily. 02/25/15  Yes Historical Provider, MD  isosorbide mononitrate (IMDUR) 30 MG 24 hr tablet Take 1 tablet (30 mg total) by mouth 2 (two) times daily. 07/10/15  Yes Antoine PocheJonathan F Branch, MD  lidocaine-prilocaine (EMLA) cream Apply 1 application topically as needed. For port access 01/27/15  Yes Historical Provider, MD  metolazone (ZAROXOLYN) 2.5 MG tablet TAKE 1 BY MOUTH AS NEEDED  FOR WEIGHT OVER 220 POUNDS 09/25/15  Yes Antoine PocheJonathan F Branch, MD  multivitamin (RENA-VIT) TABS tablet Take 1 tablet by mouth daily.   Yes Historical Provider, MD  nitroGLYCERIN (NITROSTAT) 0.4 MG SL tablet Place 1 tablet (0.4 mg total) under the tongue every 5 (five) minutes as needed for chest pain. 10/01/15  Yes Antoine PocheJonathan F Branch, MD  diphenhydramine-acetaminophen (TYLENOL PM) 25-500 MG TABS Take 1-2 tablets by mouth at bedtime as needed (for pain and sleep).    Historical Provider, MD    Family History Family History  Problem Relation Age of Onset  . Heart attack Father [id number]  . Hypertension Father   . Hypertension Mother   . Cancer Mother   . Lupus Mother     Social History Social History  Substance Use  Topics  . Smoking status: Former Smoker    Packs/day: 0.50    Years: 25.00    Types: Cigarettes    Start date: 07/23/1981    Quit date: 07/11/2013  . Smokeless tobacco: Never Used  . Alcohol use No     Comment: 10/12/2012 "quit drinking > 10 yr ago; was an alcoholic"     Allergies   Vytorin [ezetimibe-simvastatin]; Gabapentin; and Oxycodone   Review of Systems Review of Systems  All systems reviewed and negative, other than as noted in HPI.   Physical Exam Updated Vital Signs BP 162/83 (BP Location: Right Arm)   Pulse 94   Temp 98.1 F (36.7 C) (Oral)   Resp 20   Ht 5\' 9"  (1.753 m)   Wt 215 lb (97.5 kg)   SpO2 96%   BMI 31.75 kg/m   Physical Exam  Constitutional: He appears well-developed and well-nourished.  HENT:  Head: Normocephalic and atraumatic.  Eyes: Conjunctivae are normal.  Neck: Neck supple.  Cardiovascular: Normal rate and regular rhythm.   No murmur heard. Pulmonary/Chest: Effort normal and breath sounds normal. No respiratory distress.  Abdominal: Soft. There is no tenderness.  Musculoskeletal: He exhibits no edema.  Neurological: He is alert.  Skin: Skin is warm and dry.  Psychiatric: He has a normal mood and affect.  Nursing note and vitals reviewed.    ED Treatments / Results  Labs (all labs ordered are listed, but only abnormal results are displayed) Labs Reviewed  CBC WITH DIFFERENTIAL/PLATELET  TROPONIN I  COMPREHENSIVE METABOLIC PANEL    EKG  EKG Interpretation  Date/Time:  Thursday January 01 2016 14:20:26 EDT Ventricular Rate:  92 PR Interval:    QRS Duration: 150 QT Interval:  393 QTC Calculation: 487 R Axis:   97 Text Interpretation:  Sinus rhythm RBBB and LPFB Baseline wander in lead(s) V4 Confirmed by Juleen ChinaKOHUT  MD, Kier Smead ([id number]) on 01/01/2016 3:21:56 PM       Radiology No results found.  Procedures Procedures (including critical care time)  Medications Ordered in ED Medications - No data to display   Initial  Impression / Assessment and Plan / ED Course  I have reviewed the triage vital signs and the nursing notes.  Pertinent labs & imaging results that were available during my care of the patient were reviewed by me and considered in my medical decision making (see chart for details).  Clinical Course    Final Clinical Impressions(s) / ED Diagnoses  Final diagnoses:  Hypoxemia  Non-intractable vomiting with nausea, vomiting of unspecified type  Anemia, unspecified anemia type    New Prescriptions New Prescriptions   No medications on file     Raeford Razor, MD 01/17/16 1643

## 2016-01-01 NOTE — ED Notes (Signed)
CRITICAL VALUE ALERT  Critical value received:  Trop 0.05  Date of notification:  01/01/16  Time of notification:  1557  Critical value read back:Yes.    Nurse who received alert:  RMinter, RN  MD notified (1st page):  Dr. Juleen China  Time of first page:  1557  MD notified (2nd page):  Time of second page:  Responding MD:  Dr. Juleen China  Time MD responded:  [id number]

## 2016-01-01 NOTE — ED Notes (Signed)
CRITICAL VALUE ALERT  Critical value received:  Troponin 0.07  Date of notification:  01/01/2016  Time of notification:  1923  Critical value read back:Yes.   Nurse who received alert:  BKN  MD notified (1st page):  Kohut  Time of first page: 1925  MD notified (2nd page):  Time of second page:  Responding MD:    Time MD responded:

## 2016-01-01 NOTE — ED Notes (Signed)
Hospitalist at bedside 

## 2016-01-01 NOTE — ED Triage Notes (Signed)
Pt reports has been vomiting coffee ground emesis today.  Reports went to dialysis for treatment and they sent him here for eval.  Pt did not have any dialysis.  Pt c/o chest pain earlier today.

## 2016-01-01 NOTE — H&P (Addendum)
History and Physical  Donneta RombergShawn L Barnhardt ZOX:[id number] DOB: Feb 15, 1971 DOA: 01/01/2016  Referring physician: Dr Juleen ChinaKohut, ED physician PCP: Louie BostonAPPER,DAVID B, MD  Outpatient Specialists:   [facility]illern[facility] (nephrology)  [facility] (Cardiology)  Chief Complaint: Vomiting blood  HPI: Donneta RombergShawn L Novacek is a 45 y.o. male with a history of end-stage renal disease on hemodialysis Monday, Wednesday, Friday, congestive heart failure with grade 2 diastolic dysfunction, CAD U with NSTEMI 2, essential hypertension, diet-controlled diabetes. Patient presented for dialysis today than had emesis that was described as coffee-ground. Family members also stated that she had some blood in his emesis. Patient had nonradiating, mild chest discomfort in the left breast at the same time, which is intermittent now and improving. He states that his chest pain is different from his traditional chest pain with his previous MIs. No previous episodes of hematemesis.   Review of Systems:   Pt denies any fevers, chills, nausea, vomiting, diarrhea, constipation, abdominal pain, shortness of breath, dyspnea on exertion, orthopnea, cough, wheezing, palpitations, headache, vision changes, lightheadedness, dizziness, melena, rectal bleeding.  Review of systems are otherwise negative  Past Medical History:  Diagnosis Date  . Anemia   . Anginal pain (HCC)   . CHF (congestive heart failure) (HCC)   . CKD (chronic kidney disease) stage 3, GFR 30-59 ml/min    on dialysis M/W/F - started March 2016  . Coronary atherosclerosis of native coronary artery    100% apical LAD and distal OM1, Rx medically - 11/13  . Depression   . Essential hypertension, benign   . Gouty arthritis   . History of blood transfusion 1988   "arm went thru glass window" (10/12/2012)  . Ischemic cardiomyopathy    LVEF 40-45%  . Mixed hyperlipidemia   . NSTEMI (non-ST elevated myocardial infarction) (HCC) 02/2012 and 10/2012  . PAD (peripheral artery disease) (HCC)    Normal  ABIs, 2011; focal left EIA stenosis; mild bilateral distal SFA disease, 2008  . RBBB   . Shortness of breath    when he has fluid he gets sob  . Type 2 diabetes mellitus (HCC)    diet controlled   Past Surgical History:  Procedure Laterality Date  . Arm surgery Right [phone]   "@ least 3 ORs; took nerves out of my left leg and put them in my arm" (10/12/2012)  . AV FISTULA PLACEMENT Left 10/16/2013   Procedure: CREATION OF LEFT ARM BASILIC TO BRACHIAL FISTULA;  Surgeon: Sherren Kernsharles E Fields, MD;  [facility];  Service: Vascular;  Laterality: Left;  . BASCILIC VEIN TRANSPOSITION Left 08/13/2014   Procedure: Left arm SECOND STAGE BASILIC VEIN TRANSPOSITION;  Surgeon: Sherren Kernsharles E Fields, MD;  [facility];  Service: Vascular;  Laterality: Left;  . CARDIAC CATHETERIZATION  03/28/12   20% prox and mid LAD, 100% distal LAD, 30% prox OM, 100% distal OM, 40% prox RCA, 40% mid RCA, 25% distal RCA, small PDA w/ 80% diffuse dz, PLB small w/ 50% diffuse stenoses. No focal lesions for PCI. Recommendations for continued medical management and aggressive RF reduction  . COLONOSCOPY    . ENDARTERECTOMY FEMORAL Right 07/17/2013   Procedure: ENDARTERECTOMY FEMORAL WITH PROFUNDAPLOASTY;  Surgeon: Sherren Kernsharles E Fields, MD;  [facility];  Service: Vascular;  Laterality: Right;  . EYE SURGERY Left 1990's   "blood vessel ruptured" (10/12/2012)  . FEMORAL-POPLITEAL BYPASS GRAFT Right 07/17/2013   Procedure: BYPASS GRAFT FEMORAL-POPLITEAL ARTERY-RIGHT;  Surgeon: Sherren Kernsharles E Fields, MD;  [facility];  Service: Vascular;  Laterality: Right;  .  LEFT HEART CATHETERIZATION WITH CORONARY ANGIOGRAM N/A 03/28/2012   Procedure: LEFT HEART CATHETERIZATION WITH CORONARY ANGIOGRAM;  Surgeon: Kathleene Hazel, MD;  Location: [facility] CATH LAB;  Service: Cardiovascular;  Laterality: N/A;  . LOWER EXTREMITY ANGIOGRAM Right 07/11/2013   Procedure: LOWER EXTREMITY ANGIOGRAM;  Surgeon: Fransisco Hertz, MD;  Location[facility] C[facility] LAB;  Service:  Cardiovascular;  Laterality: Right;  rt leg angio  . PATCH ANGIOPLASTY Right 07/17/2013   Procedure: PATCH ANGIOPLASTY OF COMMON FEMORAL AND PROFUNDA USING SEGMENT OF GREATER SAPHENOUS VEIN ;  Surgeon: Sherren Kerns, MD;  Location: [facility] OR;  Service: Vascular;  Laterality: Right;  . PERIPHERAL VASCULAR CATHETERIZATION N/A 02/28/2015   Procedure: Fistulagram;  Surgeon: Sherren Kerns, MD;  Location: [facility] INVASIVE CV LAB;  Service: Cardiovascular;  Laterality: N/A;  . PERIPHERAL VASCULAR CATHETERIZATION Left 02/28/2015   Procedure: A/V Shunt Intervention;  Surgeon: Sherren Kerns, MD;  Location: [facility] INVASIVE CV LAB;  Service: Cardiovascular;  Laterality: Left;  . REFRACTIVE SURGERY Bilateral ~ 2005   Social History:  reports that he quit smoking about 2 years ago. His smoking use included Cigarettes. He started smoking about 34 years ago. He has a 12.50 pack-year smoking history. He has never used smokeless tobacco. He reports that he uses drugs, including Cocaine, "Crack" cocaine, and Marijuana. He reports that he does not drink alcohol. Patient lives at home  Allergies  Allergen Reactions  . Vytorin [Ezetimibe-Simvastatin]     Weakness, muscle aches bad.  . Gabapentin Other (See Comments)    Twitching  . Oxycodone Nausea And Vomiting    Family History  Problem Relation Age of Onset  . Heart attack Father 28  . Hypertension Father   . Hypertension Mother   . Cancer Mother   . Lupus Mother      Prior to Admission medications   Medication Sig Start Date End Date Taking? Authorizing Provider  acetaminophen (TYLENOL) 500 MG tablet Take 500 mg by mouth every 6 (six) hours as needed.   Yes Historical Provider, MD  albuterol (PROVENTIL HFA;VENTOLIN HFA) 108 (90 BASE) MCG/ACT inhaler Inhale 2 puffs into the lungs every 6 (six) hours as needed for wheezing or shortness of breath. 04/14/15  Yes Antoine Poche, MD  amLODipine (NORVASC) 10 MG tablet Take 1 tablet (10 mg total) by mouth daily.  03/27/14  Yes Antoine Poche, MD  aspirin 81 MG chewable tablet Chew 81 mg by mouth daily.   Yes Historical Provider, MD  atorvastatin (LIPITOR) 80 MG tablet Take 1 tablet (80 mg total) by mouth daily. 08/06/15  Yes Antoine Poche, MD  calcium acetate (PHOSLO) 667 MG capsule Take 2-4 capsules by mouth 3 (three) times daily. And with snacks 02/17/15  Yes Historical Provider, MD  carvedilol (COREG) 25 MG tablet Take 37.5 mg by mouth 2 (two) times daily with a meal.   Yes Historical Provider, MD  colchicine 0.6 MG tablet Take 0.6 mg by mouth 2 (two) times daily as needed (gout).    Yes Historical Provider, MD  docusate sodium (COLACE) 100 MG capsule Take 3 mg by mouth 2 (two) times daily as needed for mild constipation.    Yes Historical Provider, MD  furosemide (LASIX) 40 MG tablet Take 120 mg by mouth daily. 02/25/15  Yes Historical Provider, MD  isosorbide mononitrate (IMDUR) 30 MG 24 hr tablet Take 1 tablet (30 mg total) by mouth 2 (two) times daily. 07/10/15  Yes Antoine Poche, MD  lidocaine-prilocaine (EMLA) cream Apply  1 application topically as needed. For port access 01/27/15  Yes Historical Provider, MD  metolazone (ZAROXOLYN) 2.5 MG tablet TAKE 1 BY MOUTH AS NEEDED  FOR WEIGHT OVER 220 POUNDS 09/25/15  Yes Antoine Poche, MD  multivitamin (RENA-VIT) TABS tablet Take 1 tablet by mouth daily.   Yes Historical Provider, MD  nitroGLYCERIN (NITROSTAT) 0.4 MG SL tablet Place 1 tablet (0.4 mg total) under the tongue every 5 (five) minutes as needed for chest pain. 10/01/15  Yes Antoine Poche, MD  diphenhydramine-acetaminophen (TYLENOL PM) 25-500 MG TABS Take 1-2 tablets by mouth at bedtime as needed (for pain and sleep).    Historical Provider, MD    Physical Exam: BP 155/83   Pulse 89   Temp 98.1 F (36.7 C) (Oral)   Resp 22   Ht 5\' 9"  (1.753 m)   Wt 215 lb (97.5 kg)   SpO2 94%   BMI 31.75 kg/m   General: Middle-aged black male. Awake and alert and oriented x3. No acute  cardiopulmonary distress.  HEENT: Normocephalic atraumatic.  Right and left ears normal in appearance.  Pupils equal, round, reactive to light. Extraocular muscles are intact. Sclerae anicteric and noninjected.  Moist mucosal membranes. No mucosal lesions.  Neck: Neck supple without lymphadenopathy. No carotid bruits. No masses palpated.  Cardiovascular: Regular rate with normal S1-S2 sounds. No murmurs, rubs, gallops auscultated. Mild JVD. 1+ edema in legs bilaterally.  Respiratory: Diminished breath sounds. No wheezes or rhonchi.  Slight rales in bases bilaterally.  No accessory muscle use. Abdomen: Soft, nontender, nondistended. Active bowel sounds. No masses or hepatosplenomegaly  Skin: No rashes, lesions, or ulcerations.  Dry, warm to touch. 2+ dorsalis pedis and radial pulses. Musculoskeletal: No calf or leg pain. All major joints not erythematous nontender.  No upper or lower joint deformation.  Good ROM.  No contractures  Psychiatric: Intact judgment and insight. Pleasant and cooperative. Neurologic: No focal neurological deficits. Strength is 5/5 and symmetric in upper and lower extremities.  Cranial nerves II through XII are grossly intact.           Labs on Admission: I have personally reviewed following labs and imaging studies  CBC:  Recent Labs Lab 01/01/16 1435  WBC 12.8*  NEUTROABS 11.5*  HGB 9.0*  HCT 28.8*  MCV 93.8  PLT 173   Basic Metabolic Panel:  Recent Labs Lab 01/01/16 1435  NA 136  K 4.2  CL 101  CO2 22  GLUCOSE 80  BUN 73*  CREATININE 13.16*  CALCIUM 8.5*   GFR: Estimated Creatinine Clearance: 8.1 mL/min (by C-G formula based on SCr of 13.16 mg/dL). Liver Function Tests:  Recent Labs Lab 01/01/16 1435  AST 14*  ALT 11*  ALKPHOS 46  BILITOT 1.0  PROT 6.9  ALBUMIN 4.0   No results for input(s): LIPASE, AMYLASE in the last 168 hours. No results for input(s): AMMONIA in the last 168 hours. Coagulation Profile: No results for input(s):  INR, PROTIME in the last 168 hours. Cardiac Enzymes:  Recent Labs Lab 01/01/16 1435  TROPONINI 0.05*   BNP (last 3 results) No results for input(s): PROBNP in the last 8760 hours. HbA1C: No results for input(s): HGBA1C in the last 72 hours. CBG: No results for input(s): GLUCAP in the last 168 hours. Lipid Profile: No results for input(s): CHOL, HDL, LDLCALC, TRIG, CHOLHDL, LDLDIRECT in the last 72 hours. Thyroid Function Tests: No results for input(s): TSH, T4TOTAL, FREET4, T3FREE, THYROIDAB in the last 72 hours. Anemia Panel: No  results for input(s): VITAMINB12, FOLATE, FERRITIN, TIBC, IRON, RETICCTPCT in the last 72 hours. Urine analysis:    Component Value Date/Time   COLORURINE YELLOW 08/10/2013 1236   APPEARANCEUR CLEAR 08/10/2013 1236   LABSPEC 1.011 08/10/2013 1236   PHURINE 7.0 08/10/2013 1236   GLUCOSEU NEGATIVE 08/10/2013 1236   HGBUR TRACE (A) 08/10/2013 1236   BILIRUBINUR NEGATIVE 08/10/2013 1236   KETONESUR NEGATIVE 08/10/2013 1236   PROTEINUR >300 (A) 08/10/2013 1236   UROBILINOGEN 0.2 08/10/2013 1236   NITRITE NEGATIVE 08/10/2013 1236   LEUKOCYTESUR NEGATIVE 08/10/2013 1236   Sepsis Labs: @LABRCNTIP (procalcitonin:4,lacticidven:4) )No results found for this or any previous visit (from the past 240 hour(s)).   Radiological Exams on Admission: Dg Chest Portable 1 View  Result Date: 01/01/2016 CLINICAL DATA:  Left-sided chest pain and shortness of Breath EXAM: PORTABLE CHEST 1 VIEW COMPARISON:  05/22/2015 FINDINGS: Cardiac shadow is stable. Bilateral fluffy infiltrates are identified likely related to underlying pulmonary edema. Central vascular congestion is noted as well. No sizable effusion is seen. IMPRESSION: Changes consistent with CHF with pulmonary edema. Electronically Signed   By: Alcide Clever M.D.   On: 01/01/2016 16:25    EKG: Independently reviewed. Sinus rhythm with right bundle branch block. No acute ST elevation or  depression.  Assessment/Plan: Principal Problem:   Upper GI bleed Active Problems:   Coronary atherosclerosis of native coronary artery   End stage renal disease (HCC)   Elevated troponin I level   SOB (shortness of breath)    This patient was discussed with the ED physician, including pertinent vitals, physical exam findings, labs, and imaging.  We also discussed care given by the ED provider.  #1 upper GI bleed  Observation to telemetry  Protonix  Clear liquids as patient has had no further episodes of emesis, then nothing by mouth after midnight  Consult GI in the morning for possible scope #2 elevated troponin I level  Repeat troponin pending  If stable, no further need for repeat troponins #3 end-stage renal disease  Dialysis tomorrow  Consults renal #4 diet-controlled diabetes  We'll need Modified diet #5 CAD #6 shortness of breath  Likely mildly fluid overloaded secondary to not receiving dialysis today  We'll give Lasix and Zaroxolyn now  Dialysis tomorrow  DVT prophylaxis: SCDs Consultants: GI, nephrology Code Status: Full code Family Communication: Mother and wife in the room  Disposition Plan: Return to home   Levie Heritage, DO [facility] Pager [phone]  If 7PM-7AM, please contact night-coverage [email or web address] Password [email or web address]

## 2016-01-02 ENCOUNTER — Encounter (HOSPITAL_COMMUNITY)
Admission: EM | Disposition: A | Discharge status: Home / Self Care | Payer: Self-pay | Source: Home / Self Care | Attending: Internal Medicine

## 2016-01-02 ENCOUNTER — Inpatient Hospital Stay (HOSPITAL_COMMUNITY): Payer: BLUE CROSS/BLUE SHIELD | Admitting: Anesthesiology

## 2016-01-02 ENCOUNTER — Encounter (HOSPITAL_COMMUNITY): Payer: Self-pay | Admitting: Gastroenterology

## 2016-01-02 DIAGNOSIS — R7989 Other specified abnormal findings of blood chemistry: Secondary | ICD-10-CM

## 2016-01-02 DIAGNOSIS — D649 Anemia, unspecified: Secondary | ICD-10-CM

## 2016-01-02 DIAGNOSIS — K922 Gastrointestinal hemorrhage, unspecified: Secondary | ICD-10-CM

## 2016-01-02 DIAGNOSIS — N186 End stage renal disease: Secondary | ICD-10-CM

## 2016-01-02 HISTORY — PX: ESOPHAGOGASTRODUODENOSCOPY (EGD) WITH PROPOFOL: SHX5813

## 2016-01-02 LAB — CBC
HCT: 25.7 % — ABNORMAL LOW (ref 39.0–52.0)
HEMOGLOBIN: 7.9 g/dL — AB (ref 13.0–17.0)
MCH: 28.7 pg (ref 26.0–34.0)
MCHC: 30.7 g/dL (ref 30.0–36.0)
MCV: 93.5 fL (ref 78.0–100.0)
Platelets: 145 10*3/uL — ABNORMAL LOW (ref 150–400)
RBC: 2.75 MIL/uL — AB (ref 4.22–5.81)
RDW: 15.7 % — ABNORMAL HIGH (ref 11.5–15.5)
WBC: 5.4 10*3/uL (ref 4.0–10.5)

## 2016-01-02 LAB — BASIC METABOLIC PANEL
ANION GAP: 14 (ref 5–15)
BUN: 84 mg/dL — ABNORMAL HIGH (ref 6–20)
CALCIUM: 8.6 mg/dL — AB (ref 8.9–10.3)
CO2: 23 mmol/L (ref 22–32)
Chloride: 99 mmol/L — ABNORMAL LOW (ref 101–111)
Creatinine, Ser: 15 mg/dL — ABNORMAL HIGH (ref 0.61–1.24)
GFR calc non Af Amer: 3 mL/min — ABNORMAL LOW (ref >60)
GFR, EST AFRICAN AMERICAN: 4 mL/min — AB (ref >60)
Glucose, Bld: 85 mg/dL (ref 65–99)
Potassium: 4.9 mmol/L (ref 3.5–5.1)
Sodium: 136 mmol/L (ref 135–145)

## 2016-01-02 LAB — RENAL FUNCTION PANEL
Albumin: 3.6 g/dL (ref 3.5–5.0)
Anion gap: 12 (ref 5–15)
BUN: 85 mg/dL — AB (ref 6–20)
CHLORIDE: 101 mmol/L (ref 101–111)
CO2: 22 mmol/L (ref 22–32)
CREATININE: 15.28 mg/dL — AB (ref 0.61–1.24)
Calcium: 8.3 mg/dL — ABNORMAL LOW (ref 8.9–10.3)
GFR calc Af Amer: 4 mL/min — ABNORMAL LOW (ref >60)
GFR, EST NON AFRICAN AMERICAN: 3 mL/min — AB (ref >60)
GLUCOSE: 86 mg/dL (ref 65–99)
POTASSIUM: 4.9 mmol/L (ref 3.5–5.1)
Phosphorus: 6.6 mg/dL — ABNORMAL HIGH (ref 2.5–4.6)
Sodium: 135 mmol/L (ref 135–145)

## 2016-01-02 LAB — FERRITIN: FERRITIN: 512 ng/mL — AB (ref 24–336)

## 2016-01-02 LAB — IRON AND TIBC
Iron: 25 ug/dL — ABNORMAL LOW (ref 45–182)
SATURATION RATIOS: 8 % — AB (ref 17.9–39.5)
TIBC: 304 ug/dL (ref 250–450)
UIBC: 279 ug/dL

## 2016-01-02 LAB — GLUCOSE, CAPILLARY
GLUCOSE-CAPILLARY: 79 mg/dL (ref 65–99)
GLUCOSE-CAPILLARY: 81 mg/dL (ref 65–99)

## 2016-01-02 SURGERY — ESOPHAGOGASTRODUODENOSCOPY (EGD) WITH PROPOFOL
Anesthesia: Monitor Anesthesia Care

## 2016-01-02 MED ORDER — PENTAFLUOROPROP-TETRAFLUOROETH EX AERO: 1.0000 "application " | INHALATION_SPRAY | CUTANEOUS | Status: DC | PRN

## 2016-01-02 MED ORDER — SODIUM CHLORIDE 0.9 % IV SOLN
INTRAVENOUS | Status: DC
  Administered 2016-01-02: 12:00:00 via INTRAVENOUS

## 2016-01-02 MED ORDER — ONDANSETRON HCL 4 MG/2ML IJ SOLN: 4.0000 mg | Freq: Once | INTRAMUSCULAR | Status: DC | PRN

## 2016-01-02 MED ORDER — SODIUM CHLORIDE 0.9 % IV SOLN: 100.0000 mL | INTRAVENOUS | Status: DC | PRN

## 2016-01-02 MED ORDER — LIDOCAINE HCL (PF) 1 % IJ SOLN: 5.0000 mL | INTRAMUSCULAR | Status: DC | PRN

## 2016-01-02 MED ORDER — PROPOFOL 10 MG/ML IV BOLUS
INTRAVENOUS | Status: AC
  Filled 2016-01-02: qty 20

## 2016-01-02 MED ORDER — LIDOCAINE VISCOUS 2 % MT SOLN
15.0000 mL | Freq: Once | OROMUCOSAL | Status: AC
  Administered 2016-01-02: 15 mL via OROMUCOSAL

## 2016-01-02 MED ORDER — EPOETIN ALFA 10000 UNIT/ML IJ SOLN
10000.0000 [IU] | INTRAMUSCULAR | Status: DC
  Administered 2016-01-02: 10000 [IU] via INTRAVENOUS
  Filled 2016-01-02 (×2): qty 1

## 2016-01-02 MED ORDER — ALTEPLASE 2 MG IJ SOLR: 2.0000 mg | Freq: Once | INTRAMUSCULAR | Status: DC | PRN

## 2016-01-02 MED ORDER — MIDAZOLAM HCL 5 MG/5ML IJ SOLN
INTRAMUSCULAR | Status: DC | PRN
  Administered 2016-01-02: 2 mg via INTRAVENOUS

## 2016-01-02 MED ORDER — SODIUM CHLORIDE 0.9 % IV SOLN
INTRAVENOUS | Status: DC
  Administered 2016-01-02: 11:00:00 via INTRAVENOUS

## 2016-01-02 MED ORDER — EPINEPHRINE HCL 0.1 MG/ML IJ SOSY
PREFILLED_SYRINGE | INTRAMUSCULAR | Status: AC
  Filled 2016-01-02: qty 10

## 2016-01-02 MED ORDER — HEPARIN SODIUM (PORCINE) 1000 UNIT/ML DIALYSIS: 1000.0000 [IU] | INTRAMUSCULAR | Status: DC | PRN

## 2016-01-02 MED ORDER — PROPOFOL 500 MG/50ML IV EMUL
INTRAVENOUS | Status: DC | PRN
  Administered 2016-01-02: 75 ug/kg/min via INTRAVENOUS

## 2016-01-02 MED ORDER — LIDOCAINE-PRILOCAINE 2.5-2.5 % EX CREA: 1.0000 "application " | TOPICAL_CREAM | CUTANEOUS | Status: DC | PRN

## 2016-01-02 MED ORDER — LIDOCAINE VISCOUS 2 % MT SOLN
OROMUCOSAL | Status: AC
  Filled 2016-01-02: qty 15

## 2016-01-02 MED ORDER — CALCIUM CARBONATE ANTACID 500 MG PO CHEW: 2.0000 | CHEWABLE_TABLET | ORAL | Status: DC | PRN

## 2016-01-02 MED ORDER — MIDAZOLAM HCL 2 MG/2ML IJ SOLN
INTRAMUSCULAR | Status: AC
  Filled 2016-01-02: qty 2

## 2016-01-02 MED ORDER — PROPOFOL 10 MG/ML IV BOLUS
INTRAVENOUS | Status: AC
Start: 2016-01-02 — End: 2016-01-02
  Filled 2016-01-02: qty 40

## 2016-01-02 NOTE — Progress Notes (Signed)
MRSA screen results positive, protocol bundle implemented.

## 2016-01-02 NOTE — Consult Note (Signed)
Referring Provider: Memory Argue* Primary Care Physician:  Louie Boston, MD Primary Gastroenterologist:  Roetta Sessions, MD  Reason for Consultation:  Hematemesis  HPI: [name] is a 45 y.o. male with history of end-stage renal disease on hemodialysis Tuesday/Thursday/Saturday, congestive heart failure with grade 2 diastolic dysfunction, CAD with previous NSTEMI X2, diet-controlled diabetes, hypertension who presented to the hospital yesterday from hemodialysis with hematemesis. Patient was not dialyzed.   Wife states that he vomited coffee grounds mixed with fresh blood. Patient reports vomiting at least 10+ times. No further vomiting today. Yesterday had some left-sided chest pain after the vomiting. Denies heartburn, dysphagia, abdominal pain. He states he has a history of abdominal hernia which bothers him at times. Bowel movements are regular. No blood in stool or melena. He was having some issues with shortness of breath on arrival. Chest x-ray with some pulmonary edema. Denies any chest pain or shortness of breath overnight.  Patient was hospitalized a couple years ago, had a colonoscopy and was diagnosed with an infection. Further details unavailable at this time. He was at [facility].  Patient quit drinking 2 years ago. Previously a heavy drinker. No drug use in over 15 years.  He takes a baby aspirin daily. No other NSAIDs. No BCs of Goody's powders.   Prior to Admission medications   Medication Sig Start Date End Date Taking? Authorizing Provider  acetaminophen (TYLENOL) 500 MG tablet Take 500 mg by mouth every 6 (six) hours as needed.   Yes Historical Provider, MD  albuterol (PROVENTIL HFA;VENTOLIN HFA) 108 (90 BASE) MCG/ACT inhaler Inhale 2 puffs into the lungs every 6 (six) hours as needed for wheezing or shortness of breath. 04/14/15  Yes Antoine Poche, MD  amLODipine (NORVASC) 10 MG tablet Take 1 tablet (10 mg total) by mouth daily. 03/27/14   Yes Antoine Poche, MD  aspirin 81 MG chewable tablet Chew 81 mg by mouth daily.   Yes Historical Provider, MD  atorvastatin (LIPITOR) 80 MG tablet Take 1 tablet (80 mg total) by mouth daily. 08/06/15  Yes Antoine Poche, MD  calcium acetate (PHOSLO) 667 MG capsule Take 2-4 capsules by mouth 3 (three) times daily. And with snacks 02/17/15  Yes Historical Provider, MD  carvedilol (COREG) 25 MG tablet Take 37.5 mg by mouth 2 (two) times daily with a meal.   Yes Historical Provider, MD  colchicine 0.6 MG tablet Take 0.6 mg by mouth 2 (two) times daily as needed (gout).    Yes Historical Provider, MD  docusate sodium (COLACE) 100 MG capsule Take 3 mg by mouth 2 (two) times daily as needed for mild constipation.    Yes Historical Provider, MD  furosemide (LASIX) 40 MG tablet Take 120 mg by mouth daily. 02/25/15  Yes Historical Provider, MD  isosorbide mononitrate (IMDUR) 30 MG 24 hr tablet Take 1 tablet (30 mg total) by mouth 2 (two) times daily. 07/10/15  Yes Antoine Poche, MD  lidocaine-prilocaine (EMLA) cream Apply 1 application topically as needed. For port access 01/27/15  Yes Historical Provider, MD  metolazone (ZAROXOLYN) 2.5 MG tablet TAKE 1 BY MOUTH AS NEEDED  FOR WEIGHT OVER 220 POUNDS 09/25/15  Yes Antoine Poche, MD  multivitamin (RENA-VIT) TABS tablet Take 1 tablet by mouth daily.   Yes Historical Provider, MD  nitroGLYCERIN (NITROSTAT) 0.4 MG SL tablet Place 1 tablet (0.4 mg total) under the tongue every 5 (five) minutes as needed for chest pain. 10/01/15  Yes Dorothe Pea  Branch, MD  diphenhydramine-acetaminophen (TYLENOL PM) 25-500 MG TABS Take 1-2 tablets by mouth at bedtime as needed (for pain and sleep).    Historical Provider, MD    Current Facility-Administered Medications  Medication Dose Route Frequency Provider Last Rate Last Dose  . acetaminophen (TYLENOL) tablet 650 mg  650 mg Oral Q6H PRN Levie Heritage, DO       Or  . acetaminophen (TYLENOL) suppository 650 mg  650 mg  Rectal Q6H PRN Rhona Raider Stinson, DO      . albuterol (PROVENTIL) (2.5 MG/3ML) 0.083% nebulizer solution 3 mL  3 mL Inhalation Q6H PRN Rhona Raider Stinson, DO      . amLODipine (NORVASC) tablet 10 mg  10 mg Oral Daily Rhona Raider Stinson, DO      . atorvastatin (LIPITOR) tablet 80 mg  80 mg Oral q1800 Rhona Raider Stinson, DO   80 mg at 01/01/16 2045  . calcium acetate (PHOSLO) capsule 2,668 mg  2,668 mg Oral TID WC Rhona Raider Stinson, DO       And  . calcium acetate (PHOSLO) capsule 1,334 mg  1,334 mg Oral PRN Rhona Raider Stinson, DO      . carvedilol (COREG) tablet 37.5 mg  37.5 mg Oral BID WC Rhona Raider Stinson, DO   37.5 mg at 01/01/16 2044  . Chlorhexidine Gluconate Cloth 2 % PADS 6 each  6 each Topical Q0600 Levie Heritage, DO   6 each at 01/02/16 5621  . docusate sodium (COLACE) capsule 100 mg  100 mg Oral BID PRN Rhona Raider Stinson, DO      . epoetin alfa (EPOGEN,PROCRIT) injection 10,000 Units  10,000 Units Intravenous Q M,W,F-HD Salomon Mast, MD      . furosemide (LASIX) tablet 120 mg  120 mg Oral Daily Rhona Raider Stinson, DO      . isosorbide mononitrate (IMDUR) 24 hr tablet 30 mg  30 mg Oral BID Rhona Raider Stinson, DO   30 mg at 01/01/16 2044  . metolazone (ZAROXOLYN) tablet 2.5 mg  2.5 mg Oral Daily Rhona Raider Stinson, DO      . mupirocin ointment (BACTROBAN) 2 % 1 application  1 application Nasal BID Levie Heritage, DO   1 application at 01/01/16 2337  . ondansetron (ZOFRAN) tablet 4 mg  4 mg Oral Q6H PRN Rhona Raider Stinson, DO       Or  . ondansetron [facility]) injection 4 mg  4 mg Intravenous Q6H PRN Rhona Raider Stinson, DO      . pantoprazole (PROTONIX) injection 40 mg  40 mg Intravenous Q12H Rhona Raider Stinson, DO   40 mg at 01/01/16 1958  . sodium chloride flush (NS) 0.9 % injection 3 mL  3 mL Intravenous Q12H Rhona Raider Stinson, DO   3 mL at 01/01/16 2046  . zolpidem (AMBIEN) tablet 5 mg  5 mg Oral QHS PRN Leanne Chang, NP   5 mg at 01/01/16 2301    Allergies as of 01/01/2016 - Review Complete 01/01/2016   Allergen Reaction Noted  . Vytorin [ezetimibe-simvastatin]  03/27/2012  . Gabapentin Other (See Comments) 10/11/2013  . Oxycodone Nausea And Vomiting 10/12/2013    Past Medical History:  Diagnosis Date  . Anemia   . Anginal pain (HCC)   . CHF (congestive heart failure) (HCC)   . CKD (chronic kidney disease) stage 3, GFR 30-59 ml/min    on dialysis M/W/F - started March 2016  . Coronary atherosclerosis of native coronary artery  100% apical LAD and distal OM1, Rx medically - 11/13  . Depression   . Essential hypertension, benign   . Gouty arthritis   . History of blood transfusion 1988   "arm went thru glass window" (10/12/2012)  . Ischemic cardiomyopathy    LVEF 40-45%  . Mixed hyperlipidemia   . NSTEMI (non-ST elevated myocardial infarction) (HCC) 02/2012 and 10/2012  . PAD (peripheral artery disease) (HCC)    Normal ABIs, 2011; focal left EIA stenosis; mild bilateral distal SFA disease, 2008  . RBBB   . Shortness of breath    when he has fluid he gets sob  . Type 2 diabetes mellitus (HCC)    diet controlled    Past Surgical History:  Procedure Laterality Date  . Arm surgery Right [id number]'s   "@ least 3 ORs; took nerves out of my left leg and put them in my arm" (10/12/2012)  . AV FISTULA PLACEMENT Left 10/16/2013   Procedure: CREATION OF LEFT ARM BASILIC TO BRACHIAL FISTULA;  Surgeon: Sherren Kernsharles E Fields, MD;  [facility];  Service: Vascular;  Laterality: Left;  . BASCILIC VEIN TRANSPOSITION Left 08/13/2014   Procedure: Left arm SECOND STAGE BASILIC VEIN TRANSPOSITION;  Surgeon: Sherren Kernsharles E Fields, MD;  [facility];  Service: Vascular;  Laterality: Left;  . CARDIAC CATHETERIZATION  03/28/12   20% prox and mid LAD, 100% distal LAD, 30% prox OM, 100% distal OM, 40% prox RCA, 40% mid RCA, 25% distal RCA, small PDA w/ 80% diffuse dz, PLB small w/ 50% diffuse stenoses. No focal lesions for PCI. Recommendations for continued medical management and aggressive RF reduction  .  [facility]  ?[facility]  . ENDARTERECTOMY FEMORAL Right 07/17/2013   Procedure: ENDARTERECTOMY FEMORAL WITH PROFUNDAPLOASTY;  Surgeon: Sherren Kernsharles E Fields, MD;  [facility];  Service: Vascular;  Laterality: Right;  . EYE SURGERY Left 1990's   "blood vessel ruptured" (10/12/2012)  . FEMORAL-POPLITEAL BYPASS GRAFT Right 07/17/2013   Procedure: BYPASS GRAFT FEMORAL-POPLITEAL ARTERY-RIGHT;  Surgeon: Sherren Kernsharles E Fields, MD;  [facility];  Service: Vascular;  Laterality: Right;  . LEFT HEART CATHETERIZATION WITH CORONARY ANGIOGRAM N/A 03/28/2012   Procedure: LEFT HEART CATHETERIZATION WITH CORONARY ANGIOGRAM;  Surgeon: Kathleene Hazelhristopher D McAlhany, MD;  [facility];  Service: Cardiovascular;  Laterality: N/A;  . LOWER EXTREMITY ANGIOGRAM Right 07/11/2013   Procedure: LOWER EXTREMITY ANGIOGRAM;  Surgeon: Fransisco HertzBrian L Chen, MD;  [facility];  Service: Cardiovascular;  Laterality: Right;  rt leg angio  . PATCH ANGIOPLASTY Right 07/17/2013   Procedure: PATCH ANGIOPLASTY OF COMMON FEMORAL AND PROFUNDA USING SEGMENT OF GREATER SAPHENOUS VEIN ;  Surgeon: Sherren Kernsharles E Fields, MD;  [facility];  Service: Vascular;  Laterality: Right;  . PERIPHERAL VASCULAR CATHETERIZATION N/A 02/28/2015   Procedure: Fistulagram;  Surgeon: Sherren Kernsharles E Fields, MD;  [facility];  Service: Cardiovascular;  Laterality: N/A;  . PERIPHERAL VASCULAR CATHETERIZATION Left 02/28/2015   Procedure: A/V Shunt Intervention;  Surgeon: Sherren Kernsharles E Fields, MD;  [facility];  Service: Cardiovascular;  Laterality: Left;  . REFRACTIVE SURGERY Bilateral ~ 2005    Family History  Problem Relation Age of Onset  . Heart attack Father 3750  . Hypertension Father   . Hypertension Mother   . Cancer Mother   . Lupus Mother     Social History   Social History  . Marital status: Married    Spouse name: N/A  . Number  of children: N/A  . Years of education: N/A    Occupational History  . Not on file.   Social History Main Topics  . Smoking status: Former Smoker    Packs/day: 0.50    Years: 25.00    Types: Cigarettes    Start date: 07/23/1981    Quit date: 07/11/2013  . Smokeless tobacco: Never Used  . Alcohol use No     Comment: 10/12/2012 "quit drinking > 10 yr ago; was an alcoholic"  . Drug use:     Types: Cocaine, "Crack" cocaine, Marijuana     Comment: former quit 2000  . Sexual activity: Yes   Other Topics Concern  . Not on file   Social History Narrative  . No narrative on file     ROS:  General: Negative for anorexia, weight loss, fever, chills, fatigue, weakness. Eyes: Negative for vision changes.  ENT: Negative for hoarseness, difficulty swallowing , nasal congestion. CV: Negative for chest pain, angina, palpitations, dyspnea on exertion, Positive peripheral edema.  Respiratory: Negative for dyspnea at rest, dyspnea on exertion, cough, sputum, wheezing.  GI: See history of present illness. GU:  Negative for dysuria, hematuria, urinary incontinence, urinary frequency, nocturnal urination.  MS: Negative for joint pain, low back pain.  Derm: Negative for rash or itching.  Neuro: Negative for weakness, abnormal sensation, seizure, frequent headaches, memory loss, confusion.  Psych: Negative for anxiety, depression, suicidal ideation, hallucinations.  Endo: Negative for unusual weight change.  Heme: Negative for bruising or bleeding. Allergy: Negative for rash or hives.       Physical Examination: Vital signs in last 24 hours: Temp:  [98.1 F (36.7 C)-99.4 F (37.4 C)] 98.7 F (37.1 C) (08/25 0600) Pulse Rate:  [80-97] 80 (08/25 0600) Resp:  [16-25] 16 (08/25 0600) BP: (149-167)/(75-92) 149/75 (08/25 0600) SpO2:  [88 %-100 %] 100 % (08/25 0600) Weight:  [215 lb (97.5 kg)-219 lb 2.2 oz (99.4 kg)] 219 lb 2.2 oz (99.4 kg) (08/24 2040) Last BM Date: 01/01/16  General: Well-nourished, well-developed in no acute distress.  Accompanied by wife Head: Normocephalic, atraumatic.   Eyes: Conjunctiva pink, no icterus. Mouth: Oropharyngeal mucosa moist and pink , no lesions erythema or exudate. Neck: Supple without thyromegaly, masses, or lymphadenopathy.  Lungs: Clear to auscultation bilaterally.  Heart: Regular rate and rhythm, no murmurs rubs or gallops.  Abdomen: Bowel sounds are normal, nontender, nondistended, no hepatosplenomegaly or masses, no abdominal bruits. Mild rectus diastases. Suspected hernia above the umbilicus with induration and fullness in this area.   Rectal: Not performed Extremities: Trace pedal edema bilaterally. No clubbing, deformity.  Neuro: Alert and oriented x 4 , grossly normal neurologically.  Skin: Warm and dry, no rash or jaundice.   Psych: Alert and cooperative, normal mood and affect.        Intake/Output from previous day: No intake/output data recorded. Intake/Output this shift: No intake/output data recorded.  Lab Results: CBC  Recent Labs  01/01/16 1435  WBC 12.8*  HGB 9.0*  HCT 28.8*  MCV 93.8  PLT 173   Hemoglobin 10.9 back in January 2017  BMET  Recent Labs  01/01/16 1435  NA 136  K 4.2  CL 101  CO2 22  GLUCOSE 80  BUN 73*  CREATININE 13.16*  CALCIUM 8.5*   LFT  Recent Labs  01/01/16 1435  BILITOT 1.0  ALKPHOS 46  AST 14*  ALT 11*  PROT 6.9  ALBUMIN 4.0    Lipase No results for input(s): LIPASE in the  last 72 hours.  PT/INR No results for input(s): LABPROT, INR in the last 72 hours.    Imaging Studies: Dg Chest Portable 1 View  Result Date: 01/01/2016 CLINICAL DATA:  Left-sided chest pain and shortness of Breath EXAM: PORTABLE CHEST 1 VIEW COMPARISON:  05/22/2015 FINDINGS: Cardiac shadow is stable. Bilateral fluffy infiltrates are identified likely related to underlying pulmonary edema. Central vascular congestion is noted as well. No sizable effusion is seen. IMPRESSION: Changes consistent with CHF with pulmonary edema.  Electronically Signed   By: Alcide Clever M.D.   On: 01/01/2016 16:25  [4 week]   Impression: 45 year old gentleman who presented from dialysis with acute onset vomiting of coffee-ground emesis mixed with fresh blood. Had some left-sided chest pain after vomiting episode. Some shortness of breath. Chest x-ray with some pulmonary edema. Last dialysis was Tuesday. Received diuretics in the ER. Clinically patient is improved this morning. Breathing is normal. Has not had any chest pain since yesterday. He had a mild bump in his troponin felt to be insignificant  Differential diagnosis for hematemesis includes reflux esophagitis, peptic ulcer disease, less likely esophageal variceal bleed given hemodynamic stability. He does have a history of alcohol abuse but no evidence of cirrhosis on CT imaging at [facility] in 2015.   Plan: 1. Upper endoscopy with propofol this morning.  I have discussed the risks, alternatives, benefits with regards to but not limited to the risk of reaction to medication, bleeding, infection, perforation and the patient is agreeable to proceed. Written consent to be obtained.  We would like to thank you for the opportunity to participate in the care of Samson L Lubke.  Leanna Battles. Dixon Boos [facility] [phone] 8/25/201710:14 AM      LOS: 1 day

## 2016-01-02 NOTE — Anesthesia Postprocedure Evaluation (Signed)
Anesthesia Post Note  Patient: [name]  Procedure(s) Performed: Procedure(s) (LRB): ESOPHAGOGASTRODUODENOSCOPY (EGD) WITH PROPOFOL (N/A)  Patient location during evaluation: PACU Anesthesia Type: MAC Level of consciousness: awake and alert and oriented Pain management: pain level controlled Vital Signs Assessment: post-procedure vital signs reviewed and stable Respiratory status: spontaneous breathing, nonlabored ventilation, respiratory function stable and patient connected to nasal cannula oxygen Cardiovascular status: stable and blood pressure returned to baseline Postop Assessment: no signs of nausea or vomiting Anesthetic complications: no    Last Vitals:  Vitals:   01/02/16 1230 01/02/16 1245  BP: 138/81 137/76  Pulse: 81 81  Resp: 18 (!) 27  Temp:      Last Pain:  Vitals:   01/02/16 1225  TempSrc:   PainSc: Asleep                 Amelia Macken A.

## 2016-01-02 NOTE — Procedures (Signed)
   HEMODIALYSIS TREATMENT NOTE:  2.25 hour heparin-free dialysis completed via left upper arm AVF (16g/antegrade).  Goal NOT met: Unable to tolerate ultrafiltration beyond 2 hours and 14 minutes due to cramping in abdomen and rib cage.  States, "I only run two hours" and that he usually signs off treatment early/AMA in outpatient setting.  Reports saline boluses and alteration of UF rate does NOT alleviate cramps.  "I just have to come off the machine."  He admits "I know I don't get a good clearance" and we discussed complications of uremia, but he states the cramps are intolerable to the point "I've thought about taking a shotgun to my head."  States Xanax has been suggested preHD just for cramps, but divulges a history of substance abuse and fear of mind altering substances.  He will try tonic water with quinine but, for now, knows no other way to deal with cramps that to end HD early.  Net UF 1.7 liters.  All blood was returned and hemostasis was achieved withini 15 minutes. Dr. Lowanda Foster was notified of early HD termination.  Report given to primary RN.  Rockwell Alexandria, RN, CDN

## 2016-01-02 NOTE — Progress Notes (Signed)
PROGRESS NOTE    [name]  FHL:[id number] DOB: 1971/02/03 DOA: 01/01/2016 PCP: Louie Boston, MD     Brief Narrative:  45 y/o man admitted from home on 8/25 with coffee-ground emesis. Admitted for GI evaluation and EGD. Also has ESRD and is due for HD on admission.   Assessment & Plan:   Principal Problem:   Upper GI bleed Active Problems:   Coronary atherosclerosis of native coronary artery   End stage renal disease (HCC)   Elevated troponin I level   SOB (shortness of breath)   Absolute anemia   Upper Gi Bleed -Continue protonix. -Await results of EGD. -Hb has been stable. -No further hematemesis since admission.  ESRD -Seen by renal with orders for HD today.  Elevated Troponin -No CP. -EKG without acute ischemic abnormalities. -Check ECHO. As long as EF normal and no WMA, do not anticipate further cardiac work up.   DVT prophylaxis: SCDs Code Status: Full Code Family Communication: Wife Steward Drone at bedside updated on plan of care and all questions answered Disposition Plan: Anticipate DC home in 24-48 hours  Consultants:   Renal  GI  Procedures:   EGD  Antimicrobials:   none    Subjective: Feels well, ambulating in room, no further hematemesis since admission.  Objective: Vitals:   01/02/16 1225 01/02/16 1230 01/02/16 1245 01/02/16 1500  BP: 134/78 138/81 137/76 132/74  Pulse:  81 81 75  Resp: 20 18 (!) 27 18  Temp: 97.9 F (36.6 C)   98.2 F (36.8 C)  TempSrc:    Oral  SpO2: 99% 98% 92% 95%  Weight:      Height:        Intake/Output Summary (Last 24 hours) at 01/02/16 1554 Last data filed at 01/02/16 1210  Gross per 24 hour  Intake              100 ml  Output                0 ml  Net              100 ml   Filed Weights   01/01/16 1412 01/01/16 2040  Weight: 97.5 kg (215 lb) 99.4 kg (219 lb 2.2 oz)    Examination:  General exam: Alert, awake, oriented x 3 Respiratory system: Clear to auscultation. Respiratory effort  normal. Cardiovascular system:RRR. No murmurs, rubs, gallops. Gastrointestinal system: Abdomen is nondistended, soft and nontender. No organomegaly or masses felt. Normal bowel sounds heard. Central nervous system: Alert and oriented. No focal neurological deficits. Extremities: No C/C/E, +pedal pulses Skin: No rashes, lesions or ulcers Psychiatry: Judgement and insight appear normal. Mood & affect appropriate.     Data Reviewed: I have personally reviewed following labs and imaging studies  CBC:  Recent Labs Lab 01/01/16 1435  WBC 12.8*  NEUTROABS 11.5*  HGB 9.0*  HCT 28.8*  MCV 93.8  PLT 173   Basic Metabolic Panel:  Recent Labs Lab 01/01/16 1435  NA 136  K 4.2  CL 101  CO2 22  GLUCOSE 80  BUN 73*  CREATININE 13.16*  CALCIUM 8.5*   GFR: Estimated Creatinine Clearance: 8.2 mL/min (by C-G formula based on SCr of 13.16 mg/dL). Liver Function Tests:  Recent Labs Lab 01/01/16 1435  AST 14*  ALT 11*  ALKPHOS 46  BILITOT 1.0  PROT 6.9  ALBUMIN 4.0   No results for input(s): LIPASE, AMYLASE in the last 168 hours. No results for input(s): AMMONIA in the last  168 hours. Coagulation Profile: No results for input(s): INR, PROTIME in the last 168 hours. Cardiac Enzymes:  Recent Labs Lab 01/01/16 1435 01/01/16 1835  TROPONINI 0.05* 0.07*   BNP (last 3 results) No results for input(s): PROBNP in the last 8760 hours. HbA1C: No results for input(s): HGBA1C in the last 72 hours. CBG:  Recent Labs Lab 01/02/16 1135 01/02/16 1233  GLUCAP 79 81   Lipid Profile: No results for input(s): CHOL, HDL, LDLCALC, TRIG, CHOLHDL, LDLDIRECT in the last 72 hours. Thyroid Function Tests: No results for input(s): TSH, T4TOTAL, FREET4, T3FREE, THYROIDAB in the last 72 hours. Anemia Panel: No results for input(s): VITAMINB12, FOLATE, FERRITIN, TIBC, IRON, RETICCTPCT in the last 72 hours. Urine analysis:    Component Value Date/Time   COLORURINE YELLOW 08/10/2013 1236    APPEARANCEUR CLEAR 08/10/2013 1236   LABSPEC 1.011 08/10/2013 1236   PHURINE 7.0 08/10/2013 1236   GLUCOSEU NEGATIVE 08/10/2013 1236   HGBUR TRACE (A) 08/10/2013 1236   BILIRUBINUR NEGATIVE 08/10/2013 1236   KETONESUR NEGATIVE 08/10/2013 1236   PROTEINUR >300 (A) 08/10/2013 1236   UROBILINOGEN 0.2 08/10/2013 1236   NITRITE NEGATIVE 08/10/2013 1236   LEUKOCYTESUR NEGATIVE 08/10/2013 1236   Sepsis Labs: @LABRCNTIP (procalcitonin:4,lacticidven:4)  ) Recent Results (from the past 240 hour(s))  MRSA PCR Screening     Status: Abnormal   Collection Time: 01/01/16  8:50 PM  Result Value Ref Range Status   MRSA by PCR POSITIVE (A) NEGATIVE Final    Comment:        The GeneXpert MRSA Assay (FDA approved for NASAL specimens only), is one component of a comprehensive MRSA colonization surveillance program. It is not intended to diagnose MRSA infection nor to guide or monitor treatment for MRSA infections. RESULT CALLED TO, READ BACK BY AND VERIFIED WITH: BELIZAIRE,W ON 01/01/16 AT 2250 BY LOY,C          Radiology Studies: Dg Chest Portable 1 View  Result Date: 01/01/2016 CLINICAL DATA:  Left-sided chest pain and shortness of Breath EXAM: PORTABLE CHEST 1 VIEW COMPARISON:  05/22/2015 FINDINGS: Cardiac shadow is stable. Bilateral fluffy infiltrates are identified likely related to underlying pulmonary edema. Central vascular congestion is noted as well. No sizable effusion is seen. IMPRESSION: Changes consistent with CHF with pulmonary edema. Electronically Signed   By: Alcide CleverMark  Lukens M.D.   On: 01/01/2016 16:25        Scheduled Meds: . amLODipine  10 mg Oral Daily  . atorvastatin  80 mg Oral q1800  . calcium acetate  2,668 mg Oral TID WC  . carvedilol  37.5 mg Oral BID WC  . Chlorhexidine Gluconate Cloth  6 each Topical Q0600  . epoetin (EPOGEN/PROCRIT) injection  10,000 Units Intravenous Q M,W,F-HD  . furosemide  120 mg Oral Daily  . isosorbide mononitrate  30 mg Oral BID   . metolazone  2.5 mg Oral Daily  . mupirocin ointment  1 application Nasal BID  . pantoprazole (PROTONIX) IV  40 mg Intravenous Q12H  . sodium chloride flush  3 mL Intravenous Q12H   Continuous Infusions:    LOS: 1 day    Time spent: 25 minutes.. Greater than 50% of this time was spent in direct contact with the patient coordinating care.     Chaya JanHERNANDEZ ACOSTA,ESTELA, MD [facility] Pager [phone]  If 7PM-7AM, please contact night-coverage [email or web address] Password [email or web address] 01/02/2016, 3:54 PM

## 2016-01-02 NOTE — Anesthesia Preprocedure Evaluation (Addendum)
Anesthesia Evaluation  Patient identified by MRN, date of birth, ID band Patient awake    Reviewed: Allergy & Precautions, NPO status , Patient's Chart, lab work & pertinent test results, reviewed documented beta blocker date and time   Airway Mallampati: II  TM Distance: >3 FB Neck ROM: Full    Dental  (+) Missing, Poor Dentition,    Pulmonary shortness of breath and with exertion, former smoker,    Pulmonary exam normal breath sounds clear to auscultation       Cardiovascular hypertension, Pt. on medications and Pt. on home beta blockers + angina with exertion + CAD, + Past MI, + Peripheral Vascular Disease and +CHF  Normal cardiovascular exam+ dysrhythmias  Rhythm:Regular Rate:Normal  LVEF 40-45% Echo-11/07/2013 Left ventricle: The cavity size was mildly dilated. Wall thickness was increased in a pattern of mild LVH. Systolic function was normal. The estimated ejection fraction was approximately 55%. Features are consistent with a pseudonormal left ventricular filling pattern, with concomitant abnormal relaxation and increased filling pressure (grade 2 diastolic dysfunction). Doppler parameters are consistent with high ventricular filling pressure. - Regional wall motion abnormality: Moderate hypokinesis of the basal inferior and mid inferolateral myocardium; mild hypokinesis of the mid inferior, basal-mid anterolateral, and apical lateral myocardium. - Aortic valve: Mildly thickened leaflets. - Mitral valve: Mildly thickened leaflets . There was trivial regurgitation. - Tricuspid valve: There was trivial regurgitation. - Pulmonic valve: There was trivial regurgitation Nuclear Stress Test-02/2014 Perfusion: Moderate sized are of severely decreased uptake on stress and rest images in the inferior wall extending from the apex to base, indicative of prio infarction.  Wall Motion: Severe inferior wall  hypokinesis. Mild left ventricular dilation at stress and rest.    Neuro/Psych PSYCHIATRIC DISORDERS Depression    GI/Hepatic Neg liver ROS, GI bleed    Endo/Other  diabetes, Well Controlled, Type 2, Oral Hypoglycemic AgentsObesity  Hyperlipidemia  Renal/GU ESRF and DialysisRenal disease  negative genitourinary   Musculoskeletal  (+) Arthritis , Osteoarthritis,    Abdominal (+) + obese,   Peds  Hematology  (+) anemia ,   Anesthesia Other Findings   Reproductive/Obstetrics                              Chemistry      Component Value Date/Time   NA 136 01/01/2016 1435   K 4.2 01/01/2016 1435   CL 101 01/01/2016 1435   CO2 22 01/01/2016 1435   BUN 73 (H) 01/01/2016 1435   CREATININE 13.16 (H) 01/01/2016 1435      Component Value Date/Time   CALCIUM 8.5 (L) 01/01/2016 1435   ALKPHOS 46 01/01/2016 1435   AST 14 (L) 01/01/2016 1435   ALT 11 (L) 01/01/2016 1435   BILITOT 1.0 01/01/2016 1435     Lab Results  Component Value Date   WBC 12.8 (H) 01/01/2016   HGB 9.0 (L) 01/01/2016   HCT 28.8 (L) 01/01/2016   MCV 93.8 01/01/2016   PLT 173 01/01/2016   EKG: normal sinus rhythm, RBBB, LPFB.  Anesthesia Physical Anesthesia Plan  ASA: IV  Anesthesia Plan: MAC   Post-op Pain Management:    Induction:   Airway Management Planned: Natural Airway, Nasal Cannula and Simple Face Mask  Additional Equipment:   Intra-op Plan:   Post-operative Plan:   Informed Consent: I have reviewed the patients History and Physical, chart, labs and discussed the procedure including the risks, benefits and alternatives for the proposed anesthesia  with the patient or authorized representative who has indicated his/her understanding and acceptance.   Dental advisory given  Plan Discussed with: CRNA, Anesthesiologist and Surgeon  Anesthesia Plan Comments:        Anesthesia Quick Evaluation

## 2016-01-02 NOTE — Transfer of Care (Signed)
Immediate Anesthesia Transfer of Care Note  Patient: Donneta Romberg  Procedure(s) Performed: Procedure(s): ESOPHAGOGASTRODUODENOSCOPY (EGD) WITH PROPOFOL (N/A)  Patient Location: PACU  Anesthesia Type:MAC  Level of Consciousness: sedated  Airway & Oxygen Therapy: Patient Spontanous Breathing and Patient connected to face mask oxygen  Post-op Assessment: Report given to RN and Post -op Vital signs reviewed and stable  Post vital signs: Reviewed and stable  Last Vitals:  Vitals:   01/02/16 1135 01/02/16 1140  BP: (!) 158/86   Pulse:    Resp: 17 20  Temp:      Last Pain:  Vitals:   01/02/16 1127  TempSrc: Oral  PainSc:       Patients Stated Pain Goal: 8 (01/02/16 1127)  Complications: No apparent anesthesia complications

## 2016-01-02 NOTE — Op Note (Signed)
[name], REDUS                  ACCOUNT NO.:  [id number]  MEDICAL RECORD NO.:  [id number]  LOCATION:  [facility]                          FACILITY:  [facility]  PHYSICIAN:  R. Roetta Sessions, MD FACP FACGDATE OF BIRTH:  11-18-1970  DATE OF PROCEDURE: DATE OF DISCHARGE:                              OPERATIVE REPORT   PROCEDURE PERFORMED:  EGD with gastric biopsy.  INDICATIONS FOR PROCEDURE:  A 45 year old [place] American male with multiple medical problems, being evaluated for coffee-ground emesis and mild decline in hemoglobin.  EGD is now being performed.  It is notable with our probation endoscopy system has failed this morning.  No photographs were taken.  Informed consent was obtained from the patient and patient's spouse. Risks, benefits, limitations, alternatives have been reviewed.  PROCEDURE NOTE:  Patient was placed in left lateral decubitus position. Deep sedation provided by Dr. [facility].  INSTRUMENT:  Pentax video chip system.  FINDINGS:  Examination of tubular esophagus revealed entirely normal- appearing esophageal mucosa.  No varices, Mallory-Weiss tear or other abnormality observed.  Stomach empty.  The patient had multiple antral erosions with patchy erythema and granularity present predominantly in the antrum.  No ulcer infiltrating process.  No blood in the stomach. No ulcer or varices observed.  Patent pylorus.  Examination of the bulb second and third portion revealed no abnormalities.  No blood in the upper GI tract.  Therapeutic/diagnostic maneuvers performed:  Antral biopsies for histology obtained.  The patient tolerated procedure well.  Was taken to PACU in stable condition.  IMPRESSION: 1. Normal esophagus. 2. Antral erosions of uncertain significance, status post biopsy.     Remainder of the upper GI tract appeared normal. 3. Suspect relatively trivial GI bleed.  RECOMMENDATIONS: 1. Clear liquid diet. 2. Follow up on pathology. 3.  Continue acid suppression therapy empirically.     Jonathon Bellows, MD FACP [facility]     RMR/MEDQ  D:  01/02/2016  T:  01/02/2016  Job:  [id number]  cc:   R. Roetta Sessions, MD FACP [facility] [address] [place] [address]  Jorja Loa, M.D. Fax: [phone]  Wyvonnia Lora, MD Fax: [phone]

## 2016-01-02 NOTE — Consult Note (Signed)
Reason for Consult: End-stage renal disease on hemodialysis Referring Physician: Dr. Jolene Provost is an 45 y.o. male.  HPI: He is he patient was history of hypertension, ischemic cardiomyopathy, peripheral vascular disease and history of end-stage renal disease presently came with complaints of nausea and coffee-ground vomitus. Initially patient came to dialysis unit yesterday and he had an episode of coffee-ground vomitus. Patient was also complaining some abdominal pain otherwise feels okay. Patient was sent to emergency room before he was put on dialysis. Presently he said his feeling much better. He denies any blood in his stool  Past Medical History:  Diagnosis Date  . Anemia   . Anginal pain ([place])   . CHF (congestive heart failure) ([place])   . CKD (chronic kidney disease) stage 3, GFR 30-59 ml/min    on dialysis M/W/F - started March 2016  . Coronary atherosclerosis of native coronary artery    100% apical LAD and distal OM1, Rx medically - 11/13  . Depression   . Essential hypertension, benign   . Gouty arthritis   . History of blood transfusion 1988   "arm went thru glass window" (10/12/2012)  . Ischemic cardiomyopathy    LVEF 40-45%  . Mixed hyperlipidemia   . NSTEMI (non-ST elevated myocardial infarction) ([place]) 02/2012 and 10/2012  . PAD (peripheral artery disease) ([place])    Normal ABIs, 2011; focal left EIA stenosis; mild bilateral distal SFA disease, 2008  . RBBB   . Shortness of breath    when he has fluid he gets sob  . Type 2 diabetes mellitus ([facility])    diet controlled    Past Surgical History:  Procedure Laterality Date  . Arm surgery Right [phone]   "@ least 3 ORs; took nerves out of my left leg and put them in my arm" (10/12/2012)  . AV FISTULA PLACEMENT Left 10/16/2013   Procedure: CREATION OF LEFT ARM BASILIC TO BRACHIAL FISTULA;  Surgeon: Elam Dutch, MD;  Location: [place];  Service: Vascular;  Laterality: Left;  . BASCILIC VEIN TRANSPOSITION Left  08/13/2014   Procedure: Left arm SECOND STAGE BASILIC VEIN TRANSPOSITION;  Surgeon: Elam Dutch, MD;  Location: [place];  Service: Vascular;  Laterality: Left;  . CARDIAC CATHETERIZATION  03/28/12   20% prox and mid LAD, 100% distal LAD, 30% prox OM, 100% distal OM, 40% prox RCA, 40% mid RCA, 25% distal RCA, small PDA w/ 80% diffuse dz, PLB small w/ 50% diffuse stenoses. No focal lesions for PCI. Recommendations for continued medical management and aggressive RF reduction  . COLONOSCOPY    . ENDARTERECTOMY FEMORAL Right 07/17/2013   Procedure: ENDARTERECTOMY FEMORAL WITH PROFUNDAPLOASTY;  Surgeon: Elam Dutch, MD;  Location: [place];  Service: Vascular;  Laterality: Right;  . EYE SURGERY Left 1990's   "blood vessel ruptured" (10/12/2012)  . FEMORAL-POPLITEAL BYPASS GRAFT Right 07/17/2013   Procedure: BYPASS GRAFT FEMORAL-POPLITEAL ARTERY-RIGHT;  Surgeon: Elam Dutch, MD;  Location: [place];  Service: Vascular;  Laterality: Right;  . LEFT HEART CATHETERIZATION WITH CORONARY ANGIOGRAM N/A 03/28/2012   Procedure: LEFT HEART CATHETERIZATION WITH CORONARY ANGIOGRAM;  Surgeon: Burnell Blanks, MD;  Location[facility] CATH LAB;  Service: Cardiovascular;  Laterality: N/A;  . LOWER EXTREMITY ANGIOGRAM Right 07/11/2013   Procedure: LOWER EXTREMITY ANGIOGRAM;  Surgeon: Conrad Hoot Owl, MD;  Location[facility] CATH LAB;  Service: Cardiovascular;  Laterality: Right;  rt leg angio  . PATCH ANGIOPLASTY Right 07/17/2013   Procedure: PATCH ANGIOPLASTY OF COMMON FEMORAL AND  PROFUNDA USING SEGMENT OF GREATER SAPHENOUS VEIN ;  Surgeon: Elam Dutch, MD;  Location: [place];  Service: Vascular;  Laterality: Right;  . PERIPHERAL VASCULAR CATHETERIZATION N/A 02/28/2015   Procedure: Fistulagram;  Surgeon: Elam Dutch, MD;  Location: [place] CV LAB;  Service: Cardiovascular;  Laterality: N/A;  . PERIPHERAL VASCULAR CATHETERIZATION Left 02/28/2015   Procedure: A/V Shunt Intervention;  Surgeon: Elam Dutch, MD;   Location: [place] CV LAB;  Service: Cardiovascular;  Laterality: Left;  . REFRACTIVE SURGERY Bilateral ~ 2005    Family History  Problem Relation Age of Onset  . Heart attack Father 54  . Hypertension Father   . Hypertension Mother   . Cancer Mother   . Lupus Mother     Social History:  reports that he quit smoking about 2 years ago. His smoking use included Cigarettes. He started smoking about 34 years ago. He has a 12.50 pack-year smoking history. He has never used smokeless tobacco. He reports that he uses drugs, including Cocaine, "Crack" cocaine, and Marijuana. He reports that he does not drink alcohol.  Allergies:  Allergies  Allergen Reactions  . Vytorin [Ezetimibe-Simvastatin]     Weakness, muscle aches bad.  . Gabapentin Other (See Comments)    Twitching  . Oxycodone Nausea And Vomiting    Medications: I have reviewed the patient's current medications.  Results for orders placed or performed during the hospital encounter of 01/01/16 (from the past 48 hour(s))  CBC with Differential     Status: Abnormal   Collection Time: 01/01/16  2:35 PM  Result Value Ref Range   WBC 12.8 (H) 4.0 - 10.5 K/uL   RBC 3.07 (L) 4.22 - 5.81 MIL/uL   Hemoglobin 9.0 (L) 13.0 - 17.0 g/dL   HCT 28.8 (L) 39.0 - 52.0 %   MCV 93.8 78.0 - 100.0 fL   MCH 29.3 26.0 - 34.0 pg   MCHC 31.3 30.0 - 36.0 g/dL   RDW 15.7 (H) 11.5 - 15.5 %   Platelets 173 150 - 400 K/uL   Neutrophils Relative % 91 %   Neutro Abs 11.5 (H) 1.7 - 7.7 K/uL   Lymphocytes Relative 4 %   Lymphs Abs 0.6 (L) 0.7 - 4.0 K/uL   Monocytes Relative 5 %   Monocytes Absolute 0.6 0.1 - 1.0 K/uL   Eosinophils Relative 0 %   Eosinophils Absolute 0.0 0.0 - 0.7 K/uL   Basophils Relative 0 %   Basophils Absolute 0.0 0.0 - 0.1 K/uL  Troponin I     Status: Abnormal   Collection Time: 01/01/16  2:35 PM  Result Value Ref Range   Troponin I 0.05 (HH) <0.03 ng/mL    Comment: CRITICAL RESULT CALLED TO, READ BACK BY AND VERIFIED  WITH: MINTER,R AT 1600 ON 01/01/2016 BY ISLEY,B   Comprehensive metabolic panel     Status: Abnormal   Collection Time: 01/01/16  2:35 PM  Result Value Ref Range   Sodium 136 135 - 145 mmol/L   Potassium 4.2 3.5 - 5.1 mmol/L   Chloride 101 101 - 111 mmol/L   CO2 22 22 - 32 mmol/L   Glucose, Bld 80 65 - 99 mg/dL   BUN 73 (H) 6 - 20 mg/dL   Creatinine, Ser 13.16 (H) 0.61 - 1.24 mg/dL   Calcium 8.5 (L) 8.9 - 10.3 mg/dL   Total Protein 6.9 6.5 - 8.1 g/dL   Albumin 4.0 3.5 - 5.0 g/dL   AST 14 (L) 15 -  41 U/L   ALT 11 (L) 17 - 63 U/L   Alkaline Phosphatase 46 38 - 126 U/L   Total Bilirubin 1.0 0.3 - 1.2 mg/dL   GFR calc non Af Amer 4 (L) >60 mL/min   GFR calc Af Amer 5 (L) >60 mL/min    Comment: (NOTE) The eGFR has been calculated using the CKD EPI equation. This calculation has not been validated in all clinical situations. eGFR's persistently <60 mL/min signify possible Chronic Kidney Disease.    Anion gap 13 5 - 15  Troponin I     Status: Abnormal   Collection Time: 01/01/16  6:35 PM  Result Value Ref Range   Troponin I 0.07 (HH) <0.03 ng/mL    Comment: CRITICAL RESULT CALLED TO, READ BACK BY AND VERIFIED WITH: NORMAN,B AT 1925 ON 01/01/2016 BY ISLEY,B   MRSA PCR Screening     Status: Abnormal   Collection Time: 01/01/16  8:50 PM  Result Value Ref Range   MRSA by PCR POSITIVE (A) NEGATIVE    Comment:        The GeneXpert MRSA Assay (FDA approved for NASAL specimens only), is one component of a comprehensive MRSA colonization surveillance program. It is not intended to diagnose MRSA infection nor to guide or monitor treatment for MRSA infections. RESULT CALLED TO, READ BACK BY AND VERIFIED WITH: BELIZAIRE,W ON 01/01/16 AT 2250 BY LOY,C     Dg Chest Portable 1 View  Result Date: 01/01/2016 CLINICAL DATA:  Left-sided chest pain and shortness of Breath EXAM: PORTABLE CHEST 1 VIEW COMPARISON:  05/22/2015 FINDINGS: Cardiac shadow is stable. Bilateral fluffy infiltrates  are identified likely related to underlying pulmonary edema. Central vascular congestion is noted as well. No sizable effusion is seen. IMPRESSION: Changes consistent with CHF with pulmonary edema. Electronically Signed   By: Inez Catalina M.D.   On: 01/01/2016 16:25    Review of Systems  Respiratory: Positive for shortness of breath. Negative for hemoptysis.   Cardiovascular: Negative for chest pain and leg swelling.  Gastrointestinal: Positive for abdominal pain and vomiting. Negative for blood in stool and nausea.       Patient with coffee-ground vomitus   Blood pressure (!) 149/75, pulse 80, temperature 98.7 F (37.1 C), temperature source Oral, resp. rate 16, height 5' 9" (1.753 m), weight 99.4 kg (219 lb 2.2 oz), SpO2 100 %. Physical Exam  Constitutional: He is oriented to person, place, and time. No distress.  Eyes: No scleral icterus.  Neck: No JVD present.  Cardiovascular: Normal rate and regular rhythm.   No murmur heard. Respiratory: No respiratory distress. He has no wheezes.  GI: He exhibits no distension. There is no tenderness.  Musculoskeletal: He exhibits no edema.  Neurological: He is alert and oriented to person, place, and time.    Assessment/Plan: Problem #1 upper GI bleeding: Presently source of bleeding is not clear. Patient feels much better today. At this moment uremia may play some role as patient doesn't complete his treatment regularly. Problem #2 end-stage renal disease: His last dialysis was on Tuesday. He came yesterday but was unable to dialyze because of the above problem. Patient does not complete his treatment. Most of the time he gets 2 hours & signs of because of abdominal pain with etiology is not clear. Patient claims his abdominal pain runs in his family. Problem #3 fluid management: Presently patient with cardio megaly and sign of fluid overload. But he denies any difficulty breathing. Problem #4 history of hypertension: His  blood pressure is  reasonably controlled Problem #5 anemia: His ongoing is below our target goal Problem #6 history of ischemic cardiomyopathy with low ejection fraction of 40-45%. Problem #7 peripheral artery disease Plan: We'll make arrangement for patient to get dialysis today 2] I advised him that he needs to complete his treatment so has to remove fluid and also possibly help him from complication of uremia 3] we'll check renal panel and CBC in the morning. 4] we'll hold heparin and use Epogen during dialysis 5] we'll try to remove 3 L if patient can't stay for his whole treatment.  BEFEKADU,BELAYENH S 01/02/2016, 8:20 AM

## 2016-01-02 NOTE — Addendum Note (Signed)
Addendum  created 01/02/16 1304 by Mal Amabile, MD   Anesthesia Event edited

## 2016-01-03 LAB — CBC
HEMATOCRIT: 25.4 % — AB (ref 39.0–52.0)
Hemoglobin: 8 g/dL — ABNORMAL LOW (ref 13.0–17.0)
MCH: 29.4 pg (ref 26.0–34.0)
MCHC: 31.5 g/dL (ref 30.0–36.0)
MCV: 93.4 fL (ref 78.0–100.0)
PLATELETS: 142 10*3/uL — AB (ref 150–400)
RBC: 2.72 MIL/uL — ABNORMAL LOW (ref 4.22–5.81)
RDW: 15.5 % (ref 11.5–15.5)
WBC: 6.8 10*3/uL (ref 4.0–10.5)

## 2016-01-03 LAB — RENAL FUNCTION PANEL
Albumin: 3.7 g/dL (ref 3.5–5.0)
Anion gap: 8 (ref 5–15)
BUN: 67 mg/dL — ABNORMAL HIGH (ref 6–20)
CHLORIDE: 99 mmol/L — AB (ref 101–111)
CO2: 25 mmol/L (ref 22–32)
CREATININE: 12.68 mg/dL — AB (ref 0.61–1.24)
Calcium: 8.2 mg/dL — ABNORMAL LOW (ref 8.9–10.3)
GFR, EST AFRICAN AMERICAN: 5 mL/min — AB (ref >60)
GFR, EST NON AFRICAN AMERICAN: 4 mL/min — AB (ref >60)
Glucose, Bld: 134 mg/dL — ABNORMAL HIGH (ref 65–99)
POTASSIUM: 4.5 mmol/L (ref 3.5–5.1)
Phosphorus: 5.8 mg/dL — ABNORMAL HIGH (ref 2.5–4.6)
Sodium: 132 mmol/L — ABNORMAL LOW (ref 135–145)

## 2016-01-03 LAB — HEPATITIS B SURFACE ANTIGEN: Hepatitis B Surface Ag: NEGATIVE

## 2016-01-03 LAB — HEPATITIS C ANTIBODY: HCV Ab: <0.1 s/co ratio (ref 0.0–0.9)

## 2016-01-03 MED ORDER — PANTOPRAZOLE SODIUM 40 MG PO TBEC: 40.0000 mg | DELAYED_RELEASE_TABLET | Freq: Two times a day (BID) | ORAL | 2 refills | Status: DC

## 2016-01-03 NOTE — Progress Notes (Signed)
Patient wants to eat and go home. No complaints. Discussed with nursing staff. No evidence of bleeding overnight.  Vital signs in last 24 hours: Temp:  [97.9 F (36.6 C)-98.6 F (37 C)] 98.6 F (37 C) (08/26 0539) Pulse Rate:  [75-85] 84 (08/26 0539) Resp:  [16-27] 20 (08/26 0539) BP: (132-175)/(74-94) 148/77 (08/26 0539) SpO2:  [92 %-100 %] 92 % (08/26 0539) Weight:  [220 lb 14.4 oz (100.2 kg)-225 lb 4.8 oz (102.2 kg)] 225 lb 4.8 oz (102.2 kg) (08/26 0539) Last BM Date: 01/02/16 General:    pleasant and cooperative in NAD Abdomen:  Nondistended. Positive bowel sounds soft and nontender Extremities:  Without clubbing or edema.    Intake/Output from previous day: 08/25 0701 - 08/26 0700 In: 340 [P.O.:240; I.V.:100] Out: 1716  Intake/Output this shift: No intake/output data recorded.  Lab Results:  Recent Labs  01/01/16 1435 01/02/16 1714  WBC 12.8* 5.4  HGB 9.0* 7.9*  HCT 28.8* 25.7*  PLT 173 145*   BMET  Recent Labs  01/01/16 1435 01/02/16 1714  NA 136 135  136  K 4.2 4.9  4.9  CL 101 101  99*  CO2 22 22  23   GLUCOSE 80 86  85  BUN 73* 85*  84*  CREATININE 13.16* 15.28*  15.00*  CALCIUM 8.5* 8.3*  8.6*   LFT  Recent Labs  01/01/16 1435 01/02/16 1714  PROT 6.9  --   ALBUMIN 4.0 3.6  AST 14*  --   ALT 11*  --   ALKPHOS 46  --   BILITOT 1.0  --    PT/INR No results for input(s): LABPROT, INR in the last 72 hours. Hepatitis Panel  Recent Labs  01/02/16 1714 01/02/16 1730  HEPBSAG  --  Negative  HCVAB <0.1  --    C-Diff No results for input(s): CDIFFTOX in the last 72 hours.  Studies/Results: Dg Chest Portable 1 View  Result Date: 01/01/2016 CLINICAL DATA:  Left-sided chest pain and shortness of Breath EXAM: PORTABLE CHEST 1 VIEW COMPARISON:  05/22/2015 FINDINGS: Cardiac shadow is stable. Bilateral fluffy infiltrates are identified likely related to underlying pulmonary edema. Central vascular congestion is noted as well. No sizable  effusion is seen. IMPRESSION: Changes consistent with CHF with pulmonary edema. Electronically Signed   By: Alcide Clever M.D.   On: 01/01/2016 16:25   Eula Listen  01/03/2016, 8:58 AM   Impression:  44 year old gentleman multiple medical problems admitted with coffee ground emesis. Patient had multiple notable antral erosions on EGD yesterday. However, no ulcer or other significant lesion found to explain coffee ground emesis.  Hemoglobin down to 7.9 on recheck yesterday. This may be multifactorial in etiology. Clinically, does not had any bleeding since yesterday.  Iron studies reveal an elevated ferritin, depressed saturation of 8%.  Need to review his last colonoscopy report (procedure done elsewhere) to help determine whether or not further outpatient GI evaluation warranted.   Recommendations:  Advance to a clear liquid diet for lunch and on to a renal diet afterwards if tolerated  Continue twice a day PPI for now-at least until seen back in the office by [place]  Follow up on pathology  (gastric biopsy)  Recheck H&H today  We'll arrange outpatient follow-up with [place] in about 2 weeks.  If he continues to remain stable, I would anticipate discharge within the next 24 hours from a GI standpoint.

## 2016-01-03 NOTE — Progress Notes (Signed)
Subjective: Interval History: Has no complaints of nausea or vomiting. Patient also denies any abdominal pain or feels much better. Objective: Vital signs in last 24 hours: Temp:  [97.9 F (36.6 C)-98.6 F (37 C)] 98.6 F (37 C) (08/26 0539) Pulse Rate:  [75-85] 84 (08/26 0539) Resp:  [16-27] 20 (08/26 0539) BP: (132-175)/(74-94) 148/77 (08/26 0539) SpO2:  [92 %-100 %] 92 % (08/26 0539) Weight:  [100.2 kg (220 lb 14.4 oz)-102.2 kg (225 lb 4.8 oz)] 102.2 kg (225 lb 4.8 oz) (08/26 0539) Weight change: 2.677 kg (5 lb 14.4 oz)  Intake/Output from previous day: 08/25 0701 - 08/26 0700 In: 340 [P.O.:240; I.V.:100] Out: 1716  Intake/Output this shift: No intake/output data recorded.  General appearance: alert, cooperative and no distress Resp: clear to auscultation bilaterally Cardio: regular rate and rhythm, S1, S2 normal, no murmur, click, rub or gallop GI: soft, non-tender; bowel sounds normal; no masses,  no organomegaly Extremities: extremities normal, atraumatic, no cyanosis or edema  Lab Results:  Recent Labs  01/01/16 1435 01/02/16 1714  WBC 12.8* 5.4  HGB 9.0* 7.9*  HCT 28.8* 25.7*  PLT 173 145*   BMET:  Recent Labs  01/01/16 1435 01/02/16 1714  NA 136 135  136  K 4.2 4.9  4.9  CL 101 101  99*  CO2 22 22  23   GLUCOSE 80 86  85  BUN 73* 85*  84*  CREATININE 13.16* 15.28*  15.00*  CALCIUM 8.5* 8.3*  8.6*   No results for input(s): PTH in the last 72 hours. Iron Studies:  Recent Labs  01/02/16 1714  IRON 25*  TIBC 304  FERRITIN 512*    Studies/Results: Dg Chest Portable 1 View  Result Date: 01/01/2016 CLINICAL DATA:  Left-sided chest pain and shortness of Breath EXAM: PORTABLE CHEST 1 VIEW COMPARISON:  05/22/2015 FINDINGS: Cardiac shadow is stable. Bilateral fluffy infiltrates are identified likely related to underlying pulmonary edema. Central vascular congestion is noted as well. No sizable effusion is seen. IMPRESSION: Changes consistent  with CHF with pulmonary edema. Electronically Signed   By: Alcide Clever M.D.   On: 01/01/2016 16:25    I have reviewed the patient's current medications.  Assessment/Plan: Problem #1hematemesis. Patient had endoscopy he has some antral erosion otherwise no bleeding. Presently patient is asymptomatic. 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 Problem #2 end-stage renal disease: He status post hemodialysis yesterday. As usual patient had only 2 and half hours of dialysis. Presently his potassium is good. Problem #3 hypertension: His blood pressure is reasonable controlled Problem #4 anemia: His hemoglobin has declined.  Problem #5 metabolic bone disease: His calcium is range but his phosphorus is high. Problem #6 fluid management: Patient presently denies any difficulty breathing. The chest x-ray flow some central congestion. Will review to remove about 1.7 L yesterday. Fluid removal is always very difficult for him because of continuous cramp which comes after 2 hours of dialysis every time. Plan: Patient does require dialysis today and his next dialysis would be on Monday which is his regular schedule. Could be done as an outpatient if patient is discharged.  Leigh Kaeding S 01/03/2016,7:26 AM

## 2016-01-03 NOTE — Discharge Summary (Signed)
Physician Discharge Summary  [name] JXB:[id number] DOB: 1970-07-02 DOA: 01/01/2016  PCP: Louie Boston, MD  Admit date: 01/01/2016 Discharge date: 01/03/2016  Time spent: 45 minutes  Recommendations for Outpatient Follow-up:  -Will be discharged home today. -Advised to follow up with PCP as scheduled. -Advised to follow up with his HD unit as scheduled.   Discharge Diagnoses:  Principal Problem:   Upper GI bleed Active Problems:   Coronary atherosclerosis of native coronary artery   End stage renal disease (HCC)   Elevated troponin I level   SOB (shortness of breath)   Absolute anemia   Discharge Condition: Stable and improved  Filed Weights   01/01/16 2040 01/02/16 1640 01/03/16 0539  Weight: 99.4 kg (219 lb 2.2 oz) 100.2 kg (220 lb 14.4 oz) 102.2 kg (225 lb 4.8 oz)    History of present illness:  As per Dr. Adrian Blackwater on 8/24: [name] is a 45 y.o. male with a history of end-stage renal disease on hemodialysis Monday, Wednesday, Friday, congestive heart failure with grade 2 diastolic dysfunction, CAD U with NSTEMI 2, essential hypertension, diet-controlled diabetes. Patient presented for dialysis today than had emesis that was described as coffee-ground. Family members also stated that she had some blood in his emesis. Patient had nonradiating, mild chest discomfort in the left breast at the same time, which is intermittent now and improving. He states that his chest pain is different from his traditional chest pain with his previous MIs. No previous episodes of hematemesis.  Hospital Course:   Upper Gi Bleed -Continue protonix BID per GI recommendations. -EGD with antral erosions. -Hb has been stable. -No further hematemesis since admission. -Patient anxious to go home and is not willing to stay to advance diet.  ESRD -Seen by renal with orders for HD today.  Elevated Troponin -No CP. -EKG without acute ischemic abnormalities. -ECHO pending. Given  lack of symptoms I believe it is ok to DC without results.  Procedures: EGD 8/25: IMPRESSION: 1. Normal esophagus. 2. Antral erosions of uncertain significance, status post biopsy.     Remainder of the upper GI tract appeared normal.  3. Suspect relatively trivial GI bleed.  Consultations:  GI  Renal  Discharge Instructions  Discharge Instructions    Diet - low sodium heart healthy    Complete by:  As directed   Increase activity slowly    Complete by:  As directed       Medication List    STOP taking these medications   acetaminophen 500 MG tablet Commonly known as:  TYLENOL   aspirin 81 MG chewable tablet     TAKE these medications   albuterol 108 (90 Base) MCG/ACT inhaler Commonly known as:  PROVENTIL HFA;VENTOLIN HFA Inhale 2 puffs into the lungs every 6 (six) hours as needed for wheezing or shortness of breath.   amLODipine 10 MG tablet Commonly known as:  NORVASC Take 1 tablet (10 mg total) by mouth daily.   atorvastatin 80 MG tablet Commonly known as:  LIPITOR Take 1 tablet (80 mg total) by mouth daily.   calcium acetate 667 MG capsule Commonly known as:  PHOSLO Take 2-4 capsules by mouth 3 (three) times daily. And with snacks   carvedilol 25 MG tablet Commonly known as:  COREG Take 37.5 mg by mouth 2 (two) times daily with a meal.   colchicine 0.6 MG tablet Take 0.6 mg by mouth 2 (two) times daily as needed (gout).   diphenhydramine-acetaminophen 25-500 MG Tabs  tablet Commonly known as:  TYLENOL PM Take 1-2 tablets by mouth at bedtime as needed (for pain and sleep).   docusate sodium 100 MG capsule Commonly known as:  COLACE Take 3 mg by mouth 2 (two) times daily as needed for mild constipation.   furosemide 40 MG tablet Commonly known as:  LASIX Take 120 mg by mouth daily.   isosorbide mononitrate 30 MG 24 hr tablet Commonly known as:  IMDUR Take 1 tablet (30 mg total) by mouth 2 (two) times daily.   lidocaine-prilocaine  cream Commonly known as:  EMLA Apply 1 application topically as needed. For port access   metolazone 2.5 MG tablet Commonly known as:  ZAROXOLYN TAKE 1 BY MOUTH AS NEEDED  FOR WEIGHT OVER 220 POUNDS   multivitamin Tabs tablet Take 1 tablet by mouth daily.   nitroGLYCERIN 0.4 MG SL tablet Commonly known as:  NITROSTAT Place 1 tablet (0.4 mg total) under the tongue every 5 (five) minutes as needed for chest pain.   pantoprazole 40 MG tablet Commonly known as:  PROTONIX Take 1 tablet (40 mg total) by mouth 2 (two) times daily.      Allergies  Allergen Reactions  . Vytorin [Ezetimibe-Simvastatin]     Weakness, muscle aches bad.  . Gabapentin Other (See Comments)    Twitching  . Oxycodone Nausea And Vomiting   Follow-up Information    TAPPER,DAVID B, MD. Schedule an appointment as soon as possible for a visit in 2 week(s).   Specialty:  Family Medicine Contact information: [address] [place] [id number] [phone]            The results of significant diagnostics from this hospitalization (including imaging, microbiology, ancillary and laboratory) are listed below for reference.    Significant Diagnostic Studies: Dg Chest Portable 1 View  Result Date: 01/01/2016 CLINICAL DATA:  Left-sided chest pain and shortness of Breath EXAM: PORTABLE CHEST 1 VIEW COMPARISON:  05/22/2015 FINDINGS: Cardiac shadow is stable. Bilateral fluffy infiltrates are identified likely related to underlying pulmonary edema. Central vascular congestion is noted as well. No sizable effusion is seen. IMPRESSION: Changes consistent with CHF with pulmonary edema. Electronically Signed   By: Alcide CleverMark  Lukens M.D.   On: 01/01/2016 16:25    Microbiology: Recent Results (from the past 240 hour(s))  MRSA PCR Screening     Status: Abnormal   Collection Time: 01/01/16  8:50 PM  Result Value Ref Range Status   MRSA by PCR POSITIVE (A) NEGATIVE Final    Comment:        The GeneXpert MRSA Assay  (FDA approved for NASAL specimens only), is one component of a comprehensive MRSA colonization surveillance program. It is not intended to diagnose MRSA infection nor to guide or monitor treatment for MRSA infections. RESULT CALLED TO, READ BACK BY AND VERIFIED WITH: BELIZAIRE,W ON 01/01/16 AT 2250 BY LOY,C      Labs: Basic Metabolic Panel:  Recent Labs Lab 01/01/16 1435 01/02/16 1714 01/03/16 0846  NA 136 135  136 132*  K 4.2 4.9  4.9 4.5  CL 101 101  99* 99*  CO2 22 22  23 25   GLUCOSE 80 86  85 134*  BUN 73* 85*  84* 67*  CREATININE 13.16* 15.28*  15.00* 12.68*  CALCIUM 8.5* 8.3*  8.6* 8.2*  PHOS  --  6.6* 5.8*   Liver Function Tests:  Recent Labs Lab 01/01/16 1435 01/02/16 1714 01/03/16 0846  AST 14*  --   --  ALT 11*  --   --   ALKPHOS 46  --   --   BILITOT 1.0  --   --   PROT 6.9  --   --   ALBUMIN 4.0 3.6 3.7   No results for input(s): LIPASE, AMYLASE in the last 168 hours. No results for input(s): AMMONIA in the last 168 hours. CBC:  Recent Labs Lab 01/01/16 1435 01/02/16 1714 01/03/16 0846  WBC 12.8* 5.4 6.8  NEUTROABS 11.5*  --   --   HGB 9.0* 7.9* 8.0*  HCT 28.8* 25.7* 25.4*  MCV 93.8 93.5 93.4  PLT 173 145* 142*   Cardiac Enzymes:  Recent Labs Lab 01/01/16 1435 01/01/16 1835  TROPONINI 0.05* 0.07*   BNP: BNP (last 3 results) No results for input(s): BNP in the last 8760 hours.  ProBNP (last 3 results) No results for input(s): PROBNP in the last 8760 hours.  CBG:  Recent Labs Lab 01/02/16 1135 01/02/16 1233  GLUCAP 79 81       Signed:  HERNANDEZ ACOSTA,ESTELA  [facility] Pager: [phone] 01/03/2016, 10:55 AM

## 2016-01-04 NOTE — Progress Notes (Addendum)
Patient DC'd home.  IV removed - WNL.  Reviewed DC instructions and medications.  Instructed to follow up with PCP and GI. Verbalized understanding.  No questions at this time. Awaiting arrival of ride, in NAD.

## 2016-01-05 ENCOUNTER — Encounter: Payer: Self-pay | Admitting: Internal Medicine

## 2016-01-05 ENCOUNTER — Other Ambulatory Visit: Payer: Self-pay | Admitting: Internal Medicine

## 2016-01-05 ENCOUNTER — Other Ambulatory Visit: Payer: Self-pay

## 2016-01-05 ENCOUNTER — Telehealth: Payer: Self-pay | Admitting: Internal Medicine

## 2016-01-05 DIAGNOSIS — K922 Gastrointestinal hemorrhage, unspecified: Secondary | ICD-10-CM

## 2016-01-05 NOTE — Telephone Encounter (Signed)
Lab order done and mailed to the pt.  

## 2016-01-05 NOTE — Telephone Encounter (Signed)
RMR called to say that patient needed follow up in one month and CBC prior to OV. OV has been made for 9/22 at 0800 with LSL.

## 2016-01-08 ENCOUNTER — Encounter (HOSPITAL_COMMUNITY): Payer: Self-pay | Admitting: Internal Medicine

## 2016-01-14 NOTE — Addendum Note (Signed)
Addendum  created 01/14/16 1026 by Franco Nones, CRNA   Anesthesia Event edited, Anesthesia Staff edited

## 2016-01-19 ENCOUNTER — Other Ambulatory Visit: Payer: Self-pay | Admitting: Cardiology

## 2016-01-23 LAB — CBC WITH DIFFERENTIAL/PLATELET
BASOS ABS: 41 {cells}/uL (ref 0–200)
Basophils Relative: 1 %
EOS PCT: 3 %
Eosinophils Absolute: 123 cells/uL (ref 15–500)
HEMATOCRIT: 28.4 % — AB (ref 38.5–50.0)
HEMOGLOBIN: 8.7 g/dL — AB (ref 13.2–17.1)
LYMPHS ABS: 943 {cells}/uL (ref 850–3900)
Lymphocytes Relative: 23 %
MCH: 27.9 pg (ref 27.0–33.0)
MCHC: 30.6 g/dL — ABNORMAL LOW (ref 32.0–36.0)
MCV: 91 fL (ref 80.0–100.0)
MONO ABS: 328 {cells}/uL (ref 200–950)
MPV: 10.6 fL (ref 7.5–12.5)
Monocytes Relative: 8 %
NEUTROS ABS: 2665 {cells}/uL (ref 1500–7800)
Neutrophils Relative %: 65 %
Platelets: 143 10*3/uL (ref 140–400)
RBC: 3.12 MIL/uL — AB (ref 4.20–5.80)
RDW: 15.6 % — ABNORMAL HIGH (ref 11.0–15.0)
WBC: 4.1 10*3/uL (ref 3.8–10.8)

## 2016-01-29 NOTE — Progress Notes (Signed)
This encounter was created in error - please disregard.

## 2016-01-30 ENCOUNTER — Ambulatory Visit: Payer: Medicare Other | Admitting: Gastroenterology

## 2016-03-30 ENCOUNTER — Ambulatory Visit: Payer: Medicare Other | Admitting: Cardiology

## 2016-03-30 NOTE — Progress Notes (Deleted)
Clinical Summary Mr. Isenberg is a 45 y.o.male  1. CAD/ICM  -cath 03/2012 severe distal disease too small for intervention, multiple mild to moderate lesions. Managed medically echo 11/2013 LVEF 50-55%, grade II diastolic dysfunction, inferior hypokinesis.  - 02/2014 Lexiscan MPI inferior scar without ischemia, LVEF 33%.  - Over the last few visits he has been having some chest pain symptoms.  - aching/sharp pain with some SOB, left chest. 7/10. Could occur at rest or exertion, but more with exertion. Could be diaphoretic at time. Better with movement. Could be brought on by eating or drinking. Baby ASA made, rubbing could make better. Pain could last for hours consistently.  - symptoms did not improve with increasing his coreg. Tolerating imdur 30mg  bid, previously had caused headaches.   - recent DSE at [facility] showed decreased LVEF with stress. He later had a cath which showed RCA CTO, chronic distal LAD and LCX disease overall unchanged. Managed medically - no significant chest pain since last visit   2. HL  - on lovastatin due to costs, compliant - 04/2015 TC 128 TG 170 HDL 28 LDL 78  3. HTN  - compliant with meds - does not check regularly at home  4. PAD  - followed by vascular Dr Darrick Penna  - right femoropopiliteal bypass in March 2015.   5. SOB - seen by Dr Purvis Sheffield in late January due to SOB. Patient had not been able to tolerate hemodialysis due to muscle cramps (normal K and Mg by labs)and was accumulating fluid. He was started on metolazone 5mg  daily x 4 days. Swelling and SOB did improve.   6. ESRD - can tolerate only 2 hours of dialysis. HD T,TH,Sat - breathing improved with metolazone.  - reports his dry 101 kg.  - reports not eligible for transplant due to his CAD.  Past Medical History:  Diagnosis Date  . Anemia   . Anginal pain (HCC)   . CHF (congestive heart failure) (HCC)   . CKD (chronic kidney disease) stage 3, GFR 30-59 ml/min      on dialysis M/W/F - started March 2016  . Coronary atherosclerosis of native coronary artery    100% apical LAD and distal OM1, Rx medically - 11/13  . Depression   . Essential hypertension, benign   . Gouty arthritis   . History of blood transfusion 1988   "arm went thru glass window" (10/12/2012)  . Ischemic cardiomyopathy    LVEF 40-45%  . Mixed hyperlipidemia   . NSTEMI (non-ST elevated myocardial infarction) (HCC) 02/2012 and 10/2012  . PAD (peripheral artery disease) (HCC)    Normal ABIs, 2011; focal left EIA stenosis; mild bilateral distal SFA disease, 2008  . RBBB   . Shortness of breath    when he has fluid he gets sob  . Type 2 diabetes mellitus (HCC)    diet controlled     Allergies  Allergen Reactions  . Vytorin [Ezetimibe-Simvastatin]     Weakness, muscle aches bad.  . Gabapentin Other (See Comments)    Twitching  . Oxycodone Nausea And Vomiting     Current Outpatient Prescriptions  Medication Sig Dispense Refill  . albuterol (PROVENTIL HFA;VENTOLIN HFA) 108 (90 BASE) MCG/ACT inhaler Inhale 2 puffs into the lungs every 6 (six) hours as needed for wheezing or shortness of breath. 1 Inhaler 2  . amLODipine (NORVASC) 10 MG tablet Take 1 tablet (10 mg total) by mouth daily. 90 tablet 3  . atorvastatin (LIPITOR) 80 MG  tablet Take 1 tablet (80 mg total) by mouth daily. 30 tablet 11  . calcium acetate (PHOSLO) 667 MG capsule Take 2-4 capsules by mouth 3 (three) times daily. And with snacks  4  . carvedilol (COREG) 25 MG tablet Take 37.5 mg by mouth 2 (two) times daily with a meal.    . colchicine 0.6 MG tablet Take 0.6 mg by mouth 2 (two) times daily as needed (gout).     Marland Kitchen diphenhydramine-acetaminophen (TYLENOL PM) 25-500 MG TABS Take 1-2 tablets by mouth at bedtime as needed (for pain and sleep).    . docusate sodium (COLACE) 100 MG capsule Take 3 mg by mouth 2 (two) times daily as needed for mild constipation.     . furosemide (LASIX) 40 MG tablet Take 120 mg by  mouth daily.  2  . isosorbide mononitrate (IMDUR) 30 MG 24 hr tablet TAKE (1) TABLET TWICE DAILY. 60 tablet 3  . lidocaine-prilocaine (EMLA) cream Apply 1 application topically as needed. For port access  10  . metolazone (ZAROXOLYN) 2.5 MG tablet TAKE 1 BY MOUTH AS NEEDED  FOR WEIGHT OVER 220 POUNDS 30 tablet 2  . multivitamin (RENA-VIT) TABS tablet Take 1 tablet by mouth daily.    . nitroGLYCERIN (NITROSTAT) 0.4 MG SL tablet Place 1 tablet (0.4 mg total) under the tongue every 5 (five) minutes as needed for chest pain. 25 tablet 0  . pantoprazole (PROTONIX) 40 MG tablet Take 1 tablet (40 mg total) by mouth 2 (two) times daily. 60 tablet 2   No current facility-administered medications for this visit.      Past Surgical History:  Procedure Laterality Date  . Arm surgery Right [phone]   "@ least 3 ORs; took nerves out of my left leg and put them in my arm" (10/12/2012)  . AV FISTULA PLACEMENT Left 10/16/2013   Procedure: CREATION OF LEFT ARM BASILIC TO BRACHIAL FISTULA;  Surgeon: Sherren Kerns, MD;  Location: [facility] OR;  Service: Vascular;  Laterality: Left;  . BASCILIC VEIN TRANSPOSITION Left 08/13/2014   Procedure: Left arm SECOND STAGE BASILIC VEIN TRANSPOSITION;  Surgeon: Sherren Kerns, MD;  Location: [facility] OR;  Service: Vascular;  Laterality: Left;  . CARDIAC CATHETERIZATION  03/28/12   20% prox and mid LAD, 100% distal LAD, 30% prox OM, 100% distal OM, 40% prox RCA, 40% mid RCA, 25% distal RCA, small PDA w/ 80% diffuse dz, PLB small w/ 50% diffuse stenoses. No focal lesions for PCI. Recommendations for continued medical management and aggressive RF reduction  . COLONOSCOPY  ?2015   [facility], infection per patient  . ENDARTERECTOMY FEMORAL Right 07/17/2013   Procedure: ENDARTERECTOMY FEMORAL WITH PROFUNDAPLOASTY;  Surgeon: Sherren Kerns, MD;  Location[facility] OR;  Service: Vascular;  Laterality: Right;  . ESOPHAGOGASTRODUODENOSCOPY (EGD) WITH PROPOFOL N/A 01/02/2016    Procedure: ESOPHAGOGASTRODUODENOSCOPY (EGD) WITH PROPOFOL;  Surgeon: Corbin Ade, MD;  [facility];  Service: Endoscopy;  Laterality: N/A;  . EYE SURGERY Left 1990's   "blood vessel ruptured" (10/12/2012)  . FEMORAL-POPLITEAL BYPASS GRAFT Right 07/17/2013   Procedure: BYPASS GRAFT FEMORAL-POPLITEAL ARTERY-RIGHT;  Surgeon: Sherren Kerns, MD;  Location: [facility] OR;  Service: Vascular;  Laterality: Right;  . LEFT HEART CATHETERIZATION WITH CORONARY ANGIOGRAM N/A 03/28/2012   Procedure: LEFT HEART CATHETERIZATION WITH CORONARY ANGIOGRAM;  Surgeon: Kathleene Hazel, MD;  Location: [facility] CATH LAB;  Service: Cardiovascular;  Laterality: N/A;  . LOWER EXTREMITY ANGIOGRAM Right 07/11/2013   Procedure: LOWER EXTREMITY ANGIOGRAM;  Surgeon: Fransisco HertzBrian L Chen, MD;  [facility];  Service: Cardiovascular;  Laterality: Right;  rt leg angio  . PATCH ANGIOPLASTY Right 07/17/2013   Procedure: PATCH ANGIOPLASTY OF COMMON FEMORAL AND PROFUNDA USING SEGMENT OF GREATER SAPHENOUS VEIN ;  Surgeon: Sherren Kernsharles E Fields, MD;  [facility];  Service: Vascular;  Laterality: Right;  . PERIPHERAL VASCULAR CATHETERIZATION N/A 02/28/2015   Procedure: Fistulagram;  Surgeon: Sherren Kernsharles E Fields, MD;  [facility];  Service: Cardiovascular;  Laterality: N/A;  . PERIPHERAL VASCULAR CATHETERIZATION Left 02/28/2015   Procedure: A/V Shunt Intervention;  Surgeon: Sherren Kernsharles E Fields, MD;  [facility];  Service: Cardiovascular;  Laterality: Left;  . REFRACTIVE SURGERY Bilateral ~ 2005     Allergies  Allergen Reactions  . Vytorin [Ezetimibe-Simvastatin]     Weakness, muscle aches bad.  . Gabapentin Other (See Comments)    Twitching  . Oxycodone Nausea And Vomiting      Family History  Problem Relation Age of Onset  . Heart attack Father 1950  . Hypertension Father   . Hypertension Mother   . Cancer Mother   . Lupus Mother      Social History Mr. Rana SnareLowe reports that he quit smoking  about 2 years ago. His smoking use included Cigarettes. He started smoking about 34 years ago. He has a 12.50 pack-year smoking history. He has never used smokeless tobacco. Mr. Rana SnareLowe reports that he does not drink alcohol.   Review of Systems CONSTITUTIONAL: No weight loss, fever, chills, weakness or fatigue.  HEENT: Eyes: No visual loss, blurred vision, double vision or yellow sclerae.No hearing loss, sneezing, congestion, runny nose or sore throat.  SKIN: No rash or itching.  CARDIOVASCULAR:  RESPIRATORY: No shortness of breath, cough or sputum.  GASTROINTESTINAL: No anorexia, nausea, vomiting or diarrhea. No abdominal pain or blood.  GENITOURINARY: No burning on urination, no polyuria NEUROLOGICAL: No headache, dizziness, syncope, paralysis, ataxia, numbness or tingling in the extremities. No change in bowel or bladder control.  MUSCULOSKELETAL: No muscle, back pain, joint pain or stiffness.  LYMPHATICS: No enlarged nodes. No history of splenectomy.  PSYCHIATRIC: No history of depression or anxiety.  ENDOCRINOLOGIC: No reports of sweating, cold or heat intolerance. No polyuria or polydipsia.  Marland Kitchen.   Physical Examination There were no vitals filed for this visit. There were no vitals filed for this visit.  Gen: resting comfortably, no acute distress HEENT: no scleral icterus, pupils equal round and reactive, no palptable cervical adenopathy,  CV Resp: Clear to auscultation bilaterally GI: abdomen is soft, non-tender, non-distended, normal bowel sounds, no hepatosplenomegaly MSK: extremities are warm, no edema.  Skin: warm, no rash Neuro:  no focal deficits Psych: appropriate affect   Diagnostic Studies Cath 03/2012  Hemodynamic Findings:  Central aortic pressure: 150/100  Left ventricular pressure: 148/17/21  Angiographic Findings:  Left main: No obstructive disease.  Left Anterior Descending Artery: Large vessel that coursed to the apex. The proximal and mid vessel had  serial 20% lesions. The distal vessel had 100% occlusion just before the apex. The vessel was 1.75 mm in this location. The diagonal Clotiel Troop was small in caliber and patent with mild diffuse disease.  Circumflex Artery: Large caliber vessel with large first obtuse marginal Dontarious Schaum. There is a 30% stenosis in the proximal portion of the obtuse marginal Karo Rog. The distal segment of the OM Chistian Kasler is 100% occluded.  Right Coronary Artery: Large, dominant vessel with 40% proximal stenosis, long tubular 40% mid stenosis,  serial 25% distal stenoses. The PDA is very small caliber and has diffuse 80% stenosis. The Posterolateral Naylene Foell is small in caliber and has diffuse 50% stenosis.  Left Ventricular Angiogram: Deferred.  Impression:  1. Triple vessel CAD with occlusion of the apical LAD and distal portion of the first obtuse marginal Lorayne Getchell. No focal lesions for PCI  2. NSTEMI secondary to above.  Recommendations: Will continue medical management with aggressive risk factor reduction. He will need to stop smoking. We will continue statin, beta blocker. Will continue ASA and will load with Plavix. Will check echo to assess LVEF.  Complications: None. The patient tolerated the procedure well.   03/2012 echo  LVEF 40-45%, mild LVH, multiple WMAs, grade II diastolic dysfunction,   09/2013 Carotid US  Bilateral 1-39% ICA stenosis, patent antegrade vertebrals  02/2014 Lexiscan MPI IMPRESSION: 1. Inferior wall infarct extending from apex to base. No reversible ischemia.  2. Severe inferior wall hypokinesis.  3. Left ventricular ejection fraction 33%  4. High-risk stress test findings based upon extent of myocardial scar and severely reduced left ventricular systolic function.  11/2013 Echo Study Conclusions  - Left ventricle: The cavity size was mildly dilated. Wall thickness was increased in a pattern of mild LVH. Systolic function was normal. The estimated ejection fraction  was approximately 55%. Features are consistent with a pseudonormal left ventricular filling pattern, with concomitant abnormal relaxation and increased filling pressure (grade 2 diastolic dysfunction). Doppler parameters are consistent with high ventricular filling pressure. - Regional wall motion abnormality: Moderate hypokinesis of the basal inferior and mid inferolateral myocardium; mild hypokinesis of the mid inferior, basal-mid anterolateral, and apical lateral myocardium. - Aortic valve: Mildly thickened leaflets. - Mitral valve: Mildly thickened leaflets . There was trivial regurgitation. - Tricuspid valve: There was trivial regurgitation. - Pulmonic valve: There was trivial regurgitation.   04/2015 [facility] Cath (cannot find actual report in Careeverywhere. Results referred to in clinic note) Left Heart Cath done on 12/08 which showed similar disease when compared to [facility] report. Documentation mentioned distal, small disease that is causing his symptoms. No noticeable large defects that could be intervened upon. So he was medically optimized and discharged home on Imdur 15    Assessment and Plan   1. CAD/ICM/Chronic systolic heart failure - no current symptoms, continue current meds.   2. HL  - change to atorvastatin 80mg  daily in setting of known CAD, he now has insurance and should be able to get   3. HTN  - continue current meds , at goal  4. PAD  - follow with vascular   5. ESRD - compliant with HD, cannot tolerate long sessions due to muscle cramps. We will give metolazone 2.5mg  prn, continue lasix 120mg  daily      Antoine Poche, M.D., F.A.C.C.

## 2016-05-28 ENCOUNTER — Encounter: Payer: Self-pay | Admitting: Gastroenterology

## 2016-05-28 ENCOUNTER — Telehealth: Payer: Self-pay | Admitting: Gastroenterology

## 2016-05-28 NOTE — Telephone Encounter (Signed)
Patient cancelled his appointment for anemia couple of months ago. Seen in hospital. EGD done at that time. TCS was not because he reported completed at [facility] 2015.   I have reviewed one TCS report from [facility], states patient had INCOMPLETE colonoscopy. There were plans to have repeat for completion but not sure this was done.   Susan-->I have the TCS report from 07/06/13. Can you see if there was one done after that date at [facility].  PATIENT NEEDS TO HAVE FOLLOW UP VISIT FOR POSSIBLE COLONOSCOPY/ANEMIA.

## 2016-05-31 ENCOUNTER — Encounter: Payer: Self-pay | Admitting: Gastroenterology

## 2016-05-31 NOTE — Telephone Encounter (Signed)
I faxed a request to [facility] to see if there was anything else after 06/2013

## 2016-05-31 NOTE — Telephone Encounter (Signed)
APPT MADE AND LETTER SENT  °

## 2016-06-08 ENCOUNTER — Encounter: Payer: Self-pay | Admitting: Internal Medicine

## 2016-06-09 ENCOUNTER — Inpatient Hospital Stay (HOSPITAL_COMMUNITY)
Admission: EM | Admit: 2016-06-09 | Discharge: 2016-06-13 | DRG: 280 | Disposition: A | Discharge status: Home / Self Care | Payer: BLUE CROSS/BLUE SHIELD | Attending: Internal Medicine | Admitting: Internal Medicine

## 2016-06-09 ENCOUNTER — Encounter (HOSPITAL_COMMUNITY): Payer: Self-pay | Admitting: Emergency Medicine

## 2016-06-09 ENCOUNTER — Emergency Department (HOSPITAL_COMMUNITY): Payer: BLUE CROSS/BLUE SHIELD

## 2016-06-09 ENCOUNTER — Emergency Department (HOSPITAL_BASED_OUTPATIENT_CLINIC_OR_DEPARTMENT_OTHER): Payer: BLUE CROSS/BLUE SHIELD

## 2016-06-09 DIAGNOSIS — I132 Hypertensive heart and chronic kidney disease with heart failure and with stage 5 chronic kidney disease, or end stage renal disease: Secondary | ICD-10-CM | POA: Diagnosis present

## 2016-06-09 DIAGNOSIS — N184 Chronic kidney disease, stage 4 (severe): Secondary | ICD-10-CM | POA: Diagnosis not present

## 2016-06-09 DIAGNOSIS — Z87891 Personal history of nicotine dependence: Secondary | ICD-10-CM

## 2016-06-09 DIAGNOSIS — R778 Other specified abnormalities of plasma proteins: Secondary | ICD-10-CM | POA: Diagnosis present

## 2016-06-09 DIAGNOSIS — E669 Obesity, unspecified: Secondary | ICD-10-CM | POA: Diagnosis present

## 2016-06-09 DIAGNOSIS — I10 Essential (primary) hypertension: Secondary | ICD-10-CM | POA: Diagnosis not present

## 2016-06-09 DIAGNOSIS — Z8249 Family history of ischemic heart disease and other diseases of the circulatory system: Secondary | ICD-10-CM

## 2016-06-09 DIAGNOSIS — E782 Mixed hyperlipidemia: Secondary | ICD-10-CM | POA: Diagnosis not present

## 2016-06-09 DIAGNOSIS — I248 Other forms of acute ischemic heart disease: Secondary | ICD-10-CM

## 2016-06-09 DIAGNOSIS — F1210 Cannabis abuse, uncomplicated: Secondary | ICD-10-CM | POA: Diagnosis present

## 2016-06-09 DIAGNOSIS — E8889 Other specified metabolic disorders: Secondary | ICD-10-CM | POA: Diagnosis present

## 2016-06-09 DIAGNOSIS — I272 Pulmonary hypertension, unspecified: Secondary | ICD-10-CM | POA: Diagnosis present

## 2016-06-09 DIAGNOSIS — I255 Ischemic cardiomyopathy: Secondary | ICD-10-CM | POA: Diagnosis not present

## 2016-06-09 DIAGNOSIS — E785 Hyperlipidemia, unspecified: Secondary | ICD-10-CM | POA: Diagnosis present

## 2016-06-09 DIAGNOSIS — Z720 Tobacco use: Secondary | ICD-10-CM | POA: Diagnosis present

## 2016-06-09 DIAGNOSIS — E1151 Type 2 diabetes mellitus with diabetic peripheral angiopathy without gangrene: Secondary | ICD-10-CM | POA: Diagnosis present

## 2016-06-09 DIAGNOSIS — R7989 Other specified abnormal findings of blood chemistry: Secondary | ICD-10-CM | POA: Diagnosis present

## 2016-06-09 DIAGNOSIS — I214 Non-ST elevation (NSTEMI) myocardial infarction: Secondary | ICD-10-CM

## 2016-06-09 DIAGNOSIS — K219 Gastro-esophageal reflux disease without esophagitis: Secondary | ICD-10-CM | POA: Diagnosis present

## 2016-06-09 DIAGNOSIS — E7800 Pure hypercholesterolemia, unspecified: Secondary | ICD-10-CM | POA: Diagnosis not present

## 2016-06-09 DIAGNOSIS — Z888 Allergy status to other drugs, medicaments and biological substances status: Secondary | ICD-10-CM

## 2016-06-09 DIAGNOSIS — I25118 Atherosclerotic heart disease of native coronary artery with other forms of angina pectoris: Secondary | ICD-10-CM

## 2016-06-09 DIAGNOSIS — Z7982 Long term (current) use of aspirin: Secondary | ICD-10-CM

## 2016-06-09 DIAGNOSIS — I509 Heart failure, unspecified: Secondary | ICD-10-CM

## 2016-06-09 DIAGNOSIS — I2510 Atherosclerotic heart disease of native coronary artery without angina pectoris: Secondary | ICD-10-CM | POA: Diagnosis not present

## 2016-06-09 DIAGNOSIS — R079 Chest pain, unspecified: Secondary | ICD-10-CM | POA: Diagnosis not present

## 2016-06-09 DIAGNOSIS — Z885 Allergy status to narcotic agent status: Secondary | ICD-10-CM

## 2016-06-09 DIAGNOSIS — Z992 Dependence on renal dialysis: Secondary | ICD-10-CM

## 2016-06-09 DIAGNOSIS — Z79899 Other long term (current) drug therapy: Secondary | ICD-10-CM

## 2016-06-09 DIAGNOSIS — N2581 Secondary hyperparathyroidism of renal origin: Secondary | ICD-10-CM | POA: Diagnosis present

## 2016-06-09 DIAGNOSIS — I739 Peripheral vascular disease, unspecified: Secondary | ICD-10-CM | POA: Diagnosis present

## 2016-06-09 DIAGNOSIS — I4510 Unspecified right bundle-branch block: Secondary | ICD-10-CM | POA: Diagnosis present

## 2016-06-09 DIAGNOSIS — I5043 Acute on chronic combined systolic (congestive) and diastolic (congestive) heart failure: Secondary | ICD-10-CM

## 2016-06-09 DIAGNOSIS — E1122 Type 2 diabetes mellitus with diabetic chronic kidney disease: Secondary | ICD-10-CM | POA: Diagnosis present

## 2016-06-09 DIAGNOSIS — D649 Anemia, unspecified: Secondary | ICD-10-CM | POA: Diagnosis present

## 2016-06-09 DIAGNOSIS — N186 End stage renal disease: Secondary | ICD-10-CM | POA: Diagnosis present

## 2016-06-09 DIAGNOSIS — R748 Abnormal levels of other serum enzymes: Secondary | ICD-10-CM | POA: Diagnosis not present

## 2016-06-09 DIAGNOSIS — Z6834 Body mass index (BMI) 34.0-34.9, adult: Secondary | ICD-10-CM

## 2016-06-09 DIAGNOSIS — F1410 Cocaine abuse, uncomplicated: Secondary | ICD-10-CM | POA: Diagnosis present

## 2016-06-09 DIAGNOSIS — I252 Old myocardial infarction: Secondary | ICD-10-CM

## 2016-06-09 DIAGNOSIS — Z5329 Procedure and treatment not carried out because of patient's decision for other reasons: Secondary | ICD-10-CM | POA: Diagnosis not present

## 2016-06-09 DIAGNOSIS — R071 Chest pain on breathing: Secondary | ICD-10-CM | POA: Diagnosis present

## 2016-06-09 LAB — ECHOCARDIOGRAM COMPLETE
AVLVOTPG: 4 mmHg
CHL CUP DOP CALC LVOT VTI: 20 cm
CHL CUP MV DEC (S): 173
E decel time: 173 msec
EERAT: 32.16
FS: 15 % — AB (ref 28–44)
Height: 68 in
IV/PV OW: 0.93
LA diam index: 1.93 cm/m2
LA vol A4C: 65 ml
LA vol index: 31.7 mL/m2
LA vol: 70.5 mL
LASIZE: 43 mm
LEFT ATRIUM END SYS DIAM: 43 mm
LV E/e'average: 32.16
LV PW d: 13.2 mm — AB (ref 0.6–1.1)
LV TDI E'LATERAL: 3.98
LV TDI E'MEDIAL: 3.75
LV dias vol index: 88 mL/m2
LV dias vol: 195 mL — AB (ref 62–150)
LV e' LATERAL: 3.98 cm/s
LV sys vol: 140 mL — AB (ref 21–61)
LVEEMED: 32.16
LVOT SV: 76 mL
LVOT area: 3.8 cm2
LVOT peak vel: 106 cm/s
LVOTD: 22 mm
LVSYSVOLIN: 63 mL/m2
Lateral S' vel: 9.3 cm/s
MV pk E vel: 128 m/s
MVPG: 7 mmHg
MVPKAVEL: 64.5 m/s
RV sys press: 40 mmHg
Reg peak vel: 303 cm/s
Simpson's disk: 28
Stroke v: 55 ml
TAPSE: 17.8 mm
TRMAXVEL: 303 cm/s
VTI: 134 cm
WEIGHTICAEL: 3520 [oz_av]

## 2016-06-09 LAB — COMPREHENSIVE METABOLIC PANEL
ALBUMIN: 3.5 g/dL (ref 3.5–5.0)
ALT: 9 U/L — ABNORMAL LOW (ref 17–63)
ANION GAP: 13 (ref 5–15)
AST: 9 U/L — ABNORMAL LOW (ref 15–41)
Alkaline Phosphatase: 42 U/L (ref 38–126)
BUN: 62 mg/dL — ABNORMAL HIGH (ref 6–20)
CHLORIDE: 98 mmol/L — AB (ref 101–111)
CO2: 26 mmol/L (ref 22–32)
Calcium: 8.8 mg/dL — ABNORMAL LOW (ref 8.9–10.3)
Creatinine, Ser: 12.28 mg/dL — ABNORMAL HIGH (ref 0.61–1.24)
GFR calc non Af Amer: 4 mL/min — ABNORMAL LOW (ref >60)
GFR, EST AFRICAN AMERICAN: 5 mL/min — AB (ref >60)
GLUCOSE: 117 mg/dL — AB (ref 65–99)
Potassium: 4.8 mmol/L (ref 3.5–5.1)
SODIUM: 137 mmol/L (ref 135–145)
Total Bilirubin: 0.8 mg/dL (ref 0.3–1.2)
Total Protein: 6.9 g/dL (ref 6.5–8.1)

## 2016-06-09 LAB — TROPONIN I
TROPONIN I: 1.14 ng/mL — AB (ref <0.03)
TROPONIN I: 1.48 ng/mL — AB (ref <0.03)
Troponin I: 0.4 ng/mL (ref <0.03)
Troponin I: 0.8 ng/mL (ref <0.03)

## 2016-06-09 LAB — CBC WITH DIFFERENTIAL/PLATELET
Basophils Absolute: 0 10*3/uL (ref 0.0–0.1)
Basophils Relative: 1 %
EOS ABS: 0.2 10*3/uL (ref 0.0–0.7)
EOS PCT: 3 %
HCT: 35.2 % — ABNORMAL LOW (ref 39.0–52.0)
Hemoglobin: 11.2 g/dL — ABNORMAL LOW (ref 13.0–17.0)
LYMPHS ABS: 0.8 10*3/uL (ref 0.7–4.0)
Lymphocytes Relative: 16 %
MCH: 29.2 pg (ref 26.0–34.0)
MCHC: 31.8 g/dL (ref 30.0–36.0)
MCV: 91.9 fL (ref 78.0–100.0)
MONOS PCT: 8 %
Monocytes Absolute: 0.5 10*3/uL (ref 0.1–1.0)
NEUTROS PCT: 72 %
Neutro Abs: 3.9 10*3/uL (ref 1.7–7.7)
PLATELETS: 180 10*3/uL (ref 150–400)
RBC: 3.83 MIL/uL — AB (ref 4.22–5.81)
RDW: 14.5 % (ref 11.5–15.5)
WBC: 5.4 10*3/uL (ref 4.0–10.5)

## 2016-06-09 LAB — GLUCOSE, CAPILLARY
GLUCOSE-CAPILLARY: 93 mg/dL (ref 65–99)
GLUCOSE-CAPILLARY: 87 mg/dL (ref 65–99)

## 2016-06-09 LAB — BRAIN NATRIURETIC PEPTIDE: B NATRIURETIC PEPTIDE 5: 1577 pg/mL — AB (ref 0.0–100.0)

## 2016-06-09 MED ORDER — DOCUSATE SODIUM 100 MG PO CAPS: 100.0000 mg | ORAL_CAPSULE | Freq: Two times a day (BID) | ORAL | Status: DC | PRN

## 2016-06-09 MED ORDER — HEPARIN (PORCINE) IN NACL 100-0.45 UNIT/ML-% IJ SOLN
1250.0000 [IU]/h | INTRAMUSCULAR | Status: DC
  Administered 2016-06-09: 1250 [IU]/h via INTRAVENOUS
  Filled 2016-06-09: qty 250

## 2016-06-09 MED ORDER — ASPIRIN 81 MG PO CHEW
324.0000 mg | CHEWABLE_TABLET | Freq: Once | ORAL | Status: AC
  Administered 2016-06-09: 324 mg via ORAL
  Filled 2016-06-09: qty 4

## 2016-06-09 MED ORDER — HYDROMORPHONE HCL 1 MG/ML IJ SOLN
1.0000 mg | Freq: Once | INTRAMUSCULAR | Status: AC
  Administered 2016-06-09: 1 mg via INTRAVENOUS
  Filled 2016-06-09: qty 1

## 2016-06-09 MED ORDER — PANTOPRAZOLE SODIUM 40 MG PO TBEC
40.0000 mg | DELAYED_RELEASE_TABLET | Freq: Two times a day (BID) | ORAL | Status: DC
  Administered 2016-06-09 – 2016-06-13 (×9): 40 mg via ORAL
  Filled 2016-06-09 (×11): qty 1

## 2016-06-09 MED ORDER — ONDANSETRON HCL 4 MG/2ML IJ SOLN: 4.0000 mg | Freq: Four times a day (QID) | INTRAMUSCULAR | Status: DC | PRN

## 2016-06-09 MED ORDER — HEPARIN SODIUM (PORCINE) 5000 UNIT/ML IJ SOLN
5000.0000 [IU] | Freq: Three times a day (TID) | INTRAMUSCULAR | Status: DC
  Administered 2016-06-09 – 2016-06-10 (×3): 5000 [IU] via SUBCUTANEOUS
  Filled 2016-06-09 (×3): qty 1

## 2016-06-09 MED ORDER — CARVEDILOL 12.5 MG PO TABS
37.5000 mg | ORAL_TABLET | Freq: Two times a day (BID) | ORAL | Status: DC
  Administered 2016-06-09 – 2016-06-10 (×2): 37.5 mg via ORAL
  Filled 2016-06-09 (×2): qty 3

## 2016-06-09 MED ORDER — ALBUTEROL SULFATE (2.5 MG/3ML) 0.083% IN NEBU: 2.5000 mg | INHALATION_SOLUTION | Freq: Four times a day (QID) | RESPIRATORY_TRACT | Status: DC | PRN

## 2016-06-09 MED ORDER — MORPHINE SULFATE (PF) 10 MG/ML IV SOLN
6.0000 mg | Freq: Once | INTRAVENOUS | Status: AC
  Administered 2016-06-09: 6 mg via SUBCUTANEOUS
  Filled 2016-06-09: qty 1

## 2016-06-09 MED ORDER — DIPHENHYDRAMINE-APAP (SLEEP) 25-500 MG PO TABS: 1.0000 | ORAL_TABLET | Freq: Every evening | ORAL | Status: DC | PRN

## 2016-06-09 MED ORDER — GI COCKTAIL ~~LOC~~
30.0000 mL | Freq: Once | ORAL | Status: AC
  Administered 2016-06-09: 30 mL via ORAL
  Filled 2016-06-09: qty 30

## 2016-06-09 MED ORDER — FAMOTIDINE 20 MG PO TABS
20.0000 mg | ORAL_TABLET | Freq: Once | ORAL | Status: AC
  Administered 2016-06-09: 20 mg via ORAL
  Filled 2016-06-09: qty 1

## 2016-06-09 MED ORDER — HEPARIN BOLUS VIA INFUSION
4000.0000 [IU] | Freq: Once | INTRAVENOUS | Status: AC
  Administered 2016-06-09: 4000 [IU] via INTRAVENOUS

## 2016-06-09 MED ORDER — ATORVASTATIN CALCIUM 20 MG PO TABS
20.0000 mg | ORAL_TABLET | Freq: Every day | ORAL | Status: DC
  Administered 2016-06-09 – 2016-06-10 (×2): 20 mg via ORAL
  Filled 2016-06-09 (×2): qty 1

## 2016-06-09 MED ORDER — ACETAMINOPHEN 325 MG PO TABS: 650.0000 mg | ORAL_TABLET | ORAL | Status: DC | PRN

## 2016-06-09 MED ORDER — TORSEMIDE 20 MG PO TABS
40.0000 mg | ORAL_TABLET | Freq: Three times a day (TID) | ORAL | Status: DC
  Administered 2016-06-09 – 2016-06-11 (×6): 40 mg via ORAL
  Filled 2016-06-09 (×13): qty 2

## 2016-06-09 MED ORDER — ALBUTEROL SULFATE HFA 108 (90 BASE) MCG/ACT IN AERS: 2.0000 | INHALATION_SPRAY | Freq: Four times a day (QID) | RESPIRATORY_TRACT | Status: DC | PRN

## 2016-06-09 MED ORDER — NITROGLYCERIN IN D5W 200-5 MCG/ML-% IV SOLN
10.0000 ug/min | INTRAVENOUS | Status: DC
  Administered 2016-06-09: 10 ug/min via INTRAVENOUS
  Filled 2016-06-09: qty 250

## 2016-06-09 MED ORDER — ONDANSETRON HCL 4 MG/2ML IJ SOLN
4.0000 mg | Freq: Once | INTRAMUSCULAR | Status: AC
  Administered 2016-06-09: 4 mg via INTRAVENOUS
  Filled 2016-06-09: qty 2

## 2016-06-09 MED ORDER — SEVELAMER CARBONATE 800 MG PO TABS
2400.0000 mg | ORAL_TABLET | Freq: Two times a day (BID) | ORAL | Status: DC
  Administered 2016-06-09 – 2016-06-12 (×5): 2400 mg via ORAL
  Filled 2016-06-09 (×5): qty 3

## 2016-06-09 MED ORDER — COLCHICINE 0.6 MG PO TABS: 0.6000 mg | ORAL_TABLET | Freq: Two times a day (BID) | ORAL | Status: DC | PRN

## 2016-06-09 MED ORDER — MORPHINE SULFATE (PF) 2 MG/ML IV SOLN
2.0000 mg | INTRAVENOUS | Status: DC | PRN
Start: 2016-06-09 — End: 2016-06-12
  Administered 2016-06-09 – 2016-06-12 (×13): 2 mg via INTRAVENOUS
  Filled 2016-06-09 (×12): qty 1

## 2016-06-09 MED ORDER — ACETAMINOPHEN 500 MG PO TABS: 500.0000 mg | ORAL_TABLET | Freq: Every evening | ORAL | Status: DC | PRN

## 2016-06-09 MED ORDER — RENA-VITE PO TABS
1.0000 | ORAL_TABLET | Freq: Every day | ORAL | Status: DC
  Administered 2016-06-09 – 2016-06-13 (×4): 1 via ORAL
  Filled 2016-06-09 (×4): qty 1

## 2016-06-09 MED ORDER — AMLODIPINE BESYLATE 10 MG PO TABS
10.0000 mg | ORAL_TABLET | Freq: Every day | ORAL | Status: DC
  Administered 2016-06-10: 10 mg via ORAL
  Filled 2016-06-09: qty 2

## 2016-06-09 MED ORDER — ASPIRIN 81 MG PO CHEW
81.0000 mg | CHEWABLE_TABLET | Freq: Every day | ORAL | Status: DC
  Administered 2016-06-09 – 2016-06-13 (×5): 81 mg via ORAL
  Filled 2016-06-09 (×5): qty 1

## 2016-06-09 MED ORDER — DIPHENHYDRAMINE HCL 25 MG PO CAPS
25.0000 mg | ORAL_CAPSULE | Freq: Four times a day (QID) | ORAL | Status: DC | PRN
  Administered 2016-06-09: 25 mg via ORAL
  Administered 2016-06-11: 50 mg via ORAL
  Administered 2016-06-13: 25 mg via ORAL
  Filled 2016-06-09: qty 2
  Filled 2016-06-09 (×2): qty 1
  Filled 2016-06-09: qty 2

## 2016-06-09 NOTE — ED Triage Notes (Signed)
Per EMS: Pt C/O left sided chest pain. Pt states he took 3 nitrates and "3 or 4 baby Asprin" with no relief.

## 2016-06-09 NOTE — Progress Notes (Signed)
*  PRELIMINARY RESULTS* Echocardiogram 2D Echocardiogram has been performed.  Stacey Drain 06/09/2016, 12:51 PM

## 2016-06-09 NOTE — Consult Note (Addendum)
Cardiology Consultation   Patient ID: [name]; [id number]; [birth year]   Admit date: 06/09/2016 Date of Consult: 06/09/2016  Referring MD: Dr. Erin Hearing Cardiologist: Dr. Wyline Mood  Consulting Cardiologist: Dr. Purvis Sheffield  Patient Care Team: Oley Balm. Margo Common, MD as PCP - General (Family Medicine) Salomon Mast, MD as Attending Physician (Nephrology) Ludger Nutting, MD as Consulting Physician (Podiatry)    Reason for Consultation: Chest pain   History of Present Illness: [name] is a 46 y.o. male with a hx of CAD cath in 03/2012 severe distal disease too small for intervention, multiple mild to moderate lesions manage medically. Most recent cardiac cath [facility] 04/2015 can't find actual report in care everywhere but clinic notes showed similar disease when compared to [facility] left heart cath. Distal small disease that is causing his symptoms managed medically. [id number] but 55% echo 2015, end-stage renal disease on hemodialysis, PAD status post right femoropopliteal bypass 07/2013, hypertension, HLD.  Patient now presents with recurrent chest pain and shortness of breath. EKG normal sinus rhythm with right bundle branch block, no acute change, BNP 1577, initial troponin 0.40, creatinine 12.28.  Patient states that he developed chest pain described as an aching in his left chest radiating down his left arm about 1:30 yesterday afternoon. He had no associated nausea, vomiting, diaphoresis, or dyspnea. Pain lasted all day long and through the night. He tried taking 3 nitroglycerin and 4 Nexium without relief. In the ER his pain has improved with Dilaudid and Ntg. "Down from a  20 to a 5"  Had UGI bleed 12/2015 secondary to antral erosions. Troponins elevated then but no chest pain or EKG changes.  Past Medical History:  Diagnosis Date  . Anemia   . Anginal pain (HCC)   . CHF (congestive heart failure) (HCC)   . CKD (chronic kidney disease) stage 3,  GFR 30-59 ml/min    on dialysis M/W/F - started March 2016  . Coronary atherosclerosis of native coronary artery    100% apical LAD and distal OM1, Rx medically - 11/13  . Depression   . Essential hypertension, benign   . Gouty arthritis   . History of blood transfusion 1988   "arm went thru glass window" (10/12/2012)  . Ischemic cardiomyopathy    LVEF 40-45%  . Mixed hyperlipidemia   . NSTEMI (non-ST elevated myocardial infarction) (HCC) 02/2012 and 10/2012  . PAD (peripheral artery disease) (HCC)    Normal ABIs, 2011; focal left EIA stenosis; mild bilateral distal SFA disease, 2008  . RBBB   . Shortness of breath    when he has fluid he gets sob  . Type 2 diabetes mellitus (HCC)    diet controlled    Past Surgical History:  Procedure Laterality Date  . Arm surgery Right [phone]   "@ least 3 ORs; took nerves out of my left leg and put them in my arm" (10/12/2012)  . AV FISTULA PLACEMENT Left 10/16/2013   Procedure: CREATION OF LEFT ARM BASILIC TO BRACHIAL FISTULA;  Surgeon: Sherren Kerns, MD;  Location: [facility] OR;  Service: Vascular;  Laterality: Left;  . BASCILIC VEIN TRANSPOSITION Left 08/13/2014   Procedure: Left arm SECOND STAGE BASILIC VEIN TRANSPOSITION;  Surgeon: Sherren Kerns, MD;  Location: [facility] OR;  Service: Vascular;  Laterality: Left;  . CARDIAC CATHETERIZATION  03/28/12   20% prox and mid LAD, 100% distal LAD, 30% prox OM, 100% distal OM, 40% prox RCA, 40% mid RCA, 25% distal RCA, small PDA w/ 80% diffuse dz,  PLB small w/ 50% diffuse stenoses. No focal lesions for PCI. Recommendations for continued medical management and aggressive RF reduction  . COLONOSCOPY  2015   Meta Hatchet: incomplete colonoscopy due to inadequate prep, inflammation found sigmoid colon to splenic flexure/distal transverse colon. Ischemic colitis vs infectious  . ENDARTERECTOMY FEMORAL Right 07/17/2013   Procedure: ENDARTERECTOMY FEMORAL WITH PROFUNDAPLOASTY;  Surgeon: Sherren Kerns, MD;   Location: [facility] OR;  Service: Vascular;  Laterality: Right;  . ESOPHAGOGASTRODUODENOSCOPY (EGD) WITH PROPOFOL N/A 01/02/2016   Procedure: ESOPHAGOGASTRODUODENOSCOPY (EGD) WITH PROPOFOL;  Surgeon: Corbin Ade, MD;  [facility];  Service: Endoscopy;  Laterality: N/A;  . EYE SURGERY Left 1990's   "blood vessel ruptured" (10/12/2012)  . FEMORAL-POPLITEAL BYPASS GRAFT Right 07/17/2013   Procedure: BYPASS GRAFT FEMORAL-POPLITEAL ARTERY-RIGHT;  Surgeon: Sherren Kerns, MD;  Location: [facility] OR;  Service: Vascular;  Laterality: Right;  . LEFT HEART CATHETERIZATION WITH CORONARY ANGIOGRAM N/A 03/28/2012   Procedure: LEFT HEART CATHETERIZATION WITH CORONARY ANGIOGRAM;  Surgeon: Kathleene Hazel, MD;  Location: [facility] CATH LAB;  Service: Cardiovascular;  Laterality: N/A;  . LOWER EXTREMITY ANGIOGRAM Right 07/11/2013   Procedure: LOWER EXTREMITY ANGIOGRAM;  Surgeon: Fransisco Hertz, MD;  Location: [facility] CATH LAB;  Service: Cardiovascular;  Laterality: Right;  rt leg angio  . PATCH ANGIOPLASTY Right 07/17/2013   Procedure: PATCH ANGIOPLASTY OF COMMON FEMORAL AND PROFUNDA USING SEGMENT OF GREATER SAPHENOUS VEIN ;  Surgeon: Sherren Kerns, MD;  Location: [facility] OR;  Service: Vascular;  Laterality: Right;  . PERIPHERAL VASCULAR CATHETERIZATION N/A 02/28/2015   Procedure: Fistulagram;  Surgeon: Sherren Kerns, MD;  Location: [facility] INVASIVE CV LAB;  Service: Cardiovascular;  Laterality: N/A;  . PERIPHERAL VASCULAR CATHETERIZATION Left 02/28/2015   Procedure: A/V Shunt Intervention;  Surgeon: Sherren Kerns, MD;  Location: [facility] INVASIVE CV LAB;  Service: Cardiovascular;  Laterality: Left;  . REFRACTIVE SURGERY Bilateral ~ 2005      Home Meds: Prior to Admission medications   Medication Sig Start Date End Date Taking? Authorizing Provider  esomeprazole (NEXIUM) 40 MG capsule Take 40 mg by mouth daily at 12 noon.   Yes Historical Provider, MD  albuterol (PROVENTIL HFA;VENTOLIN HFA) 108 (90 BASE) MCG/ACT inhaler  Inhale 2 puffs into the lungs every 6 (six) hours as needed for wheezing or shortness of breath. 04/14/15   Antoine Poche, MD  amLODipine (NORVASC) 10 MG tablet Take 1 tablet (10 mg total) by mouth daily. 03/27/14   Antoine Poche, MD  atorvastatin (LIPITOR) 80 MG tablet Take 1 tablet (80 mg total) by mouth daily. 08/06/15   Antoine Poche, MD  calcium acetate (PHOSLO) 667 MG capsule Take 2-4 capsules by mouth 3 (three) times daily. And with snacks 02/17/15   Historical Provider, MD  carvedilol (COREG) 25 MG tablet Take 37.5 mg by mouth 2 (two) times daily with a meal.    Historical Provider, MD  colchicine 0.6 MG tablet Take 0.6 mg by mouth 2 (two) times daily as needed (gout).     Historical Provider, MD  diphenhydramine-acetaminophen (TYLENOL PM) 25-500 MG TABS Take 1-2 tablets by mouth at bedtime as needed (for pain and sleep).    Historical Provider, MD  docusate sodium (COLACE) 100 MG capsule Take 3 mg by mouth 2 (two) times daily as needed for mild constipation.     Historical Provider, MD  furosemide (LASIX) 40 MG tablet Take 120 mg by mouth daily. 02/25/15   Historical Provider, MD  isosorbide mononitrate (IMDUR)  30 MG 24 hr tablet TAKE (1) TABLET TWICE DAILY. 01/19/16   Antoine Poche, MD  lidocaine-prilocaine (EMLA) cream Apply 1 application topically as needed. For port access 01/27/15   Historical Provider, MD  metolazone (ZAROXOLYN) 2.5 MG tablet TAKE 1 BY MOUTH AS NEEDED  FOR WEIGHT OVER 220 POUNDS 09/25/15   Antoine Poche, MD  multivitamin (RENA-VIT) TABS tablet Take 1 tablet by mouth daily.    Historical Provider, MD  nitroGLYCERIN (NITROSTAT) 0.4 MG SL tablet Place 1 tablet (0.4 mg total) under the tongue every 5 (five) minutes as needed for chest pain. 10/01/15   Antoine Poche, MD  pantoprazole (PROTONIX) 40 MG tablet Take 1 tablet (40 mg total) by mouth 2 (two) times daily. 01/03/16   Henderson Cloud, MD    Current Medications: . amLODipine  10 mg Oral  Daily  . aspirin  81 mg Oral Daily  . carvedilol  37.5 mg Oral BID WC  . heparin  4,000 Units Intravenous Once  . pantoprazole  40 mg Oral BID  . torsemide  40 mg Oral TID     Allergies:    Allergies  Allergen Reactions  . Vytorin [Ezetimibe-Simvastatin]     Weakness, muscle aches bad.  . Gabapentin Other (See Comments)    Twitching  . Oxycodone Nausea And Vomiting    Social History:   The patient  reports that he quit smoking about 2 years ago. His smoking use included Cigarettes. He started smoking about 34 years ago. He has a 12.50 pack-year smoking history. He has never used smokeless tobacco. He reports that he uses drugs, including Cocaine, "Crack" cocaine, and Marijuana. He reports that he does not drink alcohol.    Family History:   The patient's family history includes Cancer in his mother; Heart attack (age of onset: 67) in his father; Hypertension in his father and mother; Lupus in his mother.   ROS:  Please see the history of present illness.  Review of Systems  Constitution: Positive for weakness and malaise/fatigue.  HENT: Negative.   Cardiovascular: Positive for chest pain, dyspnea on exertion and leg swelling.  Respiratory: Negative.   Endocrine: Negative.   Hematologic/Lymphatic: Negative.   Musculoskeletal: Positive for muscle cramps and myalgias.  Gastrointestinal: Negative.   Genitourinary: Negative.    All other ROS reviewed and negative.      Vital Signs: Blood pressure 112/71, pulse 77, temperature 98.6 F (37 C), temperature source Oral, resp. rate 10, height 5\' 8"  (1.727 m), weight 220 lb (99.8 kg), SpO2 95 %.   PHYSICAL EXAM: General:  Obese, normal Lymph: no adenopathy Neck: no JVD Endocrine:  No thryomegaly Vascular: No carotid bruits; FA pulses 2+ bilaterally without bruits  Cardiac:  RRR; normal S1, S2;as of S4 and 2/6 systolic murmur at the lower left sternal border Lungs:  clear to auscultation bilaterally, no wheezing, rhonchi or rales    Abd: soft, nontender, no hepatomegaly  Exwas 1 edema, Good distal pulses bilaterally Musculoskeletal:  No deformities, BUE and BLE strength normal and equal Skin: warm and dry  Neuro:  CNs 2-12 intact, no focal abnormalities noted Psych:  Normal affect    EKG:  Sinus rhythm with right bundle branch block  Telemetry:normal sinus rhythm Labs:  Recent Labs  06/09/16 0740  TROPONINI 0.40*   Lab Results  Component Value Date   WBC 5.4 06/09/2016   HGB 11.2 (L) 06/09/2016   HCT 35.2 (L) 06/09/2016   MCV 91.9 06/09/2016  PLT 180 06/09/2016    Recent Labs Lab 06/09/16 0740  NA 137  K 4.8  CL 98*  CO2 26  BUN 62*  CREATININE 12.28*  CALCIUM 8.8*  PROT 6.9  BILITOT 0.8  ALKPHOS 42  ALT 9*  AST 9*  GLUCOSE 117*   Lab Results  Component Value Date   CHOL 255 (H) 02/19/2013   HDL 32 (L) 02/19/2013   LDLCALC  02/19/2013     Comment:       Not calculated due to Triglyceride >400. Suggest ordering Direct LDL (Unit Code: 16109).   Total Cholesterol/HDL Ratio:CHD Risk                        Coronary Heart Disease Risk Table                                        Men       Women          1/2 Average Risk              3.4        3.3              Average Risk              5.0        4.4           2X Average Risk              9.6        7.1           3X Average Risk             23.4       11.0 Use the calculated Patient Ratio above and the CHD Risk table  to determine the patient's CHD Risk. ATP III Classification (LDL):       < 100        mg/dL         Optimal      604 - 129     mg/dL         Near or Above Optimal      130 - 159     mg/dL         Borderline High      160 - 189     mg/dL         High       > 540        mg/dL         Very High     TRIG 545 (H) 02/19/2013   No results found for: DDIMER  Radiology/Studies:  Dg Chest 2 View  Result Date: 06/09/2016 CLINICAL DATA:  Left-sided chest pain for 1 day EXAM: CHEST  2 VIEW COMPARISON:  01/01/2016  FINDINGS: Cardiac shadow remains enlarged. The lungs are well aerated bilaterally. No focal infiltrate or sizable effusion is seen. No bony abnormality is noted. Previously seen bilateral infiltrates have cleared in the interval. IMPRESSION: No active cardiopulmonary disease. Electronically Signed   By: Alcide Clever M.D.   On: 06/09/2016 08:20   Cath 03/2012   Hemodynamic Findings:   Central aortic pressure: 150/100   Left ventricular pressure: 148/17/21   Angiographic Findings:   Left main: No obstructive disease.   Left Anterior Descending Artery: Large vessel that coursed to  the apex. The proximal and mid vessel had serial 20% lesions. The distal vessel had 100% occlusion just before the apex. The vessel was 1.75 mm in this location. The diagonal branch was small in caliber and patent with mild diffuse disease.   Circumflex Artery: Large caliber vessel with large first obtuse marginal branch. There is a 30% stenosis in the proximal portion of the obtuse marginal branch. The distal segment of the OM branch is 100% occluded.   Right Coronary Artery: Large, dominant vessel with 40% proximal stenosis, long tubular 40% mid stenosis, serial 25% distal stenoses. The PDA is very small caliber and has diffuse 80% stenosis. The Posterolateral branch is small in caliber and has diffuse 50% stenosis.   Left Ventricular Angiogram: Deferred.   Impression:   1. 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 No focal lesions for PCI   2. NSTEMI secondary to above.   Recommendations: Will continue medical management with aggressive risk factor reduction. He will need to stop smoking. We will continue statin, beta blocker. Will continue ASA and will load with Plavix. Will check echo to assess LVEF.   Complications: None. The patient tolerated the procedure well.     03/2012 echo  LVEF 40-45%, mild LVH, multiple WMAs, grade II diastolic dysfunction,       09/2013 Carotid US   Bilateral 1-39% ICA stenosis, patent antegrade vertebrals   02/2014 Lexiscan MPI IMPRESSION: 1. Inferior wall infarct extending from apex to base. No reversible ischemia.   2.  Severe inferior wall hypokinesis.   3. Left ventricular ejection fraction 33%   4. High-risk stress test findings based upon extent of myocardial scar and severely reduced left ventricular systolic function.   11/2013 Echo Study Conclusions  - Left ventricle: The cavity size was mildly dilated. Wall   thickness was increased in a pattern of mild LVH. Systolic   function was normal. The estimated ejection fraction was   approximately 55%. Features are consistent with a pseudonormal   left ventricular filling pattern, with concomitant abnormal   relaxation and increased filling pressure (grade 2 diastolic   dysfunction). Doppler parameters are consistent with high   ventricular filling pressure. - Regional wall motion abnormality: Moderate hypokinesis of the   basal inferior and mid inferolateral myocardium; mild hypokinesis   of the mid inferior, basal-mid anterolateral, and apical lateral   myocardium. - Aortic valve: Mildly thickened leaflets. - Mitral valve: Mildly thickened leaflets . There was trivial   regurgitation. - Tricuspid valve: There was trivial regurgitation. - Pulmonic valve: There was trivial regurgitation.     04/2015 [facility] Cath (cannot find actual report in Careeverywhere. Results referred to in clinic note) Left Heart Cath done on 12/08 which showed similar disease when compared to [facility] report. Documentation mentioned distal, small disease that is causing his symptoms. No noticeable large defects that could be intervened upon. So he was medically optimized and discharged home on Imdur 15     PROBLEM LIST:  Active Problems:   Ischemic cardiomyopathy   CKD (chronic kidney disease), stage IV (HCC)   Elevated troponin I level      ASSESSMENT AND PLAN:  Chest pain with troponin of 0.40, no acute EKG changes and pain improving with dilaudid. With known small vessel disease would recommend IV nitroglycerin and admission to the hospital here to adjust medications. If his second troponin is markedly elevated reconsider transfer. Hold off on IV  heparin because of recent GI bleed.   CAD with triple vessel CAD and known occlusion of the apical LAD and distal OM 1 on cath in 2013. Similar findings on cath at [facility] 02/2015. Resume carvedilol and norvasc.  Ischemic cardiomyopathy 2-D echo 2015 EF 55% with grade 2 DD. Recheck 2Decho.  End-stage renal disease patient scheduled for dialysis today. Recommend renal consult. Patient only tolerates 2 hours of dialysis 4 days a week because of severe cramping.  Hypertension pressure on the low side so we'll have to monitor closely on IV nitroglycerin   HLD on lovastatin due to cost  PAD status post right femoropopliteal bypass 07/2013  Signed, Jacolyn Reedy, PA-C  06/09/2016 10:33 AM   The patient was seen and examined, and I agree with the history, physical exam, assessment and plan as documented above. Patient with aforementioned CV history presenting with chest pain, shortness of breath, with radiation to left arm. He has received nitro and Dilaudid as mentioned above with reduction of pain level from "50 to 5". He has ESRD and today is his dialysis day so nephrology will need to be involved in his care. ECG shows sinus rhythm with RBBB. Initial troponin 0.4. Will provide IV nitroglycerin for pain relief and avoid IV heparin due to h/o GI bleed. Hgb stable at this time. Will obtain echocardiogram to evaluate cardiac structure and function. Recommend hospitalization at [facility] for serial serum troponins. If markedly elevated, will transfer to [facility] for coronary angiography.  For now, will manage medically. Continue ASA, Lipitor, Coreg, and  nitrates.   Prentice Docker, MD, [facility]  06/09/2016 11:39 AM   Echocardiogram reviewed. EF 25% with diffuse hypokinesis, grade 2 diastolic dysfunction, and elevated filling pressures. Troponins rising from 0.4-->0.8-->1.14. He is being transferred to [facility] for further management (IV heparin and coronary angiography). I discussed this with the fellow on call, Dr. Orson Aloe, who will await his arrival.

## 2016-06-09 NOTE — Progress Notes (Signed)
Texted Dr. Laural Benes, C. About troponin increasing from 0.80 to 1.14.

## 2016-06-09 NOTE — Progress Notes (Signed)
ANTICOAGULATION CONSULT NOTE - Initial Consult  Pharmacy Consult for HEPARIN Indication: chest pain/ACS  Allergies  Allergen Reactions  . Vytorin [Ezetimibe-Simvastatin]     Weakness, muscle aches bad.  . Gabapentin Other (See Comments)    Twitching  . Oxycodone Nausea And Vomiting   Patient Measurements: Height: 5\' 8"  (172.7 cm) Weight: 220 lb (99.8 kg) IBW/kg (Calculated) : 68.4 HEPARIN DW (KG): 89.8  Vital Signs: Temp: 98.6 F (37 C) (01/31 0655) Temp Source: Oral (01/31 0655) BP: 112/71 (01/31 0930) Pulse Rate: 77 (01/31 0930)  Labs:  Recent Labs  06/09/16 0740  HGB 11.2*  HCT 35.2*  PLT 180  CREATININE 12.28*  TROPONINI 0.40*   Estimated Creatinine Clearance: 8.6 mL/min (by C-G formula based on SCr of 12.28 mg/dL (H)).  Medical History: Past Medical History:  Diagnosis Date  . Anemia   . Anginal pain (HCC)   . CHF (congestive heart failure) (HCC)   . CKD (chronic kidney disease) stage 3, GFR 30-59 ml/min    on dialysis M/W/F - started March 2016  . Coronary atherosclerosis of native coronary artery    100% apical LAD and distal OM1, Rx medically - 11/13  . Depression   . Essential hypertension, benign   . Gouty arthritis   . History of blood transfusion 1988   "arm went thru glass window" (10/12/2012)  . Ischemic cardiomyopathy    LVEF 40-45%  . Mixed hyperlipidemia   . NSTEMI (non-ST elevated myocardial infarction) (HCC) 02/2012 and 10/2012  . PAD (peripheral artery disease) (HCC)    Normal ABIs, 2011; focal left EIA stenosis; mild bilateral distal SFA disease, 2008  . RBBB   . Shortness of breath    when he has fluid he gets sob  . Type 2 diabetes mellitus (HCC)    diet controlled   Medications:   (Not in a hospital admission)  Assessment: 46yo male with c/o left sided CP.  Asked to initiate Heparin for ACS.  Goal of Therapy:  Heparin level 0.3-0.7 units/ml Monitor platelets by anticoagulation protocol: Yes   Plan:  Heparin 4000 units  IV bolus now x 1 Heparin infusion at 1250 units/hr Heparin level in 6-8 hours then daily CBC daily while on Heparin  Margo Aye, Rodriquez Thorner A 06/09/2016,10:02 AM

## 2016-06-09 NOTE — ED Notes (Signed)
CRITICAL VALUE ALERT  Critical value received:  Troponin 0.40  Date of notification:  06/09/16  Time of notification:  0820  Critical value read back:Yes.    Nurse who received alert:  RMinter, RN  MD notified (1st page):  Dr. Clayborne Dana  Time of first page:  0820  MD notified (2nd page):  Time of second page:  Responding MD:  Dr. Clayborne Dana  Time MD responded:  [phone]

## 2016-06-09 NOTE — H&P (Signed)
History and Physical  [name] ZOX:[id number] DOB: 04-04-1971 DOA: 06/09/2016  Referring physician: Clayborne Dana, MD PCP: Louie Boston, MD  Cardiologist: Branch  Chief Complaint: chest pain   HPI: [name] is a 46 y.o. male with known CAD, CKD on hemodialysis MWF, CHF, HTN and diet controlled DM presents to ED by EMS with complaints of ongoing chest pain.  He reported that he took 3 NTG with no relief.  He also took 3-4 baby aspirin with no significant relief.  Patient states that sometime in the night he had acute onset of left-sided chest achiness that included his left arm. 49 to having nausea, vomiting, sweating, shortness of breath with this. Does not feel like previous episodes of heart related pain. Says it feels more similar to previous history of reflux. However did not get better with multiple doses of Nexium, nitroglycerin or aspirin at home. Came here for evaluation. No recent cough or fever. Has not missed any dialysis sessions however is next one was scheduled for today.  He was seen by cardiology service in ED and noted to have elevated troponin and elevated BNP.  He missed his HD today.  He reports aching pain in left chest radiating down left arm.   He had no associated nausea, vomiting, diaphoresis, or dyspnea. Pain lasted all day long and through the night.   In the ER his pain has improved with Dilaudid and Ntg. "Down from a  20 to a 5."  The patient was started on IV nitroglycerin infusion and admission was requested.    Review of Systems: All systems reviewed and apart from history of presenting illness, are negative.  Past Medical History:  Diagnosis Date  . Anemia   . Anginal pain (HCC)   . CHF (congestive heart failure) (HCC)   . CKD (chronic kidney disease) stage 3, GFR 30-59 ml/min    on dialysis M/W/F - started March 2016  . Coronary atherosclerosis of native coronary artery    100% apical LAD and distal OM1, Rx medically - 11/13  . Depression   .  Essential hypertension, benign   . Gouty arthritis   . History of blood transfusion 1988   "arm went thru glass window" (10/12/2012)  . Ischemic cardiomyopathy    LVEF 40-45%  . Mixed hyperlipidemia   . NSTEMI (non-ST elevated myocardial infarction) (HCC) 02/2012 and 10/2012  . PAD (peripheral artery disease) (HCC)    Normal ABIs, 2011; focal left EIA stenosis; mild bilateral distal SFA disease, 2008  . RBBB   . Shortness of breath    when he has fluid he gets sob  . Type 2 diabetes mellitus (HCC)    diet controlled   Past Surgical History:  Procedure Laterality Date  . Arm surgery Right [id number]   "@ least 3 ORs; took nerves out of my left leg and put them in my arm" (10/12/2012)  . AV FISTULA PLACEMENT Left 10/16/2013   Procedure: CREATION OF LEFT ARM BASILIC TO BRACHIAL FISTULA;  Surgeon: Sherren Kerns, MD;  Location: [facility] OR;  Service: Vascular;  Laterality: Left;  . BASCILIC VEIN TRANSPOSITION Left 08/13/2014   Procedure: Left arm SECOND STAGE BASILIC VEIN TRANSPOSITION;  Surgeon: Sherren Kerns, MD;  Location: [facility] OR;  Service: Vascular;  Laterality: Left;  . CARDIAC CATHETERIZATION  03/28/12   20% prox and mid LAD, 100% distal LAD, 30% prox OM, 100% distal OM, 40% prox RCA, 40% mid RCA, 25% distal RCA, small PDA w/ 80%  diffuse dz, PLB small w/ 50% diffuse stenoses. No focal lesions for PCI. Recommendations for continued medical management and aggressive RF reduction  . COLONOSCOPY  2015   [facility] Hatchet: incomplete colonoscopy due to inadequate prep, inflammation found sigmoid colon to splenic flexure/distal transverse colon. Ischemic colitis vs infectious  . ENDARTERECTOMY FEMORAL Right 07/17/2013   Procedure: ENDARTERECTOMY FEMORAL WITH PROFUNDAPLOASTY;  Surgeon: Sherren Kerns, MD;  Location: [facility] OR;  Service: Vascular;  Laterality: Right;  . ESOPHAGOGASTRODUODENOSCOPY (EGD) WITH PROPOFOL N/A 01/02/2016   Procedure: ESOPHAGOGASTRODUODENOSCOPY (EGD) WITH PROPOFOL;  Surgeon: Corbin Ade, MD;  Location: [facility] ENDO SUITE;  Service: Endoscopy;  Laterality: N/A;  . EYE SURGERY Left 1990's   "blood vessel ruptured" (10/12/2012)  . FEMORAL-POPLITEAL BYPASS GRAFT Right 07/17/2013   Procedure: BYPASS GRAFT FEMORAL-POPLITEAL ARTERY-RIGHT;  Surgeon: Sherren Kerns, MD;  Location: [facility] OR;  Service: Vascular;  Laterality: Right;  . LEFT HEART CATHETERIZATION WITH CORONARY ANGIOGRAM N/A 03/28/2012   Procedure: LEFT HEART CATHETERIZATION WITH CORONARY ANGIOGRAM;  Surgeon: Kathleene Hazel, MD;  Location: [facility] CATH LAB;  Service: Cardiovascular;  Laterality: N/A;  . LOWER EXTREMITY ANGIOGRAM Right 07/11/2013   Procedure: LOWER EXTREMITY ANGIOGRAM;  Surgeon: Fransisco Hertz, MD;  Location: [facility] CATH LAB;  Service: Cardiovascular;  Laterality: Right;  rt leg angio  . PATCH ANGIOPLASTY Right 07/17/2013   Procedure: PATCH ANGIOPLASTY OF COMMON FEMORAL AND PROFUNDA USING SEGMENT OF GREATER SAPHENOUS VEIN ;  Surgeon: Sherren Kerns, MD;  Location: [facility] OR;  Service: Vascular;  Laterality: Right;  . PERIPHERAL VASCULAR CATHETERIZATION N/A 02/28/2015   Procedure: Fistulagram;  Surgeon: Sherren Kerns, MD;  Location: [facility] INVASIVE CV LAB;  Service: Cardiovascular;  Laterality: N/A;  . PERIPHERAL VASCULAR CATHETERIZATION Left 02/28/2015   Procedure: A/V Shunt Intervention;  Surgeon: Sherren Kerns, MD;  Location: [facility] INVASIVE CV LAB;  Service: Cardiovascular;  Laterality: Left;  . REFRACTIVE SURGERY Bilateral ~ 2005   Social History:  reports that he quit smoking about 2 years ago. His smoking use included Cigarettes. He started smoking about 34 years ago. He has a 12.50 pack-year smoking history. He has never used smokeless tobacco. He reports that he uses drugs, including Cocaine, "Crack" cocaine, and Marijuana. He reports that he does not drink alcohol.   Allergies  Allergen Reactions  . Vytorin [Ezetimibe-Simvastatin]     Weakness, muscle aches bad.  . Gabapentin Other (See Comments)     Twitching  . Oxycodone Nausea And Vomiting    Family History  Problem Relation Age of Onset  . Heart attack Father 54  . Hypertension Father   . Hypertension Mother   . Cancer Mother   . Lupus Mother    Prior to Admission medications   Medication Sig Start Date End Date Taking? Authorizing Provider  albuterol (PROVENTIL HFA;VENTOLIN HFA) 108 (90 BASE) MCG/ACT inhaler Inhale 2 puffs into the lungs every 6 (six) hours as needed for wheezing or shortness of breath. 04/14/15  Yes Antoine Poche, MD  amLODipine (NORVASC) 10 MG tablet Take 1 tablet (10 mg total) by mouth daily. 03/27/14  Yes Antoine Poche, MD  aspirin 81 MG chewable tablet Chew 81 mg by mouth daily.   Yes Historical Provider, MD  atorvastatin (LIPITOR) 20 MG tablet Take 20 mg by mouth daily.   Yes Historical Provider, MD  carvedilol (COREG) 25 MG tablet Take 37.5 mg by mouth 2 (two) times daily with a meal.   Yes Historical Provider, MD  colchicine 0.6 MG tablet  Take 0.6 mg by mouth 2 (two) times daily as needed (gout).    Yes Historical Provider, MD  diphenhydramine-acetaminophen (TYLENOL PM) 25-500 MG TABS Take 1-2 tablets by mouth at bedtime as needed (for pain and sleep).   Yes Historical Provider, MD  docusate sodium (COLACE) 100 MG capsule Take 3 mg by mouth 2 (two) times daily as needed for mild constipation.    Yes Historical Provider, MD  esomeprazole (NEXIUM) 40 MG capsule Take 40 mg by mouth daily at 12 noon.   Yes Historical Provider, MD  isosorbide mononitrate (IMDUR) 30 MG 24 hr tablet TAKE (1) TABLET TWICE DAILY. 01/19/16  Yes Antoine Poche, MD  lidocaine-prilocaine (EMLA) cream Apply 1 application topically as needed. For port access 01/27/15  Yes Historical Provider, MD  multivitamin (RENA-VIT) TABS tablet Take 1 tablet by mouth daily.   Yes Historical Provider, MD  nitroGLYCERIN (NITROSTAT) 0.4 MG SL tablet Place 1 tablet (0.4 mg total) under the tongue every 5 (five) minutes as needed for chest pain.  10/01/15  Yes Antoine Poche, MD  sevelamer carbonate (RENVELA) 800 MG tablet Take 2,400 mg by mouth 2 (two) times daily. 06/04/16  Yes Historical Provider, MD  torsemide (DEMADEX) 20 MG tablet Take 40 mg by mouth 3 (three) times daily. Fluid   Yes Historical Provider, MD  pantoprazole (PROTONIX) 40 MG tablet Take 1 tablet (40 mg total) by mouth 2 (two) times daily. 01/03/16   Henderson Cloud, MD   Physical Exam: Vitals:   06/09/16 1000 06/09/16 1030 06/09/16 1100 06/09/16 1133  BP: 102/68 114/74 131/86   Pulse: 77 75 75 76  Resp: 16 23 18 13   Temp:      TempSrc:      SpO2: 96% 97% 96% 95%  Weight:      Height:         General exam: chronically ill appearing male, lying comfortably supine on the gurney in no obvious distress.  Head, eyes and ENT: Nontraumatic and normocephalic. Pupils equally reacting to light and accommodation. Oral mucosa dry.  Neck: Supple. No thyromegaly.  Lymphatics: No lymphadenopathy.  Respiratory system:  No increased work of breathing.  Cardiovascular system: S1 and S2 heard. 2/6 systolic murmur at the lower left sternal border.   Gastrointestinal system: Abdomen is nondistended, soft and nontender. Normal bowel sounds heard. No organomegaly or masses appreciated.  Central nervous system: Alert and oriented. No focal neurological deficits.  Skin: No acute findings.  Musculoskeletal system: Negative exam.  Psychiatry: Pleasant and cooperative.   Labs on Admission:  Basic Metabolic Panel:  Recent Labs Lab 06/09/16 0740  NA 137  K 4.8  CL 98*  CO2 26  GLUCOSE 117*  BUN 62*  CREATININE 12.28*  CALCIUM 8.8*   Liver Function Tests:  Recent Labs Lab 06/09/16 0740  AST 9*  ALT 9*  ALKPHOS 42  BILITOT 0.8  PROT 6.9  ALBUMIN 3.5   No results for input(s): LIPASE, AMYLASE in the last 168 hours. No results for input(s): AMMONIA in the last 168 hours. CBC:  Recent Labs Lab 06/09/16 0740  WBC 5.4  NEUTROABS 3.9  HGB  11.2*  HCT 35.2*  MCV 91.9  PLT 180   Cardiac Enzymes:  Recent Labs Lab 06/09/16 0740  TROPONINI 0.40*    BNP (last 3 results) No results for input(s): PROBNP in the last 8760 hours. CBG: No results for input(s): GLUCAP in the last 168 hours.  Radiological Exams on Admission: Dg Chest 2  View  Result Date: 06/09/2016 CLINICAL DATA:  Left-sided chest pain for 1 day EXAM: CHEST  2 VIEW COMPARISON:  01/01/2016 FINDINGS: Cardiac shadow remains enlarged. The lungs are well aerated bilaterally. No focal infiltrate or sizable effusion is seen. No bony abnormality is noted. Previously seen bilateral infiltrates have cleared in the interval. IMPRESSION: No active cardiopulmonary disease. Electronically Signed   By: Alcide Clever M.D.   On: 06/09/2016 08:20    EKG: Independently reviewed. RBBB seen.   Assessment/Plan Principal Problem:   Chest pain Active Problems:   Ischemic cardiomyopathy   CKD (chronic kidney disease), stage IV (HCC)   Elevated troponin I level   Hyperlipidemia   Type 2 diabetes mellitus with diabetic chronic kidney disease (HCC)   PAD (peripheral artery disease) (HCC)   Tobacco abuse   Essential hypertension, benign   Coronary atherosclerosis of native coronary artery   PVD (peripheral vascular disease) (HCC)   End stage renal disease (HCC)   Chest pain with high risk for cardiac etiology   1. Chest pain - high risk for cardiac etiology, appreciate cardiology team assistance, admit to SDU on IV NTG infusion,  Holding heparin infusion with history of recent GI bleeding.  Cycle troponin tests. Continue aspirin.  Morphine ordered as needed for pain. Cardiology feels it is safe to manage patient medically here but to transfer to [facility] if troponin becomes markedly elevated.   2. ESRD on HD - Consult nephrology service for hemodialysis today.  3. Hyperlipidemia - check fasting lipids, resume statin for now. 4. Type 2 DM - has been diet controlled, will follow.   5. Essential hypertension - low but stable on home meds, following closely on NTG infusion.  6. PVD - stable, pt is s/p femoropopliteal bypass 07/2013 7. Ischemic cardiomyopathy - Echo was done in 2015: EF 55% with grade 2 Diastolic dysfunction.    Time spent: 50 mins  Standley Dakins, MD [facility] Pager [phone]  If 7PM-7AM, please contact night-coverage [email or web address] Password [email or web address] 06/09/2016, 11:55 AM

## 2016-06-09 NOTE — ED Notes (Signed)
Pt transported to Xray. 

## 2016-06-09 NOTE — Progress Notes (Addendum)
Patient's troponin rose to 1.1.  He still has residual chest pain as before.  IV access has been difficult, and we are still trying to get it established. ESRD on HD, but doesn't require dialysis tonight. No SOB, K is OK, and Cr same at 12. Spoke with Dr Wolfgang Phoenix and he agrees.  Will give 6mg  SQ morphine, and give NTP if IV can't be reestablished. He may need a central line.  Will continue with cycling his troponins. His prior cath x 2  (2013 and 2016) revealed distal disease too small for intervention.   Houston Siren MD FACP. Hospitalist.

## 2016-06-09 NOTE — ED Notes (Signed)
ECHO in progress at bedside at this time.

## 2016-06-09 NOTE — ED Notes (Signed)
Report given to Lyman Bishop, RN at this time for ICU.

## 2016-06-09 NOTE — ED Provider Notes (Signed)
[facility]-EMERGENCY DEPT Provider Note   CSN: [id number] Arrival date & time: 06/09/16  4098     History   Chief Complaint Chief Complaint  Patient presents with  . Chest Pain    HPI HERSEY Salazar is a 46 y.o. male.  46 year old male with history of chronic kidney disease on dialysis, coronary artery disease, CHF, hypertension, diabetes presents to the emergency department today secondary to chest pain. Patient states that sometime in the night he had acute onset of left-sided chest achiness that included his left arm. 49 to having nausea, vomiting, sweating, shortness of breath with this. Does not feel like previous episodes of heart related pain. Says it feels more similar to previous history of reflux. However did not get better with multiple doses of Nexium, nitroglycerin or aspirin at home. Came here for evaluation. No recent cough or fever. Has not missed any dialysis sessions however is next one was scheduled for today.      Past Medical History:  Diagnosis Date  . Anemia   . Anginal pain (HCC)   . CHF (congestive heart failure) (HCC)   . CKD (chronic kidney disease) stage 3, GFR 30-59 ml/min    on dialysis M/W/F - started March 2016  . Coronary atherosclerosis of native coronary artery    100% apical LAD and distal OM1, Rx medically - 11/13  . Depression   . Essential hypertension, benign   . Gouty arthritis   . History of blood transfusion 1988   "arm went thru glass window" (10/12/2012)  . Ischemic cardiomyopathy    LVEF 40-45%  . Mixed hyperlipidemia   . NSTEMI (non-ST elevated myocardial infarction) (HCC) 02/2012 and 10/2012  . PAD (peripheral artery disease) (HCC)    Normal ABIs, 2011; focal left EIA stenosis; mild bilateral distal SFA disease, 2008  . RBBB   . Shortness of breath    when he has fluid he gets sob  . Type 2 diabetes mellitus (HCC)    diet controlled    Patient Active Problem List   Diagnosis Date Noted  . Chest pain 06/09/2016  . Chest pain  with high risk for cardiac etiology 06/09/2016  . Absolute anemia   . Upper GI bleed 01/01/2016  . Elevated troponin I level 01/01/2016  . SOB (shortness of breath) 01/01/2016  . End stage renal disease (HCC) 11/01/2013  . Pain in limb-Right groin 09/06/2013  . Discoloration of skin of toe-Right great toe 09/06/2013  . Wound drainage-Right groin-Right Thigh 09/06/2013  . Blood in stool- Today 09/06/2013  . Acute on chronic renal failure (HCC) 07/09/2013  . Noninfectious gastroenteritis and colitis 07/09/2013  . CKD (chronic kidney disease), stage IV (HCC) 07/09/2013  . PVD (peripheral vascular disease) (HCC) 06/28/2013  . Ischemic toe 05/31/2013  . Ischemic cardiomyopathy   . Coronary atherosclerosis of native coronary artery 07/12/2012  . NSTEMI (non-ST elevated myocardial infarction) (HCC) 03/28/2012  . Hyperlipidemia 03/28/2012  . Type 2 diabetes mellitus with diabetic chronic kidney disease (HCC) 03/28/2012  . PAD (peripheral artery disease) (HCC) 03/28/2012  . Tobacco abuse 03/28/2012  . Essential hypertension, benign 03/28/2012    Past Surgical History:  Procedure Laterality Date  . Arm surgery Right [phone]   "@ least 3 ORs; took nerves out of my left leg and put them in my arm" (10/12/2012)  . AV FISTULA PLACEMENT Left 10/16/2013   Procedure: CREATION OF LEFT ARM BASILIC TO BRACHIAL FISTULA;  Surgeon: Sherren Kerns, MD;  Location: [facility] OR;  Service: Vascular;  Laterality: Left;  . BASCILIC VEIN TRANSPOSITION Left 08/13/2014   Procedure: Left arm SECOND STAGE BASILIC VEIN TRANSPOSITION;  Surgeon: Sherren Kerns, MD;  Location: [facility] OR;  Service: Vascular;  Laterality: Left;  . CARDIAC CATHETERIZATION  03/28/12   20% prox and mid LAD, 100% distal LAD, 30% prox OM, 100% distal OM, 40% prox RCA, 40% mid RCA, 25% distal RCA, small PDA w/ 80% diffuse dz, PLB small w/ 50% diffuse stenoses. No focal lesions for PCI. Recommendations for continued medical management and aggressive RF  reduction  . COLONOSCOPY  2015   Meta Hatchet: incomplete colonoscopy due to inadequate prep, inflammation found sigmoid colon to splenic flexure/distal transverse colon. Ischemic colitis vs infectious  . ENDARTERECTOMY FEMORAL Right 07/17/2013   Procedure: ENDARTERECTOMY FEMORAL WITH PROFUNDAPLOASTY;  Surgeon: Sherren Kerns, MD;  Location: [facility] OR;  Service: Vascular;  Laterality: Right;  . ESOPHAGOGASTRODUODENOSCOPY (EGD) WITH PROPOFOL N/A 01/02/2016   Procedure: ESOPHAGOGASTRODUODENOSCOPY (EGD) WITH PROPOFOL;  Surgeon: Corbin Ade, MD;  Location: [facility] ENDO SUITE;  Service: Endoscopy;  Laterality: N/A;  . EYE SURGERY Left 1990's   "blood vessel ruptured" (10/12/2012)  . FEMORAL-POPLITEAL BYPASS GRAFT Right 07/17/2013   Procedure: BYPASS GRAFT FEMORAL-POPLITEAL ARTERY-RIGHT;  Surgeon: Sherren Kerns, MD;  Location: [facility] OR;  Service: Vascular;  Laterality: Right;  . LEFT HEART CATHETERIZATION WITH CORONARY ANGIOGRAM N/A 03/28/2012   Procedure: LEFT HEART CATHETERIZATION WITH CORONARY ANGIOGRAM;  Surgeon: Kathleene Hazel, MD;  Location: [facility] CATH LAB;  Service: Cardiovascular;  Laterality: N/A;  . LOWER EXTREMITY ANGIOGRAM Right 07/11/2013   Procedure: LOWER EXTREMITY ANGIOGRAM;  Surgeon: Fransisco Hertz, MD;  Location: [facility] CATH LAB;  Service: Cardiovascular;  Laterality: Right;  rt leg angio  . PATCH ANGIOPLASTY Right 07/17/2013   Procedure: PATCH ANGIOPLASTY OF COMMON FEMORAL AND PROFUNDA USING SEGMENT OF GREATER SAPHENOUS VEIN ;  Surgeon: Sherren Kerns, MD;  Location: [facility] OR;  Service: Vascular;  Laterality: Right;  . PERIPHERAL VASCULAR CATHETERIZATION N/A 02/28/2015   Procedure: Fistulagram;  Surgeon: Sherren Kerns, MD;  Location: [facility] INVASIVE CV LAB;  Service: Cardiovascular;  Laterality: N/A;  . PERIPHERAL VASCULAR CATHETERIZATION Left 02/28/2015   Procedure: A/V Shunt Intervention;  Surgeon: Sherren Kerns, MD;  Location: [facility] INVASIVE CV LAB;  Service: Cardiovascular;  Laterality: Left;   . REFRACTIVE SURGERY Bilateral ~ 2005       Home Medications    Prior to Admission medications   Medication Sig Start Date End Date Taking? Authorizing Provider  albuterol (PROVENTIL HFA;VENTOLIN HFA) 108 (90 BASE) MCG/ACT inhaler Inhale 2 puffs into the lungs every 6 (six) hours as needed for wheezing or shortness of breath. 04/14/15  Yes Antoine Poche, MD  amLODipine (NORVASC) 10 MG tablet Take 1 tablet (10 mg total) by mouth daily. 03/27/14  Yes Antoine Poche, MD  aspirin 81 MG chewable tablet Chew 81 mg by mouth daily.   Yes Historical Provider, MD  atorvastatin (LIPITOR) 20 MG tablet Take 20 mg by mouth daily.   Yes Historical Provider, MD  carvedilol (COREG) 25 MG tablet Take 37.5 mg by mouth 2 (two) times daily with a meal.   Yes Historical Provider, MD  colchicine 0.6 MG tablet Take 0.6 mg by mouth 2 (two) times daily as needed (gout).    Yes Historical Provider, MD  diphenhydramine-acetaminophen (TYLENOL PM) 25-500 MG TABS Take 1-2 tablets by mouth at bedtime as needed (for pain and sleep).   Yes Historical Provider, MD  docusate sodium (COLACE) 100 MG  capsule Take 3 mg by mouth 2 (two) times daily as needed for mild constipation.    Yes Historical Provider, MD  esomeprazole (NEXIUM) 40 MG capsule Take 40 mg by mouth daily at 12 noon.   Yes Historical Provider, MD  isosorbide mononitrate (IMDUR) 30 MG 24 hr tablet TAKE (1) TABLET TWICE DAILY. 01/19/16  Yes Antoine Poche, MD  lidocaine-prilocaine (EMLA) cream Apply 1 application topically as needed. For port access 01/27/15  Yes Historical Provider, MD  multivitamin (RENA-VIT) TABS tablet Take 1 tablet by mouth daily.   Yes Historical Provider, MD  nitroGLYCERIN (NITROSTAT) 0.4 MG SL tablet Place 1 tablet (0.4 mg total) under the tongue every 5 (five) minutes as needed for chest pain. 10/01/15  Yes Antoine Poche, MD  sevelamer carbonate (RENVELA) 800 MG tablet Take 2,400 mg by mouth 2 (two) times daily. 06/04/16  Yes  Historical Provider, MD  torsemide (DEMADEX) 20 MG tablet Take 40 mg by mouth 3 (three) times daily. Fluid   Yes Historical Provider, MD  pantoprazole (PROTONIX) 40 MG tablet Take 1 tablet (40 mg total) by mouth 2 (two) times daily. 01/03/16   Henderson Cloud, MD    Family History Family History  Problem Relation Age of Onset  . Heart attack Father 7  . Hypertension Father   . Hypertension Mother   . Cancer Mother   . Lupus Mother     Social History Social History  Substance Use Topics  . Smoking status: Former Smoker    Packs/day: 0.50    Years: 25.00    Types: Cigarettes    Start date: 07/23/1981    Quit date: 07/11/2013  . Smokeless tobacco: Never Used  . Alcohol use No     Comment: 10/12/2012 "quit drinking > 10 yr ago; was an alcoholic"     Allergies   Vytorin [ezetimibe-simvastatin]; Gabapentin; and Oxycodone   Review of Systems Review of Systems  All other systems reviewed and are negative.    Physical Exam Updated Vital Signs BP 125/71 (BP Location: Right Wrist)   Pulse 76   Temp 97.3 F (36.3 C) (Oral)   Resp 13   Ht 5\' 8"  (1.727 m)   Wt 223 lb 12.3 oz (101.5 kg)   SpO2 98%   BMI 34.02 kg/m   Physical Exam  Constitutional: He appears well-developed and well-nourished.  HENT:  Head: Normocephalic and atraumatic.  Eyes: Conjunctivae and EOM are normal.  Neck: Normal range of motion.  Cardiovascular: Normal rate.   Pulmonary/Chest: Effort normal. No respiratory distress. He has no wheezes. He exhibits tenderness (and also worse with movement).  Abdominal: Soft. He exhibits no distension.  Musculoskeletal: Normal range of motion. He exhibits edema (mild).  Neurological: He is alert.  Skin: Skin is warm and dry.  Nursing note and vitals reviewed.    ED Treatments / Results  Labs (all labs ordered are listed, but only abnormal results are displayed) Labs Reviewed  CBC WITH DIFFERENTIAL/PLATELET - Abnormal; Notable for the following:        Result Value   RBC 3.83 (*)    Hemoglobin 11.2 (*)    HCT 35.2 (*)    All other components within normal limits  COMPREHENSIVE METABOLIC PANEL - Abnormal; Notable for the following:    Chloride 98 (*)    Glucose, Bld 117 (*)    BUN 62 (*)    Creatinine, Ser 12.28 (*)    Calcium 8.8 (*)    AST 9 (*)  ALT 9 (*)    GFR calc non Af Amer 4 (*)    GFR calc Af Amer 5 (*)    All other components within normal limits  TROPONIN I - Abnormal; Notable for the following:    Troponin I 0.40 (*)    All other components within normal limits  BRAIN NATRIURETIC PEPTIDE - Abnormal; Notable for the following:    B Natriuretic Peptide 1,577.0 (*)    All other components within normal limits  TROPONIN I - Abnormal; Notable for the following:    Troponin I 0.80 (*)    All other components within normal limits  MRSA PCR SCREENING  TROPONIN I  TROPONIN I    EKG  EKG Interpretation  Date/Time:  Wednesday June 09 2016 06:51:09 EST Ventricular Rate:  78 PR Interval:    QRS Duration: 162 QT Interval:  423 QTC Calculation: 482 R Axis:   103 Text Interpretation:  Sinus rhythm Right bundle branch block Baseline wander in lead(s) V6 No significant change since last tracing 01 Jan 2016 Confirmed by KNAPP  MD-I, IVA ([id number]) on 06/09/2016 6:59:02 AM       Radiology Dg Chest 2 View  Result Date: 06/09/2016 CLINICAL DATA:  Left-sided chest pain for 1 day EXAM: CHEST  2 VIEW COMPARISON:  01/01/2016 FINDINGS: Cardiac shadow remains enlarged. The lungs are well aerated bilaterally. No focal infiltrate or sizable effusion is seen. No bony abnormality is noted. Previously seen bilateral infiltrates have cleared in the interval. IMPRESSION: No active cardiopulmonary disease. Electronically Signed   By: Alcide Clever M.D.   On: 06/09/2016 08:20    Procedures Procedures (including critical care time)  CRITICAL CARE Performed by: Marily Memos Total critical care time: 35 minutes Critical care time  was exclusive of separately billable procedures and treating other patients. Critical care was necessary to treat or prevent imminent or life-threatening deterioration. Critical care was time spent personally by me on the following activities: development of treatment plan with patient and/or surrogate as well as nursing, discussions with consultants, evaluation of patient's response to treatment, examination of patient, obtaining history from patient or surrogate, ordering and performing treatments and interventions, ordering and review of laboratory studies, ordering and review of radiographic studies, pulse oximetry and re-evaluation of patient's condition.   Medications Ordered in ED Medications  nitroGLYCERIN 50 mg in dextrose 5 % 250 mL (0.2 mg/mL) infusion (15 mcg/min Intravenous Rate/Dose Change 06/09/16 1207)  pantoprazole (PROTONIX) EC tablet 40 mg (40 mg Oral Given 06/09/16 1613)  amLODipine (NORVASC) tablet 10 mg (10 mg Oral Not Given 06/09/16 1613)  aspirin chewable tablet 81 mg (81 mg Oral Given 06/09/16 1611)  carvedilol (COREG) tablet 37.5 mg (37.5 mg Oral Given 06/09/16 1611)  torsemide (DEMADEX) tablet 40 mg (40 mg Oral Given 06/09/16 1612)  sevelamer carbonate (RENVELA) tablet 2,400 mg (2,400 mg Oral Given 06/09/16 1611)  atorvastatin (LIPITOR) tablet 20 mg (not administered)  multivitamin (RENA-VIT) tablet 1 tablet (1 tablet Oral Given 06/09/16 1612)  colchicine tablet 0.6 mg (not administered)  docusate sodium (COLACE) capsule 100 mg (not administered)  acetaminophen (TYLENOL) tablet 650 mg (not administered)  ondansetron (ZOFRAN) injection 4 mg (not administered)  heparin injection 5,000 Units (5,000 Units Subcutaneous Given 06/09/16 1613)  morphine 2 MG/ML injection 2 mg (not administered)  albuterol (PROVENTIL) (2.5 MG/3ML) 0.083% nebulizer solution 2.5 mg (not administered)  acetaminophen (TYLENOL) tablet 500-1,000 mg (not administered)    And  diphenhydrAMINE (BENADRYL)  capsule 25-50 mg (not administered)  HYDROmorphone (  DILAUDID) injection 1 mg (1 mg Intravenous Given 06/09/16 0753)  gi cocktail (Maalox,Lidocaine,Donnatal) (30 mLs Oral Given 06/09/16 0753)  ondansetron (ZOFRAN) injection 4 mg (4 mg Intravenous Given 06/09/16 0753)  famotidine (PEPCID) tablet 20 mg (20 mg Oral Given 06/09/16 0844)  aspirin chewable tablet 324 mg (324 mg Oral Given 06/09/16 0844)  heparin bolus via infusion 4,000 Units (4,000 Units Intravenous Bolus from Bag 06/09/16 1036)     Initial Impression / Assessment and Plan / ED Course  I have reviewed the triage vital signs and the nursing notes.  Pertinent labs & imaging results that were available during my care of the patient were reviewed by me and considered in my medical decision making (see chart for details).   Here with CP, higher troponin than normal, fluid overload. Plan for cardiology consult.  Cardiology recommended nitroglycerin drip and heparin drip however patient has recent GI bleed so will plan on just ntg for now.  Cards saw and recommended admission to medicine.  Plan for SDU, 2/2 NTG will need [id number] evaluation for dialysis as well.   Final Clinical Impressions(s) / ED Diagnoses   Final diagnoses:  Chest pain, unspecified type  NSTEMI (non-ST elevated myocardial infarction) (HCC)  Congestive heart failure, unspecified congestive heart failure chronicity, unspecified congestive heart failure type [facility])      Marily Memos, MD 06/09/16 1642

## 2016-06-09 NOTE — Progress Notes (Signed)
eLink Physician-Brief Progress Note Patient Name: [name] DOB: 1971/01/23 MRN: [id number]   Date of Service  06/09/2016  HPI/Events of Note  46 y.o. male with known CAD, CKD on hemodialysis MWF, CHF, HTN and diet controlled DM presents to ED by EMS with complaints of ongoing chest pain, patient having worsening Chest pain, given mso4, nitro infusion stopped due to lack of IV Still having chest pain, Rhythm strip reviewed No acute elevations noted  eICU Interventions  Primary care physician not available and not calling back, cardiologist not available, will need immediate transfer to [facility] ER ASAP for further assessment Care link notified by Jeani Hawking Nurse I have instructed nursing staff that patient needs STAT d/c summary from their PCP and transfer orders.  DX NSTEMI increasing Trop's, will need emergent cardiology assessment     Intervention Category Major Interventions: Other:  Erin Fulling 06/09/2016, 7:42 PM

## 2016-06-09 NOTE — Progress Notes (Signed)
Gave patient guide to patient for his information and answered questions he had.

## 2016-06-10 DIAGNOSIS — Z992 Dependence on renal dialysis: Secondary | ICD-10-CM

## 2016-06-10 DIAGNOSIS — I739 Peripheral vascular disease, unspecified: Secondary | ICD-10-CM | POA: Diagnosis present

## 2016-06-10 DIAGNOSIS — I209 Angina pectoris, unspecified: Secondary | ICD-10-CM | POA: Diagnosis not present

## 2016-06-10 DIAGNOSIS — I214 Non-ST elevation (NSTEMI) myocardial infarction: Secondary | ICD-10-CM | POA: Diagnosis present

## 2016-06-10 DIAGNOSIS — F1410 Cocaine abuse, uncomplicated: Secondary | ICD-10-CM | POA: Diagnosis present

## 2016-06-10 DIAGNOSIS — I509 Heart failure, unspecified: Secondary | ICD-10-CM | POA: Diagnosis not present

## 2016-06-10 DIAGNOSIS — F1210 Cannabis abuse, uncomplicated: Secondary | ICD-10-CM | POA: Diagnosis present

## 2016-06-10 DIAGNOSIS — N2581 Secondary hyperparathyroidism of renal origin: Secondary | ICD-10-CM | POA: Diagnosis present

## 2016-06-10 DIAGNOSIS — R748 Abnormal levels of other serum enzymes: Secondary | ICD-10-CM | POA: Diagnosis not present

## 2016-06-10 DIAGNOSIS — I4510 Unspecified right bundle-branch block: Secondary | ICD-10-CM | POA: Diagnosis present

## 2016-06-10 DIAGNOSIS — E7800 Pure hypercholesterolemia, unspecified: Secondary | ICD-10-CM | POA: Diagnosis not present

## 2016-06-10 DIAGNOSIS — R079 Chest pain, unspecified: Secondary | ICD-10-CM | POA: Diagnosis not present

## 2016-06-10 DIAGNOSIS — N184 Chronic kidney disease, stage 4 (severe): Secondary | ICD-10-CM | POA: Diagnosis not present

## 2016-06-10 DIAGNOSIS — I5023 Acute on chronic systolic (congestive) heart failure: Secondary | ICD-10-CM | POA: Diagnosis not present

## 2016-06-10 DIAGNOSIS — I132 Hypertensive heart and chronic kidney disease with heart failure and with stage 5 chronic kidney disease, or end stage renal disease: Secondary | ICD-10-CM | POA: Diagnosis present

## 2016-06-10 DIAGNOSIS — I2510 Atherosclerotic heart disease of native coronary artery without angina pectoris: Secondary | ICD-10-CM | POA: Diagnosis present

## 2016-06-10 DIAGNOSIS — I252 Old myocardial infarction: Secondary | ICD-10-CM | POA: Diagnosis not present

## 2016-06-10 DIAGNOSIS — E782 Mixed hyperlipidemia: Secondary | ICD-10-CM | POA: Diagnosis present

## 2016-06-10 DIAGNOSIS — I272 Pulmonary hypertension, unspecified: Secondary | ICD-10-CM | POA: Diagnosis present

## 2016-06-10 DIAGNOSIS — N186 End stage renal disease: Secondary | ICD-10-CM | POA: Diagnosis present

## 2016-06-10 DIAGNOSIS — I255 Ischemic cardiomyopathy: Secondary | ICD-10-CM | POA: Diagnosis not present

## 2016-06-10 DIAGNOSIS — I519 Heart disease, unspecified: Secondary | ICD-10-CM

## 2016-06-10 DIAGNOSIS — E1122 Type 2 diabetes mellitus with diabetic chronic kidney disease: Secondary | ICD-10-CM | POA: Diagnosis present

## 2016-06-10 DIAGNOSIS — E1151 Type 2 diabetes mellitus with diabetic peripheral angiopathy without gangrene: Secondary | ICD-10-CM | POA: Diagnosis present

## 2016-06-10 DIAGNOSIS — R071 Chest pain on breathing: Secondary | ICD-10-CM | POA: Diagnosis present

## 2016-06-10 DIAGNOSIS — I428 Other cardiomyopathies: Secondary | ICD-10-CM

## 2016-06-10 DIAGNOSIS — I5043 Acute on chronic combined systolic (congestive) and diastolic (congestive) heart failure: Secondary | ICD-10-CM | POA: Diagnosis not present

## 2016-06-10 DIAGNOSIS — E8889 Other specified metabolic disorders: Secondary | ICD-10-CM | POA: Diagnosis present

## 2016-06-10 DIAGNOSIS — I10 Essential (primary) hypertension: Secondary | ICD-10-CM | POA: Diagnosis not present

## 2016-06-10 LAB — RENAL FUNCTION PANEL
ANION GAP: 15 (ref 5–15)
Albumin: 3.3 g/dL — ABNORMAL LOW (ref 3.5–5.0)
BUN: 69 mg/dL — ABNORMAL HIGH (ref 6–20)
CHLORIDE: 98 mmol/L — AB (ref 101–111)
CO2: 22 mmol/L (ref 22–32)
Calcium: 8.6 mg/dL — ABNORMAL LOW (ref 8.9–10.3)
Creatinine, Ser: 13.47 mg/dL — ABNORMAL HIGH (ref 0.61–1.24)
GFR calc Af Amer: 4 mL/min — ABNORMAL LOW (ref >60)
GFR calc non Af Amer: 4 mL/min — ABNORMAL LOW (ref >60)
GLUCOSE: 81 mg/dL (ref 65–99)
POTASSIUM: 4.7 mmol/L (ref 3.5–5.1)
Phosphorus: 8.1 mg/dL — ABNORMAL HIGH (ref 2.5–4.6)
Sodium: 135 mmol/L (ref 135–145)

## 2016-06-10 LAB — GLUCOSE, CAPILLARY
GLUCOSE-CAPILLARY: 89 mg/dL (ref 65–99)
GLUCOSE-CAPILLARY: 115 mg/dL — AB (ref 65–99)
Glucose-Capillary: 67 mg/dL (ref 65–99)

## 2016-06-10 LAB — PROTIME-INR
INR: 1.28
Prothrombin Time: 16.1 seconds — ABNORMAL HIGH (ref 11.4–15.2)

## 2016-06-10 LAB — APTT: aPTT: 61 seconds — ABNORMAL HIGH (ref 24–36)

## 2016-06-10 LAB — LIPID PANEL
CHOL/HDL RATIO: 3 ratio
Cholesterol: 84 mg/dL (ref 0–200)
HDL: 28 mg/dL — AB (ref >40)
LDL CALC: 40 mg/dL (ref 0–99)
TRIGLYCERIDES: 78 mg/dL (ref <150)
VLDL: 16 mg/dL (ref 0–40)

## 2016-06-10 LAB — CBC
HEMATOCRIT: 36.1 % — AB (ref 39.0–52.0)
HEMOGLOBIN: 10.8 g/dL — AB (ref 13.0–17.0)
MCH: 28 pg (ref 26.0–34.0)
MCHC: 29.9 g/dL — AB (ref 30.0–36.0)
MCV: 93.5 fL (ref 78.0–100.0)
Platelets: 160 10*3/uL (ref 150–400)
RBC: 3.86 MIL/uL — ABNORMAL LOW (ref 4.22–5.81)
RDW: 14.2 % (ref 11.5–15.5)
WBC: 4.7 10*3/uL (ref 4.0–10.5)

## 2016-06-10 LAB — MRSA PCR SCREENING: MRSA by PCR: NEGATIVE

## 2016-06-10 LAB — TROPONIN I: Troponin I: 1.91 ng/mL (ref <0.03)

## 2016-06-10 LAB — HEPARIN LEVEL (UNFRACTIONATED)

## 2016-06-10 MED ORDER — HEPARIN (PORCINE) IN NACL 100-0.45 UNIT/ML-% IJ SOLN: 1250.0000 [IU]/h | INTRAMUSCULAR | Status: DC

## 2016-06-10 MED ORDER — HEPARIN (PORCINE) IN NACL 100-0.45 UNIT/ML-% IJ SOLN
1750.0000 [IU]/h | INTRAMUSCULAR | Status: DC
  Administered 2016-06-10: 1250 [IU]/h via INTRAVENOUS
  Administered 2016-06-11: 1600 [IU]/h via INTRAVENOUS
  Filled 2016-06-10 (×2): qty 250

## 2016-06-10 MED ORDER — NITROGLYCERIN 2 % TD OINT
1.0000 [in_us] | TOPICAL_OINTMENT | Freq: Four times a day (QID) | TRANSDERMAL | Status: AC
  Administered 2016-06-10 – 2016-06-12 (×10): 1 [in_us] via TOPICAL
  Filled 2016-06-10: qty 30
  Filled 2016-06-10 (×2): qty 1

## 2016-06-10 MED ORDER — HEPARIN BOLUS VIA INFUSION
3000.0000 [IU] | Freq: Once | INTRAVENOUS | Status: AC
  Administered 2016-06-10: 3000 [IU] via INTRAVENOUS
  Filled 2016-06-10: qty 3000

## 2016-06-10 MED ORDER — HEPARIN BOLUS VIA INFUSION: 4000.0000 [IU] | Freq: Once | INTRAVENOUS | Status: DC

## 2016-06-10 NOTE — Progress Notes (Signed)
ANTICOAGULATION CONSULT NOTE -  Pharmacy Consult for HEPARIN Indication: chest pain/ACS  Allergies  Allergen Reactions  . Vytorin [Ezetimibe-Simvastatin]     Weakness, muscle aches bad.  . Gabapentin Other (See Comments)    Twitching  . Oxycodone Nausea And Vomiting   Patient Measurements: Height: 5\' 8"  (172.7 cm) Weight: 228 lb 2.8 oz (103.5 kg) IBW/kg (Calculated) : 68.4 HEPARIN DW (KG): 90.3  Vital Signs: Temp: 98.8 F (37.1 C) (02/01 1600) Temp Source: Oral (02/01 1600) BP: 94/39 (02/01 1600) Pulse Rate: 79 (02/01 0800)  Labs:  Recent Labs  06/09/16 0740  06/09/16 1649 06/09/16 2017 06/10/16 0448 06/10/16 1545  HGB 11.2*  --   --   --  10.8*  --   HCT 35.2*  --   --   --  36.1*  --   PLT 180  --   --   --  160  --   APTT  --   --   --   --   --  61*  LABPROT  --   --   --   --   --  16.1*  INR  --   --   --   --   --  1.28  HEPARINUNFRC  --   --   --   --   --  <0.10*  CREATININE 12.28*  --   --   --  13.47*  --   TROPONINI 0.40*  < > 1.14* 1.48* 1.91*  --   < > = values in this interval not displayed. Estimated Creatinine Clearance: 8 mL/min (by C-G formula based on SCr of 13.47 mg/dL (H)).  Medical History: Past Medical History:  Diagnosis Date  . Anemia   . Anginal pain (HCC)   . CHF (congestive heart failure) (HCC)   . CKD (chronic kidney disease) stage 3, GFR 30-59 ml/min    on dialysis M/W/F - started March 2016  . Coronary atherosclerosis of native coronary artery    100% apical LAD and distal OM1, Rx medically - 11/13  . Depression   . Essential hypertension, benign   . Gouty arthritis   . History of blood transfusion 1988   "arm went thru glass window" (10/12/2012)  . Ischemic cardiomyopathy    LVEF 40-45%  . Mixed hyperlipidemia   . NSTEMI (non-ST elevated myocardial infarction) (HCC) 02/2012 and 10/2012  . PAD (peripheral artery disease) (HCC)    Normal ABIs, 2011; focal left EIA stenosis; mild bilateral distal SFA disease, 2008  . RBBB    . Shortness of breath    when he has fluid he gets sob  . Type 2 diabetes mellitus (HCC)    diet controlled   Medications:  Prescriptions Prior to Admission  Medication Sig Dispense Refill Last Dose  . albuterol (PROVENTIL HFA;VENTOLIN HFA) 108 (90 BASE) MCG/ACT inhaler Inhale 2 puffs into the lungs every 6 (six) hours as needed for wheezing or shortness of breath. 1 Inhaler 2 unknown  . amLODipine (NORVASC) 10 MG tablet Take 1 tablet (10 mg total) by mouth daily. 90 tablet 3 06/08/2016 at Unknown time  . aspirin 81 MG chewable tablet Chew 81 mg by mouth daily.   06/09/2016 at Unknown time  . atorvastatin (LIPITOR) 20 MG tablet Take 20 mg by mouth daily.   06/08/2016 at Unknown time  . carvedilol (COREG) 25 MG tablet Take 37.5 mg by mouth 2 (two) times daily with a meal.   06/08/2016 at Unknown time  . colchicine 0.6  MG tablet Take 0.6 mg by mouth 2 (two) times daily as needed (gout).    unknown  . diphenhydramine-acetaminophen (TYLENOL PM) 25-500 MG TABS Take 1-2 tablets by mouth at bedtime as needed (for pain and sleep).   unknown  . docusate sodium (COLACE) 100 MG capsule Take 3 mg by mouth 2 (two) times daily as needed for mild constipation.    06/08/2016 at Unknown time  . esomeprazole (NEXIUM) 40 MG capsule Take 40 mg by mouth daily at 12 noon.   06/08/2016 at Unknown time  . isosorbide mononitrate (IMDUR) 30 MG 24 hr tablet TAKE (1) TABLET TWICE DAILY. 60 tablet 3 06/08/2016 at Unknown time  . lidocaine-prilocaine (EMLA) cream Apply 1 application topically as needed. For port access  10 06/08/2016 at Unknown time  . multivitamin (RENA-VIT) TABS tablet Take 1 tablet by mouth daily.   Past Week at Unknown time  . nitroGLYCERIN (NITROSTAT) 0.4 MG SL tablet Place 1 tablet (0.4 mg total) under the tongue every 5 (five) minutes as needed for chest pain. 25 tablet 0 06/09/2016 at Unknown time  . sevelamer carbonate (RENVELA) 800 MG tablet Take 2,400 mg by mouth 2 (two) times daily.  4 06/08/2016 at  Unknown time  . torsemide (DEMADEX) 20 MG tablet Take 40 mg by mouth 3 (three) times daily. Fluid   06/08/2016 at Unknown time  . pantoprazole (PROTONIX) 40 MG tablet Take 1 tablet (40 mg total) by mouth 2 (two) times daily. 60 tablet 2     Assessment: 46yo male with c/o left sided CP with known hx of CAD, cath in 03/2012 severe distal disease managed medically. Most recent cardiac cath [facility] 04/2015, PAD status post right femoropopliteal bypass 07/2013, hypertension, hyperlipidemia, and ESRD on hemodialysis.Had UGI bleed 12/2015 secondary to antral erosions.  Was Holding heparin infusion with history of recent GI bleeding, but troponins have continue to rise and patient continues to have achy chest pain. Plan to restart heparin. Heparin sq 5000 units given at 0630, will not give bolus.  Goal of Therapy:  Heparin level 0.3-0.7 units/ml Monitor platelets by anticoagulation protocol: Yes   Plan:  Heparin  Bolus 3000 units, then increase  Heparin infusion at 1600 units/hr Heparin level in 6-8 hours then daily CBC daily while on Heparin  Elder Cyphers, BS Loura Back, BCPS Clinical Pharmacist Pager [phone] 06/10/2016,4:21 PM

## 2016-06-10 NOTE — Progress Notes (Addendum)
ANTICOAGULATION CONSULT NOTE - Initial Consult  Pharmacy Consult for HEPARIN Indication: chest pain/ACS  Allergies  Allergen Reactions  . Vytorin [Ezetimibe-Simvastatin]     Weakness, muscle aches bad.  . Gabapentin Other (See Comments)    Twitching  . Oxycodone Nausea And Vomiting   Patient Measurements: Height: 5\' 8"  (172.7 cm) Weight: 228 lb 2.8 oz (103.5 kg) IBW/kg (Calculated) : 68.4 HEPARIN DW (KG): 90.3  Vital Signs: Temp: 97.9 F (36.6 C) (02/01 0400) Temp Source: Oral (02/01 0400) BP: 109/74 (02/01 0600) Pulse Rate: 76 (02/01 0600)  Labs:  Recent Labs  06/09/16 0740 06/09/16 1419 06/09/16 1649 06/09/16 2017 06/10/16 0448  HGB 11.2*  --   --   --  10.8*  HCT 35.2*  --   --   --  36.1*  PLT 180  --   --   --  160  CREATININE 12.28*  --   --   --  13.47*  TROPONINI 0.40* 0.80* 1.14* 1.48*  --    Estimated Creatinine Clearance: 8 mL/min (by C-G formula based on SCr of 13.47 mg/dL (H)).  Medical History: Past Medical History:  Diagnosis Date  . Anemia   . Anginal pain (HCC)   . CHF (congestive heart failure) (HCC)   . CKD (chronic kidney disease) stage 3, GFR 30-59 ml/min    on dialysis M/W/F - started March 2016  . Coronary atherosclerosis of native coronary artery    100% apical LAD and distal OM1, Rx medically - 11/13  . Depression   . Essential hypertension, benign   . Gouty arthritis   . History of blood transfusion 1988   "arm went thru glass window" (10/12/2012)  . Ischemic cardiomyopathy    LVEF 40-45%  . Mixed hyperlipidemia   . NSTEMI (non-ST elevated myocardial infarction) (HCC) 02/2012 and 10/2012  . PAD (peripheral artery disease) (HCC)    Normal ABIs, 2011; focal left EIA stenosis; mild bilateral distal SFA disease, 2008  . RBBB   . Shortness of breath    when he has fluid he gets sob  . Type 2 diabetes mellitus (HCC)    diet controlled   Medications:  Prescriptions Prior to Admission  Medication Sig Dispense Refill Last Dose  .  albuterol (PROVENTIL HFA;VENTOLIN HFA) 108 (90 BASE) MCG/ACT inhaler Inhale 2 puffs into the lungs every 6 (six) hours as needed for wheezing or shortness of breath. 1 Inhaler 2 unknown  . amLODipine (NORVASC) 10 MG tablet Take 1 tablet (10 mg total) by mouth daily. 90 tablet 3 06/08/2016 at Unknown time  . aspirin 81 MG chewable tablet Chew 81 mg by mouth daily.   06/09/2016 at Unknown time  . atorvastatin (LIPITOR) 20 MG tablet Take 20 mg by mouth daily.   06/08/2016 at Unknown time  . carvedilol (COREG) 25 MG tablet Take 37.5 mg by mouth 2 (two) times daily with a meal.   06/08/2016 at Unknown time  . colchicine 0.6 MG tablet Take 0.6 mg by mouth 2 (two) times daily as needed (gout).    unknown  . diphenhydramine-acetaminophen (TYLENOL PM) 25-500 MG TABS Take 1-2 tablets by mouth at bedtime as needed (for pain and sleep).   unknown  . docusate sodium (COLACE) 100 MG capsule Take 3 mg by mouth 2 (two) times daily as needed for mild constipation.    06/08/2016 at Unknown time  . esomeprazole (NEXIUM) 40 MG capsule Take 40 mg by mouth daily at 12 noon.   06/08/2016 at Unknown time  .  isosorbide mononitrate (IMDUR) 30 MG 24 hr tablet TAKE (1) TABLET TWICE DAILY. 60 tablet 3 06/08/2016 at Unknown time  . lidocaine-prilocaine (EMLA) cream Apply 1 application topically as needed. For port access  10 06/08/2016 at Unknown time  . multivitamin (RENA-VIT) TABS tablet Take 1 tablet by mouth daily.   Past Week at Unknown time  . nitroGLYCERIN (NITROSTAT) 0.4 MG SL tablet Place 1 tablet (0.4 mg total) under the tongue every 5 (five) minutes as needed for chest pain. 25 tablet 0 06/09/2016 at Unknown time  . sevelamer carbonate (RENVELA) 800 MG tablet Take 2,400 mg by mouth 2 (two) times daily.  4 06/08/2016 at Unknown time  . torsemide (DEMADEX) 20 MG tablet Take 40 mg by mouth 3 (three) times daily. Fluid   06/08/2016 at Unknown time  . pantoprazole (PROTONIX) 40 MG tablet Take 1 tablet (40 mg total) by mouth 2 (two)  times daily. 60 tablet 2     Assessment: 46yo male with c/o left sided CP with known hx of CAD, cath in 03/2012 severe distal disease managed medically. Most recent cardiac cath [facility] 04/2015, PAD status post right femoropopliteal bypass 07/2013, hypertension, hyperlipidemia, and ESRD on hemodialysis.Had UGI bleed 12/2015 secondary to antral erosions.  Was Holding heparin infusion with history of recent GI bleeding, but troponins have continue to rise and patient continues to have achy chest pain. Plan to restart heparin. Heparin sq 5000 units given at 0630, will not give bolus.  Goal of Therapy:  Heparin level 0.3-0.7 units/ml Monitor platelets by anticoagulation protocol: Yes   Plan:  Restart Heparin infusion at 1250 units/hr Heparin level in 6-8 hours then daily CBC daily while on Heparin  Elder Cyphers, BS Loura Back, BCPS Clinical Pharmacist Pager [phone] 06/10/2016,9:16 AM

## 2016-06-10 NOTE — Progress Notes (Addendum)
PROGRESS NOTE    [name]  QMV:[id number]  DOB: 1970/08/07  DOA: 06/09/2016 PCP: Louie Boston, MD   Hospital course: [name] is a 46 y.o. male with known CAD, CKD on hemodialysis MWF, CHF, HTN and diet controlled DM presents to ED by EMS with complaints of ongoing chest pain.  He reported that he took 3 NTG with no relief.  He also took 3-4 baby aspirin with no significant relief.    Assessment & Plan:   1. Chest pain - high risk for cardiac etiology, appreciate cardiology team assistance, admit to SDU on IV NTG infusion,  Holding heparin infusion with history of recent GI bleeding.  Cycle troponin tests. Continue aspirin.  Morphine ordered as needed for pain. Cardiology feels it is safe to manage patient medically here but to transfer to [facility] if troponin becomes markedly elevated.   2. NSTEMI - mild bump in troponins, discussed with cardiology team, transferring to [facility] for cath.     3. ESRD on HD - Consult nephrology service for hemodialysis.  4. Hyperlipidemia - fasting lipids pending, continue statin. 5. Type 2 DM - has been diet controlled, will follow. 6. Essential hypertension - low but stable, following closely on NTG infusion.  7. Poor vascular access - will need a central line, orders placed 2/1.  Pt has been refusing central line.  RN trying again to get peripheral access.  8. PVD - stable, pt is s/p femoropopliteal bypass 07/2013 9. Ischemic cardiomyopathy - Echo done 1/31 shows severely reduced EF 25% and grade 2 DD.  Pt being transferred to [facility] for cardiology care and catheterization.    DVT prophylaxis: heparin Code Status: full Family Communication: bedside Disposition Plan: TBD   Consultants:  cardiology  Procedures:  Echocardiogram 1/31  Subjective: Pt reports that the chest pain has resolved.   Objective: Vitals:   06/10/16 0300 06/10/16 0400 06/10/16 0500 06/10/16 0600  BP: (!) 107/46 (!) 119/49 (!) 36/21 109/74  Pulse: 71 75 76 76    Resp: 11 (!) 9 14 15   Temp:  97.9 F (36.6 C)    TempSrc:  Oral    SpO2: 97% 94% 98% 91%  Weight:   103.5 kg (228 lb 2.8 oz)   Height:        Intake/Output Summary (Last 24 hours) at 06/10/16 0748 Last data filed at 06/09/16 2100  Gross per 24 hour  Intake           303.33 ml  Output                0 ml  Net           303.33 ml   Filed Weights   06/09/16 0646 06/09/16 1407 06/10/16 0500  Weight: 99.8 kg (220 lb) 101.5 kg (223 lb 12.3 oz) 103.5 kg (228 lb 2.8 oz)   Exam:  General exam: chronically ill appearing, awake, alert, NAD.  Respiratory system: Clear. No increased work of breathing. Cardiovascular system: S1 & S2 heard.  Gastrointestinal system: Abdomen is nondistended, soft and nontender. Normal bowel sounds heard. Central nervous system: Alert and oriented. No focal neurological deficits. Extremities: no cyanosis.  Data Reviewed: Basic Metabolic Panel:  Recent Labs Lab 06/09/16 0740 06/10/16 0448  NA 137 135  K 4.8 4.7  CL 98* 98*  CO2 26 22  GLUCOSE 117* 81  BUN 62* 69*  CREATININE 12.28* 13.47*  CALCIUM 8.8* 8.6*  PHOS  --  8.1*   Liver Function  Tests:  Recent Labs Lab 06/09/16 0740 06/10/16 0448  AST 9*  --   ALT 9*  --   ALKPHOS 42  --   BILITOT 0.8  --   PROT 6.9  --   ALBUMIN 3.5 3.3*   No results for input(s): LIPASE, AMYLASE in the last 168 hours. No results for input(s): AMMONIA in the last 168 hours. CBC:  Recent Labs Lab 06/09/16 0740 06/10/16 0448  WBC 5.4 4.7  NEUTROABS 3.9  --   HGB 11.2* 10.8*  HCT 35.2* 36.1*  MCV 91.9 93.5  PLT 180 160   Cardiac Enzymes:  Recent Labs Lab 06/09/16 0740 06/09/16 1419 06/09/16 1649 06/09/16 2017  TROPONINI 0.40* 0.80* 1.14* 1.48*   CBG (last 3)   Recent Labs  06/09/16 1713 06/09/16 2215  GLUCAP 93 87   Recent Results (from the past 240 hour(s))  MRSA PCR Screening     Status: None   Collection Time: 06/09/16  3:01 PM  Result Value Ref Range Status   MRSA by PCR  NEGATIVE NEGATIVE Final    Comment:        The GeneXpert MRSA Assay (FDA approved for NASAL specimens only), is one component of a comprehensive MRSA colonization surveillance program. It is not intended to diagnose MRSA infection nor to guide or monitor treatment for MRSA infections.     Studies: Dg Chest 2 View  Result Date: 06/09/2016 CLINICAL DATA:  Left-sided chest pain for 1 day EXAM: CHEST  2 VIEW COMPARISON:  01/01/2016 FINDINGS: Cardiac shadow remains enlarged. The lungs are well aerated bilaterally. No focal infiltrate or sizable effusion is seen. No bony abnormality is noted. Previously seen bilateral infiltrates have cleared in the interval. IMPRESSION: No active cardiopulmonary disease. Electronically Signed   By: Alcide Clever M.D.   On: 06/09/2016 08:20   Scheduled Meds: . amLODipine  10 mg Oral Daily  . aspirin  81 mg Oral Daily  . atorvastatin  20 mg Oral q1800  . carvedilol  37.5 mg Oral BID WC  . heparin  5,000 Units Subcutaneous Q8H  . multivitamin  1 tablet Oral Daily  . nitroGLYCERIN  1 inch Topical Q6H  . pantoprazole  40 mg Oral BID  . sevelamer carbonate  2,400 mg Oral BID WC  . torsemide  40 mg Oral TID   Continuous Infusions: . nitroGLYCERIN Stopped (06/09/16 2100)     Principal Problem:   Chest pain Active Problems:   Ischemic cardiomyopathy   CKD (chronic kidney disease), stage IV (HCC)   Elevated troponin I level   Hyperlipidemia   Type 2 diabetes mellitus with diabetic chronic kidney disease (HCC)   PAD (peripheral artery disease) (HCC)   Tobacco abuse   Essential hypertension, benign   Coronary atherosclerosis of native coronary artery   PVD (peripheral vascular disease) (HCC)   End stage renal disease (HCC)   Chest pain with high risk for cardiac etiology  Critical Care Time spent: 40 mins  Standley Dakins, MD, FAAFP [facility] Pager [phone]  If 7PM-7AM, please contact night-coverage [email or web address] Password  [email or web address] 06/10/2016, 7:48 AM    LOS: 0 days

## 2016-06-10 NOTE — Progress Notes (Signed)
Called report to [facility] RN at [facility] who will be taking care of patient on 4 E-23. Patient was transported by [facility]. Only belongings patient had was Cell phone, charger cable, and underwear. Paperwork was printed and given to transport team.

## 2016-06-10 NOTE — Progress Notes (Signed)
Dr. Kristian Covey and myself talked to patient due to no IV access. Dr. Kristian Covey told patient he needed an IJ line to save right arm for future dialysis treatment if needed. [name] stated he did not want an IJ or any IV access next to his neck and would only allow an PICC in his arm. Dr. Kristian Covey said if that is the only access patient would allow, Dr. Kristian Covey would not stop PICC placement. [facility] also talked to patient and the only line he would accept is PICC placement. Patient knows the risk and signed consent for PICC placement.

## 2016-06-10 NOTE — Progress Notes (Addendum)
Progress Note  Patient Name: [name] Date of Encounter: 06/10/2016  Primary Cardiologist: Dina Rich MD  Subjective  Continues to have achy chest pain with deep breaths and upper body movement.  He also states he has not had dialysis since Monday 06/07/2016 .    Inpatient Medications    Scheduled Meds: . amLODipine  10 mg Oral Daily  . aspirin  81 mg Oral Daily  . atorvastatin  20 mg Oral q1800  . carvedilol  37.5 mg Oral BID WC  . heparin  5,000 Units Subcutaneous Q8H  . multivitamin  1 tablet Oral Daily  . nitroGLYCERIN  1 inch Topical Q6H  . pantoprazole  40 mg Oral BID  . sevelamer carbonate  2,400 mg Oral BID WC  . torsemide  40 mg Oral TID   Continuous Infusions: . nitroGLYCERIN Stopped (06/09/16 2100)   PRN Meds: acetaminophen **AND** diphenhydrAMINE, acetaminophen, albuterol, colchicine, docusate sodium, morphine injection, ondansetron (ZOFRAN) IV   Vital Signs    Vitals:   06/10/16 0300 06/10/16 0400 06/10/16 0500 06/10/16 0600  BP: (!) 107/46 (!) 119/49 (!) 36/21 109/74  Pulse: 71 75 76 76  Resp: 11 (!) 9 14 15   Temp:  97.9 F (36.6 C)    TempSrc:  Oral    SpO2: 97% 94% 98% 91%  Weight:   228 lb 2.8 oz (103.5 kg)   Height:        Intake/Output Summary (Last 24 hours) at 06/10/16 1610 Last data filed at 06/09/16 2100  Gross per 24 hour  Intake           303.33 ml  Output                0 ml  Net           303.33 ml   Filed Weights   06/09/16 0646 06/09/16 1407 06/10/16 0500  Weight: 220 lb (99.8 kg) 223 lb 12.3 oz (101.5 kg) 228 lb 2.8 oz (103.5 kg)    Telemetry    - Personally Reviewed  ECG    - Personally Reviewed  Physical Exam   GEN: Continues to complain of chest pain Neck: No JVD Cardiac: RRR,1/6 systolic murmur. rubs, or gallops.  Respiratory: Clear to auscultation bilaterally. GI: Soft, nontender, non-distended  MS: No edema; No deformity. Neuro:  Nonfocal  Psych: Normal affect   Labs    Chemistry Recent  Labs Lab 06/09/16 0740 06/10/16 0448  NA 137 135  K 4.8 4.7  CL 98* 98*  CO2 26 22  GLUCOSE 117* 81  BUN 62* 69*  CREATININE 12.28* 13.47*  CALCIUM 8.8* 8.6*  PROT 6.9  --   ALBUMIN 3.5 3.3*  AST 9*  --   ALT 9*  --   ALKPHOS 42  --   BILITOT 0.8  --   GFRNONAA 4* 4*  GFRAA 5* 4*  ANIONGAP 13 15     Hematology Recent Labs Lab 06/09/16 0740 06/10/16 0448  WBC 5.4 4.7  RBC 3.83* 3.86*  HGB 11.2* 10.8*  HCT 35.2* 36.1*  MCV 91.9 93.5  MCH 29.2 28.0  MCHC 31.8 29.9*  RDW 14.5 14.2  PLT 180 160    Cardiac Enzymes Recent Labs Lab 06/09/16 0740 06/09/16 1419 06/09/16 1649 06/09/16 2017  TROPONINI 0.40* 0.80* 1.14* 1.48*   No results for input(s): TROPIPOC in the last 168 hours.   BNP Recent Labs Lab 06/09/16 0740  BNP 1,577.0*      Radiology  Dg Chest 2 View  Result Date: 06/09/2016 CLINICAL DATA:  Left-sided chest pain for 1 day EXAM: CHEST  2 VIEW COMPARISON:  01/01/2016 FINDINGS: Cardiac shadow remains enlarged. The lungs are well aerated bilaterally. No focal infiltrate or sizable effusion is seen. No bony abnormality is noted. Previously seen bilateral infiltrates have cleared in the interval. IMPRESSION: No active cardiopulmonary disease. Electronically Signed   By: Alcide Clever M.D.   On: 06/09/2016 08:20    Cardiac Studies   Echocardiogram 06/09/2016 Left ventricle: The cavity size was moderately to severely   dilated. Wall thickness was increased in a pattern of mild LVH.   Systolic function was severely reduced. The estimated ejection   fraction was 25%. Diffuse hypokinesis. Features are consistent   with a pseudonormal left ventricular filling pattern, with   concomitant abnormal relaxation and increased filling pressure   (grade 2 diastolic dysfunction). Doppler parameters are   consistent with high ventricular filling pressure. - Aortic valve: Mildly to moderately calcified annulus. Trileaflet. - Mitral valve: Mildly thickened  leaflets . There was mild   regurgitation. - Left atrium: The atrium was mildly dilated. - Right ventricle: Systolic function was moderately reduced. - Right atrium: The atrium was mildly dilated. - Tricuspid valve: There was moderate-severe regurgitation. - Pulmonary arteries: PA peak pressure: 48 mm Hg (S). - Systemic veins: IVC was dilated with normal respiratory   variation. Estimated CVP 8 mmHg. - Pericardium, extracardiac: A small pericardial effusion was   identified.  Cath 03/2012 Hemodynamic Findings: Central aortic pressure: 150/100  Left ventricular pressure: 148/17/21  Angiographic Findings:  Left main: No obstructive disease.  Left Anterior Descending Artery: Large vessel that coursed to the apex. The proximal and mid vessel had serial 20% lesions. The distal vessel had 100% occlusion just before the apex. The vessel was 1.75 mm in this location. The diagonal branch was small in caliber and patent with mild diffuse disease.  Circumflex Artery: Large caliber vessel with large first obtuse marginal branch. There is a 30% stenosis in the proximal portion of the obtuse marginal branch. The distal segment of the OM branch is 100% occluded.  Right Coronary Artery: Large, dominant vessel with 40% proximal stenosis, long tubular 40% mid stenosis, serial 25% distal stenoses. The PDA is very small caliber and has diffuse 80% stenosis. The Posterolateral branch is small in caliber and has diffuse 50% stenosis.  Left Ventricular Angiogram: Deferred.  Impression:  1. 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 No focal lesions for PCI  2. NSTEMI secondary to above.  Recommendations: Will continue medical management with aggressive risk factor reduction. He will need to stop smoking. We will continue statin, beta blocker. Will continue ASA and will load with Plavix. Will check echo to assess LVEF.  Complications:  None. The patient tolerated the procedure well.   03/2012 echo  LVEF 40-45%, mild LVH, multiple WMAs, grade II diastolic dysfunction,   09/2013 Carotid US Bilateral 1-39% ICA stenosis, patent antegrade vertebrals  02/2014 Lexiscan MPI IMPRESSION: 1. Inferior wall infarct extending from apex to base. No reversible ischemia.  2.Severe inferior wall hypokinesis.  3. Left ventricular ejection fraction 33%  4. High-risk stress test findings based upon extent of myocardial scar and severely reduced left ventricular systolic function.  Patient Profile     46 y.o. male with known hx of CAD, cath in 03/2012 severe distal disease too small for intervention, multiple mild to moderate lesions manage  medically. Most recent cardiac cath [facility] 04/2015, PAD status post right femoropopliteal bypass 07/2013, hypertension, hyperlipidemia, and ESRD on hemodialysis.Had UGI bleed 12/2015 secondary to antral erosions. Presented to [facility] in the setting of chest pain. EKG NSR with RBBB.    Assessment & Plan    1. NSTEMI: Troponin continues to rise, peaked at 1.48. Patient continues to have chest pain. Not currently on NTG gtt due to hypotension but remains on NTG paste.. Plans to transfer to [facility] per Dr. Laural Benes, to be accepted by Hospitalist service. Notified [facility] cardiology team at [facility] to put patient on rounding list and to be called when patient arrives to discuss timing or necessity of proceeding with cardiac cath. Of note, patient refuses IV access at this time. He has ESRD and is on dialysis, timing to coinside with dialysis will be discussed once patient arrives to [facility].   2. CAD: History of triple vessel CAD with occlusion of the apical LAD and distal portion of the first obtuse marginal branch per cardiac cath in 2013. Apparent repeat cath in 2015 with no current documentation of findings per Care Everywhere. Continue coreg 37.5 mg BID, heparin SQ and statin therapy..  3.  Systolic Dysfunction: Reduced EF per echo completed on 06/09/2016. he estimated ejection   fraction was 25%. Diffuse hypokinesis. Features are consistent with a pseudonormal left ventricular filling pattern, with   concomitant abnormal relaxation and increased filling pressure (grade 2 diastolic dysfunction). Continue BP control. On amlodipine.   4. ESRD: Patient has not had dialysis since 06/07/2016, and is overdue for this. Creatinine rising to 13.47 this am. Nephrology to see when he arrives at [facility].   Signed, Joni Reining, NP  06/10/2016, 8:07 AM    The patient was seen and examined, and I agree with the history, physical exam, assessment and plan as documented above, with modifications as noted below. Patient says chest pain "down to a 4 out of 50". Plan is to transfer to [facility] today for hemodialysis and ultimately coronary angiography, given rising troponins and severely reduced LVEF. I have initiated IV heparin (he refused IV's yesterday). Continue ASA, Lipitor, nitro paste, and Coreg.  Prentice Docker, MD, FACC  35 minutes was spent face-to-face with the patient, 20 minutes of which was spent in counseling and coordination of care with regards to the patient's illness and options for treatment.   06/10/2016 10:32 AM

## 2016-06-10 NOTE — Progress Notes (Signed)
06/10/2016 9:03 AM  I called and signed out with Dr. Selena Batten my colleague at [facility] who will assume care for patient when he arrives.  He will notify cardiology team and nephrology when he arrives.   Maryln Manuel, MD

## 2016-06-11 ENCOUNTER — Encounter (HOSPITAL_COMMUNITY)
Admission: EM | Disposition: A | Discharge status: Home / Self Care | Payer: Self-pay | Source: Home / Self Care | Attending: Internal Medicine

## 2016-06-11 DIAGNOSIS — R748 Abnormal levels of other serum enzymes: Secondary | ICD-10-CM

## 2016-06-11 DIAGNOSIS — N184 Chronic kidney disease, stage 4 (severe): Secondary | ICD-10-CM

## 2016-06-11 DIAGNOSIS — I509 Heart failure, unspecified: Secondary | ICD-10-CM

## 2016-06-11 HISTORY — PX: CARDIAC CATHETERIZATION: SHX172

## 2016-06-11 LAB — POCT I-STAT 3, ART BLOOD GAS (G3+)
ACID-BASE DEFICIT: 2 mmol/L (ref 0.0–2.0)
Bicarbonate: 24.4 mmol/L (ref 20.0–28.0)
O2 SAT: 96 %
PH ART: 7.339 — AB (ref 7.350–7.450)
TCO2: 26 mmol/L (ref 0–100)
pCO2 arterial: 45.3 mmHg (ref 32.0–48.0)
pO2, Arterial: 87 mmHg (ref 83.0–108.0)

## 2016-06-11 LAB — RENAL FUNCTION PANEL
ALBUMIN: 3.2 g/dL — AB (ref 3.5–5.0)
Anion gap: 12 (ref 5–15)
BUN: 80 mg/dL — AB (ref 6–20)
CALCIUM: 8.4 mg/dL — AB (ref 8.9–10.3)
CO2: 25 mmol/L (ref 22–32)
CREATININE: 14.57 mg/dL — AB (ref 0.61–1.24)
Chloride: 100 mmol/L — ABNORMAL LOW (ref 101–111)
GFR calc Af Amer: 4 mL/min — ABNORMAL LOW (ref >60)
GFR calc non Af Amer: 3 mL/min — ABNORMAL LOW (ref >60)
GLUCOSE: 88 mg/dL (ref 65–99)
PHOSPHORUS: 8.3 mg/dL — AB (ref 2.5–4.6)
Potassium: 5.7 mmol/L — ABNORMAL HIGH (ref 3.5–5.1)
SODIUM: 137 mmol/L (ref 135–145)

## 2016-06-11 LAB — POCT I-STAT 3, VENOUS BLOOD GAS (G3P V)
ACID-BASE DEFICIT: 2 mmol/L (ref 0.0–2.0)
Bicarbonate: 24.7 mmol/L (ref 20.0–28.0)
O2 SAT: 70 %
TCO2: 26 mmol/L (ref 0–100)
pCO2, Ven: 49.8 mmHg (ref 44.0–60.0)
pH, Ven: 7.304 (ref 7.250–7.430)
pO2, Ven: 41 mmHg (ref 32.0–45.0)

## 2016-06-11 LAB — GLUCOSE, CAPILLARY
GLUCOSE-CAPILLARY: 88 mg/dL (ref 65–99)
GLUCOSE-CAPILLARY: 76 mg/dL (ref 65–99)
GLUCOSE-CAPILLARY: 82 mg/dL (ref 65–99)
Glucose-Capillary: 81 mg/dL (ref 65–99)
Glucose-Capillary: 92 mg/dL (ref 65–99)

## 2016-06-11 LAB — CBC
HCT: 33.5 % — ABNORMAL LOW (ref 39.0–52.0)
Hemoglobin: 10.3 g/dL — ABNORMAL LOW (ref 13.0–17.0)
MCH: 28.1 pg (ref 26.0–34.0)
MCHC: 30.7 g/dL (ref 30.0–36.0)
MCV: 91.3 fL (ref 78.0–100.0)
PLATELETS: 171 10*3/uL (ref 150–400)
RBC: 3.67 MIL/uL — ABNORMAL LOW (ref 4.22–5.81)
RDW: 14.6 % (ref 11.5–15.5)
WBC: 5.1 10*3/uL (ref 4.0–10.5)

## 2016-06-11 LAB — CREATININE, SERUM
Creatinine, Ser: 15.72 mg/dL — ABNORMAL HIGH (ref 0.61–1.24)
GFR calc Af Amer: 4 mL/min — ABNORMAL LOW (ref >60)
GFR, EST NON AFRICAN AMERICAN: 3 mL/min — AB (ref >60)

## 2016-06-11 LAB — POCT ACTIVATED CLOTTING TIME: Activated Clotting Time: 114 seconds

## 2016-06-11 LAB — TROPONIN I: TROPONIN I: 1.5 ng/mL — AB (ref <0.03)

## 2016-06-11 LAB — HEPARIN LEVEL (UNFRACTIONATED): HEPARIN UNFRACTIONATED: 0.29 [IU]/mL — AB (ref 0.30–0.70)

## 2016-06-11 SURGERY — RIGHT/LEFT HEART CATH AND CORONARY ANGIOGRAPHY
Anesthesia: LOCAL

## 2016-06-11 MED ORDER — FENTANYL CITRATE (PF) 100 MCG/2ML IJ SOLN
INTRAMUSCULAR | Status: DC | PRN
  Administered 2016-06-11: 25 ug via INTRAVENOUS

## 2016-06-11 MED ORDER — SODIUM CHLORIDE 0.9% FLUSH: 3.0000 mL | INTRAVENOUS | Status: DC | PRN

## 2016-06-11 MED ORDER — IOPAMIDOL (ISOVUE-370) INJECTION 76%
INTRAVENOUS | Status: DC | PRN
  Administered 2016-06-11: 70 mL via INTRA_ARTERIAL

## 2016-06-11 MED ORDER — DIPHENHYDRAMINE HCL 25 MG PO CAPS
25.0000 mg | ORAL_CAPSULE | Freq: Four times a day (QID) | ORAL | Status: DC | PRN
  Administered 2016-06-11 – 2016-06-12 (×2): 25 mg via ORAL
  Filled 2016-06-11 (×2): qty 1

## 2016-06-11 MED ORDER — SODIUM CHLORIDE 0.9 % IV SOLN
250.0000 mL | INTRAVENOUS | Status: DC | PRN
Start: 2016-06-11 — End: 2016-06-11

## 2016-06-11 MED ORDER — ENOXAPARIN SODIUM 30 MG/0.3ML ~~LOC~~ SOLN
30.0000 mg | SUBCUTANEOUS | Status: DC
  Administered 2016-06-12: 30 mg via SUBCUTANEOUS
  Filled 2016-06-11: qty 0.3

## 2016-06-11 MED ORDER — SODIUM CHLORIDE 0.9% FLUSH
3.0000 mL | Freq: Two times a day (BID) | INTRAVENOUS | Status: DC
  Administered 2016-06-11 – 2016-06-12 (×2): 3 mL via INTRAVENOUS

## 2016-06-11 MED ORDER — IOPAMIDOL (ISOVUE-370) INJECTION 76%
INTRAVENOUS | Status: AC
  Filled 2016-06-11: qty 100

## 2016-06-11 MED ORDER — MIDAZOLAM HCL 2 MG/2ML IJ SOLN
INTRAMUSCULAR | Status: AC
  Filled 2016-06-11: qty 2

## 2016-06-11 MED ORDER — LIDOCAINE HCL (PF) 1 % IJ SOLN
INTRAMUSCULAR | Status: DC | PRN
  Administered 2016-06-11: 18 mL via SUBCUTANEOUS

## 2016-06-11 MED ORDER — ACETAMINOPHEN 325 MG PO TABS: 650.0000 mg | ORAL_TABLET | ORAL | Status: DC | PRN

## 2016-06-11 MED ORDER — ONDANSETRON HCL 4 MG/2ML IJ SOLN: 4.0000 mg | Freq: Four times a day (QID) | INTRAMUSCULAR | Status: DC | PRN

## 2016-06-11 MED ORDER — HEPARIN (PORCINE) IN NACL 2-0.9 UNIT/ML-% IJ SOLN
INTRAMUSCULAR | Status: AC
  Filled 2016-06-11: qty 500

## 2016-06-11 MED ORDER — SODIUM CHLORIDE 0.9 % IV SOLN: 250.0000 mL | INTRAVENOUS | Status: DC | PRN

## 2016-06-11 MED ORDER — SODIUM CHLORIDE 0.9% FLUSH: 3.0000 mL | Freq: Two times a day (BID) | INTRAVENOUS | Status: DC

## 2016-06-11 MED ORDER — ALBUTEROL SULFATE (2.5 MG/3ML) 0.083% IN NEBU: 2.5000 mg | INHALATION_SOLUTION | RESPIRATORY_TRACT | Status: DC | PRN

## 2016-06-11 MED ORDER — MIDAZOLAM HCL 2 MG/2ML IJ SOLN
INTRAMUSCULAR | Status: DC | PRN
  Administered 2016-06-11: 1 mg via INTRAVENOUS
  Administered 2016-06-11: 2 mg via INTRAVENOUS

## 2016-06-11 MED ORDER — HEPARIN (PORCINE) IN NACL 2-0.9 UNIT/ML-% IJ SOLN
INTRAMUSCULAR | Status: DC | PRN
  Administered 2016-06-11: 1000 mL

## 2016-06-11 MED ORDER — ATORVASTATIN CALCIUM 80 MG PO TABS
80.0000 mg | ORAL_TABLET | Freq: Every day | ORAL | Status: DC
  Administered 2016-06-12: 80 mg via ORAL
  Filled 2016-06-11: qty 1

## 2016-06-11 MED ORDER — SODIUM CHLORIDE 0.9 % IV SOLN
INTRAVENOUS | Status: DC
  Administered 2016-06-11: 15:00:00 via INTRAVENOUS

## 2016-06-11 MED ORDER — CARVEDILOL 3.125 MG PO TABS
3.1250 mg | ORAL_TABLET | Freq: Two times a day (BID) | ORAL | Status: DC
  Administered 2016-06-12: 3.125 mg via ORAL
  Filled 2016-06-11: qty 1

## 2016-06-11 MED ORDER — FENTANYL CITRATE (PF) 100 MCG/2ML IJ SOLN
INTRAMUSCULAR | Status: AC
  Filled 2016-06-11: qty 2

## 2016-06-11 MED ORDER — LIDOCAINE HCL (PF) 1 % IJ SOLN
INTRAMUSCULAR | Status: AC
  Filled 2016-06-11: qty 30

## 2016-06-11 SURGICAL SUPPLY — 14 items
CATH INFINITI 5FR MULTPACK ANG (CATHETERS) ×1 IMPLANT
CATH SITESEER 5F NTR (CATHETERS) ×1 IMPLANT
CATH SWAN GANZ 7F STRAIGHT (CATHETERS) ×1 IMPLANT
GUIDEWIRE .025 260CM (WIRE) ×1 IMPLANT
KIT HEART LEFT (KITS) ×2 IMPLANT
PACK CARDIAC CATHETERIZATION (CUSTOM PROCEDURE TRAY) ×2 IMPLANT
SHEATH PINNACLE 5F 10CM (SHEATH) ×2 IMPLANT
SHEATH PINNACLE 7F 10CM (SHEATH) ×1 IMPLANT
SYR MEDRAD MARK V 150ML (SYRINGE) ×2 IMPLANT
TRANSDUCER W/STOPCOCK (MISCELLANEOUS) ×3 IMPLANT
TUBING ART PRESS 72  MALE/FEM (TUBING) ×1
TUBING ART PRESS 72 MALE/FEM (TUBING) ×1 IMPLANT
TUBING CIL FLEX 10 FLL-RA (TUBING) ×2 IMPLANT
WIRE EMERALD 3MM-J .035X150CM (WIRE) ×1 IMPLANT

## 2016-06-11 NOTE — Progress Notes (Signed)
ANTICOAGULATION CONSULT NOTE -  Pharmacy Consult for HEPARIN Indication: chest pain/ACS  Allergies  Allergen Reactions  . Vytorin [Ezetimibe-Simvastatin]     Weakness, muscle aches bad.  . Gabapentin Other (See Comments)    Twitching  . Oxycodone Nausea And Vomiting   Patient Measurements: Height: 5\' 8"  (172.7 cm) Weight: 228 lb 2.8 oz (103.5 kg) IBW/kg (Calculated) : 68.4 HEPARIN DW (KG): 90.3  Vital Signs: Temp: 97.6 F (36.4 C) (02/02 0700) Temp Source: Oral (02/02 0700) BP: 105/68 (02/02 0600) Pulse Rate: 79 (02/02 0600)  Labs:  Recent Labs  06/09/16 0740  06/09/16 1649 06/09/16 2017 06/10/16 0448 06/10/16 1545 06/11/16 1021  HGB 11.2*  --   --   --  10.8*  --   --   HCT 35.2*  --   --   --  36.1*  --   --   PLT 180  --   --   --  160  --   --   APTT  --   --   --   --   --  61*  --   LABPROT  --   --   --   --   --  16.1*  --   INR  --   --   --   --   --  1.28  --   HEPARINUNFRC  --   --   --   --   --  <0.10* 0.29*  CREATININE 12.28*  --   --   --  13.47*  --   --   TROPONINI 0.40*  < > 1.14* 1.48* 1.91*  --   --   < > = values in this interval not displayed. Estimated Creatinine Clearance: 8 mL/min (by C-G formula based on SCr of 13.47 mg/dL (H)).  Medical History: Past Medical History:  Diagnosis Date  . Anemia   . Anginal pain (HCC)   . CHF (congestive heart failure) (HCC)   . CKD (chronic kidney disease) stage 3, GFR 30-59 ml/min    on dialysis M/W/F - started March 2016  . Coronary atherosclerosis of native coronary artery    100% apical LAD and distal OM1, Rx medically - 11/13  . Depression   . Essential hypertension, benign   . Gouty arthritis   . History of blood transfusion 1988   "arm went thru glass window" (10/12/2012)  . Ischemic cardiomyopathy    LVEF 40-45%  . Mixed hyperlipidemia   . NSTEMI (non-ST elevated myocardial infarction) (HCC) 02/2012 and 10/2012  . PAD (peripheral artery disease) (HCC)    Normal ABIs, 2011; focal left  EIA stenosis; mild bilateral distal SFA disease, 2008  . RBBB   . Shortness of breath    when he has fluid he gets sob  . Type 2 diabetes mellitus (HCC)    diet controlled   Medications:  Prescriptions Prior to Admission  Medication Sig Dispense Refill Last Dose  . albuterol (PROVENTIL HFA;VENTOLIN HFA) 108 (90 BASE) MCG/ACT inhaler Inhale 2 puffs into the lungs every 6 (six) hours as needed for wheezing or shortness of breath. 1 Inhaler 2 unknown  . amLODipine (NORVASC) 10 MG tablet Take 1 tablet (10 mg total) by mouth daily. 90 tablet 3 06/08/2016 at Unknown time  . aspirin 81 MG chewable tablet Chew 81 mg by mouth daily.   06/09/2016 at Unknown time  . atorvastatin (LIPITOR) 20 MG tablet Take 20 mg by mouth daily.   06/08/2016 at Unknown time  .  carvedilol (COREG) 25 MG tablet Take 37.5 mg by mouth 2 (two) times daily with a meal.   06/08/2016 at Unknown time  . colchicine 0.6 MG tablet Take 0.6 mg by mouth 2 (two) times daily as needed (gout).    unknown  . diphenhydramine-acetaminophen (TYLENOL PM) 25-500 MG TABS Take 1-2 tablets by mouth at bedtime as needed (for pain and sleep).   unknown  . docusate sodium (COLACE) 100 MG capsule Take 3 mg by mouth 2 (two) times daily as needed for mild constipation.    06/08/2016 at Unknown time  . esomeprazole (NEXIUM) 40 MG capsule Take 40 mg by mouth daily at 12 noon.   06/08/2016 at Unknown time  . isosorbide mononitrate (IMDUR) 30 MG 24 hr tablet TAKE (1) TABLET TWICE DAILY. 60 tablet 3 06/08/2016 at Unknown time  . lidocaine-prilocaine (EMLA) cream Apply 1 application topically as needed. For port access  10 06/08/2016 at Unknown time  . multivitamin (RENA-VIT) TABS tablet Take 1 tablet by mouth daily.   Past Week at Unknown time  . nitroGLYCERIN (NITROSTAT) 0.4 MG SL tablet Place 1 tablet (0.4 mg total) under the tongue every 5 (five) minutes as needed for chest pain. 25 tablet 0 06/09/2016 at Unknown time  . sevelamer carbonate (RENVELA) 800 MG tablet  Take 2,400 mg by mouth 2 (two) times daily.  4 06/08/2016 at Unknown time  . torsemide (DEMADEX) 20 MG tablet Take 40 mg by mouth 3 (three) times daily. Fluid   06/08/2016 at Unknown time  . pantoprazole (PROTONIX) 40 MG tablet Take 1 tablet (40 mg total) by mouth 2 (two) times daily. 60 tablet 2     Assessment: 46yo male with c/o left sided CP with known hx of CAD, cath in 03/2012 severe distal disease managed medically. Most recent cardiac cath [facility] 04/2015, PAD status post right femoropopliteal bypass 07/2013, hypertension, hyperlipidemia, and ESRD on hemodialysis.Had UGI bleed 12/2015 secondary to antral erosions.    On heparin gtt for ACS. Troponins elevated up to 1.91. Last heparin level this am was borderline low at 0.29 after rate increase. No issues noted. Hgb 10.8, plts wnl. No s/s of bleed.  Goal of Therapy:  Heparin level 0.3-0.7 units/ml Monitor platelets by anticoagulation protocol: Yes   Plan:  Increase heparin gtt to 1,750 units/hr Monitor daily heparin level, CBC, s/s of bleed Check 6 hr heparin level  Enzo Bi, PharmD, [facility] Clinical Pharmacist Pager [phone] 06/11/2016 11:29 AM

## 2016-06-11 NOTE — Progress Notes (Signed)
Pharmacist Heart Failure Core Measure Documentation  Assessment: [name] has an EF documented as 25% on 06/09/2016  Rationale: Heart failure patients with left ventricular systolic dysfunction (LVSD) and an EF < 40% should be prescribed an angiotensin converting enzyme inhibitor (ACEI) or angiotensin receptor blocker (ARB) at discharge unless a contraindication is documented in the medical record.  This patient is not currently on an ACEI or ARB for HF.  This note is being placed in the record in order to provide documentation that a contraindication to the use of these agents is present for this encounter.  ACE Inhibitor or Angiotensin Receptor Blocker is contraindicated (specify all that apply)  []   ACEI allergy AND ARB allergy []   Angioedema []   Moderate or severe aortic stenosis []   Hyperkalemia []   Hypotension []   Renal artery stenosis [x]   Worsening renal function, preexisting renal disease or dysfunction  Enzo Bi, PharmD, BCPS Clinical Pharmacist Pager [phone] 06/11/2016 10:50 AM

## 2016-06-11 NOTE — Interval H&P Note (Signed)
Cath Lab Visit (complete for each Cath Lab visit)  Clinical Evaluation Leading to the Procedure:   ACS: Yes.    Non-ACS:    Anginal Classification: CCS III  Anti-ischemic medical therapy: Maximal Therapy (2 or more classes of medications)  Non-Invasive Test Results: No non-invasive testing performed  Prior CABG: No previous CABG      History and Physical Interval Note:  06/11/2016 4:48 PM  [name]  has presented today for surgery, with the diagnosis of cp, cardiomyopathy  The various methods of treatment have been discussed with the patient and family. After consideration of risks, benefits and other options for treatment, the patient has consented to  Procedure(s): Right/Left Heart Cath and Coronary Angiography (N/A) as a surgical intervention .  The patient's history has been reviewed, patient examined, no change in status, stable for surgery.  I have reviewed the patient's chart and labs.  Questions were answered to the patient's satisfaction.     Nicki Guadalajara

## 2016-06-11 NOTE — Progress Notes (Signed)
[facility] TEAM 1 - Stepdown/ICU TEAM  [name]  JXB:[id number] DOB: 1970-07-02 DOA: 06/09/2016 PCP: Louie Boston, MD    Brief Narrative:  46 y.o. male with known CAD, CKD on hemodialysis MWF, CHF, HTN and diet controlled DM who presented to the [facility] ED by EMS with complaints of ongoing chest pain.  He reported that he took 3 NTG with no relief.  He also took 3-4 baby aspirin with no relief.   He was seen by Cardiology in the [facility] ED and noted to have elevated troponin and elevated BNP.  He missed his HD the day of his presentation.  His pain improved with Dilaudid and NTG.  The patient was started on IV nitro infusion, and arrangements were made for his transfer to [facility] for cardiac eval when his troponin was noted to be increasing.     Subjective: Resting comfortably in bed at present.  Denies current CP, but states pain earlier today did respond well to narcotics.  Denies sob, f/c, abdom pain, or HA.  He is hungry.    Assessment & Plan:  Chest pain w/ known CAD Known CAD has been evaluated at [facility] w/ distal small vessel disease noted on caths (most recent Dec 2016?) - confirmed Cards to see here at [facility] - trop has trended up but peaked at 1.91 - no f/u troponin since yesterday morning - check troponin now - cont IV heparin, ASA, BB, topical nitro   Severe combined systolic and diastolic CHF EF markedly decreased at 25% on TTE 1/31 w/ grade 2 DD - modest volume overload on exam - EF was 55% on TTE July 2015, w/ grade 2 DD - volume control will be w/ HD   ESRD on HD Nephrology has been alerted as of 11:50AM today but was not previously informed of the pt's transfer to [facility] - stat renal fxn panel to assure lytes still in safe range  Hyperlipidemia Cont statin   DM2 CBG well controlled - on no meds as outpt   HTN BP well controlled at present   PVD s/p R femoropopliteal bypass 07/2013 - currently asymptomatic   DVT prophylaxis: IV heparin  Code Status: FULL  CODE Family Communication: no family present at time of exam  Disposition Plan:   Consultants:  [facility] Cardiology Nephrology   Procedures: 1/31 TTE - EF25% - diffuse hypokinesis - grade 2 DD  Antimicrobials:  none  Objective: Blood pressure 105/68, pulse 79, temperature 97.6 F (36.4 C), temperature source Oral, resp. rate 16, height 5\' 8"  (1.727 m), weight 103.5 kg (228 lb 2.8 oz), SpO2 (!) 87 %.  Intake/Output Summary (Last 24 hours) at 06/11/16 1056 Last data filed at 06/11/16 0600  Gross per 24 hour  Intake           742.14 ml  Output                0 ml  Net           742.14 ml   Filed Weights   06/09/16 0646 06/09/16 1407 06/10/16 0500  Weight: 99.8 kg (220 lb) 101.5 kg (223 lb 12.3 oz) 103.5 kg (228 lb 2.8 oz)    Examination: General: No acute respiratory distress Lungs: Clear to auscultation bilaterally without wheezes or crackles Cardiovascular: Regular rate and rhythm with 2/6 systolic M - no rub or gallup  Abdomen: Nontender, nondistended, soft, bowel sounds positive, no rebound, no ascites, no appreciable mass Extremities: No significant cyanosis or clubbing -  1+ edema bilateral lower extremities  CBC:  Recent Labs Lab 06/09/16 0740 06/10/16 0448  WBC 5.4 4.7  NEUTROABS 3.9  --   HGB 11.2* 10.8*  HCT 35.2* 36.1*  MCV 91.9 93.5  PLT 180 160   Basic Metabolic Panel:  Recent Labs Lab 06/09/16 0740 06/10/16 0448  NA 137 135  K 4.8 4.7  CL 98* 98*  CO2 26 22  GLUCOSE 117* 81  BUN 62* 69*  CREATININE 12.28* 13.47*  CALCIUM 8.8* 8.6*  PHOS  --  8.1*   GFR: Estimated Creatinine Clearance: 8 mL/min (by C-G formula based on SCr of 13.47 mg/dL (H)).  Liver Function Tests:  Recent Labs Lab 06/09/16 0740 06/10/16 0448  AST 9*  --   ALT 9*  --   ALKPHOS 42  --   BILITOT 0.8  --   PROT 6.9  --   ALBUMIN 3.5 3.3*    Coagulation Profile:  Recent Labs Lab 06/10/16 1545  INR 1.28    Cardiac Enzymes:  Recent Labs Lab 06/09/16 0740  06/09/16 1419 06/09/16 1649 06/09/16 2017 06/10/16 0448  TROPONINI 0.40* 0.80* 1.14* 1.48* 1.91*    HbA1C: Hgb A1c MFr Bld  Date/Time Value Ref Range Status  07/18/2013 05:00 AM 8.3 (H) <5.7 % Final    Comment:    (NOTE)                                                                       According to the ADA Clinical Practice Recommendations for 2011, when HbA1c is used as a screening test:  >=6.5%   Diagnostic of Diabetes Mellitus           (if abnormal result is confirmed) 5.7-6.4%   Increased risk of developing Diabetes Mellitus References:Diagnosis and Classification of Diabetes Mellitus,Diabetes Care,2011,34(Suppl 1):S62-S69 and Standards of Medical Care in         Diabetes - 2011,Diabetes Care,2011,34 (Suppl 1):S11-S61.  03/28/2012 01:13 PM 7.9 (H) <5.7 % Final    Comment:    (NOTE)                                                                       According to the ADA Clinical Practice Recommendations for 2011, when HbA1c is used as a screening test:  >=6.5%   Diagnostic of Diabetes Mellitus           (if abnormal result is confirmed) 5.7-6.4%   Increased risk of developing Diabetes Mellitus References:Diagnosis and Classification of Diabetes Mellitus,Diabetes Care,2011,34(Suppl 1):S62-S69 and Standards of Medical Care in         Diabetes - 2011,Diabetes Care,2011,34 (Suppl 1):S11-S61.    CBG:  Recent Labs Lab 06/10/16 1133 06/10/16 1606 06/10/16 2104 06/11/16 0752 06/11/16 1046  GLUCAP 67 89 115* 88 76    Recent Results (from the past 240 hour(s))  MRSA PCR Screening     Status: None   Collection Time: 06/09/16  3:01 PM  Result Value Ref Range Status  MRSA by PCR NEGATIVE NEGATIVE Final    Comment:        The GeneXpert MRSA Assay (FDA approved for NASAL specimens only), is one component of a comprehensive MRSA colonization surveillance program. It is not intended to diagnose MRSA infection nor to guide or monitor treatment for MRSA  infections.      Scheduled Meds: . amLODipine  10 mg Oral Daily  . aspirin  81 mg Oral Daily  . atorvastatin  20 mg Oral q1800  . carvedilol  37.5 mg Oral BID WC  . multivitamin  1 tablet Oral Daily  . nitroGLYCERIN  1 inch Topical Q6H  . pantoprazole  40 mg Oral BID  . sevelamer carbonate  2,400 mg Oral BID WC  . torsemide  40 mg Oral TID   Continuous Infusions: . heparin 1,600 Units/hr (06/11/16 0222)  . nitroGLYCERIN Stopped (06/09/16 2100)     LOS: 1 day   Lonia Blood, MD [facility] Office  [phone] Pager - Text Page per Amion as per below:  On-Call/Text Page:      [email or web address]      password [email or web address]  If 7PM-7AM, please contact night-coverage [email or web address] Password [facility] 06/11/2016, 10:56 AM

## 2016-06-11 NOTE — Plan of Care (Signed)
Problem: Safety: Goal: Ability to remain free from injury will improve Outcome: Progressing Patient steady on his feet. Room kept uncluttered and patient calls for assistance when he needs to get up.   Problem: Pain Managment: Goal: General experience of comfort will improve Outcome: Progressing Patient able to rest overnight with morphine every 2 hours. Complaints of chest pain continue, but patient scheduled for heart cath 2/2.   Problem: Tissue Perfusion: Goal: Risk factors for ineffective tissue perfusion will decrease Outcome: Progressing Patient on heparin gtt at this time.  Problem: Activity: Goal: Risk for activity intolerance will decrease Outcome: Progressing Patient able to ambulate to bathroom with no problems.

## 2016-06-11 NOTE — Progress Notes (Signed)
CRITICAL VALUE ALERT  Critical value received:  Troponin (1.50)  Date of notification:  06/11/2016  Time of notification:  1348  Critical value read back:Yes.    Nurse who received alert:  Lovie Macadamia RN  MD notified (1st page):  Dr Sharon Seller   Time of first page:  1358 MD on unit  MD notified (2nd page):  Time of second page:  Responding MD:  Dr Sharon Seller  Time MD responded:  [phone]

## 2016-06-11 NOTE — Progress Notes (Signed)
Transferred -in from the cath lab by bed awake and alert. Right groin dressing dry and intact. Instructed to avoid bending right knee. Bed rest emphasized.

## 2016-06-11 NOTE — H&P (View-Only) (Signed)
DAILY PROGRESS NOTE  Subjective:  Transferred from [facility] yesterday for [place]. Recent chest pain with NSTEMI - echo on 06/09/16 shows newly reduced LVEF to 25%. He has not had dialysis since Monday. Plan for that later today after cath. On IV heparin and nitroglycerin. He reports prior right FEM-POP - desires a right radial cath. I mentioned to him that if he were to have a radial complication, that may keep him from having a fistula in the right arm if he needs it someday (if he were to thrombose the left fistula or if it became infected), but he was insistent on a right radial approach to cath.  Objective:  Temp:  [97.4 F (36.3 C)-98.8 F (37.1 C)] 97.6 F (36.4 C) (02/02 0700) Pulse Rate:  [78-79] 79 (02/02 0600) Resp:  [15-21] 16 (02/02 0700) BP: (38-131)/(28-95) 105/68 (02/02 0600) SpO2:  [87 %-95 %] 87 % (02/02 0600) Weight change:   Intake/Output from previous day: 02/01 0701 - 02/02 0700 In: 742.1 [P.O.:480; I.V.:262.1] Out: -   Intake/Output from this shift: No intake/output data recorded.  Medications: No current facility-administered medications on file prior to encounter.    Current Outpatient Prescriptions on File Prior to Encounter  Medication Sig Dispense Refill  . albuterol (PROVENTIL HFA;VENTOLIN HFA) 108 (90 BASE) MCG/ACT inhaler Inhale 2 puffs into the lungs every 6 (six) hours as needed for wheezing or shortness of breath. 1 Inhaler 2  . amLODipine (NORVASC) 10 MG tablet Take 1 tablet (10 mg total) by mouth daily. 90 tablet 3  . carvedilol (COREG) 25 MG tablet Take 37.5 mg by mouth 2 (two) times daily with a meal.    . colchicine 0.6 MG tablet Take 0.6 mg by mouth 2 (two) times daily as needed (gout).     Marland Kitchen diphenhydramine-acetaminophen (TYLENOL PM) 25-500 MG TABS Take 1-2 tablets by mouth at bedtime as needed (for pain and sleep).    . docusate sodium (COLACE) 100 MG capsule Take 3 mg by mouth 2 (two) times daily as needed for mild constipation.     .  isosorbide mononitrate (IMDUR) 30 MG 24 hr tablet TAKE (1) TABLET TWICE DAILY. 60 tablet 3  . lidocaine-prilocaine (EMLA) cream Apply 1 application topically as needed. For port access  10  . multivitamin (RENA-VIT) TABS tablet Take 1 tablet by mouth daily.    . nitroGLYCERIN (NITROSTAT) 0.4 MG SL tablet Place 1 tablet (0.4 mg total) under the tongue every 5 (five) minutes as needed for chest pain. 25 tablet 0  . pantoprazole (PROTONIX) 40 MG tablet Take 1 tablet (40 mg total) by mouth 2 (two) times daily. 60 tablet 2    Physical Exam: General appearance: alert and no distress Neck: JVD - 3 cm above sternal notch and no carotid bruit Lungs: diminished breath sounds bilaterally Heart: regular rate and rhythm Abdomen: soft, non-tender; bowel sounds normal; no masses,  no organomegaly Extremities: extremities normal, atraumatic, no cyanosis or edema and no rash Pulses: 1+ Skin: Skin color, texture, turgor normal. No rashes or lesions Neurologic: Grossly normal PSych: Pleasant  Lab Results: Results for orders placed or performed during the hospital encounter of 06/09/16 (from the past 48 hour(s))  Troponin I-serum (0, 3, 6 hours)     Status: Abnormal   Collection Time: 06/09/16  2:19 PM  Result Value Ref Range   Troponin I 0.80 (HH) <0.03 ng/mL    Comment: CRITICAL VALUE NOTED.  VALUE IS CONSISTENT WITH PREVIOUSLY REPORTED AND CALLED VALUE.  MRSA PCR Screening     Status: None   Collection Time: 06/09/16  3:01 PM  Result Value Ref Range   MRSA by PCR NEGATIVE NEGATIVE    Comment:        The GeneXpert MRSA Assay (FDA approved for NASAL specimens only), is one component of a comprehensive MRSA colonization surveillance program. It is not intended to diagnose MRSA infection nor to guide or monitor treatment for MRSA infections.   Troponin I-serum (0, 3, 6 hours)     Status: Abnormal   Collection Time: 06/09/16  4:49 PM  Result Value Ref Range   Troponin I 1.14 (HH) <0.03 ng/mL     Comment: CRITICAL VALUE NOTED.  VALUE IS CONSISTENT WITH PREVIOUSLY REPORTED AND CALLED VALUE.  Glucose, capillary     Status: None   Collection Time: 06/09/16  5:13 PM  Result Value Ref Range   Glucose-Capillary 93 65 - 99 mg/dL   Comment 1 Notify RN    Comment 2 Document in Chart   Troponin I-serum (0, 3, 6 hours)     Status: Abnormal   Collection Time: 06/09/16  8:17 PM  Result Value Ref Range   Troponin I 1.48 (HH) <0.03 ng/mL    Comment: CRITICAL VALUE NOTED.  VALUE IS CONSISTENT WITH PREVIOUSLY REPORTED AND CALLED VALUE.  Glucose, capillary     Status: None   Collection Time: 06/09/16 10:15 PM  Result Value Ref Range   Glucose-Capillary 87 65 - 99 mg/dL  CBC     Status: Abnormal   Collection Time: 06/10/16  4:48 AM  Result Value Ref Range   WBC 4.7 4.0 - 10.5 K/uL   RBC 3.86 (L) 4.22 - 5.81 MIL/uL   Hemoglobin 10.8 (L) 13.0 - 17.0 g/dL   HCT 36.1 (L) 39.0 - 52.0 %   MCV 93.5 78.0 - 100.0 fL   MCH 28.0 26.0 - 34.0 pg   MCHC 29.9 (L) 30.0 - 36.0 g/dL   RDW 14.2 11.5 - 15.5 %   Platelets 160 150 - 400 K/uL  Troponin I     Status: Abnormal   Collection Time: 06/10/16  4:48 AM  Result Value Ref Range   Troponin I 1.91 (HH) <0.03 ng/mL    Comment: CRITICAL RESULT CALLED TO, READ BACK BY AND VERIFIED WITH: HYLTON,L AT 11:00AM ON 06/10/16 BY [facility]   Renal function panel     Status: Abnormal   Collection Time: 06/10/16  4:48 AM  Result Value Ref Range   Sodium 135 135 - 145 mmol/L   Potassium 4.7 3.5 - 5.1 mmol/L   Chloride 98 (L) 101 - 111 mmol/L   CO2 22 22 - 32 mmol/L   Glucose, Bld 81 65 - 99 mg/dL   BUN 69 (H) 6 - 20 mg/dL   Creatinine, Ser 13.47 (H) 0.61 - 1.24 mg/dL   Calcium 8.6 (L) 8.9 - 10.3 mg/dL   Phosphorus 8.1 (H) 2.5 - 4.6 mg/dL   Albumin 3.3 (L) 3.5 - 5.0 g/dL   GFR calc non Af Amer 4 (L) >60 mL/min   GFR calc Af Amer 4 (L) >60 mL/min    Comment: (NOTE) The eGFR has been calculated using the CKD EPI equation. This calculation has not been  validated in all clinical situations. eGFR's persistently <60 mL/min signify possible Chronic Kidney Disease.    Anion gap 15 5 - 15  Lipid panel     Status: Abnormal   Collection Time: 06/10/16  4:48 AM  Result Value  Ref Range   Cholesterol 84 0 - 200 mg/dL   Triglycerides 78 <150 mg/dL   HDL 28 (L) >40 mg/dL   Total CHOL/HDL Ratio 3.0 RATIO   VLDL 16 0 - 40 mg/dL   LDL Cholesterol 40 0 - 99 mg/dL    Comment:        Total Cholesterol/HDL:CHD Risk Coronary Heart Disease Risk Table                     Men   Women  1/2 Average Risk   3.4   3.3  Average Risk       5.0   4.4  2 X Average Risk   9.6   7.1  3 X Average Risk  23.4   11.0        Use the calculated Patient Ratio above and the CHD Risk Table to determine the patient's CHD Risk.        ATP III CLASSIFICATION (LDL):  <100     mg/dL   Optimal  100-129  mg/dL   Near or Above                    Optimal  130-159  mg/dL   Borderline  160-189  mg/dL   High  >190     mg/dL   Very High   Glucose, capillary     Status: None   Collection Time: 06/10/16 11:33 AM  Result Value Ref Range   Glucose-Capillary 67 65 - 99 mg/dL   Comment 1 Notify RN    Comment 2 Document in Chart   Heparin level (unfractionated)     Status: Abnormal   Collection Time: 06/10/16  3:45 PM  Result Value Ref Range   Heparin Unfractionated <0.10 (L) 0.30 - 0.70 IU/mL    Comment:        IF HEPARIN RESULTS ARE BELOW EXPECTED VALUES, AND PATIENT DOSAGE HAS BEEN CONFIRMED, SUGGEST FOLLOW UP TESTING OF ANTITHROMBIN III LEVELS.   APTT     Status: Abnormal   Collection Time: 06/10/16  3:45 PM  Result Value Ref Range   aPTT 61 (H) 24 - 36 seconds    Comment:        IF BASELINE aPTT IS ELEVATED, SUGGEST PATIENT RISK ASSESSMENT BE USED TO DETERMINE APPROPRIATE ANTICOAGULANT THERAPY.   Protime-INR     Status: Abnormal   Collection Time: 06/10/16  3:45 PM  Result Value Ref Range   Prothrombin Time 16.1 (H) 11.4 - 15.2 seconds   INR 1.28     Glucose, capillary     Status: None   Collection Time: 06/10/16  4:06 PM  Result Value Ref Range   Glucose-Capillary 89 65 - 99 mg/dL   Comment 1 Notify RN    Comment 2 Document in Chart   Glucose, capillary     Status: Abnormal   Collection Time: 06/10/16  9:04 PM  Result Value Ref Range   Glucose-Capillary 115 (H) 65 - 99 mg/dL  Glucose, capillary     Status: None   Collection Time: 06/11/16  7:52 AM  Result Value Ref Range   Glucose-Capillary 88 65 - 99 mg/dL  Heparin level (unfractionated)     Status: Abnormal   Collection Time: 06/11/16 10:21 AM  Result Value Ref Range   Heparin Unfractionated 0.29 (L) 0.30 - 0.70 IU/mL    Comment:        IF HEPARIN RESULTS ARE BELOW EXPECTED VALUES, AND PATIENT DOSAGE HAS  BEEN CONFIRMED, SUGGEST FOLLOW UP TESTING OF ANTITHROMBIN III LEVELS.   Glucose, capillary     Status: None   Collection Time: 06/11/16 10:46 AM  Result Value Ref Range   Glucose-Capillary 76 65 - 99 mg/dL    Imaging: No results found.  Assessment:  1. Principal Problem: 2.   Chest pain 3. Active Problems: 4.   Hyperlipidemia 5.   Type 2 diabetes mellitus with diabetic chronic kidney disease ([place]) 6.   PAD (peripheral artery disease) ([place]) 7.   Tobacco abuse 8.   Essential hypertension, benign 9.   Coronary atherosclerosis of native coronary artery 10.   Ischemic cardiomyopathy 11.   PVD (peripheral vascular disease) ([place]) 12.   CKD (chronic kidney disease), stage IV ([place]) 13.   End stage renal disease ([place]) 14.   Elevated troponin I level 15.   Chest pain with high risk for cardiac etiology 16.   Plan:  1. Plan for [facility] today for NSTEMI and LVEF 25%. He will likely need dialysis later today - per Dr. Thereasa Solo, nephrology is on board. Discussed the risks, benefits and alternatives to cath, including the increased risk to the right radial artery if we were to proceed as he requested with that approach. He seemed agreeable to cath. This has been scheduled  with Dr. Claiborne Billings at 2 pm today.  Time Spent Directly with Patient:  15 minutes  Length of Stay:  LOS: 1 day   Pixie Casino, MD, [facility] Attending Cardiologist [place] 06/11/2016, 12:27 PM

## 2016-06-11 NOTE — Progress Notes (Signed)
DAILY PROGRESS NOTE  Subjective:  Transferred from [facility] yesterday for [place]. Recent chest pain with NSTEMI - echo on 06/09/16 shows newly reduced LVEF to 25%. He has not had dialysis since Monday. Plan for that later today after cath. On IV heparin and nitroglycerin. He reports prior right FEM-POP - desires a right radial cath. I mentioned to him that if he were to have a radial complication, that may keep him from having a fistula in the right arm if he needs it someday (if he were to thrombose the left fistula or if it became infected), but he was insistent on a right radial approach to cath.  Objective:  Temp:  [97.4 F (36.3 C)-98.8 F (37.1 C)] 97.6 F (36.4 C) (02/02 0700) Pulse Rate:  [78-79] 79 (02/02 0600) Resp:  [15-21] 16 (02/02 0700) BP: (38-131)/(28-95) 105/68 (02/02 0600) SpO2:  [87 %-95 %] 87 % (02/02 0600) Weight change:   Intake/Output from previous day: 02/01 0701 - 02/02 0700 In: 742.1 [P.O.:480; I.V.:262.1] Out: -   Intake/Output from this shift: No intake/output data recorded.  Medications: No current facility-administered medications on file prior to encounter.    Current Outpatient Prescriptions on File Prior to Encounter  Medication Sig Dispense Refill  . albuterol (PROVENTIL HFA;VENTOLIN HFA) 108 (90 BASE) MCG/ACT inhaler Inhale 2 puffs into the lungs every 6 (six) hours as needed for wheezing or shortness of breath. 1 Inhaler 2  . amLODipine (NORVASC) 10 MG tablet Take 1 tablet (10 mg total) by mouth daily. 90 tablet 3  . carvedilol (COREG) 25 MG tablet Take 37.5 mg by mouth 2 (two) times daily with a meal.    . colchicine 0.6 MG tablet Take 0.6 mg by mouth 2 (two) times daily as needed (gout).     Marland Kitchen diphenhydramine-acetaminophen (TYLENOL PM) 25-500 MG TABS Take 1-2 tablets by mouth at bedtime as needed (for pain and sleep).    . docusate sodium (COLACE) 100 MG capsule Take 3 mg by mouth 2 (two) times daily as needed for mild constipation.     .  isosorbide mononitrate (IMDUR) 30 MG 24 hr tablet TAKE (1) TABLET TWICE DAILY. 60 tablet 3  . lidocaine-prilocaine (EMLA) cream Apply 1 application topically as needed. For port access  10  . multivitamin (RENA-VIT) TABS tablet Take 1 tablet by mouth daily.    . nitroGLYCERIN (NITROSTAT) 0.4 MG SL tablet Place 1 tablet (0.4 mg total) under the tongue every 5 (five) minutes as needed for chest pain. 25 tablet 0  . pantoprazole (PROTONIX) 40 MG tablet Take 1 tablet (40 mg total) by mouth 2 (two) times daily. 60 tablet 2    Physical Exam: General appearance: alert and no distress Neck: JVD - 3 cm above sternal notch and no carotid bruit Lungs: diminished breath sounds bilaterally Heart: regular rate and rhythm Abdomen: soft, non-tender; bowel sounds normal; no masses,  no organomegaly Extremities: extremities normal, atraumatic, no cyanosis or edema and no rash Pulses: 1+ Skin: Skin color, texture, turgor normal. No rashes or lesions Neurologic: Grossly normal PSych: Pleasant  Lab Results: Results for orders placed or performed during the hospital encounter of 06/09/16 (from the past 48 hour(s))  Troponin I-serum (0, 3, 6 hours)     Status: Abnormal   Collection Time: 06/09/16  2:19 PM  Result Value Ref Range   Troponin I 0.80 (HH) <0.03 ng/mL    Comment: CRITICAL VALUE NOTED.  VALUE IS CONSISTENT WITH PREVIOUSLY REPORTED AND CALLED VALUE.  MRSA PCR Screening     Status: None   Collection Time: 06/09/16  3:01 PM  Result Value Ref Range   MRSA by PCR NEGATIVE NEGATIVE    Comment:        The GeneXpert MRSA Assay (FDA approved for NASAL specimens only), is one component of a comprehensive MRSA colonization surveillance program. It is not intended to diagnose MRSA infection nor to guide or monitor treatment for MRSA infections.   Troponin I-serum (0, 3, 6 hours)     Status: Abnormal   Collection Time: 06/09/16  4:49 PM  Result Value Ref Range   Troponin I 1.14 (HH) <0.03 ng/mL     Comment: CRITICAL VALUE NOTED.  VALUE IS CONSISTENT WITH PREVIOUSLY REPORTED AND CALLED VALUE.  Glucose, capillary     Status: None   Collection Time: 06/09/16  5:13 PM  Result Value Ref Range   Glucose-Capillary 93 65 - 99 mg/dL   Comment 1 Notify RN    Comment 2 Document in Chart   Troponin I-serum (0, 3, 6 hours)     Status: Abnormal   Collection Time: 06/09/16  8:17 PM  Result Value Ref Range   Troponin I 1.48 (HH) <0.03 ng/mL    Comment: CRITICAL VALUE NOTED.  VALUE IS CONSISTENT WITH PREVIOUSLY REPORTED AND CALLED VALUE.  Glucose, capillary     Status: None   Collection Time: 06/09/16 10:15 PM  Result Value Ref Range   Glucose-Capillary 87 65 - 99 mg/dL  CBC     Status: Abnormal   Collection Time: 06/10/16  4:48 AM  Result Value Ref Range   WBC 4.7 4.0 - 10.5 K/uL   RBC 3.86 (L) 4.22 - 5.81 MIL/uL   Hemoglobin 10.8 (L) 13.0 - 17.0 g/dL   HCT 36.1 (L) 39.0 - 52.0 %   MCV 93.5 78.0 - 100.0 fL   MCH 28.0 26.0 - 34.0 pg   MCHC 29.9 (L) 30.0 - 36.0 g/dL   RDW 14.2 11.5 - 15.5 %   Platelets 160 150 - 400 K/uL  Troponin I     Status: Abnormal   Collection Time: 06/10/16  4:48 AM  Result Value Ref Range   Troponin I 1.91 (HH) <0.03 ng/mL    Comment: CRITICAL RESULT CALLED TO, READ BACK BY AND VERIFIED WITH: HYLTON,L AT 11:00AM ON 06/10/16 BY [facility]   Renal function panel     Status: Abnormal   Collection Time: 06/10/16  4:48 AM  Result Value Ref Range   Sodium 135 135 - 145 mmol/L   Potassium 4.7 3.5 - 5.1 mmol/L   Chloride 98 (L) 101 - 111 mmol/L   CO2 22 22 - 32 mmol/L   Glucose, Bld 81 65 - 99 mg/dL   BUN 69 (H) 6 - 20 mg/dL   Creatinine, Ser 13.47 (H) 0.61 - 1.24 mg/dL   Calcium 8.6 (L) 8.9 - 10.3 mg/dL   Phosphorus 8.1 (H) 2.5 - 4.6 mg/dL   Albumin 3.3 (L) 3.5 - 5.0 g/dL   GFR calc non Af Amer 4 (L) >60 mL/min   GFR calc Af Amer 4 (L) >60 mL/min    Comment: (NOTE) The eGFR has been calculated using the CKD EPI equation. This calculation has not been  validated in all clinical situations. eGFR's persistently <60 mL/min signify possible Chronic Kidney Disease.    Anion gap 15 5 - 15  Lipid panel     Status: Abnormal   Collection Time: 06/10/16  4:48 AM  Result Value  Ref Range   Cholesterol 84 0 - 200 mg/dL   Triglycerides 78 <150 mg/dL   HDL 28 (L) >40 mg/dL   Total CHOL/HDL Ratio 3.0 RATIO   VLDL 16 0 - 40 mg/dL   LDL Cholesterol 40 0 - 99 mg/dL    Comment:        Total Cholesterol/HDL:CHD Risk Coronary Heart Disease Risk Table                     Men   Women  1/2 Average Risk   3.4   3.3  Average Risk       5.0   4.4  2 X Average Risk   9.6   7.1  3 X Average Risk  23.4   11.0        Use the calculated Patient Ratio above and the CHD Risk Table to determine the patient's CHD Risk.        ATP III CLASSIFICATION (LDL):  <100     mg/dL   Optimal  100-129  mg/dL   Near or Above                    Optimal  130-159  mg/dL   Borderline  160-189  mg/dL   High  >190     mg/dL   Very High   Glucose, capillary     Status: None   Collection Time: 06/10/16 11:33 AM  Result Value Ref Range   Glucose-Capillary 67 65 - 99 mg/dL   Comment 1 Notify RN    Comment 2 Document in Chart   Heparin level (unfractionated)     Status: Abnormal   Collection Time: 06/10/16  3:45 PM  Result Value Ref Range   Heparin Unfractionated <0.10 (L) 0.30 - 0.70 IU/mL    Comment:        IF HEPARIN RESULTS ARE BELOW EXPECTED VALUES, AND PATIENT DOSAGE HAS BEEN CONFIRMED, SUGGEST FOLLOW UP TESTING OF ANTITHROMBIN III LEVELS.   APTT     Status: Abnormal   Collection Time: 06/10/16  3:45 PM  Result Value Ref Range   aPTT 61 (H) 24 - 36 seconds    Comment:        IF BASELINE aPTT IS ELEVATED, SUGGEST PATIENT RISK ASSESSMENT BE USED TO DETERMINE APPROPRIATE ANTICOAGULANT THERAPY.   Protime-INR     Status: Abnormal   Collection Time: 06/10/16  3:45 PM  Result Value Ref Range   Prothrombin Time 16.1 (H) 11.4 - 15.2 seconds   INR 1.28     Glucose, capillary     Status: None   Collection Time: 06/10/16  4:06 PM  Result Value Ref Range   Glucose-Capillary 89 65 - 99 mg/dL   Comment 1 Notify RN    Comment 2 Document in Chart   Glucose, capillary     Status: Abnormal   Collection Time: 06/10/16  9:04 PM  Result Value Ref Range   Glucose-Capillary 115 (H) 65 - 99 mg/dL  Glucose, capillary     Status: None   Collection Time: 06/11/16  7:52 AM  Result Value Ref Range   Glucose-Capillary 88 65 - 99 mg/dL  Heparin level (unfractionated)     Status: Abnormal   Collection Time: 06/11/16 10:21 AM  Result Value Ref Range   Heparin Unfractionated 0.29 (L) 0.30 - 0.70 IU/mL    Comment:        IF HEPARIN RESULTS ARE BELOW EXPECTED VALUES, AND PATIENT DOSAGE HAS  BEEN CONFIRMED, SUGGEST FOLLOW UP TESTING OF ANTITHROMBIN III LEVELS.   Glucose, capillary     Status: None   Collection Time: 06/11/16 10:46 AM  Result Value Ref Range   Glucose-Capillary 76 65 - 99 mg/dL    Imaging: No results found.  Assessment:  1. Principal Problem: 2.   Chest pain 3. Active Problems: 4.   Hyperlipidemia 5.   Type 2 diabetes mellitus with diabetic chronic kidney disease ([place]) 6.   PAD (peripheral artery disease) ([place]) 7.   Tobacco abuse 8.   Essential hypertension, benign 9.   Coronary atherosclerosis of native coronary artery 10.   Ischemic cardiomyopathy 11.   PVD (peripheral vascular disease) ([place]) 12.   CKD (chronic kidney disease), stage IV ([place]) 13.   End stage renal disease ([place]) 14.   Elevated troponin I level 15.   Chest pain with high risk for cardiac etiology 16.   Plan:  1. Plan for [facility] today for NSTEMI and LVEF 25%. He will likely need dialysis later today - per Dr. Thereasa Solo, nephrology is on board. Discussed the risks, benefits and alternatives to cath, including the increased risk to the right radial artery if we were to proceed as he requested with that approach. He seemed agreeable to cath. This has been scheduled  with Dr. Claiborne Billings at 2 pm today.  Time Spent Directly with Patient:  15 minutes  Length of Stay:  LOS: 1 day   Pixie Casino, MD, [facility] Attending Cardiologist [place] 06/11/2016, 12:27 PM

## 2016-06-11 NOTE — Consult Note (Signed)
[facility] Renal Consultation Note    Indication for Consultation:  Management of ESRD/hemodialysis, anemia, hypertension/volume, and secondary hyperparathyroidism. PCP:  HPI: [name] is a 46 y.o. male with ESRD, HTN, CAD, Type 2 DM, and Hx ischemic cardiomyopathy who was admitted with NSTEMI.  Pt had presented to [facility] ED via EMS on 1/31 with chest pain which did not improve with aspirin or NTG. Pain radiated to L arm. Denied N/V or dyspnea. He has known CAD. In ED, found to have + positive cardiac enzymes and high BNP. CXR negative. EKG without acute changes. He underwent an echo which showed diffuse hypokinesis with EF down to ~25%. Then transferred to [facility] [place] for cardiac cath. Will be going for catheterization today (2/2).  From renal standpoint, usually dialyzes MWThF at [facility] in [place] via LUE AVF, last HD 06/07/16. Missed HD on 1/31 due to above issues. He reports severe cramping with HD, says always signs off after 2-1/2 hours. + orthopnea today. Still having CP, rated 6/10. Denies N/V/D/fever/chills.  Past Medical History:  Diagnosis Date  . Anemia   . Anginal pain (HCC)   . CHF (congestive heart failure) (HCC)   . CKD (chronic kidney disease) stage 3, GFR 30-59 ml/min    on dialysis M/W/F - started March 2016  . Coronary atherosclerosis of native coronary artery    100% apical LAD and distal OM1, Rx medically - 11/13  . Depression   . Essential hypertension, benign   . Gouty arthritis   . History of blood transfusion 1988   "arm went thru glass window" (10/12/2012)  . Ischemic cardiomyopathy    LVEF 40-45%  . Mixed hyperlipidemia   . NSTEMI (non-ST elevated myocardial infarction) (HCC) 02/2012 and 10/2012  . PAD (peripheral artery disease) (HCC)    Normal ABIs, 2011; focal left EIA stenosis; mild bilateral distal SFA disease, 2008  . RBBB   . Shortness of breath    when he has fluid he gets sob  . Type 2 diabetes mellitus (HCC)    diet controlled    Past Surgical History:  Procedure Laterality Date  . Arm surgery Right [phone]   "@ least 3 ORs; took nerves out of my left leg and put them in my arm" (10/12/2012)  . AV FISTULA PLACEMENT Left 10/16/2013   Procedure: CREATION OF LEFT ARM BASILIC TO BRACHIAL FISTULA;  Surgeon: Sherren Kerns, MD;  Location: [facility] OR;  Service: Vascular;  Laterality: Left;  . BASCILIC VEIN TRANSPOSITION Left 08/13/2014   Procedure: Left arm SECOND STAGE BASILIC VEIN TRANSPOSITION;  Surgeon: Sherren Kerns, MD;  Location[facility] OR;  Service: Vascular;  Laterality: Left;  . CARDIAC CATHETERIZATION  03/28/12   20% prox and mid LAD, 100% distal LAD, 30% prox OM, 100% distal OM, 40% prox RCA, 40% mid RCA, 25% distal RCA, small PDA w/ 80% diffuse dz, PLB small w/ 50% diffuse stenoses. No focal lesions for PCI. Recommendations for continued medical management and aggressive RF reduction  . COLONOSCOPY  2015   Meta Hatchet: incomplete colonoscopy due to inadequate prep, inflammation found sigmoid colon to splenic flexure/distal transverse colon. Ischemic colitis vs infectious  . ENDARTERECTOMY FEMORAL Right 07/17/2013   Procedure: ENDARTERECTOMY FEMORAL WITH PROFUNDAPLOASTY;  Surgeon: Sherren Kerns, MD;  Location: [facility] OR;  Service: Vascular;  Laterality: Right;  . ESOPHAGOGASTRODUODENOSCOPY (EGD) WITH PROPOFOL N/A 01/02/2016   Procedure: ESOPHAGOGASTRODUODENOSCOPY (EGD) WITH PROPOFOL;  Surgeon: Corbin Ade, MD;  Location[facility] ENDO SUITE;  Service: Endoscopy;  Laterality: N/A;  . EYE SURGERY Left 1990's   "blood vessel ruptured" (10/12/2012)  . FEMORAL-POPLITEAL BYPASS GRAFT Right 07/17/2013   Procedure: BYPASS GRAFT FEMORAL-POPLITEAL ARTERY-RIGHT;  Surgeon: Sherren Kerns, MD;  Location: [facility] OR;  Service: Vascular;  Laterality: Right;  . LEFT HEART CATHETERIZATION WITH CORONARY ANGIOGRAM N/A 03/28/2012   Procedure: LEFT HEART CATHETERIZATION WITH CORONARY ANGIOGRAM;  Surgeon: Kathleene Hazel, MD;  Location: [facility]  CATH LAB;  Service: Cardiovascular;  Laterality: N/A;  . LOWER EXTREMITY ANGIOGRAM Right 07/11/2013   Procedure: LOWER EXTREMITY ANGIOGRAM;  Surgeon: Fransisco Hertz, MD;  Location: [facility] CATH LAB;  Service: Cardiovascular;  Laterality: Right;  rt leg angio  . PATCH ANGIOPLASTY Right 07/17/2013   Procedure: PATCH ANGIOPLASTY OF COMMON FEMORAL AND PROFUNDA USING SEGMENT OF GREATER SAPHENOUS VEIN ;  Surgeon: Sherren Kerns, MD;  Location: [facility] OR;  Service: Vascular;  Laterality: Right;  . PERIPHERAL VASCULAR CATHETERIZATION N/A 02/28/2015   Procedure: Fistulagram;  Surgeon: Sherren Kerns, MD;  Location: [facility] INVASIVE CV LAB;  Service: Cardiovascular;  Laterality: N/A;  . PERIPHERAL VASCULAR CATHETERIZATION Left 02/28/2015   Procedure: A/V Shunt Intervention;  Surgeon: Sherren Kerns, MD;  Location: [facility] INVASIVE CV LAB;  Service: Cardiovascular;  Laterality: Left;  . REFRACTIVE SURGERY Bilateral ~ 2005   Family History  Problem Relation Age of Onset  . Heart attack Father 70  . Hypertension Father   . Hypertension Mother   . Cancer Mother   . Lupus Mother    Social History:  reports that he quit smoking about 2 years ago. His smoking use included Cigarettes. He started smoking about 34 years ago. He has a 12.50 pack-year smoking history. He has never used smokeless tobacco. He reports that he uses drugs, including Cocaine, "Crack" cocaine, and Marijuana. He reports that he does not drink alcohol.  ROS: As per HPI otherwise negative.  Physical Exam: Vitals:   06/11/16 0500 06/11/16 0600 06/11/16 0700 06/11/16 1236  BP: (!) 117/95 105/68  90/67  Pulse: 79 79  79  Resp: 17 18 16  (!) 21  Temp:   97.6 F (36.4 C) 97.3 F (36.3 C)  TempSrc:   Oral Oral  SpO2: 95% (!) 87%  100%  Weight:      Height:         General: Well developed, well nourished, in no acute distress. Head: Normocephalic, atraumatic, sclera non-icteric, mucus membranes are moist. Neck: Supple without lymphadenopathy/masses.  PCL Lungs: Clear bilaterally to auscultation without wheezes, rales, or rhonchi. Breathing is unlabored. Heart: RRR with normal S1, S2. 2/6 systolic murmur present Abdomen: Distended, but soft. Non-tender. Liver down 6 cm Musculoskeletal:  Strength and tone appear normal for age. Lower extremities: 1+ pitting edema in B LE. Neuro: Alert and oriented X 3. Moves all extremities spontaneously. Psych:  Responds to questions appropriately with a normal affect. Dialysis Access: LUE AVF which is aneurysmal, + bruit/thrill  Allergies  Allergen Reactions  . Vytorin [Ezetimibe-Simvastatin]     Weakness, muscle aches bad.  . Gabapentin Other (See Comments)    Twitching  . Oxycodone Nausea And Vomiting   Prior to Admission medications   Medication Sig Start Date End Date Taking? Authorizing Provider  albuterol (PROVENTIL HFA;VENTOLIN HFA) 108 (90 BASE) MCG/ACT inhaler Inhale 2 puffs into the lungs every 6 (six) hours as needed for wheezing or shortness of breath. 04/14/15  Yes Antoine Poche, MD  amLODipine (NORVASC) 10 MG tablet Take 1 tablet (10 mg total) by mouth daily.  03/27/14  Yes Antoine Poche, MD  aspirin 81 MG chewable tablet Chew 81 mg by mouth daily.   Yes Historical Provider, MD  atorvastatin (LIPITOR) 20 MG tablet Take 20 mg by mouth daily.   Yes Historical Provider, MD  carvedilol (COREG) 25 MG tablet Take 37.5 mg by mouth 2 (two) times daily with a meal.   Yes Historical Provider, MD  colchicine 0.6 MG tablet Take 0.6 mg by mouth 2 (two) times daily as needed (gout).    Yes Historical Provider, MD  diphenhydramine-acetaminophen (TYLENOL PM) 25-500 MG TABS Take 1-2 tablets by mouth at bedtime as needed (for pain and sleep).   Yes Historical Provider, MD  docusate sodium (COLACE) 100 MG capsule Take 3 mg by mouth 2 (two) times daily as needed for mild constipation.    Yes Historical Provider, MD  esomeprazole (NEXIUM) 40 MG capsule Take 40 mg by mouth daily at 12 noon.   Yes  Historical Provider, MD  isosorbide mononitrate (IMDUR) 30 MG 24 hr tablet TAKE (1) TABLET TWICE DAILY. 01/19/16  Yes Antoine Poche, MD  lidocaine-prilocaine (EMLA) cream Apply 1 application topically as needed. For port access 01/27/15  Yes Historical Provider, MD  multivitamin (RENA-VIT) TABS tablet Take 1 tablet by mouth daily.   Yes Historical Provider, MD  nitroGLYCERIN (NITROSTAT) 0.4 MG SL tablet Place 1 tablet (0.4 mg total) under the tongue every 5 (five) minutes as needed for chest pain. 10/01/15  Yes Antoine Poche, MD  sevelamer carbonate (RENVELA) 800 MG tablet Take 2,400 mg by mouth 2 (two) times daily. 06/04/16  Yes Historical Provider, MD  torsemide (DEMADEX) 20 MG tablet Take 40 mg by mouth 3 (three) times daily. Fluid   Yes Historical Provider, MD  pantoprazole (PROTONIX) 40 MG tablet Take 1 tablet (40 mg total) by mouth 2 (two) times daily. 01/03/16   Henderson Cloud, MD   Current Facility-Administered Medications  Medication Dose Route Frequency Provider Last Rate Last Dose  . 0.9 %  sodium chloride infusion  250 mL Intravenous PRN Chrystie Nose, MD      . Melene Muller ON 06/12/2016] 0.9 %  sodium chloride infusion   Intravenous Continuous Chrystie Nose, MD      . acetaminophen (TYLENOL) tablet 500-1,000 mg  500-1,000 mg Oral QHS PRN Clanford Cyndie Mull, MD       And  . diphenhydrAMINE (BENADRYL) capsule 25-50 mg  25-50 mg Oral Q6H PRN Clanford Cyndie Mull, MD   50 mg at 06/11/16 0057  . acetaminophen (TYLENOL) tablet 650 mg  650 mg Oral Q4H PRN Clanford L Johnson, MD      . albuterol (PROVENTIL) (2.5 MG/3ML) 0.083% nebulizer solution 2.5 mg  2.5 mg Nebulization Q2H PRN Lonia Blood, MD      . amLODipine (NORVASC) tablet 10 mg  10 mg Oral Daily Dyann Kief, PA-C   10 mg at 06/10/16 1234  . aspirin chewable tablet 81 mg  81 mg Oral Daily Dyann Kief, PA-C   81 mg at 06/11/16 1314  . atorvastatin (LIPITOR) tablet 20 mg  20 mg Oral q1800 Clanford Cyndie Mull, MD    20 mg at 06/10/16 1728  . carvedilol (COREG) tablet 37.5 mg  37.5 mg Oral BID WC Dyann Kief, PA-C   37.5 mg at 06/10/16 1728  . colchicine tablet 0.6 mg  0.6 mg Oral BID PRN Clanford L Johnson, MD      . docusate sodium (COLACE) capsule 100  mg  100 mg Oral BID PRN Clanford L Johnson, MD      . heparin ADULT infusion 100 units/mL (25000 units/245mL sodium chloride 0.45%)  1,750 Units/hr Intravenous Continuous Armandina Stammer, RPH   Stopped at 06/11/16 1300  . morphine 2 MG/ML injection 2 mg  2 mg Intravenous Q2H PRN Clanford Cyndie Mull, MD   2 mg at 06/11/16 1023  . multivitamin (RENA-VIT) tablet 1 tablet  1 tablet Oral Daily Clanford Cyndie Mull, MD   1 tablet at 06/10/16 1233  . nitroGLYCERIN (NITROGLYN) 2 % ointment 1 inch  1 inch Topical Q6H Houston Siren, MD   1 inch at 06/11/16 1314  . nitroGLYCERIN 50 mg in dextrose 5 % 250 mL (0.2 mg/mL) infusion  10-200 mcg/min Intravenous Titrated Marily Memos, MD   Stopped at 06/09/16 2100  . ondansetron (ZOFRAN) injection 4 mg  4 mg Intravenous Q6H PRN Clanford L Johnson, MD      . pantoprazole (PROTONIX) EC tablet 40 mg  40 mg Oral BID Dyann Kief, PA-C   40 mg at 06/11/16 1610  . sevelamer carbonate (RENVELA) tablet 2,400 mg  2,400 mg Oral BID WC Clanford Cyndie Mull, MD   2,400 mg at 06/10/16 1959  . sodium chloride flush (NS) 0.9 % injection 3 mL  3 mL Intravenous Q12H Chrystie Nose, MD      . sodium chloride flush (NS) 0.9 % injection 3 mL  3 mL Intravenous PRN Chrystie Nose, MD      . torsemide [facility]) tablet 40 mg  40 mg Oral TID Dyann Kief, PA-C   40 mg at 06/11/16 9604   Labs: Basic Metabolic Panel:  Recent Labs Lab 06/09/16 0740 06/10/16 0448 06/11/16 1300  NA 137 135 137  K 4.8 4.7 5.7*  CL 98* 98* 100*  CO2 26 22 25   GLUCOSE 117* 81 88  BUN 62* 69* 80*  CREATININE 12.28* 13.47* 14.57*  CALCIUM 8.8* 8.6* 8.4*  PHOS  --  8.1* 8.3*   Liver Function Tests:  Recent Labs Lab 06/09/16 0740 06/10/16 0448  06/11/16 1300  AST 9*  --   --   ALT 9*  --   --   ALKPHOS 42  --   --   BILITOT 0.8  --   --   PROT 6.9  --   --   ALBUMIN 3.5 3.3* 3.2*   CBC:  Recent Labs Lab 06/09/16 0740 06/10/16 0448  WBC 5.4 4.7  NEUTROABS 3.9  --   HGB 11.2* 10.8*  HCT 35.2* 36.1*  MCV 91.9 93.5  PLT 180 160   Cardiac Enzymes:  Recent Labs Lab 06/09/16 1419 06/09/16 1649 06/09/16 2017 06/10/16 0448 06/11/16 1300  TROPONINI 0.80* 1.14* 1.48* 1.91* 1.50*   CBG:  Recent Labs Lab 06/10/16 1606 06/10/16 2104 06/11/16 0752 06/11/16 1046 06/11/16 1233  GLUCAP 89 115* 88 76 81   Dialysis Orders:  MWThF schedule at [facility] - 4 hours, EDW 98kg, 3K/2.5Ca bath, AVF, BFR400, DFR 600 - Heparin 1000 u bolus + 500u/hr pump - Epogen 4K TIW - Hectoral q HD - Getting course of IV iron  Assessment/Plan: 1.  Chest pain/ NSTEMI (with reduced EF 25%): For LHC today. Per cardiology. 2.  ESRD: Last dialyzed Mon, needs HD today immediately after cath. 2K bath (K 5.7). Likely need another HD tomorrow due to volume. 5kg over EDW per weights. Pt reports only can tolerate 2.7L with each HD. Seems to cut HD  short often, will counsel. Adherence a big issue.  Presented NxStage as option 3.  Hypertension/volume: BP low, but with volume excess. UF as tolerated. Amlodipine d/c'd.Carvedilol lowered 4.  Anemia: Hgb 10.8. Start Aranesp weekly. 5.  Metabolic bone disease: Ca ok, Phos high. Continue Renvela for now, may need to increase dose. 6.  Nutrition: Alb low. Follow and will start supplements. 7. Type 2 DM: Per primary. 8. PVD: Hx fem-pop bypass. Per primary.  Ozzie Hoyle, PA-C 06/11/2016, 3:11 PM  [facility] I have seen and examined this patient and agree with the plan of care seen, eval, examined counseled patient,discussed with staff. .  Tyshay Adee L 06/11/2016, 4:18 PM  Pager: [phone]

## 2016-06-12 DIAGNOSIS — I5043 Acute on chronic combined systolic (congestive) and diastolic (congestive) heart failure: Secondary | ICD-10-CM

## 2016-06-12 LAB — GLUCOSE, CAPILLARY
GLUCOSE-CAPILLARY: 78 mg/dL (ref 65–99)
Glucose-Capillary: 79 mg/dL (ref 65–99)
Glucose-Capillary: 86 mg/dL (ref 65–99)
Glucose-Capillary: 83 mg/dL (ref 65–99)

## 2016-06-12 LAB — CBC
HCT: 34.5 % — ABNORMAL LOW (ref 39.0–52.0)
HEMOGLOBIN: 10.7 g/dL — AB (ref 13.0–17.0)
MCH: 27.9 pg (ref 26.0–34.0)
MCHC: 31 g/dL (ref 30.0–36.0)
MCV: 90.1 fL (ref 78.0–100.0)
Platelets: 169 10*3/uL (ref 150–400)
RBC: 3.83 MIL/uL — AB (ref 4.22–5.81)
RDW: 14.3 % (ref 11.5–15.5)
WBC: 5 10*3/uL (ref 4.0–10.5)

## 2016-06-12 LAB — COMPREHENSIVE METABOLIC PANEL
ALBUMIN: 3.2 g/dL — AB (ref 3.5–5.0)
ALT: 6 U/L — AB (ref 17–63)
AST: 10 U/L — AB (ref 15–41)
Alkaline Phosphatase: 41 U/L (ref 38–126)
Anion gap: 12 (ref 5–15)
BILIRUBIN TOTAL: 0.8 mg/dL (ref 0.3–1.2)
BUN: 56 mg/dL — AB (ref 6–20)
CHLORIDE: 99 mmol/L — AB (ref 101–111)
CO2: 25 mmol/L (ref 22–32)
CREATININE: 12.47 mg/dL — AB (ref 0.61–1.24)
Calcium: 8.6 mg/dL — ABNORMAL LOW (ref 8.9–10.3)
GFR calc Af Amer: 5 mL/min — ABNORMAL LOW (ref >60)
GFR calc non Af Amer: 4 mL/min — ABNORMAL LOW (ref >60)
GLUCOSE: 91 mg/dL (ref 65–99)
Potassium: 4.6 mmol/L (ref 3.5–5.1)
Sodium: 136 mmol/L (ref 135–145)
Total Protein: 6.4 g/dL — ABNORMAL LOW (ref 6.5–8.1)

## 2016-06-12 MED ORDER — ISOSORBIDE MONONITRATE ER 30 MG PO TB24
30.0000 mg | ORAL_TABLET | Freq: Every day | ORAL | Status: DC
  Administered 2016-06-13: 30 mg via ORAL
  Filled 2016-06-12: qty 1

## 2016-06-12 MED ORDER — MORPHINE SULFATE (PF) 2 MG/ML IV SOLN
2.0000 mg | INTRAVENOUS | Status: DC | PRN
  Administered 2016-06-12 – 2016-06-13 (×5): 2 mg via INTRAVENOUS
  Filled 2016-06-12 (×5): qty 1

## 2016-06-12 MED ORDER — ACETAMINOPHEN 325 MG PO TABS
650.0000 mg | ORAL_TABLET | ORAL | Status: DC | PRN
Start: 2016-06-12 — End: 2016-06-13

## 2016-06-12 MED ORDER — LISINOPRIL 2.5 MG PO TABS
2.5000 mg | ORAL_TABLET | Freq: Every day | ORAL | Status: DC
  Administered 2016-06-12: 2.5 mg via ORAL
  Filled 2016-06-12: qty 1

## 2016-06-12 MED ORDER — CARVEDILOL 6.25 MG PO TABS
6.2500 mg | ORAL_TABLET | Freq: Two times a day (BID) | ORAL | Status: DC
  Administered 2016-06-12 – 2016-06-13 (×2): 6.25 mg via ORAL
  Filled 2016-06-12 (×2): qty 1

## 2016-06-12 MED ORDER — SEVELAMER CARBONATE 2.4 G PO PACK
4.8000 g | PACK | Freq: Three times a day (TID) | ORAL | Status: DC
  Administered 2016-06-12 – 2016-06-13 (×3): 4.8 g via ORAL
  Filled 2016-06-12 (×5): qty 2

## 2016-06-12 MED ORDER — MORPHINE SULFATE (PF) 4 MG/ML IV SOLN
INTRAVENOUS | Status: AC
  Administered 2016-06-12: 2 mg
  Filled 2016-06-12: qty 1

## 2016-06-12 MED ORDER — ENOXAPARIN SODIUM 30 MG/0.3ML ~~LOC~~ SOLN
30.0000 mg | SUBCUTANEOUS | Status: DC
Start: 2016-06-12 — End: 2016-06-13
  Filled 2016-06-12: qty 0.3

## 2016-06-12 MED ORDER — DOCUSATE SODIUM 100 MG PO CAPS
100.0000 mg | ORAL_CAPSULE | Freq: Two times a day (BID) | ORAL | Status: DC
  Administered 2016-06-12 – 2016-06-13 (×3): 100 mg via ORAL
  Filled 2016-06-12 (×3): qty 1

## 2016-06-12 NOTE — Progress Notes (Signed)
Pt off unit to dialysis

## 2016-06-12 NOTE — Progress Notes (Signed)
[place] TEAM 1 - Stepdown/ICU TEAM  [name]  WUJ:[id number] DOB: 1970/12/22 DOA: 06/09/2016 PCP: Louie Boston, MD    Brief Narrative:  46 y.o. male with known CAD, CKD on hemodialysis MWF, CHF, HTN and diet controlled DM who presented to the [facility] ED by EMS with complaints of ongoing chest pain.  He reported that he took 3 NTG with no relief.  He also took 3-4 baby aspirin with no relief.   He was seen by Cardiology in the [facility] ED and noted to have elevated troponin and elevated BNP.  He missed his HD the day of his presentation.  His pain improved with Dilaudid and NTG.  The patient was started on IV nitro infusion, and arrangements were made for his transfer to [facility] for cardiac eval when his troponin was noted to be increasing.     Subjective: The patient is resting comfortably in bed.  He reports some nonspecific bilateral intermittent chest pain but states that he has had this for quite a long time and it is not new.  He denies substernal chest pressure.  He reports some crampy diffuse abdominal pain but states that he is moving his bowels.  He feels that his abdomen is distended.  He denies shortness of breath.  He is anxious to be discharged home as soon as possible.  Assessment & Plan:  Chest pain w/ known CAD Known CAD has been evaluated at [facility] previously w/ distal small vessel disease noted on caths (most recent Dec 2016?) - trop peaked at 1.91 - cardiac cath 2/2 noted total occlusion RCA w/ collaterals, and 55% proximal LAD lesion - Cards following - plan for now is medical management   Severe combined systolic and diastolic CHF - Severe Pulm HTN EF markedly decreased at 25% on TTE 1/31 w/ grade 2 DD - EF was 55% on TTE July 2015, w/ grade 2 DD - volume control will be w/ HD and pt has hx of signing off early per Nephrology hx - R heart cath noted severe pulm HTN at 63mm Hg mean   ESRD on HD Nephrology following and directing HD - EDW reported as 98kg Filed Weights     06/10/16 0500 06/12/16 0200 06/12/16 0443  Weight: 103.5 kg (228 lb 2.8 oz) 103.7 kg (228 lb 9.9 oz) 101.3 kg (223 lb 5.2 oz)    Hyperlipidemia Cont statin   DM2 CBG well controlled - on no meds as outpt   HTN BP reasonably well controlled at present - follow w/o change today   PVD s/p R femoropopliteal bypass 07/2013 - currently asymptomatic   DVT prophylaxis: lovenox  Code Status: FULL CODE Family Communication: no family present at time of exam  Disposition Plan: SDU - continue serial HD for volume removal   Consultants:  [facility] Cardiology Nephrology   Procedures: 1/31 TTE - EF25% - diffuse hypokinesis - grade 2 DD  Antimicrobials:  none  Objective: Blood pressure 124/76, pulse 83, temperature 97.9 F (36.6 C), temperature source Axillary, resp. rate 16, height 5\' 8"  (1.727 m), weight 101.3 kg (223 lb 5.2 oz), SpO2 95 %.  Intake/Output Summary (Last 24 hours) at 06/12/16 1009 Last data filed at 06/12/16 0443  Gross per 24 hour  Intake              250 ml  Output             2220 ml  Net            -  1970 ml   Filed Weights   06/10/16 0500 06/12/16 0200 06/12/16 0443  Weight: 103.5 kg (228 lb 2.8 oz) 103.7 kg (228 lb 9.9 oz) 101.3 kg (223 lb 5.2 oz)    Examination: General: No acute respiratory distress at rest  Lungs: Clear to auscultation bilaterally  Cardiovascular: Regular rate and rhythm - no rub or gallup  Abdomen: Nontender, protuberent, soft, bowel sounds positive, no rebound, no ascites, no appreciable mass Extremities: No significant cyanosis or clubbing - 1+ edema bilateral lower extremities w/o signif change   CBC:  Recent Labs Lab 06/09/16 0740 06/10/16 0448 06/11/16 1900  WBC 5.4 4.7 5.1  NEUTROABS 3.9  --   --   HGB 11.2* 10.8* 10.3*  HCT 35.2* 36.1* 33.5*  MCV 91.9 93.5 91.3  PLT 180 160 171   Basic Metabolic Panel:  Recent Labs Lab 06/09/16 0740 06/10/16 0448 06/11/16 1300 06/11/16 1900  NA 137 135 137  --   K 4.8 4.7 5.7*   --   CL 98* 98* 100*  --   CO2 26 22 25   --   GLUCOSE 117* 81 88  --   BUN 62* 69* 80*  --   CREATININE 12.28* 13.47* 14.57* 15.72*  CALCIUM 8.8* 8.6* 8.4*  --   PHOS  --  8.1* 8.3*  --    GFR: Estimated Creatinine Clearance: 6.8 mL/min (by C-G formula based on SCr of 15.72 mg/dL (H)).  Liver Function Tests:  Recent Labs Lab 06/09/16 0740 06/10/16 0448 06/11/16 1300  AST 9*  --   --   ALT 9*  --   --   ALKPHOS 42  --   --   BILITOT 0.8  --   --   PROT 6.9  --   --   ALBUMIN 3.5 3.3* 3.2*    Coagulation Profile:  Recent Labs Lab 06/10/16 1545  INR 1.28    Cardiac Enzymes:  Recent Labs Lab 06/09/16 1419 06/09/16 1649 06/09/16 2017 06/10/16 0448 06/11/16 1300  TROPONINI 0.80* 1.14* 1.48* 1.91* 1.50*    HbA1C: Hgb A1c MFr Bld  Date/Time Value Ref Range Status  07/18/2013 05:00 AM 8.3 (H) <5.7 % Final    Comment:    (NOTE)                                                                       According to the ADA Clinical Practice Recommendations for 2011, when HbA1c is used as a screening test:  >=6.5%   Diagnostic of Diabetes Mellitus           (if abnormal result is confirmed) 5.7-6.4%   Increased risk of developing Diabetes Mellitus References:Diagnosis and Classification of Diabetes Mellitus,Diabetes Care,2011,34(Suppl 1):S62-S69 and Standards of Medical Care in         Diabetes - 2011,Diabetes Care,2011,34 (Suppl 1):S11-S61.  03/28/2012 01:13 PM 7.9 (H) <5.7 % Final    Comment:    (NOTE)  According to the ADA Clinical Practice Recommendations for 2011, when HbA1c is used as a screening test:  >=6.5%   Diagnostic of Diabetes Mellitus           (if abnormal result is confirmed) 5.7-6.4%   Increased risk of developing Diabetes Mellitus References:Diagnosis and Classification of Diabetes Mellitus,Diabetes Care,2011,34(Suppl 1):S62-S69 and Standards of Medical Care in          Diabetes - 2011,Diabetes Care,2011,34 (Suppl 1):S11-S61.    CBG:  Recent Labs Lab 06/11/16 1046 06/11/16 1233 06/11/16 1553 06/11/16 2132 06/12/16 0808  GLUCAP 76 81 82 92 78    Recent Results (from the past 240 hour(s))  MRSA PCR Screening     Status: None   Collection Time: 06/09/16  3:01 PM  Result Value Ref Range Status   MRSA by PCR NEGATIVE NEGATIVE Final    Comment:        The GeneXpert MRSA Assay (FDA approved for NASAL specimens only), is one component of a comprehensive MRSA colonization surveillance program. It is not intended to diagnose MRSA infection nor to guide or monitor treatment for MRSA infections.      Scheduled Meds: . aspirin  81 mg Oral Daily  . atorvastatin  80 mg Oral q1800  . carvedilol  3.125 mg Oral BID WC  . enoxaparin (LOVENOX) injection  30 mg Subcutaneous Q24H  . multivitamin  1 tablet Oral Daily  . nitroGLYCERIN  1 inch Topical Q6H  . pantoprazole  40 mg Oral BID  . sevelamer carbonate  4.8 g Oral TID WC  . sodium chloride flush  3 mL Intravenous Q12H     LOS: 2 days   Lonia Blood, MD [facility] Office  [phone] Pager - Text Page per Loretha Stapler as per below:  On-Call/Text Page:      [email or web address]      password [email or web address]  If 7PM-7AM, please contact night-coverage [email or web address] Password [email or web address] 06/12/2016, 10:09 AM

## 2016-06-12 NOTE — Progress Notes (Signed)
Subjective: Interval History: has complaints itching, ??if phos up.  Objective: Vital signs in last 24 hours: Temp:  [97.3 F (36.3 C)-98.1 F (36.7 C)] 97.9 F (36.6 C) (02/03 0810) Pulse Rate:  [0-95] 83 (02/03 0810) Resp:  [0-34] 16 (02/03 0810) BP: (90-174)/(33-103) 124/76 (02/03 0810) SpO2:  [0 %-100 %] 95 % (02/03 0810) Weight:  [101.3 kg (223 lb 5.2 oz)-103.7 kg (228 lb 9.9 oz)] 101.3 kg (223 lb 5.2 oz) (02/03 0443) Weight change:   Intake/Output from previous day: 02/02 0701 - 02/03 0700 In: 250 [P.O.:240; I.V.:10] Out: 2220 [Urine:20] Intake/Output this shift: No intake/output data recorded.  General appearance: alert, cooperative, no distress and moderately obese Resp: clear to auscultation bilaterally Cardio: S1, S2 normal and systolic murmur: systolic ejection 2/6, decrescendo at 2nd left intercostal space GI: obese, pos bs, soft Extremities: AVF LUA   Lab Results:  Recent Labs  06/10/16 0448 06/11/16 1900  WBC 4.7 5.1  HGB 10.8* 10.3*  HCT 36.1* 33.5*  PLT 160 171   BMET:  Recent Labs  06/10/16 0448 06/11/16 1300 06/11/16 1900  NA 135 137  --   K 4.7 5.7*  --   CL 98* 100*  --   CO2 22 25  --   GLUCOSE 81 88  --   BUN 69* 80*  --   CREATININE 13.47* 14.57* 15.72*  CALCIUM 8.6* 8.4*  --    No results for input(s): PTH in the last 72 hours. Iron Studies: No results for input(s): IRON, TIBC, TRANSFERRIN, FERRITIN in the last 72 hours.  Studies/Results: No results found.  I have reviewed the patient's current medications.  Assessment/Plan: 1 ESRD poor adherence leading to his prob CV, beligerent about his issues.  Discussed NxStage 2 HTN ok on less meds 3 Anemia stable 4 HPTH cont vit D 5 Obesity 6 CAD ??findings, per cards 7 Nonadherence P HD again, lower vol, ^ binders    LOS: 2 days   [name] L 06/12/2016,8:48 AM

## 2016-06-12 NOTE — Progress Notes (Signed)
Pt lying in bed, sleeping.  No s/s of any acute distress.  Arouses easily. Call bell in reach.

## 2016-06-12 NOTE — Progress Notes (Signed)
Pt still having some CP which is about the same it has consistently been at all day.  Transporter here to pick pt up for HD.  Clarified with Dr.McClung if it would be ok for pt to go to HD.  "Ok" per Dr.McClung.  Report given to HD nurse.  States, "will give morphine at 1930"

## 2016-06-12 NOTE — Progress Notes (Addendum)
Patient ID: [name], male   DOB: January 17, 1971, 46 y.o.   MRN: [id number]   SUBJECTIVE: No chest pain today, denies dyspnea.    LHC/RHC (2/2) with totally occluded RCA, R=> L collaterals, 55% prox to mid LAD.  Mean RA 27, PA 83/48 mean 63, mean PCWP 50.   Echo (1/18) with EF 25%, diffuse hypokinesis.   Scheduled Meds: . aspirin  81 mg Oral Daily  . atorvastatin  80 mg Oral q1800  . carvedilol  6.25 mg Oral BID WC  . docusate sodium  100 mg Oral BID  . enoxaparin (LOVENOX) injection  30 mg Subcutaneous Q24H  . [START ON 06/13/2016] isosorbide mononitrate  30 mg Oral Daily  . lisinopril  2.5 mg Oral Daily  . multivitamin  1 tablet Oral Daily  . nitroGLYCERIN  1 inch Topical Q6H  . pantoprazole  40 mg Oral BID  . sevelamer carbonate  4.8 g Oral TID WC   Continuous Infusions: PRN Meds:.acetaminophen, albuterol, colchicine, [DISCONTINUED] acetaminophen **AND** diphenhydrAMINE, morphine injection, ondansetron (ZOFRAN) IV  Vitals:   06/12/16 0345 06/12/16 0415 06/12/16 0443 06/12/16 0810  BP: 136/70 119/84 132/69 124/76  Pulse: 78 80 74 83  Resp: 16 16 16 16   Temp:   97.4 F (36.3 C) 97.9 F (36.6 C)  TempSrc:   Oral Axillary  SpO2:    95%  Weight:   223 lb 5.2 oz (101.3 kg)   Height:        Intake/Output Summary (Last 24 hours) at 06/12/16 1049 Last data filed at 06/12/16 0443  Gross per 24 hour  Intake              250 ml  Output             2220 ml  Net            -1970 ml    LABS: Basic Metabolic Panel:  Recent Labs  78/29/56 0448 06/11/16 1300 06/11/16 1900  NA 135 137  --   K 4.7 5.7*  --   CL 98* 100*  --   CO2 22 25  --   GLUCOSE 81 88  --   BUN 69* 80*  --   CREATININE 13.47* 14.57* 15.72*  CALCIUM 8.6* 8.4*  --   PHOS 8.1* 8.3*  --    Liver Function Tests:  Recent Labs  06/10/16 0448 06/11/16 1300  ALBUMIN 3.3* 3.2*   No results for input(s): LIPASE, AMYLASE in the last 72 hours. CBC:  Recent Labs  06/10/16 0448 06/11/16 1900  WBC 4.7  5.1  HGB 10.8* 10.3*  HCT 36.1* 33.5*  MCV 93.5 91.3  PLT 160 171   Cardiac Enzymes:  Recent Labs  06/09/16 2017 06/10/16 0448 06/11/16 1300  TROPONINI 1.48* 1.91* 1.50*   BNP: Invalid input(s): POCBNP D-Dimer: No results for input(s): DDIMER in the last 72 hours. Hemoglobin A1C: No results for input(s): HGBA1C in the last 72 hours. Fasting Lipid Panel:  Recent Labs  06/10/16 0448  CHOL 84  HDL 28*  LDLCALC 40  TRIG 78  CHOLHDL 3.0   Thyroid Function Tests: No results for input(s): TSH, T4TOTAL, T3FREE, THYROIDAB in the last 72 hours.  Invalid input(s): FREET3 Anemia Panel: No results for input(s): VITAMINB12, FOLATE, FERRITIN, TIBC, IRON, RETICCTPCT in the last 72 hours.  RADIOLOGY: Dg Chest 2 View  Result Date: 06/09/2016 CLINICAL DATA:  Left-sided chest pain for 1 day EXAM: CHEST  2 VIEW COMPARISON:  01/01/2016 FINDINGS: Cardiac shadow  remains enlarged. The lungs are well aerated bilaterally. No focal infiltrate or sizable effusion is seen. No bony abnormality is noted. Previously seen bilateral infiltrates have cleared in the interval. IMPRESSION: No active cardiopulmonary disease. Electronically Signed   By: Alcide Clever M.D.   On: 06/09/2016 08:20    PHYSICAL EXAM General: NAD Neck: JVP 16 cm, no thyromegaly or thyroid nodule.  Lungs: Crackles at bases.  CV: Nondisplaced PMI.  Heart regular S1/S2, no S3/S4, no murmur.  1+ edema to knees bilaterally.   Abdomen: Soft, nontender, no hepatosplenomegaly, mild distention.  Neurologic: Alert and oriented x 3.  Psych: Normal affect. Extremities: No clubbing or cyanosis.   TELEMETRY: Reviewed telemetry pt in NSR  ASSESSMENT AND PLAN: 47 yo with history of PAD s/p right fem-pop, ESRD, DM, smoking, HTN was found to have NSTEMI with EF down to 25%.   1. CAD: NSTEMI, peak troponin 1.91.  LHC 2/2 showed chronically occluded RCA with collaterals and long 55% proximal to mid LAD stenosis.  No intervention.  No chest  pain.  - Continue ASA 81 and atorvastatin 80.  2. Acute on chronic systolic CHF: EF 97%.  Suspect primarily ischemic cardiomyopathy though EF is lower than I would expect with just RCA occlusion. RHC suggested marked volume overload, exam today also suggests marked volume overload.  Lack of significant symptoms appears to suggest that this is a chronic condition.  - Needs HD today with further fluid removal.  - HD limited by cramping and not by low BP.  Would increase Coreg to 6.25 mg bid (some evidence for use of Coreg in HD patients with cardiomyopathy) and would add low dose ACEI as long as BP tolerates at HD (suspect it will).  3. ESRD: Needs HD today for volume removal.  4. PAD: Stable, ASA + statin.  5. 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   Marca Ancona 06/12/2016 10:55 AM

## 2016-06-12 NOTE — Progress Notes (Signed)
Pt returned from dialysis, report received from [facility].

## 2016-06-12 NOTE — Progress Notes (Signed)
Report obtained at this time.  Taking over care of pt.

## 2016-06-13 DIAGNOSIS — I209 Angina pectoris, unspecified: Secondary | ICD-10-CM

## 2016-06-13 DIAGNOSIS — I5023 Acute on chronic systolic (congestive) heart failure: Secondary | ICD-10-CM

## 2016-06-13 LAB — CBC
HCT: 33.7 % — ABNORMAL LOW (ref 39.0–52.0)
Hemoglobin: 10.2 g/dL — ABNORMAL LOW (ref 13.0–17.0)
MCH: 27.6 pg (ref 26.0–34.0)
MCHC: 30.3 g/dL (ref 30.0–36.0)
MCV: 91.1 fL (ref 78.0–100.0)
PLATELETS: 152 10*3/uL (ref 150–400)
RBC: 3.7 MIL/uL — ABNORMAL LOW (ref 4.22–5.81)
RDW: 14.3 % (ref 11.5–15.5)
WBC: 4.5 10*3/uL (ref 4.0–10.5)

## 2016-06-13 LAB — RENAL FUNCTION PANEL
ALBUMIN: 3.3 g/dL — AB (ref 3.5–5.0)
Anion gap: 14 (ref 5–15)
BUN: 43 mg/dL — AB (ref 6–20)
CO2: 27 mmol/L (ref 22–32)
CREATININE: 10.75 mg/dL — AB (ref 0.61–1.24)
Calcium: 8.9 mg/dL (ref 8.9–10.3)
Chloride: 95 mmol/L — ABNORMAL LOW (ref 101–111)
GFR calc Af Amer: 6 mL/min — ABNORMAL LOW (ref >60)
GFR, EST NON AFRICAN AMERICAN: 5 mL/min — AB (ref >60)
Glucose, Bld: 94 mg/dL (ref 65–99)
Phosphorus: 6.1 mg/dL — ABNORMAL HIGH (ref 2.5–4.6)
Potassium: 4.2 mmol/L (ref 3.5–5.1)
SODIUM: 136 mmol/L (ref 135–145)

## 2016-06-13 LAB — GLUCOSE, CAPILLARY
GLUCOSE-CAPILLARY: 76 mg/dL (ref 65–99)
Glucose-Capillary: 102 mg/dL — ABNORMAL HIGH (ref 65–99)

## 2016-06-13 MED ORDER — ATORVASTATIN CALCIUM 80 MG PO TABS
80.0000 mg | ORAL_TABLET | Freq: Every day | ORAL | 0 refills | Status: AC
Start: 2016-06-13 — End: ?

## 2016-06-13 MED ORDER — CARVEDILOL 6.25 MG PO TABS: 6.2500 mg | ORAL_TABLET | Freq: Two times a day (BID) | ORAL | 0 refills | Status: DC

## 2016-06-13 NOTE — Discharge Instructions (Signed)
Heart Failure  Heart failure means your heart has trouble pumping blood. This makes it hard for your body to work well. Heart failure is usually a long-term (chronic) condition. You must take good care of yourself and follow your doctor's treatment plan.  HOME CARE   Take your heart medicine as told by your doctor.    Do not stop taking medicine unless your doctor tells you to.    Do not skip any dose of medicine.    Refill your medicines before they run out.    Take other medicines only as told by your doctor or pharmacist.   Stay active if told by your doctor. The elderly and people with severe heart failure should talk with a doctor about physical activity.   Eat heart-healthy foods. Choose foods that are without trans fat and are low in saturated fat, cholesterol, and salt (sodium). This includes fresh or frozen fruits and vegetables, fish, lean meats, fat-free or low-fat dairy foods, whole grains, and high-fiber foods. Lentils and dried peas and beans (legumes) are also good choices.   Limit salt if told by your doctor.   Cook in a healthy way. Roast, grill, broil, bake, poach, steam, or stir-fry foods.   Limit fluids as told by your doctor.   Weigh yourself every morning. Do this after you pee (urinate) and before you eat breakfast. Write down your weight to give to your doctor.   Take your blood pressure and write it down if your doctor tells you to.   Ask your doctor how to check your pulse. Check your pulse as told.   Lose weight if told by your doctor.   Stop smoking or chewing tobacco. Do not use gum or patches that help you quit without your doctor's approval.   Schedule and go to doctor visits as told.   Nonpregnant women should have no more than 1 drink a day. Men should have no more than 2 drinks a day. Talk to your doctor about drinking alcohol.   Stop illegal drug use.   Stay current with shots (immunizations).   Manage your health conditions as told by your doctor.   Learn to  manage your stress.   Rest when you are tired.   If it is really hot outside:    Avoid intense activities.    Use air conditioning or fans, or get in a cooler place.    Avoid caffeine and alcohol.    Wear loose-fitting, lightweight, and light-colored clothing.   If it is really cold outside:    Avoid intense activities.    Layer your clothing.    Wear mittens or gloves, a hat, and a scarf when going outside.    Avoid alcohol.   Learn about heart failure and get support as needed.   Get help to maintain or improve your quality of life and your ability to care for yourself as needed.  GET HELP IF:    You gain weight quickly.   You are more short of breath than usual.   You cannot do your normal activities.   You tire easily.   You cough more than normal, especially with activity.   You have any or more puffiness (swelling) in areas such as your hands, feet, ankles, or belly (abdomen).   You cannot sleep because it is hard to breathe.   You feel like your heart is beating fast (palpitations).   You get dizzy or light-headed when you stand up.  GET HELP   are not doing well or get worse. This information is not intended to replace advice given to you by your health care provider. Make sure you discuss any questions you have with your health care provider. Document Released: 02/03/2008 Document Revised: 05/17/2014 Document Reviewed: 06/12/2012 Elsevier Interactive Patient Education  2017 [employer] Inc[employer]  Coronary Artery Disease, Male Coronary artery disease (CAD) is a process in which the blood vessels of the heart (coronary arteries) become narrow or blocked. The narrowing or blockage can lead to  decreased blood flow to the heart muscle (angina). Prolonged reduced blood flow can cause a heart attack (myocardial infarction, MI). Because CAD is the leading cause of death in men, it is important to understand what causes this condition and how it is treated. What are the causes? Atherosclerosis is the cause of CAD. This is the buildup of fat and cholesterol (plaque) on the inside of the arteries. Over time, the plaque may narrow or block the artery, and this will lessen blood flow to the heart. Plaque can also become weak and break off within a coronary artery to form a clot and cause a sudden blockage. What increases the risk? Many risk factors increase your chances of getting CAD, including:  High cholesterol levels.  High blood pressure (hypertension).  Tobacco use.  Diabetes.  Age. Men over age 80 are at a greater risk of CAD.  Gender. Men often develop CAD earlier in life than women.  Family history of CAD.  Obesity.  Lack of exercise.  A diet high in saturated fats. What are the signs or symptoms? Many people do not experience any symptoms during the early stages of CAD. As the condition progresses, symptoms may include:  Chest pain.  The pain can be described as a crushing or squeezing in the chest, or a tightness, pressure, fullness, or heaviness in the chest.  The pain can last more than a few minutes or can stop and recur.  Pain in the arms, neck, jaw, or back.  Unexplained heartburn or indigestion.  Shortness of breath.  Nausea.  Sudden cold sweats. Less common symptoms of CAD in men can include:  Fatigue.  Unexplained feelings of nervousness or anxiety.  Weakness.  Diarrhea.  Sudden light-headedness. How is this diagnosed? Tests to diagnose CAD may include:  ECG (electrocardiogram).  Exercise stress test. This looks for signs of blockage when the heart is being exercised.  Pharmacologic stress test. This test looks for signs of blockage  when the heart is being stressed with a medicine.  Blood tests.  Coronary angiogram. This is a procedure to look at the coronary arteries to see if there is any blockage. How is this treated? The treatment of CAD may include the following:  Healthy behavioral changes to reduce or control risk factors.  Medicine.  Coronary stenting.A stent helps to keep an artery open.  Coronary angioplasty. This procedure widens a narrowed or blocked artery.  Coronary arterybypass surgery. This will allow your blood to pass the blockage (bypass) to reach your heart. Follow these instructions at home:  Take medicines only as directed by your health care provider.  Do not take the following medicines unless your health care provider approves:  Nonsteroidal anti-inflammatory drugs (NSAIDs), such as ibuprofen, naproxen, or celecoxib.  Vitamin supplements that contain vitamin A, vitamin E, or both.  Manage other health conditions such as hypertension and diabetes as directed by your health care provider.  Follow a heart-healthy diet. A dietitian can help to educate you about  healthy food options and changes.  Use healthy cooking methods such as roasting, grilling, broiling, baking, poaching, steaming, or stir-frying. Talk to a dietitian to learn more about healthy cooking methods.  Follow an exercise program approved by your health care provider.  Maintain a healthy weight. Lose weight as approved by your health care provider.  Plan rest periods when fatigued.  Learn to manage stress.  Do not use any tobacco products, including cigarettes, chewing tobacco, or electronic cigarettes. If you need help quitting, ask your health care provider.  If you drink alcohol, and your health care provider approves, limit your alcohol intake to no more than 1 drink per day. One drink equals 12 ounces of beer, 5 ounces of wine, or 1 ounces of hard liquor.  Stop illegal drug use.  Your health care provider  may ask you to monitor your blood pressure. A blood pressure reading consists of a higher number over a lower number, such as 110 over 72, which is written as 110/72. Ideally, your blood pressure should be:  Below 140/90 if you have no other medical conditions.  Below 130/80 if you have diabetes or kidney disease.  Keep all follow-up visits as directed by your health care provider. This is important. Get help right away if:  You have pain in your chest, neck, arm, jaw, stomach, or back that lasts more than a few minutes, is recurring, or is unrelieved by taking medicine under your tongue (sublingualnitroglycerin).  You have profuse sweating without cause.  You have unexplained:  Heartburn or indigestion.  Shortness of breath or difficulty breathing.  Nausea or vomiting.  Fatigue.  Feelings of nervousness or anxiety.  Weakness.  Diarrhea.  You have sudden light-headedness or dizziness.  You faint. These symptoms may represent a serious problem that is an emergency. Do not wait to see if the symptoms will go away. Get medical help right away. Call your local emergency services (911 in the [place] Do not drive yourself to the hospital.  This information is not intended to replace advice given to you by your health care provider. Make sure you discuss any questions you have with your health care provider. Document Released: 11/21/2013 Document Revised: 10/02/2015 Document Reviewed: 08/28/2013 Elsevier Interactive Patient Education  2017 [employer].

## 2016-06-13 NOTE — Progress Notes (Signed)
Patient given d/c instructions and scripts and verbalizes understanding. Picc d/c'd by IV team. Patient with no complaints at the current time.

## 2016-06-13 NOTE — Discharge Summary (Signed)
DISCHARGE SUMMARY  [name]  MR#: [id number]  DOB:June 29, 1970  Date of Admission: 06/09/2016 Date of Discharge: 06/13/2016  Attending Physician:[name]  Patient's WUJ:WJXBJY,NWGNF B, MD  Consults: Nephrology  [facility] Cardiology  Disposition: D/C home at patient insistence   Follow-up Appts: Follow-up Information    Ricky B, MD. Schedule an appointment as soon as possible for a visit in 3 day(s).   Specialty:  Family Medicine Contact information: [address] [place] [address] [phone]        Ricky Docker, MD. Schedule an appointment as soon as possible for a visit in 2 week(s).   Specialty:  Cardiology Contact information: [address] [place] [address] [phone]           Tests Needing Follow-up: -ongoing monitoring of volume status and counseling on HD compliance is needed  Discharge Diagnoses: Chest pain w/ known CAD 55% proximal LAD occlusion  Severe combined systolic and diastolic CHF - Severe Pulm HTN ESRD on HD Hyperlipidemia DM2 HTN PVD  Initial presentation: 46 y.o.malewith known CAD, CKD on hemodialysis MWF, CHF, HTN and diet controlled DM who presented to the [facility] by EMS with complaints of ongoing chest pain. He reported that he took 3 NTG with no relief. He also took 3-4 baby aspirin with no relief.   He was seen by Cardiology in the [facility] ED and noted to have elevated troponin and elevated BNP. He missed his HD the day of his presentation. His pain improved with Dilaudid and NTG.  The patient was started on IV nitro infusion, and arrangements were made for his transfer to [facility] for cardiac eval when his troponin was noted to be increasing.  Hospital Course:  On 06/13/16 the patient insisted that he be discharged home.  He refused to allow any further inpatient hemodialysis.  He was counseled by the cardiology team, the nephrology team, and the hospitalist that we felt that significant volume  removal via dialysis still needed to be accomplished as an inpatient.  The patient acknowledged that we wished to do this but refused to allow it stating that he would simply return to his outpatient dialysis schedule.  The patient was counseled by this physician on the risk of doing so but he persisted in his insistence that he be discharged, refusing further inpt HD.  The patient was counseled that it was absolutely necessary for him to stick strictly with his outpatient dialysis schedule and to allow complete full treatments at each session.  The patient was advised to follow up closely with his primary care physician and his nephrologist and to establish care with the [facility] cardiology office in [place].  Chest pain w/ known CAD Known CAD has been evaluated at [facility] and [facility] previously w/ distal small vessel disease noted on caths (most recent Dec 2016?) - trop peaked at 1.91 - cardiac cath 2/2 noted total occlusion RCA w/ collaterals, and 55% proximal LAD lesion - Cards following - plan for now is medical management   Severe combined systolic and diastolic CHF - Severe Pulm HTN EF markedly decreased at 25% on TTE 1/31 w/ grade 2 DD - EF was 55% on TTE July 2015, w/ grade 2 DD - volume control will be w/ HD and pt has hx of signing off early per Nephrology hx - R heart cath noted severe pulm HTN at 63mm Hg mean - BB dose adjusted this hospital stay per Cards - Nephrology did not feel ACEi was  wise    ESRD on HD Nephrology following and directing HD - EDW reported as 98kg  Hyperlipidemia Cont statin - dose increased to 80mg  QD   DM2 CBG well controlled - on no meds as outpt   HTN BP reasonably well controlled at present - follow w/o change today   PVD s/p R femoropopliteal bypass 07/2013 - currently asymptomatic   Allergies as of 06/13/2016      Reactions   Vytorin [ezetimibe-simvastatin]    Weakness, muscle aches bad.   Gabapentin Other (See Comments)   Twitching   Oxycodone Nausea And  Vomiting      Medication List    STOP taking these medications   amLODipine 10 MG tablet Commonly known as:  NORVASC   esomeprazole 40 MG capsule Commonly known as:  NEXIUM   torsemide 20 MG tablet Commonly known as:  DEMADEX     TAKE these medications   albuterol 108 (90 Base) MCG/ACT inhaler Commonly known as:  PROVENTIL HFA;VENTOLIN HFA Inhale 2 puffs into the lungs every 6 (six) hours as needed for wheezing or shortness of breath.   aspirin 81 MG chewable tablet Chew 81 mg by mouth daily.   atorvastatin 80 MG tablet Commonly known as:  LIPITOR Take 1 tablet (80 mg total) by mouth daily at 6 PM. What changed:  medication strength  how much to take  when to take this   carvedilol 6.25 MG tablet Commonly known as:  COREG Take 1 tablet (6.25 mg total) by mouth 2 (two) times daily with a meal. What changed:  medication strength  how much to take   colchicine 0.6 MG tablet Take 0.6 mg by mouth 2 (two) times daily as needed (gout).   diphenhydramine-acetaminophen 25-500 MG Tabs tablet Commonly known as:  TYLENOL PM Take 1-2 tablets by mouth at bedtime as needed (for pain and sleep).   docusate sodium 100 MG capsule Commonly known as:  COLACE Take 3 mg by mouth 2 (two) times daily as needed for mild constipation.   isosorbide mononitrate 30 MG 24 hr tablet Commonly known as:  IMDUR TAKE (1) TABLET TWICE DAILY.   lidocaine-prilocaine cream Commonly known as:  EMLA Apply 1 application topically as needed. For port access   multivitamin Tabs tablet Take 1 tablet by mouth daily.   nitroGLYCERIN 0.4 MG SL tablet Commonly known as:  NITROSTAT Place 1 tablet (0.4 mg total) under the tongue every 5 (five) minutes as needed for chest pain.   pantoprazole 40 MG tablet Commonly known as:  PROTONIX Take 1 tablet (40 mg total) by mouth 2 (two) times daily.   sevelamer carbonate 800 MG tablet Commonly known as:  RENVELA Take 2,400 mg by mouth 2 (two) times  daily.       Day of Discharge BP (!) 126/57 (BP Location: Right Wrist)   Pulse 86   Temp 97.6 F (36.4 C) (Oral)   Resp 17   Ht 5\' 8"  (1.727 m)   Wt 100.5 kg (221 lb 9 oz)   SpO2 93%   BMI 33.69 kg/m   Physical Exam: General: No acute respiratory distress - alert and oriented  Lungs: Poor air movement bilateral bases - no wheezing Cardiovascular: Regular rate and rhythm without murmur  Abdomen: Nontender, grossly protuberant, soft, bowel sounds positive, no rebound Extremities: No significant cyanosis or clubbing - 3+ pitting edema bilateral lower extremities  Basic Metabolic Panel:  Recent Labs Lab 06/09/16 0740 06/10/16 0448 06/11/16 1300 06/11/16 1900 06/12/16 1045 06/13/16  0415  NA 137 135 137  --  136 136  K 4.8 4.7 5.7*  --  4.6 4.2  CL 98* 98* 100*  --  99* 95*  CO2 26 22 25   --  25 27  GLUCOSE 117* 81 88  --  91 94  BUN 62* 69* 80*  --  56* 43*  CREATININE 12.28* 13.47* 14.57* 15.72* 12.47* 10.75*  CALCIUM 8.8* 8.6* 8.4*  --  8.6* 8.9  PHOS  --  8.1* 8.3*  --   --  6.1*    Liver Function Tests:  Recent Labs Lab 06/09/16 0740 06/10/16 0448 06/11/16 1300 06/12/16 1045 06/13/16 0415  AST 9*  --   --  10*  --   ALT 9*  --   --  6*  --   ALKPHOS 42  --   --  41  --   BILITOT 0.8  --   --  0.8  --   PROT 6.9  --   --  6.4*  --   ALBUMIN 3.5 3.3* 3.2* 3.2* 3.3*    Coags:  Recent Labs Lab 06/10/16 1545  INR 1.28    CBC:  Recent Labs Lab 06/09/16 0740 06/10/16 0448 06/11/16 1900 06/12/16 2004 06/13/16 0415  WBC 5.4 4.7 5.1 5.0 4.5  NEUTROABS 3.9  --   --   --   --   HGB 11.2* 10.8* 10.3* 10.7* 10.2*  HCT 35.2* 36.1* 33.5* 34.5* 33.7*  MCV 91.9 93.5 91.3 90.1 91.1  PLT 180 160 171 169 152    Cardiac Enzymes:  Recent Labs Lab 06/09/16 1419 06/09/16 1649 06/09/16 2017 06/10/16 0448 06/11/16 1300  TROPONINI 0.80* 1.14* 1.48* 1.91* 1.50*   BNP (last 3 results)  Recent Labs  06/09/16 0740  BNP 1,577.0*     CBG:  Recent Labs Lab 06/12/16 0808 06/12/16 1222 06/12/16 1641 06/12/16 2231 06/13/16 0757  GLUCAP 78 79 86 83 102*    Recent Results (from the past 240 hour(s))  MRSA PCR Screening     Status: None   Collection Time: 06/09/16  3:01 PM  Result Value Ref Range Status   MRSA by PCR NEGATIVE NEGATIVE Final    Comment:        The GeneXpert MRSA Assay (FDA approved for NASAL specimens only), is one component of a comprehensive MRSA colonization surveillance program. It is not intended to diagnose MRSA infection nor to guide or monitor treatment for MRSA infections.      Time spent in discharge (includes decision making & examination of pt): 35 minutes  06/13/2016, 10:18 AM   Lonia Blood, MD [facility] Office  [phone] Pager [phone]  On-Call/Text Page:      [email or web address]      password [email or web address]

## 2016-06-13 NOTE — Progress Notes (Signed)
Subjective: Interval History: has complaints wants to go home, does not like dialysis here.  Objective: Vital signs in last 24 hours: Temp:  [97.4 F (36.3 C)-98.2 F (36.8 C)] 97.6 F (36.4 C) (02/04 0700) Pulse Rate:  [80-88] 86 (02/04 0700) Resp:  [13-26] 17 (02/04 0700) BP: (123-179)/(57-88) 126/57 (02/04 0700) SpO2:  [92 %-99 %] 93 % (02/04 0700) Weight:  [100.5 kg (221 lb 9 oz)-102.2 kg (225 lb 5 oz)] 100.5 kg (221 lb 9 oz) (02/03 2130) Weight change: -1.5 kg (-3 lb 4.9 oz)  Intake/Output from previous day: 02/03 0701 - 02/04 0700 In: 360 [P.O.:360] Out: 1775 [Urine:75] Intake/Output this shift: Total I/O In: 240 [P.O.:240] Out: -   General appearance: alert, cooperative, no distress and moderately obese Resp: diminished breath sounds bilaterally and rales bibasilar Cardio: S1, S2 normal and systolic murmur: holosystolic 2/6, blowing at apex GI: obese, pos bs, liver down 6 cm Extremities: edema 3+ and AVF LUA  Lab Results:  Recent Labs  06/12/16 2004 06/13/16 0415  WBC 5.0 4.5  HGB 10.7* 10.2*  HCT 34.5* 33.7*  PLT 169 152   BMET:  Recent Labs  06/12/16 1045 06/13/16 0415  NA 136 136  K 4.6 4.2  CL 99* 95*  CO2 25 27  GLUCOSE 91 94  BUN 56* 43*  CREATININE 12.47* 10.75*  CALCIUM 8.6* 8.9   No results for input(s): PTH in the last 72 hours. Iron Studies: No results for input(s): IRON, TIBC, TRANSFERRIN, FERRITIN in the last 72 hours.  Studies/Results: No results found.  I have reviewed the patient's current medications.  Assessment/Plan: 1 ESRD vol xs , refuses to stay on HD 2 CHF vol from behavior 3 Anemia stable 4 CAD 5 HPTH 6 NONADHERENCE primary issue, will not do well until changes P D/c per primary HD per outpatient sched    LOS: 3 days   Otie Headlee L 06/13/2016,10:09 AM

## 2016-06-13 NOTE — Progress Notes (Signed)
Patient ID: [name], male   DOB: 09-09-1970, 46 y.o.   MRN: [id number]   SUBJECTIVE: No chest pain today, denies dyspnea.    LHC/RHC (2/2) with totally occluded RCA, R=> L collaterals, 55% prox to mid LAD.  Mean RA 27, PA 83/48 mean 63, mean PCWP 50.   Echo (1/18) with EF 25%, diffuse hypokinesis.   Scheduled Meds: . aspirin  81 mg Oral Daily  . atorvastatin  80 mg Oral q1800  . carvedilol  6.25 mg Oral BID WC  . docusate sodium  100 mg Oral BID  . enoxaparin (LOVENOX) injection  30 mg Subcutaneous Q24H  . isosorbide mononitrate  30 mg Oral Daily  . multivitamin  1 tablet Oral Daily  . pantoprazole  40 mg Oral BID  . sevelamer carbonate  4.8 g Oral TID WC   Continuous Infusions: PRN Meds:.acetaminophen, albuterol, colchicine, [DISCONTINUED] acetaminophen **AND** diphenhydrAMINE, morphine injection, ondansetron (ZOFRAN) IV  Vitals:   06/12/16 2350 06/13/16 0300 06/13/16 0347 06/13/16 0700  BP:  138/72  (!) 126/57  Pulse: 83 85 85 86  Resp: 13 16 (!) 26 17  Temp:  97.8 F (36.6 C)  97.6 F (36.4 C)  TempSrc:  Oral  Oral  SpO2: 99% 98% 95% 93%  Weight:      Height:        Intake/Output Summary (Last 24 hours) at 06/13/16 0951 Last data filed at 06/13/16 0900  Gross per 24 hour  Intake              600 ml  Output             1775 ml  Net            -1175 ml    LABS: Basic Metabolic Panel:  Recent Labs  54/98/26 1300  06/12/16 1045 06/13/16 0415  NA 137  --  136 136  K 5.7*  --  4.6 4.2  CL 100*  --  99* 95*  CO2 25  --  25 27  GLUCOSE 88  --  91 94  BUN 80*  --  56* 43*  CREATININE 14.57*  < > 12.47* 10.75*  CALCIUM 8.4*  --  8.6* 8.9  PHOS 8.3*  --   --  6.1*  < > = values in this interval not displayed. Liver Function Tests:  Recent Labs  06/12/16 1045 06/13/16 0415  AST 10*  --   ALT 6*  --   ALKPHOS 41  --   BILITOT 0.8  --   PROT 6.4*  --   ALBUMIN 3.2* 3.3*   No results for input(s): LIPASE, AMYLASE in the last 72 hours. CBC:  Recent  Labs  06/12/16 2004 06/13/16 0415  WBC 5.0 4.5  HGB 10.7* 10.2*  HCT 34.5* 33.7*  MCV 90.1 91.1  PLT 169 152   Cardiac Enzymes:  Recent Labs  06/11/16 1300  TROPONINI 1.50*   BNP: Invalid input(s): POCBNP D-Dimer: No results for input(s): DDIMER in the last 72 hours. Hemoglobin A1C: No results for input(s): HGBA1C in the last 72 hours. Fasting Lipid Panel: No results for input(s): CHOL, HDL, LDLCALC, TRIG, CHOLHDL, LDLDIRECT in the last 72 hours. Thyroid Function Tests: No results for input(s): TSH, T4TOTAL, T3FREE, THYROIDAB in the last 72 hours.  Invalid input(s): FREET3 Anemia Panel: No results for input(s): VITAMINB12, FOLATE, FERRITIN, TIBC, IRON, RETICCTPCT in the last 72 hours.  RADIOLOGY: Dg Chest 2 View  Result Date: 06/09/2016 CLINICAL DATA:  Left-sided  chest pain for 1 day EXAM: CHEST  2 VIEW COMPARISON:  01/01/2016 FINDINGS: Cardiac shadow remains enlarged. The lungs are well aerated bilaterally. No focal infiltrate or sizable effusion is seen. No bony abnormality is noted. Previously seen bilateral infiltrates have cleared in the interval. IMPRESSION: No active cardiopulmonary disease. Electronically Signed   By: Alcide Clever M.D.   On: 06/09/2016 08:20    PHYSICAL EXAM General: NAD Neck: JVP 14 cm, no thyromegaly or thyroid nodule.  Lungs: Crackles at bases.  CV: Nondisplaced PMI.  Heart regular S1/S2, no S3/S4, no murmur.  1+ edema to knees bilaterally.   Abdomen: Soft, nontender, no hepatosplenomegaly, mild distention.  Neurologic: Alert and oriented x 3.  Psych: Normal affect. Extremities: No clubbing or cyanosis.   TELEMETRY: Reviewed telemetry pt in NSR  ASSESSMENT AND PLAN: 46 yo with history of PAD s/p right fem-pop, ESRD, DM, smoking, HTN was found to have NSTEMI with EF down to 25%.   1. CAD: NSTEMI, peak troponin 1.91.  LHC 2/2 showed chronically occluded RCA with collaterals and long 55% proximal to mid LAD stenosis.  No intervention.  No  chest pain.  - Continue ASA 81 and atorvastatin 80.  2. Acute on chronic systolic CHF: EF 16%.  Suspect primarily ischemic cardiomyopathy though EF is lower than I would expect with just RCA occlusion. RHC suggested marked volume overload, exam today also suggests marked volume overload.  Lack of significant symptoms appears to suggest that this is a chronic condition.  HD yesterday, weight down 4 lbs.  Needs a lot more off but not able to stay on HD long due to cramping.  - Would ideally have HD again today for fluid removal.  - Continue Coreg.  Started low dose lisinopril yesterday but nephrology stopped, ?due to low BP with HD but does not have a history of this.   3. ESRD: Needs ongoing HD for volume removal.  4. PAD: Stable, ASA + statin.  5. 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   I don't have much more to add at this point, main issue is marked volume overload.  Resolution appears to be limited by patient's intolerance of dialysis due to cramping.  He can followup in our office after discharge.   Marca Ancona 06/13/2016 9:51 AM

## 2016-06-14 ENCOUNTER — Encounter (HOSPITAL_COMMUNITY): Payer: Self-pay | Admitting: Cardiovascular Disease

## 2016-06-16 ENCOUNTER — Other Ambulatory Visit: Payer: Self-pay | Admitting: *Deleted

## 2016-06-16 DIAGNOSIS — I739 Peripheral vascular disease, unspecified: Secondary | ICD-10-CM

## 2016-06-17 ENCOUNTER — Ambulatory Visit (INDEPENDENT_AMBULATORY_CARE_PROVIDER_SITE_OTHER)
Admission: RE | Admit: 2016-06-17 | Discharge: 2016-06-17 | Disposition: A | Discharge status: Home / Self Care | Payer: BLUE CROSS/BLUE SHIELD | Source: Ambulatory Visit | Attending: Vascular Surgery | Admitting: Vascular Surgery

## 2016-06-17 ENCOUNTER — Ambulatory Visit (HOSPITAL_COMMUNITY)
Admission: RE | Admit: 2016-06-17 | Discharge: 2016-06-17 | Disposition: A | Discharge status: Home / Self Care | Payer: BLUE CROSS/BLUE SHIELD | Source: Ambulatory Visit | Attending: Vascular Surgery | Admitting: Vascular Surgery

## 2016-06-17 ENCOUNTER — Encounter: Payer: Self-pay | Admitting: Vascular Surgery

## 2016-06-17 ENCOUNTER — Ambulatory Visit: Payer: Medicare Other | Admitting: Vascular Surgery

## 2016-06-17 DIAGNOSIS — I10 Essential (primary) hypertension: Secondary | ICD-10-CM | POA: Insufficient documentation

## 2016-06-17 DIAGNOSIS — R938 Abnormal findings on diagnostic imaging of other specified body structures: Secondary | ICD-10-CM | POA: Diagnosis not present

## 2016-06-17 DIAGNOSIS — Z87891 Personal history of nicotine dependence: Secondary | ICD-10-CM | POA: Insufficient documentation

## 2016-06-17 DIAGNOSIS — I739 Peripheral vascular disease, unspecified: Secondary | ICD-10-CM

## 2016-06-17 DIAGNOSIS — E1151 Type 2 diabetes mellitus with diabetic peripheral angiopathy without gangrene: Secondary | ICD-10-CM | POA: Diagnosis not present

## 2016-06-17 DIAGNOSIS — E785 Hyperlipidemia, unspecified: Secondary | ICD-10-CM | POA: Diagnosis not present

## 2016-06-17 DIAGNOSIS — R0989 Other specified symptoms and signs involving the circulatory and respiratory systems: Secondary | ICD-10-CM | POA: Diagnosis present

## 2016-06-17 LAB — VAS US LOWER EXTREMITY BYPASS GRAFT DUPLEX
RATIBDISTSYS: 31 cm/s
RSFDPSV: -52 cm/s
RSFMPSV: -78 cm/s
RTIBDISTSYS: 101 cm/s
Right popliteal dist sys PSV: 76 cm/s
Right super femoral prox sys PSV: 81 cm/s

## 2016-06-18 ENCOUNTER — Ambulatory Visit (INDEPENDENT_AMBULATORY_CARE_PROVIDER_SITE_OTHER): Payer: BLUE CROSS/BLUE SHIELD | Admitting: Adult Health

## 2016-06-18 ENCOUNTER — Encounter: Payer: Self-pay | Admitting: Adult Health

## 2016-06-18 VITALS — BP 140/78 | HR 83 | Ht 68.0 in | Wt 214.0 lb

## 2016-06-18 DIAGNOSIS — I2510 Atherosclerotic heart disease of native coronary artery without angina pectoris: Secondary | ICD-10-CM | POA: Diagnosis not present

## 2016-06-18 DIAGNOSIS — I5022 Chronic systolic (congestive) heart failure: Secondary | ICD-10-CM | POA: Diagnosis not present

## 2016-06-18 DIAGNOSIS — I10 Essential (primary) hypertension: Secondary | ICD-10-CM

## 2016-06-18 MED ORDER — CARVEDILOL 6.25 MG PO TABS: 9.3750 mg | ORAL_TABLET | Freq: Two times a day (BID) | ORAL | 3 refills | Status: AC

## 2016-06-18 NOTE — Patient Instructions (Signed)
Your physician recommends that you schedule a follow-up appointment in: 1 month   INCREASE Coreg to 9.375 mg ( 1 1/2 pills of your 6.25 mg pill)   twice a day       Thank you for choosing [facility] !

## 2016-06-18 NOTE — Progress Notes (Signed)
Name: [name]    DOB: March 21, 1971  Age: 46 y.o.  MR#: [id number]       PCP:  Louie Boston, MD      Insurance: Payor: MEDICARE / Plan: MEDICARE PART A / Product Type: *No Product type* /   CC:   No chief complaint on file.   VS Vitals:   06/18/16 1505  Weight: 214 lb (97.1 kg)  Height: 5\' 8"  (1.727 m)    Weights Current Weight  06/18/16 214 lb (97.1 kg)  06/12/16 221 lb 9 oz (100.5 kg)  01/03/16 225 lb 4.8 oz (102.2 kg)    Blood Pressure  BP Readings from Last 3 Encounters:  06/13/16 129/64  01/03/16 (!) 148/77  06/17/15 (!) 153/83     Admit date:  (Not on file) Last encounter with RMR:  Visit date not found   Allergy Vytorin [ezetimibe-simvastatin]; Gabapentin; and Oxycodone  Current Outpatient Prescriptions  Medication Sig Dispense Refill  . albuterol (PROVENTIL HFA;VENTOLIN HFA) 108 (90 BASE) MCG/ACT inhaler Inhale 2 puffs into the lungs every 6 (six) hours as needed for wheezing or shortness of breath. 1 Inhaler 2  . aspirin 81 MG chewable tablet Chew 81 mg by mouth daily.    Marland Kitchen atorvastatin (LIPITOR) 80 MG tablet Take 1 tablet (80 mg total) by mouth daily at 6 PM. 30 tablet 0  . carvedilol (COREG) 6.25 MG tablet Take 1 tablet (6.25 mg total) by mouth 2 (two) times daily with a meal. 60 tablet 0  . colchicine 0.6 MG tablet Take 0.6 mg by mouth 2 (two) times daily as needed (gout).     Marland Kitchen diphenhydramine-acetaminophen (TYLENOL PM) 25-500 MG TABS Take 1-2 tablets by mouth at bedtime as needed (for pain and sleep).    . docusate sodium (COLACE) 100 MG capsule Take 3 mg by mouth 2 (two) times daily as needed for mild constipation.     . isosorbide mononitrate (IMDUR) 30 MG 24 hr tablet TAKE (1) TABLET TWICE DAILY. 60 tablet 3  . lidocaine-prilocaine (EMLA) cream Apply 1 application topically as needed. For port access  10  . multivitamin (RENA-VIT) TABS tablet Take 1 tablet by mouth daily.    . nitroGLYCERIN (NITROSTAT) 0.4 MG SL tablet Place 1 tablet (0.4 mg total) under  the tongue every 5 (five) minutes as needed for chest pain. 25 tablet 0  . pantoprazole (PROTONIX) 40 MG tablet Take 1 tablet (40 mg total) by mouth 2 (two) times daily. 60 tablet 2  . sevelamer carbonate (RENVELA) 800 MG tablet Take 2,400 mg by mouth 2 (two) times daily.  4   No current facility-administered medications for this visit.     Discontinued Meds:   There are no discontinued medications.  Patient Active Problem List   Diagnosis Date Noted  . Acute on chronic combined systolic and diastolic CHF (congestive heart failure) (HCC)   . Absolute anemia   . Upper GI bleed 01/01/2016  . SOB (shortness of breath) 01/01/2016  . End stage renal disease (HCC) 11/01/2013  . Pain in limb-Right groin 09/06/2013  . Discoloration of skin of toe-Right great toe 09/06/2013  . Wound drainage-Right groin-Right Thigh 09/06/2013  . Blood in stool- Today 09/06/2013  . Acute on chronic renal failure (HCC) 07/09/2013  . Noninfectious gastroenteritis and colitis 07/09/2013  . PVD (peripheral vascular disease) (HCC) 06/28/2013  . Ischemic toe 05/31/2013  . Ischemic cardiomyopathy   . Coronary atherosclerosis of native coronary artery 07/12/2012  . Non-ST elevation (NSTEMI) myocardial infarction (  HCC) 03/28/2012  . Hyperlipidemia 03/28/2012  . Type 2 diabetes mellitus with diabetic chronic kidney disease (HCC) 03/28/2012  . PAD (peripheral artery disease) (HCC) 03/28/2012  . Tobacco abuse 03/28/2012  . Essential hypertension, benign 03/28/2012    LABS    Component Value Date/Time   NA 136 06/13/2016 0415   NA 136 06/12/2016 1045   NA 137 06/11/2016 1300   K 4.2 06/13/2016 0415   K 4.6 06/12/2016 1045   K 5.7 (H) 06/11/2016 1300   CL 95 (L) 06/13/2016 0415   CL 99 (L) 06/12/2016 1045   CL 100 (L) 06/11/2016 1300   CO2 27 06/13/2016 0415   CO2 25 06/12/2016 1045   CO2 25 06/11/2016 1300   GLUCOSE 94 06/13/2016 0415   GLUCOSE 91 06/12/2016 1045   GLUCOSE 88 06/11/2016 1300   BUN 43  (H) 06/13/2016 0415   BUN 56 (H) 06/12/2016 1045   BUN 80 (H) 06/11/2016 1300   CREATININE 10.75 (H) 06/13/2016 0415   CREATININE 12.47 (H) 06/12/2016 1045   CREATININE 15.72 (H) 06/11/2016 1900   CALCIUM 8.9 06/13/2016 0415   CALCIUM 8.6 (L) 06/12/2016 1045   CALCIUM 8.4 (L) 06/11/2016 1300   GFRNONAA 5 (L) 06/13/2016 0415   GFRNONAA 4 (L) 06/12/2016 1045   GFRNONAA 3 (L) 06/11/2016 1900   GFRAA 6 (L) 06/13/2016 0415   GFRAA 5 (L) 06/12/2016 1045   GFRAA 4 (L) 06/11/2016 1900   CMP     Component Value Date/Time   NA 136 06/13/2016 0415   K 4.2 06/13/2016 0415   CL 95 (L) 06/13/2016 0415   CO2 27 06/13/2016 0415   GLUCOSE 94 06/13/2016 0415   BUN 43 (H) 06/13/2016 0415   CREATININE 10.75 (H) 06/13/2016 0415   CALCIUM 8.9 06/13/2016 0415   PROT 6.4 (L) 06/12/2016 1045   ALBUMIN 3.3 (L) 06/13/2016 0415   AST 10 (L) 06/12/2016 1045   ALT 6 (L) 06/12/2016 1045   ALKPHOS 41 06/12/2016 1045   BILITOT 0.8 06/12/2016 1045   GFRNONAA 5 (L) 06/13/2016 0415   GFRAA 6 (L) 06/13/2016 0415       Component Value Date/Time   WBC 4.5 06/13/2016 0415   WBC 5.0 06/12/2016 2004   WBC 5.1 06/11/2016 1900   HGB 10.2 (L) 06/13/2016 0415   HGB 10.7 (L) 06/12/2016 2004   HGB 10.3 (L) 06/11/2016 1900   HCT 33.7 (L) 06/13/2016 0415   HCT 34.5 (L) 06/12/2016 2004   HCT 33.5 (L) 06/11/2016 1900   MCV 91.1 06/13/2016 0415   MCV 90.1 06/12/2016 2004   MCV 91.3 06/11/2016 1900    Lipid Panel     Component Value Date/Time   CHOL 84 06/10/2016 0448   TRIG 78 06/10/2016 0448   HDL 28 (L) 06/10/2016 0448   CHOLHDL 3.0 06/10/2016 0448   VLDL 16 06/10/2016 0448   LDLCALC 40 06/10/2016 0448    ABG    Component Value Date/Time   PHART 7.339 (L) 06/11/2016 1702   PCO2ART 45.3 06/11/2016 1702   PO2ART 87.0 06/11/2016 1702   HCO3 24.7 06/11/2016 1711   TCO2 26 06/11/2016 1711   ACIDBASEDEF 2.0 06/11/2016 1711   O2SAT 70.0 06/11/2016 1711     Lab Results  Component Value Date   TSH  3.430 10/01/2010   BNP (last 3 results)  Recent Labs  06/09/16 0740  BNP 1,577.0*    ProBNP (last 3 results) No results for input(s): PROBNP in the last 8760 hours.  Cardiac Panel (last 3 results) No results for input(s): CKTOTAL, CKMB, TROPONINI, RELINDX in the last 72 hours.  Iron/TIBC/Ferritin/ %Sat    Component Value Date/Time   IRON 25 (L) 01/02/2016 1714   TIBC 304 01/02/2016 1714   FERRITIN 512 (H) 01/02/2016 1714   IRONPCTSAT 8 (L) 01/02/2016 1714     EKG Orders placed or performed during the hospital encounter of 06/09/16  . EKG 12-Lead  . EKG 12-Lead  . EKG  . EKG 12-Lead  . EKG 12-Lead     Prior Assessment and Plan Problem List as of 06/18/2016 Reviewed: 06/13/2016 10:11 AM by Lonia Blood, MD     Cardiovascular and Mediastinum   Non-ST elevation (NSTEMI) myocardial infarction [facility])   Last Assessment & Plan 04/05/2012 Office Visit Written 04/05/2012  2:17 PM by Rande Brunt, PA    Patient advised to gradually increase his exercise tolerance. He is cleared to return to work next week, and per his request. We will reassess his clinical status in 3 months, with Dr. Diona Browner.      PAD (peripheral artery disease) (HCC)   Essential hypertension, benign   Last Assessment & Plan 10/23/2012 Office Visit Written 10/23/2012 11:18 AM by Rande Brunt, PA-C     To be reassessed following increase in carvedilol dose      Coronary atherosclerosis of native coronary artery   Last Assessment & Plan 10/23/2012 Office Visit Edited 10/23/2012 11:22 AM by Rande Brunt, PA-C    Continued aggressive medical management and risk factor modification recommended, including complete smoking cessation. Patient now reports compliance with his medications, including resumption of Plavix. Will decrease Imdur to 15 mg daily, following development of HA. Of note, patient is to remain on DAPT through 03/2013. He has been cleared to resume working, without restriction.      Ischemic  cardiomyopathy   Last Assessment & Plan 10/23/2012 Office Visit Written 10/23/2012 11:15 AM by Rande Brunt, PA-C    Will increase carvedilol to 18.75 twice a day and arrange an RN visit in 2 weeks to reassess BP/HR. If stable, continue further up titration to target dose 25 twice a day.      Ischemic toe   PVD (peripheral vascular disease) (HCC)   Acute on chronic combined systolic and diastolic CHF (congestive heart failure) (HCC)     Digestive   Noninfectious gastroenteritis and colitis   Blood in stool- Today   Upper GI bleed     Endocrine   Type 2 diabetes mellitus with diabetic chronic kidney disease (HCC)     Musculoskeletal and Integument   Discoloration of skin of toe-Right great toe     Genitourinary   Acute on chronic renal failure (HCC)   End stage renal disease (HCC)     Other   Hyperlipidemia   Last Assessment & Plan 10/23/2012 Office Visit Written 10/23/2012 11:17 AM by Rande Brunt, PA-C    Patient reportedly was taken off Lipitor recently, by Dr. Kristian Covey. I instructed him to further clarify this with him at time of scheduled followup within the next several weeks. Meanwhile, we'll increase omega-3 fish oil to 2 g twice a day and reassess lipid status in 12 weeks. Of note, I would try to maintain him on a low-dose statin, while targeting his hypertriglyceridemia, if possible.      Tobacco abuse   Last Assessment & Plan 10/23/2012 Office Visit Written 10/23/2012 11:15 AM by Rande Brunt, PA-C    Patient strongly advised  to stop smoking completely      Pain in limb-Right groin   Wound drainage-Right groin-Right Thigh   SOB (shortness of breath)   Absolute anemia       Imaging: Dg Chest 2 View  Result Date: 06/09/2016 CLINICAL DATA:  Left-sided chest pain for 1 day EXAM: CHEST  2 VIEW COMPARISON:  01/01/2016 FINDINGS: Cardiac shadow remains enlarged. The lungs are well aerated bilaterally. No focal infiltrate or sizable effusion is seen. No bony abnormality  is noted. Previously seen bilateral infiltrates have cleared in the interval. IMPRESSION: No active cardiopulmonary disease. Electronically Signed   By: Alcide Clever M.D.   On: 06/09/2016 08:20

## 2016-06-18 NOTE — Progress Notes (Signed)
Cardiology Office Note   Date:  06/18/2016   ID:  HAMILTON MARINELLO, DOB 11-23-1970, MRN [id number]  PCP:  Louie Boston, MD  Cardiologist: Inis Sizer, NP   No chief complaint on file.     History of Present Illness: [name] is a 46 y.o. male who presents for posthospitalization follow-up after admission for non-ST elevation MI, with echocardiogram revealing a reduced LVEF of 25%. The patient also has end-stage renal disease and requires dialysis. The patient was sent for cardiac catheterization, to be timed with dialysis.  Cardiac catheterization revealed chronically occluded right coronary artery with collaterals and a long 55% proximal to mid LAD stenosis. This did not require intervention. He was to continue aspirin and atorvastatin. His reduced ejection fraction was suspected to be primarily ischemic cardiomyopathy although EF is lower than expected with this in RCA occlusion. Right heart catheterization  marked volume overload on exam with pulmonary hypertension. Hemodialysis was completed post catheterization. For fluid volume control. Carvedilol was increased to 6.25 mg twice a day.  He comes today feeling much better. His dry weight is now 97.1 kg or 214 pounds, but he states that his dry weight should be closer to 210 pounds. He has a renewed sense of urgency to take better care of himself. He is going to dialysis as directed, taking his medications as directed, and is adhering to a low sodium diet. He denies any chest pain rapid heart rhythm dizziness.Marland Kitchen He occasionally does have some fatigue after dialysis. He also complaints of cramping throughout dialysis.   Past Medical History:  Diagnosis Date  . Anemia   . Anginal pain (HCC)   . CHF (congestive heart failure) (HCC)   . CKD (chronic kidney disease) stage 3, GFR 30-59 ml/min    on dialysis M/W/F - started March 2016  . Coronary atherosclerosis of native coronary artery    100% apical LAD and distal OM1, Rx  medically - 11/13  . Depression   . Essential hypertension, benign   . Gouty arthritis   . History of blood transfusion 1988   "arm went thru glass window" (10/12/2012)  . Ischemic cardiomyopathy    LVEF 40-45%  . Mixed hyperlipidemia   . NSTEMI (non-ST elevated myocardial infarction) (HCC) 02/2012 and 10/2012  . PAD (peripheral artery disease) (HCC)    Normal ABIs, 2011; focal left EIA stenosis; mild bilateral distal SFA disease, 2008  . RBBB   . Shortness of breath    when he has fluid he gets sob  . Type 2 diabetes mellitus (HCC)    diet controlled    Past Surgical History:  Procedure Laterality Date  . Arm surgery Right [phone]   "@ least 3 ORs; took nerves out of my left leg and put them in my arm" (10/12/2012)  . AV FISTULA PLACEMENT Left 10/16/2013   Procedure: CREATION OF LEFT ARM BASILIC TO BRACHIAL FISTULA;  Surgeon: Sherren Kerns, MD;  Location: [facility] OR;  Service: Vascular;  Laterality: Left;  . BASCILIC VEIN TRANSPOSITION Left 08/13/2014   Procedure: Left arm SECOND STAGE BASILIC VEIN TRANSPOSITION;  Surgeon: Sherren Kerns, MD;  Location: [facility] OR;  Service: Vascular;  Laterality: Left;  . CARDIAC CATHETERIZATION  03/28/12   20% prox and mid LAD, 100% distal LAD, 30% prox OM, 100% distal OM, 40% prox RCA, 40% mid RCA, 25% distal RCA, small PDA w/ 80% diffuse dz, PLB small w/ 50% diffuse stenoses. No focal lesions for PCI. Recommendations for continued medical  management and aggressive RF reduction  . CARDIAC CATHETERIZATION N/A 06/11/2016   Procedure: Right/Left Heart Cath and Coronary Angiography;  Surgeon: Lennette Bihari, MD;  Location: [facility] INVASIVE CV LAB;  Service: Cardiovascular;  Laterality: N/A;  . COLONOSCOPY  2015   Brian Beacheam: incomplete colonoscopy due to inadequate prep, inflammation found sigmoid colon to splenic flexure/distal transverse colon. Ischemic colitis vs infectious  . ENDARTERECTOMY FEMORAL Right 07/17/2013   Procedure: ENDARTERECTOMY FEMORAL WITH  PROFUNDAPLOASTY;  Surgeon: Sherren Kerns, MD;  Location: [facility] OR;  Service: Vascular;  Laterality: Right;  . ESOPHAGOGASTRODUODENOSCOPY (EGD) WITH PROPOFOL N/A 01/02/2016   Procedure: ESOPHAGOGASTRODUODENOSCOPY (EGD) WITH PROPOFOL;  Surgeon: Corbin Ade, MD;  Location: [facility] ENDO SUITE;  Service: Endoscopy;  Laterality: N/A;  . EYE SURGERY Left 1990's   "blood vessel ruptured" (10/12/2012)  . FEMORAL-POPLITEAL BYPASS GRAFT Right 07/17/2013   Procedure: BYPASS GRAFT FEMORAL-POPLITEAL ARTERY-RIGHT;  Surgeon: Sherren Kerns, MD;  Location: [facility] OR;  Service: Vascular;  Laterality: Right;  . LEFT HEART CATHETERIZATION WITH CORONARY ANGIOGRAM N/A 03/28/2012   Procedure: LEFT HEART CATHETERIZATION WITH CORONARY ANGIOGRAM;  Surgeon: Kathleene Hazel, MD;  Location: [facility] CATH LAB;  Service: Cardiovascular;  Laterality: N/A;  . LOWER EXTREMITY ANGIOGRAM Right 07/11/2013   Procedure: LOWER EXTREMITY ANGIOGRAM;  Surgeon: Fransisco Hertz, MD;  Location: [facility] CATH LAB;  Service: Cardiovascular;  Laterality: Right;  rt leg angio  . PATCH ANGIOPLASTY Right 07/17/2013   Procedure: PATCH ANGIOPLASTY OF COMMON FEMORAL AND PROFUNDA USING SEGMENT OF GREATER SAPHENOUS VEIN ;  Surgeon: Sherren Kerns, MD;  Location: [facility] OR;  Service: Vascular;  Laterality: Right;  . PERIPHERAL VASCULAR CATHETERIZATION N/A 02/28/2015   Procedure: Fistulagram;  Surgeon: Sherren Kerns, MD;  Location: [facility] INVASIVE CV LAB;  Service: Cardiovascular;  Laterality: N/A;  . PERIPHERAL VASCULAR CATHETERIZATION Left 02/28/2015   Procedure: A/V Shunt Intervention;  Surgeon: Sherren Kerns, MD;  Location: [facility] INVASIVE CV LAB;  Service: Cardiovascular;  Laterality: Left;  . REFRACTIVE SURGERY Bilateral ~ 2005     Current Outpatient Prescriptions  Medication Sig Dispense Refill  . albuterol (PROVENTIL HFA;VENTOLIN HFA) 108 (90 BASE) MCG/ACT inhaler Inhale 2 puffs into the lungs every 6 (six) hours as needed for wheezing or shortness of breath. 1  Inhaler 2  . aspirin 81 MG chewable tablet Chew 81 mg by mouth daily.    Marland Kitchen atorvastatin (LIPITOR) 80 MG tablet Take 1 tablet (80 mg total) by mouth daily at 6 PM. 30 tablet 0  . colchicine 0.6 MG tablet Take 0.6 mg by mouth 2 (two) times daily as needed (gout).     Marland Kitchen diphenhydramine-acetaminophen (TYLENOL PM) 25-500 MG TABS Take 1-2 tablets by mouth at bedtime as needed (for pain and sleep).    . docusate sodium (COLACE) 100 MG capsule Take 3 mg by mouth 2 (two) times daily as needed for mild constipation.     . isosorbide mononitrate (IMDUR) 30 MG 24 hr tablet TAKE (1) TABLET TWICE DAILY. 60 tablet 3  . lidocaine-prilocaine (EMLA) cream Apply 1 application topically as needed. For port access  10  . multivitamin (RENA-VIT) TABS tablet Take 1 tablet by mouth daily.    . nitroGLYCERIN (NITROSTAT) 0.4 MG SL tablet Place 1 tablet (0.4 mg total) under the tongue every 5 (five) minutes as needed for chest pain. 25 tablet 0  . pantoprazole (PROTONIX) 40 MG tablet Take 1 tablet (40 mg total) by mouth 2 (two) times daily. 60 tablet 2  . sevelamer carbonate (  RENVELA) 800 MG tablet Take 2,400 mg by mouth 2 (two) times daily.  4  . carvedilol (COREG) 6.25 MG tablet Take 1.5 tablets (9.375 mg total) by mouth 2 (two) times daily. 270 tablet 3   No current facility-administered medications for this visit.     Allergies:   Vytorin [ezetimibe-simvastatin]; Gabapentin; and Oxycodone    Social History:  The patient  reports that he quit smoking about 2 years ago. His smoking use included Cigarettes. He started smoking about 34 years ago. He has a 12.50 pack-year smoking history. He has never used smokeless tobacco. He reports that he uses drugs, including Cocaine, "Crack" cocaine, and Marijuana. He reports that he does not drink alcohol.   Family History:  The patient's family history includes Cancer in his mother; Heart attack (age of onset: 75) in his father; Hypertension in his father and mother; Lupus in his  mother.    ROS: All other systems are reviewed and negative. Unless otherwise mentioned in H&P    PHYSICAL EXAM: VS:  BP 140/78   Pulse 83   Ht 5\' 8"  (1.727 m)   Wt 214 lb (97.1 kg)   SpO2 97%   BMI 32.54 kg/m  , BMI Body mass index is 32.54 kg/m. GEN: Well nourished, well developed, in no acute distress  HEENT: normal  Neck: no JVD, carotid bruits, or masses Cardiac: RRR; tachycardic, no murmurs, rubs, or gallops, 2+ pitting pretibial edema in the dependent position. Respiratory:  Clear to auscultation bilaterally, normal work of breathing GI: soft, nontender, nondistended, + BS MS: no deformity or atrophy dialysis shunt in the upper left arm with new dressing applied. Good palpable thrill. No pain at site Skin: warm and dry, no rash Neuro:  Strength and sensation are intact Psych: euthymic mood, full affect   Recent Labs: 06/09/2016: B Natriuretic Peptide 1,577.0 06/12/2016: ALT 6 06/13/2016: BUN 43; Creatinine, Ser 10.75; Hemoglobin 10.2; Platelets 152; Potassium 4.2; Sodium 136    Lipid Panel    Component Value Date/Time   CHOL 84 06/10/2016 0448   TRIG 78 06/10/2016 0448   HDL 28 (L) 06/10/2016 0448   CHOLHDL 3.0 06/10/2016 0448   VLDL 16 06/10/2016 0448   LDLCALC 40 06/10/2016 0448      Wt Readings from Last 3 Encounters:  06/18/16 214 lb (97.1 kg)  06/12/16 221 lb 9 oz (100.5 kg)  01/03/16 225 lb 4.8 oz (102.2 kg)      Other studies Reviewed: Echocardiogram 1\61\0960 Left ventricle: The cavity size was moderately to severely   dilated. Wall thickness was increased in a pattern of mild LVH.   Systolic function was severely reduced. The estimated ejection   fraction was 25%. Diffuse hypokinesis. Features are consistent   with a pseudonormal left ventricular filling pattern, with   concomitant abnormal relaxation and increased filling pressure   (grade 2 diastolic dysfunction). Doppler parameters are   consistent with high ventricular filling pressure. -  Aortic valve: Mildly to moderately calcified annulus. Trileaflet. - Mitral valve: Mildly thickened leaflets . There was mild   regurgitation. - Left atrium: The atrium was mildly dilated. - Right ventricle: Systolic function was moderately reduced. - Right atrium: The atrium was mildly dilated. - Tricuspid valve: There was moderate-severe regurgitation. - Pulmonary arteries: PA peak pressure: 48 mm Hg (S). - Systemic veins: IVC was dilated with normal respiratory   variation. Estimated CVP 8 mmHg. - Pericardium, extracardiac: A small pericardial effusion was   identified.  ASSESSMENT AND PLAN:  1. Chronic systolic heart failure: Fluid management per dialysis. EF significantly reduced. Per recent catheterization at [facility], it was felt to be ischemic. I will increase his carvedilol to 9.375 mg twice a day as his heart rate is not optimally controlled for reduced EF. He will continue aspirin daily. Blood pressure is elevated with some fluid retention. May consider adding ACE inhibitor once he reaches dry weight. He will be seen on a monthly basis for close follow-up. We'll continue isosorbide 30 mg twice a day.  He will need repeat echocardiogram in 3 months to evaluate for improvement in systolic function after ongoing dialysis and fluid management.  2. Coronary artery disease: As discussed above, single vessel coronary artery disease of the right coronary artery. The patient did not require intervention. He will continue secondary prevention with statin therapy, beta blocker, aspirin, and blood pressure control.   3. End-stage renal disease: Having dialysis only 2 hours at a time as he is unable to tolerate longer due to significant cramping. We'll defer to nephrology for fluid status management.  4. Hypercholesterolemia: Continue statin therapy. We will follow-up labs in 3 months.   Current medicines are reviewed at length with the patient today.    Labs/ tests ordered today include:  No  orders of the defined types were placed in this encounter.    Disposition:   FU with one months. Signed, Ricky Reining, NP  06/18/2016 4:13 PM    [facility] [address] [place], [place] [address] Phone: [phone]; Fax: [phone]

## 2016-06-21 ENCOUNTER — Other Ambulatory Visit: Payer: Self-pay | Admitting: Adult Health

## 2016-06-21 MED ORDER — PANTOPRAZOLE SODIUM 40 MG PO TBEC
40.0000 mg | DELAYED_RELEASE_TABLET | Freq: Two times a day (BID) | ORAL | 2 refills | Status: AC
Start: 2016-06-21 — End: ?

## 2016-06-21 NOTE — Telephone Encounter (Signed)
Refilled pantoprazole , pt with check with pcp regarding continuation of medication prescribed by hospitalist.

## 2016-06-22 ENCOUNTER — Ambulatory Visit: Payer: Medicare Other | Admitting: Gastroenterology

## 2016-06-22 ENCOUNTER — Encounter: Payer: Self-pay | Admitting: Gastroenterology

## 2016-06-22 ENCOUNTER — Telehealth: Payer: Self-pay | Admitting: Gastroenterology

## 2016-06-22 NOTE — Telephone Encounter (Signed)
PT WAS A NO SHOW AND LETTER SENT  °

## 2016-06-24 ENCOUNTER — Encounter: Payer: Self-pay | Admitting: Family

## 2016-06-24 ENCOUNTER — Other Ambulatory Visit: Payer: Self-pay | Admitting: Cardiology

## 2016-06-24 ENCOUNTER — Telehealth: Payer: Self-pay | Admitting: Cardiology

## 2016-06-24 ENCOUNTER — Ambulatory Visit (INDEPENDENT_AMBULATORY_CARE_PROVIDER_SITE_OTHER): Payer: BLUE CROSS/BLUE SHIELD | Admitting: Family

## 2016-06-24 VITALS — BP 134/80 | HR 86 | Temp 98.0°F | Resp 16 | Ht 68.0 in | Wt 214.0 lb

## 2016-06-24 DIAGNOSIS — I779 Disorder of arteries and arterioles, unspecified: Secondary | ICD-10-CM

## 2016-06-24 DIAGNOSIS — N186 End stage renal disease: Secondary | ICD-10-CM | POA: Diagnosis not present

## 2016-06-24 DIAGNOSIS — Z87891 Personal history of nicotine dependence: Secondary | ICD-10-CM

## 2016-06-24 DIAGNOSIS — Z95828 Presence of other vascular implants and grafts: Secondary | ICD-10-CM

## 2016-06-24 DIAGNOSIS — Z992 Dependence on renal dialysis: Secondary | ICD-10-CM

## 2016-06-24 NOTE — Patient Instructions (Signed)

## 2016-06-24 NOTE — Telephone Encounter (Signed)
[name] called the office today stating that he continues to have chest pains all week. He requested an appointment. Scheduled with K.Lawrence on 06/29/16. Patient will go to the ER if pains continue.

## 2016-06-24 NOTE — Progress Notes (Signed)
[facility] [place]   CC: Follow up peripheral artery occlusive disease  History of Present Illness Tevis Dunavan Grandfield is a 46 y.o. malepatient of Dr. Darrick Penna who is s/p second stage basilic vein transposition fistula on 08/13/2014.  He dialyzes M-W-T-F, 4 days/week due to severe cramping with 4 hours HD, able to tolerate 2 hours sessions, using left upper arm AVF.  Pt states several family members have the same type of overall body cramps.    He previously had a right femoral to below-knee popliteal bypass with propaten March 2015.   He denies any symptoms of steal from his fistula. He has not had any problems with nonhealing wounds.   Dr. Darrick Penna last evaluated pt on 06-12-15. At that time the patient wished to try a course of Neurontin for his current symptoms. Dr. Darrick Penna also told him to discuss his current dialysis therapy with Dr. Fausto Skillern.  Previously the patient experienced twitching type symptoms from Neurontin. However, this was before he went on dialysis and had marginal renal function. Dr. Darrick Penna indiciated that he believes it is worth a trial of Neurontin to see if this alleviates his symptoms. If he develops twitching or no relief of his symptoms within 4-6 weeks he was advised discontinue this.  He was given a prescription for 300 mg once daily. He will also take an additional 300 mg post dialysis.  Pt returns today for 12 months follow up with bilateral ABIs and graft duplex of his legs.  He reports bilateral calf claudication with walking about 75 feet, relieved with rest, denies wounds in his feet/legs.  He denies any hx of stroke or TIA.    Pt Diabetic: No, + hx of DM before he started HD Pt smoker: former smoker, quit in 2016  Pt meds include: Statin :Yes Betablocker: Yes ASA: Yes Other anticoagulants/antiplatelets: no   Past Medical History:  Diagnosis Date  . Anemia   . Anginal pain (HCC)   . CHF (congestive heart failure) (HCC)   . CKD  (chronic kidney disease) stage 3, GFR 30-59 ml/min    on dialysis M/W/F - started March 2016  . Coronary atherosclerosis of native coronary artery    100% apical LAD and distal OM1, Rx medically - 11/13  . Depression   . Essential hypertension, benign   . Gouty arthritis   . History of blood transfusion 1988   "arm went thru glass window" (10/12/2012)  . Ischemic cardiomyopathy    LVEF 40-45%  . Mixed hyperlipidemia   . NSTEMI (non-ST elevated myocardial infarction) (HCC) 02/2012 and 10/2012  . PAD (peripheral artery disease) (HCC)    Normal ABIs, 2011; focal left EIA stenosis; mild bilateral distal SFA disease, 2008  . RBBB   . Shortness of breath    when he has fluid he gets sob  . Type 2 diabetes mellitus (HCC)    diet controlled    Social History Social History  Substance Use Topics  . Smoking status: Former Smoker    Packs/day: 0.50    Years: 25.00    Types: Cigarettes    Start date: 07/23/1981    Quit date: 07/11/2013  . Smokeless tobacco: Never Used  . Alcohol use No     Comment: 10/12/2012 "quit drinking > 10 yr ago; was an alcoholic"    Family History Family History  Problem Relation Age of Onset  . Heart attack Father 54  . Hypertension Father   . Hypertension Mother   . Cancer Mother   .  Lupus Mother     Past Surgical History:  Procedure Laterality Date  . Arm surgery Right [phone]   "@ least 3 ORs; took nerves out of my left leg and put them in my arm" (10/12/2012)  . AV FISTULA PLACEMENT Left 10/16/2013   Procedure: CREATION OF LEFT ARM BASILIC TO BRACHIAL FISTULA;  Surgeon: Sherren Kerns, MD;  Location: [facility] OR;  Service: Vascular;  Laterality: Left;  . BASCILIC VEIN TRANSPOSITION Left 08/13/2014   Procedure: Left arm SECOND STAGE BASILIC VEIN TRANSPOSITION;  Surgeon: Sherren Kerns, MD;  Location: [facility] OR;  Service: Vascular;  Laterality: Left;  . CARDIAC CATHETERIZATION  03/28/12   20% prox and mid LAD, 100% distal LAD, 30% prox OM, 100% distal OM, 40%  prox RCA, 40% mid RCA, 25% distal RCA, small PDA w/ 80% diffuse dz, PLB small w/ 50% diffuse stenoses. No focal lesions for PCI. Recommendations for continued medical management and aggressive RF reduction  . CARDIAC CATHETERIZATION N/A 06/11/2016   Procedure: Right/Left Heart Cath and Coronary Angiography;  Surgeon: Lennette Bihari, MD;  Location: [facility] INVASIVE CV LAB;  Service: Cardiovascular;  Laterality: N/A;  . COLONOSCOPY  2015   Brian Beacheam: incomplete colonoscopy due to inadequate prep, inflammation found sigmoid colon to splenic flexure/distal transverse colon. Ischemic colitis vs infectious  . ENDARTERECTOMY FEMORAL Right 07/17/2013   Procedure: ENDARTERECTOMY FEMORAL WITH PROFUNDAPLOASTY;  Surgeon: Sherren Kerns, MD;  Location: [facility] OR;  Service: Vascular;  Laterality: Right;  . ESOPHAGOGASTRODUODENOSCOPY (EGD) WITH PROPOFOL N/A 01/02/2016   Procedure: ESOPHAGOGASTRODUODENOSCOPY (EGD) WITH PROPOFOL;  Surgeon: Corbin Ade, MD;  Location[facility];  Service: Endoscopy;  Laterality: N/A;  . EYE SURGERY Left 1990's   "blood vessel ruptured" (10/12/2012)  . FEMORAL-POPLITEAL BYPASS GRAFT Right 07/17/2013   Procedure: BYPASS GRAFT FEMORAL-POPLITEAL ARTERY-RIGHT;  Surgeon: Sherren Kerns, MD;  Location: [facility] OR;  Service: Vascular;  Laterality: Right;  . LEFT HEART CATHETERIZATION WITH CORONARY ANGIOGRAM N/A 03/28/2012   Procedure: LEFT HEART CATHETERIZATION WITH CORONARY ANGIOGRAM;  Surgeon: Kathleene Hazel, MD;  Location: [facility] CATH LAB;  Service: Cardiovascular;  Laterality: N/A;  . LOWER EXTREMITY ANGIOGRAM Right 07/11/2013   Procedure: LOWER EXTREMITY ANGIOGRAM;  Surgeon: Fransisco Hertz, MD;  Location: [facility] CATH LAB;  Service: Cardiovascular;  Laterality: Right;  rt leg angio  . PATCH ANGIOPLASTY Right 07/17/2013   Procedure: PATCH ANGIOPLASTY OF COMMON FEMORAL AND PROFUNDA USING SEGMENT OF GREATER SAPHENOUS VEIN ;  Surgeon: Sherren Kerns, MD;  Location: [facility] OR;  Service: Vascular;   Laterality: Right;  . PERIPHERAL VASCULAR CATHETERIZATION N/A 02/28/2015   Procedure: Fistulagram;  Surgeon: Sherren Kerns, MD;  Location: [facility] INVASIVE CV LAB;  Service: Cardiovascular;  Laterality: N/A;  . PERIPHERAL VASCULAR CATHETERIZATION Left 02/28/2015   Procedure: A/V Shunt Intervention;  Surgeon: Sherren Kerns, MD;  Location: [facility] INVASIVE CV LAB;  Service: Cardiovascular;  Laterality: Left;  . REFRACTIVE SURGERY Bilateral ~ 2005    Allergies  Allergen Reactions  . Vytorin [Ezetimibe-Simvastatin]     Weakness, muscle aches bad.  . Gabapentin Other (See Comments)    Twitching  . Oxycodone Nausea And Vomiting    Current Outpatient Prescriptions  Medication Sig Dispense Refill  . albuterol (PROVENTIL HFA;VENTOLIN HFA) 108 (90 BASE) MCG/ACT inhaler Inhale 2 puffs into the lungs every 6 (six) hours as needed for wheezing or shortness of breath. 1 Inhaler 2  . aspirin 81 MG chewable tablet Chew 81 mg by mouth daily.    Marland Kitchen  atorvastatin (LIPITOR) 80 MG tablet Take 1 tablet (80 mg total) by mouth daily at 6 PM. 30 tablet 0  . carvedilol (COREG) 6.25 MG tablet Take 1.5 tablets (9.375 mg total) by mouth 2 (two) times daily. 270 tablet 3  . colchicine 0.6 MG tablet Take 0.6 mg by mouth 2 (two) times daily as needed (gout).     Marland Kitchen diphenhydramine-acetaminophen (TYLENOL PM) 25-500 MG TABS Take 1-2 tablets by mouth at bedtime as needed (for pain and sleep).    . docusate sodium (COLACE) 100 MG capsule Take 3 mg by mouth 2 (two) times daily as needed for mild constipation.     . isosorbide mononitrate (IMDUR) 30 MG 24 hr tablet TAKE (1) TABLET TWICE DAILY. 60 tablet 3  . lidocaine-prilocaine (EMLA) cream Apply 1 application topically as needed. For port access  10  . multivitamin (RENA-VIT) TABS tablet Take 1 tablet by mouth daily.    . nitroGLYCERIN (NITROSTAT) 0.4 MG SL tablet PLACE ONE (1) TABLET UNDER TONGUE EVERY 5 MINUTES UP TO (3) DOSES AS NEEDED FOR CHEST PAIN. 25 tablet 3  .  pantoprazole (PROTONIX) 40 MG tablet Take 1 tablet (40 mg total) by mouth 2 (two) times daily. 60 tablet 2  . sevelamer carbonate (RENVELA) 800 MG tablet Take 2,400 mg by mouth 2 (two) times daily.  4  . torsemide (DEMADEX) 20 MG tablet   4   No current facility-administered medications for this visit.     ROS: See HPI for pertinent positives and negatives.   Physical Examination  Vitals:   06/24/16 1456  BP: 134/80  Pulse: 86  Resp: 16  Temp: 98 F (36.7 C)  TempSrc: Oral  SpO2: 96%  Weight: 214 lb (97.1 kg)  Height: 5\' 8"  (1.727 m)   Body mass index is 32.54 kg/m.  General: A&O x 3, WDWN. Gait: normal Eyes: PERRLA. Pulmonary: Respirations are non labored, CTAB, good air movement Cardiac: regular rhythm, no detected murmur.         Carotid Bruits Right Left   Negative Negative  Aorta is not palpable. Radial pulses: are 1+ palpable bilaterally                           VASCULAR EXAM: Extremities without ischemic changes, without Gangrene; without open wounds.                                                                                                          LE Pulses Right Left       FEMORAL   palpable   palpable        POPLITEAL  not palpable   not palpable       POSTERIOR TIBIAL  not palpable   not palpable        DORSALIS PEDIS      ANTERIOR TIBIAL not palpable  faintly palpable    Abdomen: soft, NT, no palpable masses. Skin: no rashes, no ulcers noted. Musculoskeletal: no muscle wasting or atrophy.  Neurologic: A&O X 3;  Appropriate Affect ; SENSATION: normal; MOTOR FUNCTION:  moving all extremities equally, motor strength 5/5 throughout. Speech is fluent/normal. CN 2-12 intact.    ASSESSMENT: ARROW TOMKO is a 46 y.o. male who is s/p right femoral to below-knee popliteal bypass with propaten March 2015.  He has bilateral calf cramping after walking about 75 feet, relieved with rest. There are no signs of ischemia in his feet/legs.   His  atherosclerotic risk factors include ESRD on HD, was diabetic prior to starting HD, former smoker until 2016, and CAD.   Non-Invasive Vascular Imaging: DATE: 06/17/2016 ABI:  RIGHT: 0.71 (0.80, were triphasic, 06-06-15), Waveforms: monophasic; TBI: 0.22 (was [place])  LEFT: 1.05 PT, 0.93 DP( 1.02, were triphasic on 06-06-15) Waveforms: biphasic PT, monophasic DP; TBI: 0.42 (was [place]) Right ABI declined to 0.71 from 0.80 and waveforms declined from triphasic to monophasic. Left ABI appears to remains normal and stable, but waveforms declined from triphasic to bi and monophasic.    PLAN:  Graduated walking program discussed and how to achieve.  Based on the patient's vascular studies and examination, and after discussing with Dr. Darrick Penna, pt will return to clinic in 3 months with ABI's.  I advised him to notify us if he develops concerns re the circulation in his feet/legs.   I discussed in depth with the patient the nature of atherosclerosis, and emphasized the importance of maximal medical management including strict control of blood pressure, blood glucose, and lipid levels, obtaining regular exercise, and continued cessation of smoking.  The patient is aware that without maximal medical management the underlying atherosclerotic disease process will progress, limiting the benefit of any interventions.  The patient was given information about PAD including signs, symptoms, treatment, what symptoms should prompt the patient to seek immediate medical care, and risk reduction measures to take.  Charisse March, RN, MSN, FNP-C [facility] [place] Phone: [phone]  Clinic MD: Darrick Penna  06/24/16 3:27 PM

## 2016-06-25 NOTE — Telephone Encounter (Signed)
Lm for pt to return call to f/u no indication pt went to a [place]

## 2016-06-29 ENCOUNTER — Ambulatory Visit (INDEPENDENT_AMBULATORY_CARE_PROVIDER_SITE_OTHER): Payer: BLUE CROSS/BLUE SHIELD | Admitting: Adult Health

## 2016-06-29 ENCOUNTER — Encounter: Payer: Self-pay | Admitting: Adult Health

## 2016-06-29 VITALS — BP 136/74 | HR 93 | Ht 68.0 in | Wt 214.0 lb

## 2016-06-29 DIAGNOSIS — I5043 Acute on chronic combined systolic (congestive) and diastolic (congestive) heart failure: Secondary | ICD-10-CM

## 2016-06-29 DIAGNOSIS — R079 Chest pain, unspecified: Secondary | ICD-10-CM | POA: Diagnosis not present

## 2016-06-29 DIAGNOSIS — I420 Dilated cardiomyopathy: Secondary | ICD-10-CM | POA: Diagnosis not present

## 2016-06-29 DIAGNOSIS — I10 Essential (primary) hypertension: Secondary | ICD-10-CM

## 2016-06-29 DIAGNOSIS — I779 Disorder of arteries and arterioles, unspecified: Secondary | ICD-10-CM

## 2016-06-29 MED ORDER — METOLAZONE 2.5 MG PO TABS: 2.5000 mg | ORAL_TABLET | Freq: Every day | ORAL | 3 refills | Status: DC

## 2016-06-29 MED ORDER — ISOSORBIDE MONONITRATE ER 60 MG PO TB24: 60.0000 mg | ORAL_TABLET | Freq: Every day | ORAL | 3 refills | Status: AC

## 2016-06-29 MED ORDER — METOLAZONE 2.5 MG PO TABS: 2.5000 mg | ORAL_TABLET | ORAL | 3 refills | Status: AC

## 2016-06-29 NOTE — Progress Notes (Signed)
Name: [name]    DOB: 1970/07/28  Age: 46 y.o.  MR#: [id number]       PCP:  Louie Boston, MD      Insurance: Payor: MEDICARE / Plan: MEDICARE PART A / Product Type: *No Product type* /   CC:   No chief complaint on file.   VS Vitals:   06/29/16 1551  BP: 136/74  Pulse: 93  SpO2: 96%  Weight: 214 lb (97.1 kg)  Height: 5\' 8"  (1.727 m)    Weights Current Weight  06/29/16 214 lb (97.1 kg)  06/24/16 214 lb (97.1 kg)  06/18/16 214 lb (97.1 kg)    Blood Pressure  BP Readings from Last 3 Encounters:  06/29/16 136/74  06/24/16 134/80  06/18/16 140/78     Admit date:  (Not on file) Last encounter with RMR:  06/21/2016   Allergy Vytorin [ezetimibe-simvastatin]; Gabapentin; and Oxycodone  Current Outpatient Prescriptions  Medication Sig Dispense Refill  . albuterol (PROVENTIL HFA;VENTOLIN HFA) 108 (90 BASE) MCG/ACT inhaler Inhale 2 puffs into the lungs every 6 (six) hours as needed for wheezing or shortness of breath. 1 Inhaler 2  . aspirin 81 MG chewable tablet Chew 81 mg by mouth daily.    Marland Kitchen atorvastatin (LIPITOR) 80 MG tablet Take 1 tablet (80 mg total) by mouth daily at 6 PM. 30 tablet 0  . carvedilol (COREG) 6.25 MG tablet Take 1.5 tablets (9.375 mg total) by mouth 2 (two) times daily. 270 tablet 3  . colchicine 0.6 MG tablet Take 0.6 mg by mouth 2 (two) times daily as needed (gout).     Marland Kitchen diphenhydramine-acetaminophen (TYLENOL PM) 25-500 MG TABS Take 1-2 tablets by mouth at bedtime as needed (for pain and sleep).    . docusate sodium (COLACE) 100 MG capsule Take 3 mg by mouth 2 (two) times daily as needed for mild constipation.     . isosorbide mononitrate (IMDUR) 30 MG 24 hr tablet TAKE (1) TABLET TWICE DAILY. 60 tablet 3  . lidocaine-prilocaine (EMLA) cream Apply 1 application topically as needed. For port access  10  . multivitamin (RENA-VIT) TABS tablet Take 1 tablet by mouth daily.    . nitroGLYCERIN (NITROSTAT) 0.4 MG SL tablet PLACE ONE (1) TABLET UNDER TONGUE  EVERY 5 MINUTES UP TO (3) DOSES AS NEEDED FOR CHEST PAIN. 25 tablet 3  . pantoprazole (PROTONIX) 40 MG tablet Take 1 tablet (40 mg total) by mouth 2 (two) times daily. 60 tablet 2  . sevelamer carbonate (RENVELA) 800 MG tablet Take 2,400 mg by mouth 2 (two) times daily.  4  . torsemide (DEMADEX) 20 MG tablet   4   No current facility-administered medications for this visit.     Discontinued Meds:   There are no discontinued medications.  Patient Active Problem List   Diagnosis Date Noted  . Acute on chronic combined systolic and diastolic CHF (congestive heart failure) (HCC)   . Absolute anemia   . Upper GI bleed 01/01/2016  . SOB (shortness of breath) 01/01/2016  . End stage renal disease (HCC) 11/01/2013  . Pain in limb-Right groin 09/06/2013  . Discoloration of skin of toe-Right great toe 09/06/2013  . Wound drainage-Right groin-Right Thigh 09/06/2013  . Blood in stool- Today 09/06/2013  . Acute on chronic renal failure (HCC) 07/09/2013  . Noninfectious gastroenteritis and colitis 07/09/2013  . PVD (peripheral vascular disease) (HCC) 06/28/2013  . Ischemic toe 05/31/2013  . Ischemic cardiomyopathy   . Coronary atherosclerosis of native coronary artery 07/12/2012  .  Non-ST elevation (NSTEMI) myocardial infarction (HCC) 03/28/2012  . Hyperlipidemia 03/28/2012  . Type 2 diabetes mellitus with diabetic chronic kidney disease (HCC) 03/28/2012  . PAD (peripheral artery disease) (HCC) 03/28/2012  . Tobacco abuse 03/28/2012  . Essential hypertension, benign 03/28/2012    LABS    Component Value Date/Time   NA 136 06/13/2016 0415   NA 136 06/12/2016 1045   NA 137 06/11/2016 1300   K 4.2 06/13/2016 0415   K 4.6 06/12/2016 1045   K 5.7 (H) 06/11/2016 1300   CL 95 (L) 06/13/2016 0415   CL 99 (L) 06/12/2016 1045   CL 100 (L) 06/11/2016 1300   CO2 27 06/13/2016 0415   CO2 25 06/12/2016 1045   CO2 25 06/11/2016 1300   GLUCOSE 94 06/13/2016 0415   GLUCOSE 91 06/12/2016 1045    GLUCOSE 88 06/11/2016 1300   BUN 43 (H) 06/13/2016 0415   BUN 56 (H) 06/12/2016 1045   BUN 80 (H) 06/11/2016 1300   CREATININE 10.75 (H) 06/13/2016 0415   CREATININE 12.47 (H) 06/12/2016 1045   CREATININE 15.72 (H) 06/11/2016 1900   CALCIUM 8.9 06/13/2016 0415   CALCIUM 8.6 (L) 06/12/2016 1045   CALCIUM 8.4 (L) 06/11/2016 1300   GFRNONAA 5 (L) 06/13/2016 0415   GFRNONAA 4 (L) 06/12/2016 1045   GFRNONAA 3 (L) 06/11/2016 1900   GFRAA 6 (L) 06/13/2016 0415   GFRAA 5 (L) 06/12/2016 1045   GFRAA 4 (L) 06/11/2016 1900   CMP     Component Value Date/Time   NA 136 06/13/2016 0415   K 4.2 06/13/2016 0415   CL 95 (L) 06/13/2016 0415   CO2 27 06/13/2016 0415   GLUCOSE 94 06/13/2016 0415   BUN 43 (H) 06/13/2016 0415   CREATININE 10.75 (H) 06/13/2016 0415   CALCIUM 8.9 06/13/2016 0415   PROT 6.4 (L) 06/12/2016 1045   ALBUMIN 3.3 (L) 06/13/2016 0415   AST 10 (L) 06/12/2016 1045   ALT 6 (L) 06/12/2016 1045   ALKPHOS 41 06/12/2016 1045   BILITOT 0.8 06/12/2016 1045   GFRNONAA 5 (L) 06/13/2016 0415   GFRAA 6 (L) 06/13/2016 0415       Component Value Date/Time   WBC 4.5 06/13/2016 0415   WBC 5.0 06/12/2016 2004   WBC 5.1 06/11/2016 1900   HGB 10.2 (L) 06/13/2016 0415   HGB 10.7 (L) 06/12/2016 2004   HGB 10.3 (L) 06/11/2016 1900   HCT 33.7 (L) 06/13/2016 0415   HCT 34.5 (L) 06/12/2016 2004   HCT 33.5 (L) 06/11/2016 1900   MCV 91.1 06/13/2016 0415   MCV 90.1 06/12/2016 2004   MCV 91.3 06/11/2016 1900    Lipid Panel     Component Value Date/Time   CHOL 84 06/10/2016 0448   TRIG 78 06/10/2016 0448   HDL 28 (L) 06/10/2016 0448   CHOLHDL 3.0 06/10/2016 0448   VLDL 16 06/10/2016 0448   LDLCALC 40 06/10/2016 0448    ABG    Component Value Date/Time   PHART 7.339 (L) 06/11/2016 1702   PCO2ART 45.3 06/11/2016 1702   PO2ART 87.0 06/11/2016 1702   HCO3 24.7 06/11/2016 1711   TCO2 26 06/11/2016 1711   ACIDBASEDEF 2.0 06/11/2016 1711   O2SAT 70.0 06/11/2016 1711     Lab  Results  Component Value Date   TSH 3.430 10/01/2010   BNP (last 3 results)  Recent Labs  06/09/16 0740  BNP 1,577.0*    ProBNP (last 3 results) No results for input(s): PROBNP in  the last 8760 hours.  Cardiac Panel (last 3 results) No results for input(s): CKTOTAL, CKMB, TROPONINI, RELINDX in the last 72 hours.  Iron/TIBC/Ferritin/ %Sat    Component Value Date/Time   IRON 25 (L) 01/02/2016 1714   TIBC 304 01/02/2016 1714   FERRITIN 512 (H) 01/02/2016 1714   IRONPCTSAT 8 (L) 01/02/2016 1714     EKG Orders placed or performed during the hospital encounter of 06/09/16  . EKG 12-Lead  . EKG 12-Lead  . EKG  . EKG 12-Lead  . EKG 12-Lead     Prior Assessment and Plan Problem List as of 06/29/2016 Reviewed: 06/24/2016  7:36 PM by Annye Rusk, NP     Cardiovascular and Mediastinum   Non-ST elevation (NSTEMI) myocardial infarction [facility])   Last Assessment & Plan 04/05/2012 Office Visit Written 04/05/2012  2:17 PM by Rande Brunt, PA    Patient advised to gradually increase his exercise tolerance. He is cleared to return to work next week, and per his request. We will reassess his clinical status in 3 months, with Dr. Diona Browner.      PAD (peripheral artery disease) (HCC)   Essential hypertension, benign   Last Assessment & Plan 10/23/2012 Office Visit Written 10/23/2012 11:18 AM by Rande Brunt, PA-C     To be reassessed following increase in carvedilol dose      Coronary atherosclerosis of native coronary artery   Last Assessment & Plan 10/23/2012 Office Visit Edited 10/23/2012 11:22 AM by Rande Brunt, PA-C    Continued aggressive medical management and risk factor modification recommended, including complete smoking cessation. Patient now reports compliance with his medications, including resumption of Plavix. Will decrease Imdur to 15 mg daily, following development of HA. Of note, patient is to remain on DAPT through 03/2013. He has been cleared to resume working,  without restriction.      Ischemic cardiomyopathy   Last Assessment & Plan 10/23/2012 Office Visit Written 10/23/2012 11:15 AM by Rande Brunt, PA-C    Will increase carvedilol to 18.75 twice a day and arrange an RN visit in 2 weeks to reassess BP/HR. If stable, continue further up titration to target dose 25 twice a day.      Ischemic toe   PVD (peripheral vascular disease) (HCC)   Acute on chronic combined systolic and diastolic CHF (congestive heart failure) (HCC)     Digestive   Noninfectious gastroenteritis and colitis   Blood in stool- Today   Upper GI bleed     Endocrine   Type 2 diabetes mellitus with diabetic chronic kidney disease (HCC)     Musculoskeletal and Integument   Discoloration of skin of toe-Right great toe     Genitourinary   Acute on chronic renal failure (HCC)   End stage renal disease (HCC)     Other   Hyperlipidemia   Last Assessment & Plan 10/23/2012 Office Visit Written 10/23/2012 11:17 AM by Rande Brunt, PA-C    Patient reportedly was taken off Lipitor recently, by Dr. Kristian Covey. I instructed him to further clarify this with him at time of scheduled followup within the next several weeks. Meanwhile, we'll increase omega-3 fish oil to 2 g twice a day and reassess lipid status in 12 weeks. Of note, I would try to maintain him on a low-dose statin, while targeting his hypertriglyceridemia, if possible.      Tobacco abuse   Last Assessment & Plan 10/23/2012 Office Visit Written 10/23/2012 11:15 AM by Rande Brunt,  PA-C    Patient strongly advised to stop smoking completely      Pain in limb-Right groin   Wound drainage-Right groin-Right Thigh   SOB (shortness of breath)   Absolute anemia       Imaging: Dg Chest 2 View  Result Date: 06/09/2016 CLINICAL DATA:  Left-sided chest pain for 1 day EXAM: CHEST  2 VIEW COMPARISON:  01/01/2016 FINDINGS: Cardiac shadow remains enlarged. The lungs are well aerated bilaterally. No focal infiltrate or sizable  effusion is seen. No bony abnormality is noted. Previously seen bilateral infiltrates have cleared in the interval. IMPRESSION: No active cardiopulmonary disease. Electronically Signed   By: Alcide Clever M.D.   On: 06/09/2016 08:20

## 2016-06-29 NOTE — Progress Notes (Signed)
theCardiology Office Note   Date:  06/29/2016   ID:  [name], DOB November 11, 1970, MRN [id number]  PCP:  Louie Boston, MD  Cardiologist: Inis Sizer, NP   Chief Complaint  Patient presents with  . Chest Pain  . Cardiomyopathy      History of Present Illness: [name] is a 46 y.o. male who presents for ongoing assessment and management of CAD, with known history of catheterization and February 2018 revealing chronically occluded right coronary artery with collaterals and a long 55% proximal to mid LAD stenosis. This did not require intervention. The patient was continued on medical therapy with aspirin and atorvastatin, with carvedilol 6.25 mg twice a day. He was last seen in the office on 06/18/2016, was feeling much better, and continuing dialysis as directed.   He was seen last week, and doing well but had recurrent chest pain over the weekend and requested a follow-up appointment sooner than previously scheduled He states that while he was in church he had recurrent chest pain. Had to take 3 nitroglycerin for relief. He occasionally feels chest pain when he is exerting himself. He is currently on isosorbide 30 mg daily. He does not take them the day of dialysis until after completion. Otherwise he's doing well and has been compliant with medications. This is a focus visit on his recurrent chest discomfort.  Past Medical History:  Diagnosis Date  . Anemia   . Anginal pain (HCC)   . CHF (congestive heart failure) (HCC)   . CKD (chronic kidney disease) stage 3, GFR 30-59 ml/min    on dialysis M/W/F - started March 2016  . Coronary atherosclerosis of native coronary artery    100% apical LAD and distal OM1, Rx medically - 11/13  . Depression   . Essential hypertension, benign   . Gouty arthritis   . History of blood transfusion 1988   "arm went thru glass window" (10/12/2012)  . Ischemic cardiomyopathy    LVEF 40-45%  . Mixed hyperlipidemia   . NSTEMI (non-ST  elevated myocardial infarction) (HCC) 02/2012 and 10/2012  . PAD (peripheral artery disease) (HCC)    Normal ABIs, 2011; focal left EIA stenosis; mild bilateral distal SFA disease, 2008  . RBBB   . Shortness of breath    when he has fluid he gets sob  . Type 2 diabetes mellitus (HCC)    diet controlled    Past Surgical History:  Procedure Laterality Date  . Arm surgery Right [phone]   "@ least 3 ORs; took nerves out of my left leg and put them in my arm" (10/12/2012)  . AV FISTULA PLACEMENT Left 10/16/2013   Procedure: CREATION OF LEFT ARM BASILIC TO BRACHIAL FISTULA;  Surgeon: Sherren Kerns, MD;  Location: [facility] OR;  Service: Vascular;  Laterality: Left;  . BASCILIC VEIN TRANSPOSITION Left 08/13/2014   Procedure: Left arm SECOND STAGE BASILIC VEIN TRANSPOSITION;  Surgeon: Sherren Kerns, MD;  Location: [facility] OR;  Service: Vascular;  Laterality: Left;  . CARDIAC CATHETERIZATION  03/28/12   20% prox and mid LAD, 100% distal LAD, 30% prox OM, 100% distal OM, 40% prox RCA, 40% mid RCA, 25% distal RCA, small PDA w/ 80% diffuse dz, PLB small w/ 50% diffuse stenoses. No focal lesions for PCI. Recommendations for continued medical management and aggressive RF reduction  . CARDIAC CATHETERIZATION N/A 06/11/2016   Procedure: Right/Left Heart Cath and Coronary Angiography;  Surgeon: Lennette Bihari, MD;  Location: [facility] INVASIVE CV LAB;  Service: Cardiovascular;  Laterality: N/A;  . COLONOSCOPY  2015   Brian Beacheam: incomplete colonoscopy due to inadequate prep, inflammation found sigmoid colon to splenic flexure/distal transverse colon. Ischemic colitis vs infectious  . ENDARTERECTOMY FEMORAL Right 07/17/2013   Procedure: ENDARTERECTOMY FEMORAL WITH PROFUNDAPLOASTY;  Surgeon: Sherren Kerns, MD;  Location: [facility] OR;  Service: Vascular;  Laterality: Right;  . ESOPHAGOGASTRODUODENOSCOPY (EGD) WITH PROPOFOL N/A 01/02/2016   Procedure: ESOPHAGOGASTRODUODENOSCOPY (EGD) WITH PROPOFOL;  Surgeon: Corbin Ade, MD;   Location: [facility]ITE;  Service: Endoscopy;  Laterality: N/A;  . EYE SURGERY Left 1990's   "blood vessel ruptured" (10/12/2012)  . FEMORAL-POPLITEAL BYPASS GRAFT Right 07/17/2013   Procedure: BYPASS GRAFT FEMORAL-POPLITEAL ARTERY-RIGHT;  Surgeon: Sherren Kerns, MD;  Location: [facility] OR;  Service: Vascular;  Laterality: Right;  . LEFT HEART CATHETERIZATION WITH CORONARY ANGIOGRAM N/A 03/28/2012   Procedure: LEFT HEART CATHETERIZATION WITH CORONARY ANGIOGRAM;  Surgeon: Kathleene Hazel, MD;  Location: [facility] CATH LAB;  Service: Cardiovascular;  Laterality: N/A;  . LOWER EXTREMITY ANGIOGRAM Right 07/11/2013   Procedure: LOWER EXTREMITY ANGIOGRAM;  Surgeon: Fransisco Hertz, MD;  Location: [facility] CATH LAB;  Service: Cardiovascular;  Laterality: Right;  rt leg angio  . PATCH ANGIOPLASTY Right 07/17/2013   Procedure: PATCH ANGIOPLASTY OF COMMON FEMORAL AND PROFUNDA USING SEGMENT OF GREATER SAPHENOUS VEIN ;  Surgeon: Sherren Kerns, MD;  Location: [facility] OR;  Service: Vascular;  Laterality: Right;  . PERIPHERAL VASCULAR CATHETERIZATION N/A 02/28/2015   Procedure: Fistulagram;  Surgeon: Sherren Kerns, MD;  Location: [facility] INVASIVE CV LAB;  Service: Cardiovascular;  Laterality: N/A;  . PERIPHERAL VASCULAR CATHETERIZATION Left 02/28/2015   Procedure: A/V Shunt Intervention;  Surgeon: Sherren Kerns, MD;  Location: [facility] INVASIVE CV LAB;  Service: Cardiovascular;  Laterality: Left;  . REFRACTIVE SURGERY Bilateral ~ 2005     Current Outpatient Prescriptions  Medication Sig Dispense Refill  . albuterol (PROVENTIL HFA;VENTOLIN HFA) 108 (90 BASE) MCG/ACT inhaler Inhale 2 puffs into the lungs every 6 (six) hours as needed for wheezing or shortness of breath. 1 Inhaler 2  . aspirin 81 MG chewable tablet Chew 81 mg by mouth daily.    Marland Kitchen atorvastatin (LIPITOR) 80 MG tablet Take 1 tablet (80 mg total) by mouth daily at 6 PM. 30 tablet 0  . carvedilol (COREG) 6.25 MG tablet Take 1.5 tablets (9.375 mg total) by mouth 2 (two)  times daily. 270 tablet 3  . colchicine 0.6 MG tablet Take 0.6 mg by mouth 2 (two) times daily as needed (gout).     Marland Kitchen diphenhydramine-acetaminophen (TYLENOL PM) 25-500 MG TABS Take 1-2 tablets by mouth at bedtime as needed (for pain and sleep).    . docusate sodium (COLACE) 100 MG capsule Take 3 mg by mouth 2 (two) times daily as needed for mild constipation.     . lidocaine-prilocaine (EMLA) cream Apply 1 application topically as needed. For port access  10  . multivitamin (RENA-VIT) TABS tablet Take 1 tablet by mouth daily.    . nitroGLYCERIN (NITROSTAT) 0.4 MG SL tablet PLACE ONE (1) TABLET UNDER TONGUE EVERY 5 MINUTES UP TO (3) DOSES AS NEEDED FOR CHEST PAIN. 25 tablet 3  . pantoprazole (PROTONIX) 40 MG tablet Take 1 tablet (40 mg total) by mouth 2 (two) times daily. 60 tablet 2  . sevelamer carbonate (RENVELA) 800 MG tablet Take 2,400 mg by mouth 2 (two) times daily.  4  . torsemide (DEMADEX) 20 MG tablet   4  . isosorbide mononitrate (  IMDUR) 60 MG 24 hr tablet Take 1 tablet (60 mg total) by mouth daily. 90 tablet 3  . metolazone (ZAROXOLYN) 2.5 MG tablet Take 1 tablet (2.5 mg total) by mouth every other day. 45 tablet 3   No current facility-administered medications for this visit.     Allergies:   Vytorin [ezetimibe-simvastatin]; Gabapentin; and Oxycodone    Social History:  The patient  reports that he quit smoking about 2 years ago. His smoking use included Cigarettes. He started smoking about 34 years ago. He has a 12.50 pack-year smoking history. He has never used smokeless tobacco. He reports that he uses drugs, including Cocaine, "Crack" cocaine, and Marijuana. He reports that he does not drink alcohol.   Family History:  The patient's family history includes Cancer in his mother; Heart attack (age of onset: 72) in his father; Hypertension in his father and mother; Lupus in his mother.    ROS: All other systems are reviewed and negative. Unless otherwise mentioned in H&P     PHYSICAL EXAM: VS:  BP 136/74   Pulse 93   Ht 5\' 8"  (1.727 m)   Wt 214 lb (97.1 kg)   SpO2 96%   BMI 32.54 kg/m  , BMI Body mass index is 32.54 kg/m. GEN: Well nourished, well developed, in no acute distress  HEENT: normal  Neck: no JVD, carotid bruits, or masses Cardiac: RRR; vascular thrill auscultated on the left. no murmurs, rubs, or gallops,no edema  Respiratory:  clear to auscultation bilaterally, normal work of breathing GI: soft, nontender, nondistended, + BS MS: no deformity or atrophy Left upper arm AV fistula with good palpable thrill no evidence of infection or bleeding Skin: warm and dry, no rash Neuro:  Strength and sensation are intact Psych: euthymic mood, full affect  Recent Labs: 06/09/2016: B Natriuretic Peptide 1,577.0 06/12/2016: ALT 6 06/13/2016: BUN 43; Creatinine, Ser 10.75; Hemoglobin 10.2; Platelets 152; Potassium 4.2; Sodium 136    Lipid Panel    Component Value Date/Time   CHOL 84 06/10/2016 0448   TRIG 78 06/10/2016 0448   HDL 28 (L) 06/10/2016 0448   CHOLHDL 3.0 06/10/2016 0448   VLDL 16 06/10/2016 0448   LDLCALC 40 06/10/2016 0448      Wt Readings from Last 3 Encounters:  06/29/16 214 lb (97.1 kg)  06/24/16 214 lb (97.1 kg)  06/18/16 214 lb (97.1 kg)      Other studies Reviewed: Other studies Reviewed: Cardiac Cath 06/29/2016 Conclusion     Prox RCA lesion, 100 %stenosed.  Prox LAD to Mid LAD lesion, 55 %stenosed.  Hemodynamic findings consistent with severe pulmonary hypertension.   Severely elevated right heart pressures with severe pulmonary hypertension.  Two  vessel CAD with a calcified proximal LAD with 55% proximal stenosis; a normal left circumflex coronary artery; and total occlusion of the proximal RCA with extensive left-to-right collateralization.  RECOMMENDATION: The patient will undergo dialysis immediately following his cardiac catheterization.  Plan initial medical therapy for CAD.  If LAD lesion  progresses, the patient may require atherectomy due to the calcified stenosis.      Echocardiogram 1\[id number] Left ventricle: The cavity size was moderately to severely dilated. Wall thickness was increased in a pattern of mild LVH. Systolic function was severely reduced. The estimated ejection fraction was 25%. Diffuse hypokinesis. Features are consistent with a pseudonormal left ventricular filling pattern, with concomitant abnormal relaxation and increased filling pressure (grade 2 diastolic dysfunction). Doppler parameters are consistent with high ventricular filling pressure. - Aortic  valve: Mildly to moderately calcified annulus. Trileaflet. - Mitral valve: Mildly thickened leaflets . There was mild regurgitation. - Left atrium: The atrium was mildly dilated. - Right ventricle: Systolic function was moderately reduced. - Right atrium: The atrium was mildly dilated. - Tricuspid valve: There was moderate-severe regurgitation. - Pulmonary arteries: PA peak pressure: 48 mm Hg (S). - Systemic veins: IVC was dilated with normal respiratory variation. Estimated CVP 8 mmHg. - Pericardium, extracardiac: A small pericardial effusion was identified.  ASSESSMENT AND PLAN:  1. Chest pain: He is on isosorbide mono 30 mg daily, but is having breakthrough discomfort, along with need to take nitroglycerin sublingual. I will increase his isosorbide mononitrate to 60 mg daily. I've advised him that if he begins to feel dizzy or lightheaded with increased dose he needs to call us and not stop taking the medication altogether. Hopefully this will help him with his recurrent pain. I've asked him not to overexert himself, until he has had ongoing management and stabilization of his cardiomyopathy.  2. Acute on chronic combined systolic and diastolic heart failure: He is oliguric, and continues to have dialysis for fluid management. He has run out of metolazone which he has been  asked to take every other day for increased fluid retention. I've given him a prescription for this. He will continue on torsemide as directed. Follow-up labs are completed by nephrology.  3. End-stage renal disease: The patient is going to dialysis 6 days a week, is only able to undergo it for 2 hours without having excessive cramping. His dry weight has decreased to 97 kg, with a goal of 96 kg. Management per nephrology.   Current medicines are reviewed at length with the patient today.    Labs/ tests ordered today include: No orders of the defined types were placed in this encounter.    Disposition:   FU with  Dr. Wyline Mood in 3 months Signed, Joni Reining, NP  06/29/2016 5:16 PM    [facility] [address] [place], [place] [address] Phone: [phone]; Fax: [phone]

## 2016-06-29 NOTE — Patient Instructions (Signed)
Your physician recommends that you schedule a follow-up appointment with Joni Reining, NP   Your physician has recommended you make the following change in your medication:  Increase Imdur to 60 mg Daily  Metolazone 2.5mg    If you need a refill on your cardiac medications before your next appointment, please call your pharmacy.  Thank you for choosing [facility]!

## 2016-07-01 NOTE — Addendum Note (Signed)
Addended by: Burton Apley A on: 07/01/2016 04:05 PM   Modules accepted: Orders

## 2016-07-20 ENCOUNTER — Ambulatory Visit: Payer: BLUE CROSS/BLUE SHIELD | Admitting: Cardiology

## 2016-07-20 ENCOUNTER — Encounter: Payer: Self-pay | Admitting: Cardiology

## 2016-07-20 NOTE — Progress Notes (Deleted)
Clinical Summary [name] is a 46 y.o.male 1. CAD/ICM  -cath 03/2012 severe distal disease too small for intervention, multiple mild to moderate lesions. Managed medically echo 11/2013 LVEF 50-55%, grade II diastolic dysfunction, inferior hypokinesis.  - 02/2014 Lexiscan MPI inferior scar without ischemia, LVEF 33%.  - Over the last few visits he has been having some chest pain symptoms.  - aching/sharp pain with some SOB, left chest. 7/10. Could occur at rest or exertion, but more with exertion. Could be diaphoretic at time. Better with movement. Could be brought on by eating or drinking. Baby ASA made, rubbing could make better. Pain could last for hours consistently.  - symptoms did not improve with increasing his coreg. Tolerating imdur 30mg  bid, previously had caused headaches.  -04/2015  cath at [facility] which showed RCA CTO, chronic distal LAD and LCX disease overall unchanged. Managed medically   - admit 06/2016 with NSTEMI. 06/2016 cath 55% prox LAD, patent LCX, occluded RCA with left to right collaterals. Severe pulm HTN without significant transpulmonary gradient consistent with left sided heart disease.   - last visit imdur increased to 60mg  daily.   2. HL  - on lovastatin due to costs, compliant - 04/2015 TC 128 TG 170 HDL 28 LDL 78  3. HTN  - compliant with meds - does not check regularly at home  4. PAD  - followed by vascular Dr Darrick Penna  - right femoropopiliteal bypass in March 2015.   5. SOB - seen by Dr Purvis Sheffield in late January due to SOB. Patient had not been able to tolerate hemodialysis due to muscle cramps (normal K and Mg by labs)and was accumulating fluid. He was started on metolazone 5mg  daily x 4 days. Swelling and SOB did improve.   6. ESRD - can tolerate only 2 hours of dialysis. HD T,TH,Sat - breathing improved with metolazone.  - reports his dry 101 kg.  - reports not eligible for transplant due to his CAD.    Past Medical  History:  Diagnosis Date  . Anemia   . Anginal pain (HCC)   . CHF (congestive heart failure) (HCC)   . CKD (chronic kidney disease) stage 3, GFR 30-59 ml/min    on dialysis M/W/F - started March 2016  . Coronary atherosclerosis of native coronary artery    100% apical LAD and distal OM1, Rx medically - 11/13  . Depression   . Essential hypertension, benign   . Gouty arthritis   . History of blood transfusion 1988   "arm went thru glass window" (10/12/2012)  . Ischemic cardiomyopathy    LVEF 40-45%  . Mixed hyperlipidemia   . NSTEMI (non-ST elevated myocardial infarction) (HCC) 02/2012 and 10/2012  . PAD (peripheral artery disease) (HCC)    Normal ABIs, 2011; focal left EIA stenosis; mild bilateral distal SFA disease, 2008  . RBBB   . Shortness of breath    when he has fluid he gets sob  . Type 2 diabetes mellitus (HCC)    diet controlled     Allergies  Allergen Reactions  . Vytorin [Ezetimibe-Simvastatin]     Weakness, muscle aches bad.  . Gabapentin Other (See Comments)    Twitching  . Oxycodone Nausea And Vomiting     Current Outpatient Prescriptions  Medication Sig Dispense Refill  . albuterol (PROVENTIL HFA;VENTOLIN HFA) 108 (90 BASE) MCG/ACT inhaler Inhale 2 puffs into the lungs every 6 (six) hours as needed for wheezing or shortness of breath. 1 Inhaler 2  .  aspirin 81 MG chewable tablet Chew 81 mg by mouth daily.    Marland Kitchen atorvastatin (LIPITOR) 80 MG tablet Take 1 tablet (80 mg total) by mouth daily at 6 PM. 30 tablet 0  . carvedilol (COREG) 6.25 MG tablet Take 1.5 tablets (9.375 mg total) by mouth 2 (two) times daily. 270 tablet 3  . colchicine 0.6 MG tablet Take 0.6 mg by mouth 2 (two) times daily as needed (gout).     Marland Kitchen diphenhydramine-acetaminophen (TYLENOL PM) 25-500 MG TABS Take 1-2 tablets by mouth at bedtime as needed (for pain and sleep).    . docusate sodium (COLACE) 100 MG capsule Take 3 mg by mouth 2 (two) times daily as needed for mild constipation.     .  isosorbide mononitrate (IMDUR) 60 MG 24 hr tablet Take 1 tablet (60 mg total) by mouth daily. 90 tablet 3  . lidocaine-prilocaine (EMLA) cream Apply 1 application topically as needed. For port access  10  . metolazone (ZAROXOLYN) 2.5 MG tablet Take 1 tablet (2.5 mg total) by mouth every other day. 45 tablet 3  . multivitamin (RENA-VIT) TABS tablet Take 1 tablet by mouth daily.    . nitroGLYCERIN (NITROSTAT) 0.4 MG SL tablet PLACE ONE (1) TABLET UNDER TONGUE EVERY 5 MINUTES UP TO (3) DOSES AS NEEDED FOR CHEST PAIN. 25 tablet 3  . pantoprazole (PROTONIX) 40 MG tablet Take 1 tablet (40 mg total) by mouth 2 (two) times daily. 60 tablet 2  . sevelamer carbonate (RENVELA) 800 MG tablet Take 2,400 mg by mouth 2 (two) times daily.  4  . torsemide (DEMADEX) 20 MG tablet   4   No current facility-administered medications for this visit.      Past Surgical History:  Procedure Laterality Date  . Arm surgery Right [phone]   "@ least 3 ORs; took nerves out of my left leg and put them in my arm" (10/12/2012)  . AV FISTULA PLACEMENT Left 10/16/2013   Procedure: CREATION OF LEFT ARM BASILIC TO BRACHIAL FISTULA;  Surgeon: Sherren Kerns, MD;  Location: [facility] OR;  Service: Vascular;  Laterality: Left;  . BASCILIC VEIN TRANSPOSITION Left 08/13/2014   Procedure: Left arm SECOND STAGE BASILIC VEIN TRANSPOSITION;  Surgeon: Sherren Kerns, MD;  Location: [facility] OR;  Service: Vascular;  Laterality: Left;  . CARDIAC CATHETERIZATION  03/28/12   20% prox and mid LAD, 100% distal LAD, 30% prox OM, 100% distal OM, 40% prox RCA, 40% mid RCA, 25% distal RCA, small PDA w/ 80% diffuse dz, PLB small w/ 50% diffuse stenoses. No focal lesions for PCI. Recommendations for continued medical management and aggressive RF reduction  . CARDIAC CATHETERIZATION N/A 06/11/2016   Procedure: Right/Left Heart Cath and Coronary Angiography;  Surgeon: Lennette Bihari, MD;  Location: [facility] INVASIVE CV LAB;  Service: Cardiovascular;  Laterality: N/A;  .  COLONOSCOPY  2015   Brian Beacheam: incomplete colonoscopy due to inadequate prep, inflammation found sigmoid colon to splenic flexure/distal transverse colon. Ischemic colitis vs infectious  . ENDARTERECTOMY FEMORAL Right 07/17/2013   Procedure: ENDARTERECTOMY FEMORAL WITH PROFUNDAPLOASTY;  Surgeon: Sherren Kerns, MD;  Location: [facility] OR;  Service: Vascular;  Laterality: Right;  . ESOPHAGOGASTRODUODENOSCOPY (EGD) WITH PROPOFOL N/A 01/02/2016   Procedure: ESOPHAGOGASTRODUODENOSCOPY (EGD) WITH PROPOFOL;  Surgeon: Corbin Ade, MD;  Location: [facility] END[facility] SUITE;  Service: Endoscopy;  Laterality: N/A;  . EYE SURGERY Left 1990's   "blood vessel ruptured" (10/12/2012)  . FEMORAL-POPLITEAL BYPASS GRAFT Right 07/17/2013   Procedure: BYPASS GRAFT FEMORAL-POPLITEAL ARTERY-RIGHT;  Surgeon: Sherren Kerns, MD;  Location: [facility] OR;  Service: Vascular;  Laterality: Right;  . LEFT HEART CATHETERIZATION WITH CORONARY ANGIOGRAM N/A 03/28/2012   Procedure: LEFT HEART CATHETERIZATION WITH CORONARY ANGIOGRAM;  Surgeon: Kathleene Hazel, MD;  Location: [facility] CATH LAB;  Service: Cardiovascular;  Laterality: N/A;  . LOWER EXTREMITY ANGIOGRAM Right 07/11/2013   Procedure: LOWER EXTREMITY ANGIOGRAM;  Surgeon: Fransisco Hertz, MD;  Location: [facility] CATH LAB;  Service: Cardiovascular;  Laterality: Right;  rt leg angio  . PATCH ANGIOPLASTY Right 07/17/2013   Procedure: PATCH ANGIOPLASTY OF COMMON FEMORAL AND PROFUNDA USING SEGMENT OF GREATER SAPHENOUS VEIN ;  Surgeon: Sherren Kerns, MD;  Location: [facility] OR;  Service: Vascular;  Laterality: Right;  . PERIPHERAL VASCULAR CATHETERIZATION N/A 02/28/2015   Procedure: Fistulagram;  Surgeon: Sherren Kerns, MD;  Location: [facility] INVASIVE CV LAB;  Service: Cardiovascular;  Laterality: N/A;  . PERIPHERAL VASCULAR CATHETERIZATION Left 02/28/2015   Procedure: A/V Shunt Intervention;  Surgeon: Sherren Kerns, MD;  Location: [facility] INVASIVE CV LAB;  Service: Cardiovascular;  Laterality: Left;  .  REFRACTIVE SURGERY Bilateral ~ 2005     Allergies  Allergen Reactions  . Vytorin [Ezetimibe-Simvastatin]     Weakness, muscle aches bad.  . Gabapentin Other (See Comments)    Twitching  . Oxycodone Nausea And Vomiting      Family History  Problem Relation Age of Onset  . Heart attack Father 35  . Hypertension Father   . Hypertension Mother   . Cancer Mother   . Lupus Mother      Social History Mr. Rabinowitz reports that he quit smoking about 3 years ago. His smoking use included Cigarettes. He started smoking about 35 years ago. He has a 12.50 pack-year smoking history. He has never used smokeless tobacco. Mr. Maresca reports that he does not drink alcohol.   Review of Systems CONSTITUTIONAL: No weight loss, fever, chills, weakness or fatigue.  HEENT: Eyes: No visual loss, blurred vision, double vision or yellow sclerae.No hearing loss, sneezing, congestion, runny nose or sore throat.  SKIN: No rash or itching.  CARDIOVASCULAR:  RESPIRATORY: No shortness of breath, cough or sputum.  GASTROINTESTINAL: No anorexia, nausea, vomiting or diarrhea. No abdominal pain or blood.  GENITOURINARY: No burning on urination, no polyuria NEUROLOGICAL: No headache, dizziness, syncope, paralysis, ataxia, numbness or tingling in the extremities. No change in bowel or bladder control.  MUSCULOSKELETAL: No muscle, back pain, joint pain or stiffness.  LYMPHATICS: No enlarged nodes. No history of splenectomy.  PSYCHIATRIC: No history of depression or anxiety.  ENDOCRINOLOGIC: No reports of sweating, cold or heat intolerance. No polyuria or polydipsia.  Marland Kitchen   Physical Examination There were no vitals filed for this visit. There were no vitals filed for this visit.  Gen: resting comfortably, no acute distress HEENT: no scleral icterus, pupils equal round and reactive, no palptable cervical adenopathy,  CV Resp: Clear to auscultation bilaterally GI: abdomen is soft, non-tender, non-distended,  normal bowel sounds, no hepatosplenomegaly MSK: extremities are warm, no edema.  Skin: warm, no rash Neuro:  no focal deficits Psych: appropriate affect   Diagnostic Studies Cath 03/2012  Hemodynamic Findings:  Central aortic pressure: 150/100  Left ventricular pressure: 148/17/21  Angiographic Findings:  Left main: No obstructive disease.  Left Anterior Descending Artery: Large vessel that coursed to the apex. The proximal and mid vessel had serial 20% lesions. The distal vessel had 100% occlusion just before the apex. The vessel was 1.75 mm in this location.  The diagonal Heman Que was small in caliber and patent with mild diffuse disease.  Circumflex Artery: Large caliber vessel with large first obtuse marginal Mazella Deen. There is a 30% stenosis in the proximal portion of the obtuse marginal Maximilian Tallo. The distal segment of the OM Jennfier Abdulla is 100% occluded.  Right Coronary Artery: Large, dominant vessel with 40% proximal stenosis, long tubular 40% mid stenosis, serial 25% distal stenoses. The PDA is very small caliber and has diffuse 80% stenosis. The Posterolateral Raymundo Rout is small in caliber and has diffuse 50% stenosis.  Left Ventricular Angiogram: Deferred.  Impression:  1. Triple vessel CAD with occlusion of the apical LAD and distal portion of the first obtuse marginal Nykiah Ma. No focal lesions for PCI  2. NSTEMI secondary to above.  Recommendations: Will continue medical management with aggressive risk factor reduction. He will need to stop smoking. We will continue statin, beta blocker. Will continue ASA and will load with Plavix. Will check echo to assess LVEF.  Complications: None. The patient tolerated the procedure well.   03/2012 echo  LVEF 40-45%, mild LVH, multiple WMAs, grade II diastolic dysfunction,   09/2013 Carotid US  Bilateral 1-39% ICA stenosis, patent antegrade vertebrals  02/2014 Lexiscan MPI IMPRESSION: 1. Inferior wall infarct extending from apex to  base. No reversible ischemia.  2. Severe inferior wall hypokinesis.  3. Left ventricular ejection fraction 33%  4. High-risk stress test findings based upon extent of myocardial scar and severely reduced left ventricular systolic function.  11/2013 Echo Study Conclusions  - Left ventricle: The cavity size was mildly dilated. Wall thickness was increased in a pattern of mild LVH. Systolic function was normal. The estimated ejection fraction was approximately 55%. Features are consistent with a pseudonormal left ventricular filling pattern, with concomitant abnormal relaxation and increased filling pressure (grade 2 diastolic dysfunction). Doppler parameters are consistent with high ventricular filling pressure. - Regional wall motion abnormality: Moderate hypokinesis of the basal inferior and mid inferolateral myocardium; mild hypokinesis of the mid inferior, basal-mid anterolateral, and apical lateral myocardium. - Aortic valve: Mildly thickened leaflets. - Mitral valve: Mildly thickened leaflets . There was trivial regurgitation. - Tricuspid valve: There was trivial regurgitation. - Pulmonic valve: There was trivial regurgitation.   04/2015 [facility] Cath (cannot find actual report in Careeverywhere. Results referred to in clinic note) Left Heart Cath done on 12/08 which showed similar disease when compared to [facility] report. Documentation mentioned distal, small disease that is causing his symptoms. No noticeable large defects that could be intervened upon. So he was medically optimized and discharged home on Imdur 15   06/2016 cath  Prox RCA lesion, 100 %stenosed.  Prox LAD to Mid LAD lesion, 55 %stenosed.  Hemodynamic findings consistent with severe pulmonary hypertension.   Severely elevated right heart pressures with severe pulmonary hypertension.  Two  vessel CAD with a calcified proximal LAD with 55% proximal stenosis; a  normal left circumflex coronary artery; and total occlusion of the proximal RCA with extensive left-to-right collateralization.  RECOMMENDATION: The patient will undergo dialysis immediately following his cardiac catheterization.  Plan initial medical therapy for CAD.  If LAD lesion progresses, the patient may require atherectomy due to the calcified stenosis.   Assessment and Plan  1. CAD/ICM/Chronic systolic heart failure - no current symptoms, continue current meds.   2. HL  - change to atorvastatin 80mg  daily in setting of known CAD, he now has insurance and should be able to get   3. HTN  - continue current  meds , at goal  4. PAD  - follow with vascular   5. ESRD - compliant with HD, cannot tolerate long sessions due to muscle cramps. We will give metolazone 2.5mg  prn, continue lasix 120mg  daily       Antoine Poche, M.D.

## 2016-08-08 DEATH — deceased

## 2016-09-21 ENCOUNTER — Ambulatory Visit: Payer: Medicare Other | Admitting: Family

## 2016-09-21 ENCOUNTER — Encounter (HOSPITAL_COMMUNITY): Payer: Medicare Other
